# Patient Record
Sex: Male | Born: 1940 | Race: White | Hispanic: No | Smoking: Former smoker
Health system: Southern US, Community
[De-identification: ages and names within clinical notes are randomized; demographics above are authoritative.]

## PROBLEM LIST (undated history)

## (undated) DIAGNOSIS — N486 Induration penis plastica: Secondary | ICD-10-CM

## (undated) DIAGNOSIS — R9431 Abnormal electrocardiogram [ECG] [EKG]: Secondary | ICD-10-CM

## (undated) DIAGNOSIS — R634 Abnormal weight loss: Secondary | ICD-10-CM

## (undated) DIAGNOSIS — Z923 Personal history of irradiation: Secondary | ICD-10-CM

## (undated) DIAGNOSIS — R350 Frequency of micturition: Secondary | ICD-10-CM

## (undated) DIAGNOSIS — C61 Malignant neoplasm of prostate: Secondary | ICD-10-CM

## (undated) DIAGNOSIS — R351 Nocturia: Secondary | ICD-10-CM

## (undated) DIAGNOSIS — G4730 Sleep apnea, unspecified: Secondary | ICD-10-CM

## (undated) DIAGNOSIS — H269 Unspecified cataract: Secondary | ICD-10-CM

## (undated) DIAGNOSIS — R3912 Poor urinary stream: Secondary | ICD-10-CM

## (undated) HISTORY — PX: OTHER SURGICAL HISTORY: SHX169

## (undated) HISTORY — PX: UVULOPALATOPHARYNGOPLASTY: SHX827

## (undated) HISTORY — DX: Personal history of irradiation: Z92.3

## (undated) HISTORY — DX: Sleep apnea, unspecified: G47.30

## (undated) HISTORY — DX: Frequency of micturition: R35.0

## (undated) HISTORY — DX: Induration penis plastica: N48.6

## (undated) MED FILL — Dexamethasone Sodium Phosphate Inj 100 MG/10ML: INTRAMUSCULAR | Qty: 1 | Status: AC

## (undated) MED FILL — Fosaprepitant Dimeglumine For IV Infusion 150 MG (Base Eq): INTRAVENOUS | Qty: 5 | Status: AC

---

## 1990-10-18 HISTORY — PX: CHOLECYSTECTOMY: SHX55

## 2002-11-26 ENCOUNTER — Encounter: Payer: Self-pay | Admitting: Family Medicine

## 2002-11-26 ENCOUNTER — Ambulatory Visit (HOSPITAL_COMMUNITY): Admission: RE | Admit: 2002-11-26 | Discharge: 2002-11-26 | Payer: Self-pay | Admitting: Family Medicine

## 2010-12-09 ENCOUNTER — Inpatient Hospital Stay: Payer: Self-pay | Admitting: Internal Medicine

## 2011-11-03 ENCOUNTER — Encounter: Payer: Self-pay | Admitting: *Deleted

## 2011-11-03 NOTE — Progress Notes (Signed)
Jared Mullen is interested in Brachytherapy.  Married  Retired  [employer]

## 2011-11-04 ENCOUNTER — Encounter: Payer: Self-pay | Admitting: Radiation Oncology

## 2011-11-04 ENCOUNTER — Ambulatory Visit
Admission: RE | Admit: 2011-11-04 | Discharge: 2011-11-04 | Disposition: A | Discharge status: Home / Self Care | Payer: Medicare Other | Source: Ambulatory Visit | Attending: Radiation Oncology | Admitting: Radiation Oncology

## 2011-11-04 VITALS — BP 144/88 | HR 81 | Temp 96.8°F | Resp 18 | Ht 70.0 in | Wt 202.0 lb

## 2011-11-04 DIAGNOSIS — Z801 Family history of malignant neoplasm of trachea, bronchus and lung: Secondary | ICD-10-CM | POA: Insufficient documentation

## 2011-11-04 DIAGNOSIS — C61 Malignant neoplasm of prostate: Secondary | ICD-10-CM

## 2011-11-04 DIAGNOSIS — Z510 Encounter for antineoplastic radiation therapy: Secondary | ICD-10-CM | POA: Insufficient documentation

## 2011-11-04 DIAGNOSIS — Z8546 Personal history of malignant neoplasm of prostate: Secondary | ICD-10-CM

## 2011-11-04 DIAGNOSIS — Z803 Family history of malignant neoplasm of breast: Secondary | ICD-10-CM | POA: Insufficient documentation

## 2011-11-04 DIAGNOSIS — G4730 Sleep apnea, unspecified: Secondary | ICD-10-CM | POA: Insufficient documentation

## 2011-11-04 DIAGNOSIS — Z79899 Other long term (current) drug therapy: Secondary | ICD-10-CM | POA: Insufficient documentation

## 2011-11-04 NOTE — Progress Notes (Signed)
Please see the Nurse Progress Note in the MD Initial Consult Encounter for this patient. 

## 2011-11-04 NOTE — Progress Notes (Signed)
Patient presents to the clinic today accompanied by his wife for an initial consultation with Dr. Dayton Scrape reference prostate cancer. Patient is alert and oriented to person, place, and time. No distress noted. Steady gait noted. Pleasant affect noted. Patient reports intermittent sharp left lower abdominal pain 1 on a scale of 0-10 that radiated to his back on a daily basis. Patient has no other complaints. Patient denies nausea, vomiting, diarrhea, headache or double vision. Patient denies significant weight loss. Reported all findings to Dr. Dayton Scrape.    NKDA No pacemaker No radiation therapy in the past

## 2011-11-04 NOTE — Progress Notes (Signed)
Encounter addended by: Maryln Gottron, MD on: 11/04/2011 12:29 PM<BR>     Documentation filed: Normajean Glasgow VN

## 2011-11-04 NOTE — Progress Notes (Signed)
IPSS score of 6 noted.

## 2011-11-04 NOTE — Progress Notes (Signed)
[facility] Radiation Oncology NEW PATIENT EVALUATION  Name: Jared Mullen MRN: [id number]  Date: 11/04/2011  DOB: 10-19-1940  Status: outpatient   CC:   Jared Matar, MD  Dr. 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 male who is seen today for the courtesy of Dr. 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 His primary care physician, Dr. Sherrie Mullen, referred to Dr. Retta Mullen for further evaluation. Dr. Retta Mullen noted prominence of his right lobe. On 09/30/2011 he underwent ultrasound-guided biopsies. He was found to have adenocarcinoma with a Gleason score of 7 (3+4) involving 50% of one core from the right lateral base, 30% of one core from the right lateral mid gland and 5% of one core from the right mid gland. He also had Gleason 6 (3+3) involving 5% of one core from the right base. His gland volume was 32.5 cc and prosthetic length 4.13 cm. He is doing well from a GU and GI standpoint although he does report vague left abdominal discomfort on occasion. His I PSS score 6. He does report a weak urinary stream. He was given Jalyn samples the through Dr. Retta Mullen. He does have erectile dysfunction and a history of what sounds like Peyronie's disease.    PREVIOUS RADIATION THERAPY: No   PAST MEDICAL HISTORY:  has a past medical history of Sleep apnea; Libido, decreased; Urinary frequency; Urinary stream slowing; Urinary urgency; S/P biopsy (09/30/11 ); and Peyronie's disease.     PAST SURGICAL HISTORY:  Past Surgical History  Procedure Date  . Cholecystectomy   . Biospy 09/30/2011    TRUS/Bx Prostate - 5/12/biopsies positive for cnacer,  glandular size 32.5 cc     FAMILY HISTORY: family history includes Breast cancer in his sister; Cancer in his brother and sisters; Heart disease in his father; Kidney disease in his brother; and Lung cancer in his brother and sister. his father.a heart attack at age 48, in his mother died from "old age" at age 12.   SOCIAL HISTORY:  reports that he quit smoking about 31 years ago. His smoking use included Cigarettes. He has a 56 pack-year smoking history. He has never used smokeless tobacco. He reports that he does not drink alcohol or use illicit drugs. Married, 3 children. Retired Psychologist, occupational.   ALLERGIES: Review of patient's allergies indicates no known allergies.   MEDICATIONS:  Current Outpatient Prescriptions  Medication Sig Dispense Refill  . Dutasteride-Tamsulosin HCl (JALYN) 0.5-0.4 MG CAPS Take by mouth.          REVIEW OF SYSTEMS:  Pertinent items are noted in HPI.    PHYSICAL EXAM:  height is 5\' 10"  (1.778 m) and weight is 202 lb (91.627 kg). His oral temperature is 96.8 F (36 C). His blood pressure is 144/88 and his pulse is 81. His respiration is 18.   Head and neck examination: Grossly unremarkable. Nodes: Without palpable cervical or supraclavicular lymphadenopathy. Chest: Lungs clear. Back: Without spinal or CVA tenderness. Heart: Regular in rhythm. Abdomen: Without masses organomegaly. Genitalia: Grossly unremarkable. Rectal: The prostate gland is normal in size and there is prominence of his right lobe towards the base. There is no discrete induration or nodularity. Extremities:  Without edema. Neurologic examination: Grossly nonfocal.   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 I explained to the patient and his wife  that his prognosis is related to his stage, PSA level, and Gleason score. His stage and PSA level are favorable while his Gleason score of 7 is of intermediate favorability. We discussed surgery versus observation versus radiation therapy. His radiation therapy options include 5 weeks of external beam followed by seed implantation or 8 weeks of external beam/IMRT. He is not a good candidate for seed implantation alone in view of his multicentric Gleason 7 disease with heavy involvement of his right base. After a lengthy discussion he is most interested in 5 weeks of external beam radiation therapy followed by seed implantation. I think this would be a good choice for him. We discussed the potential acute and late toxicities of radiation therapy and he wishes to proceed as outlined. I'll need to have Dr. Retta Mullen placed 3 gold markers for prostate localization during his external beam. We will then get him set up for simulation/treatment planning. Lastly, he has not had colonoscopy, and I told him that he can have this performed before starting his radiation therapy. He will contact Dr. Sherrie Mullen, his primary care physician for scheduling of his colonoscopy.  PLAN: As above. Consent is signed today. He will return for simulation/treatment planning after placement of 3 gold seed markers by Dr. Retta Mullen. He can have colonoscopy a prior to initiation of radiation therapy. A CT arch study will be performed at the time of his initial simulation.   I spent 60 minutes minutes face to face with the patient and more than 50% of that time was spent in counseling and/or coordination of care.

## 2011-11-04 NOTE — Progress Notes (Signed)
Complete NUTRITION RISK SCREEN worksheet without concerns submitted to Zenovia Jarred, RD. Also, complete PATIENT MEASURE OF DISTRESS worksheet with a score of 0 turned into social work.

## 2011-11-05 ENCOUNTER — Telehealth: Payer: Self-pay | Admitting: *Deleted

## 2011-11-05 NOTE — Progress Notes (Signed)
Encounter addended by: Ardell Isaacs, RN on: 11/05/2011 11:42 AM<BR>     Documentation filed: Charges VN, Inpatient Patient Education

## 2011-11-11 LAB — HM COLONOSCOPY

## 2011-11-18 DIAGNOSIS — Z86010 Personal history of colonic polyps: Secondary | ICD-10-CM | POA: Insufficient documentation

## 2011-12-10 ENCOUNTER — Telehealth: Payer: Self-pay | Admitting: *Deleted

## 2011-12-10 NOTE — Telephone Encounter (Signed)
xxx

## 2011-12-13 ENCOUNTER — Ambulatory Visit
Admission: RE | Admit: 2011-12-13 | Discharge: 2011-12-13 | Disposition: A | Discharge status: Home / Self Care | Payer: Medicare Other | Source: Ambulatory Visit | Attending: Radiation Oncology | Admitting: Radiation Oncology

## 2011-12-13 DIAGNOSIS — C61 Malignant neoplasm of prostate: Secondary | ICD-10-CM

## 2011-12-13 NOTE — Telephone Encounter (Signed)
xxx

## 2011-12-13 NOTE — Progress Notes (Signed)
Met with patient to discuss RO billing.  Rad Tx: [id number] Extrl Beam  Attending Rad: Dr. Dayton Scrape   Dx: 185 malignant neoplasm of prostate

## 2011-12-13 NOTE — Progress Notes (Signed)
CT simulation/treatment planning note:  The patient underwent CT simulation/treatment planning today in the management of his course of the prostate. An alpha cradle was constructed for immobilization. A red rubber catheter was placed within the rectal vault. He was then catheterized and contrast instilled of the bladder/urethra. He was then scanned. I chose and isocenter within the center of the prostate. I performed a CT arch study is showing a prostate volume of 36.2 cc with a prosthetic length of 3.8 cm. His arch is open. This closely correlates with Dr. Lenoria Chime measurements. He is now ready for 3-D simulation. I prescribing 4,500 cGy in 25 sessions utilizing a 4 field technique, 15 MV photons. This was followed by a prostate seed implant to deliver a further 11,000 cGy with I-125 seeds. He'll undergo daily KV imaging, setting up to 3 gold seeds along with a weekly cone beam CT scan to assess his bladder filling.

## 2011-12-13 NOTE — Telephone Encounter (Signed)
xxxx 

## 2011-12-14 ENCOUNTER — Encounter: Payer: Self-pay | Admitting: Radiation Oncology

## 2011-12-14 NOTE — Progress Notes (Signed)
3-D simulation note:  The patient underwent 3-D simulation in the management of his carcinoma prostate. He was set up a 4 field technique. 4 separate multileaf collimators were designed to shield normal surrounding structures. Dose volume histograms were obtained for the prostate, seminal vesicles in addition to avoidance structures including the rectum, bladder and femoral heads. I'm prescribing 4500 cGy in 25 sessions utilizing 15 MV photons. He'll undergo daily image guidance setting up to 3 gold seeds and also a weekly cone beam CT scan to assess his bladder filling. He is to be treated with a comfortably full bladder.

## 2011-12-16 ENCOUNTER — Encounter: Payer: Self-pay | Admitting: *Deleted

## 2011-12-17 ENCOUNTER — Other Ambulatory Visit: Payer: Self-pay | Admitting: Urology

## 2011-12-20 ENCOUNTER — Ambulatory Visit
Admission: RE | Admit: 2011-12-20 | Discharge: 2011-12-20 | Disposition: A | Discharge status: Home / Self Care | Payer: Medicare Other | Source: Ambulatory Visit | Attending: Radiation Oncology | Admitting: Radiation Oncology

## 2011-12-20 ENCOUNTER — Encounter: Payer: Self-pay | Admitting: Radiation Oncology

## 2011-12-20 NOTE — Progress Notes (Signed)
Simulation verification note: The patient underwent simulation verification for treatment to his prostate.  His isocenter is in good position and the multileaf collimators contoured the treatment volume appropriately. 

## 2011-12-21 ENCOUNTER — Ambulatory Visit
Admission: RE | Admit: 2011-12-21 | Discharge: 2011-12-21 | Disposition: A | Discharge status: Home / Self Care | Payer: Medicare Other | Source: Ambulatory Visit | Attending: Radiation Oncology | Admitting: Radiation Oncology

## 2011-12-21 ENCOUNTER — Ambulatory Visit: Payer: Medicare Other

## 2011-12-22 ENCOUNTER — Ambulatory Visit
Admission: RE | Admit: 2011-12-22 | Discharge: 2011-12-22 | Disposition: A | Discharge status: Home / Self Care | Payer: Medicare Other | Source: Ambulatory Visit | Attending: Radiation Oncology | Admitting: Radiation Oncology

## 2011-12-22 DIAGNOSIS — C61 Malignant neoplasm of prostate: Secondary | ICD-10-CM

## 2011-12-22 NOTE — Progress Notes (Signed)
Received patient and his wife in the clinic today for post sim education with Sam, Charity fundraiser. Patient is alert and oriented to person, place, and time. No distress noted. Steady gait noted. Pleasant affect noted. Patient denies pain. Oriented patient to staff and routine of the clinic. Provided patient with RADIATION THERAPY AND YOU handbook then, reviewed pertinent information. Oriented patient to potential side effects and management. Provided patient with this writers business card and encouraged to call with needs. All questions answered. Patient verbalized understanding of all things reviewed.

## 2011-12-23 ENCOUNTER — Ambulatory Visit
Admission: RE | Admit: 2011-12-23 | Discharge: 2011-12-23 | Disposition: A | Discharge status: Home / Self Care | Payer: Medicare Other | Source: Ambulatory Visit | Attending: Radiation Oncology | Admitting: Radiation Oncology

## 2011-12-24 ENCOUNTER — Ambulatory Visit
Admission: RE | Admit: 2011-12-24 | Discharge: 2011-12-24 | Disposition: A | Discharge status: Home / Self Care | Payer: Medicare Other | Source: Ambulatory Visit | Attending: Radiation Oncology | Admitting: Radiation Oncology

## 2011-12-27 ENCOUNTER — Ambulatory Visit
Admission: RE | Admit: 2011-12-27 | Discharge: 2011-12-27 | Disposition: A | Discharge status: Home / Self Care | Payer: Medicare Other | Source: Ambulatory Visit | Attending: Radiation Oncology | Admitting: Radiation Oncology

## 2011-12-27 ENCOUNTER — Encounter: Payer: Self-pay | Admitting: Radiation Oncology

## 2011-12-27 VITALS — BP 154/93 | HR 68 | Resp 18 | Wt 197.3 lb

## 2011-12-27 DIAGNOSIS — C61 Malignant neoplasm of prostate: Secondary | ICD-10-CM

## 2011-12-27 NOTE — Progress Notes (Signed)
Patient presents to the clinic today for under treat visit with Dr. Dayton Scrape. Patient is alert and oriented to person, place, and time. No distress noted. Steady gait noted. Pleasant affect noted. Patient denies pain at this time. Patient denies hematuria. Patient denies nocturia. Patient denies burning with urination. Patient has no complaints at this time. Reported all findings to Dr. Dayton Scrape.

## 2011-12-27 NOTE — Progress Notes (Signed)
Weekly Management Note:  Site:Prostate Current Dose:  900  cGy Projected Dose: 4500  cGy  Narrative: The patient is seen today for routine under treatment assessment. CBCT/MVCT images/port films were reviewed. The chart was reviewed.   Bladder filling is excellent. No GU or GI difficulties.  Physical Examination:  Filed Vitals:   12/27/11 1721  BP: 154/93  Pulse: 68  Resp: 18  .  Weight: 197 lb 4.8 oz (89.495 kg). No change.  Impression: Tolerating radiation therapy well.  Plan: Continue radiation therapy as planned.

## 2011-12-28 ENCOUNTER — Ambulatory Visit
Admission: RE | Admit: 2011-12-28 | Discharge: 2011-12-28 | Disposition: A | Discharge status: Home / Self Care | Payer: Medicare Other | Source: Ambulatory Visit | Attending: Radiation Oncology | Admitting: Radiation Oncology

## 2011-12-29 ENCOUNTER — Ambulatory Visit
Admission: RE | Admit: 2011-12-29 | Discharge: 2011-12-29 | Disposition: A | Discharge status: Home / Self Care | Payer: Medicare Other | Source: Ambulatory Visit | Attending: Radiation Oncology | Admitting: Radiation Oncology

## 2011-12-29 LAB — HM COLONOSCOPY

## 2011-12-30 ENCOUNTER — Ambulatory Visit
Admission: RE | Admit: 2011-12-30 | Discharge: 2011-12-30 | Disposition: A | Discharge status: Home / Self Care | Payer: Medicare Other | Source: Ambulatory Visit | Attending: Radiation Oncology | Admitting: Radiation Oncology

## 2011-12-31 ENCOUNTER — Ambulatory Visit
Admission: RE | Admit: 2011-12-31 | Discharge: 2011-12-31 | Disposition: A | Discharge status: Home / Self Care | Payer: Medicare Other | Source: Ambulatory Visit | Attending: Radiation Oncology | Admitting: Radiation Oncology

## 2012-01-03 ENCOUNTER — Ambulatory Visit
Admission: RE | Admit: 2012-01-03 | Discharge: 2012-01-03 | Disposition: A | Discharge status: Home / Self Care | Payer: Medicare Other | Source: Ambulatory Visit | Attending: Radiation Oncology | Admitting: Radiation Oncology

## 2012-01-03 ENCOUNTER — Encounter: Payer: Self-pay | Admitting: Radiation Oncology

## 2012-01-03 VITALS — BP 144/92 | HR 70 | Resp 18 | Wt 197.7 lb

## 2012-01-03 DIAGNOSIS — C61 Malignant neoplasm of prostate: Secondary | ICD-10-CM

## 2012-01-03 NOTE — Progress Notes (Signed)
Patient presents to the clinic today for under treat visit with Dr. Dayton Scrape. Patient is alert and oriented to person, place, and time. No distress noted. Steady gait noted. Pleasant affect noted. Patient denies pain at this time. Patient reports that last Thursday and Friday night "were rough." patient reports he had the urge to void and could not. Also, patient reports that began burning with urination. Patient reports he is voiding without difficulty now but the burning has yet to resolve. Patient denies nausea, vomiting, headache, dizziness or diarrhea. Reported all findings to Dr. Dayton Scrape.

## 2012-01-03 NOTE — Progress Notes (Signed)
Weekly Management Note:  Site:Prostate Current Dose:  1800  cGy Projected Dose: 4500  cGy  Narrative: The patient is seen today for routine under treatment assessment. CBCT/MVCT images/port films were reviewed. The chart was reviewed.   Bladder filling is excellent. He had dysuria this past Thursday night with some slowing of the stream, but he is now back to his baseline. No GI problems. His implant is scheduled for April 24.  Physical Examination:  Filed Vitals:   01/03/12 1712  BP: 144/92  Pulse: 70  Resp: 18  .  Weight: 197 lb 11.2 oz (89.676 kg). No change.  Impression: Tolerating radiation therapy well.  Plan: Continue radiation therapy as planned.

## 2012-01-04 ENCOUNTER — Ambulatory Visit
Admission: RE | Admit: 2012-01-04 | Discharge: 2012-01-04 | Disposition: A | Discharge status: Home / Self Care | Payer: Medicare Other | Source: Ambulatory Visit | Attending: Radiation Oncology | Admitting: Radiation Oncology

## 2012-01-05 ENCOUNTER — Ambulatory Visit
Admission: RE | Admit: 2012-01-05 | Discharge: 2012-01-05 | Disposition: A | Discharge status: Home / Self Care | Payer: Medicare Other | Source: Ambulatory Visit | Attending: Radiation Oncology | Admitting: Radiation Oncology

## 2012-01-06 ENCOUNTER — Ambulatory Visit
Admission: RE | Admit: 2012-01-06 | Discharge: 2012-01-06 | Disposition: A | Discharge status: Home / Self Care | Payer: Medicare Other | Source: Ambulatory Visit | Attending: Radiation Oncology | Admitting: Radiation Oncology

## 2012-01-07 ENCOUNTER — Other Ambulatory Visit: Payer: Self-pay

## 2012-01-07 ENCOUNTER — Other Ambulatory Visit: Payer: Self-pay | Admitting: Urology

## 2012-01-07 ENCOUNTER — Ambulatory Visit (HOSPITAL_BASED_OUTPATIENT_CLINIC_OR_DEPARTMENT_OTHER)
Admission: RE | Admit: 2012-01-07 | Discharge: 2012-01-07 | Disposition: A | Discharge status: Home / Self Care | Payer: Medicare Other | Source: Ambulatory Visit | Attending: Urology | Admitting: Urology

## 2012-01-07 ENCOUNTER — Ambulatory Visit
Admission: RE | Admit: 2012-01-07 | Discharge: 2012-01-07 | Disposition: A | Discharge status: Home / Self Care | Payer: Medicare Other | Source: Ambulatory Visit | Attending: Radiation Oncology | Admitting: Radiation Oncology

## 2012-01-07 ENCOUNTER — Ambulatory Visit
Admission: RE | Admit: 2012-01-07 | Discharge: 2012-01-07 | Disposition: A | Discharge status: Home / Self Care | Payer: Medicare Other | Source: Ambulatory Visit | Attending: Urology | Admitting: Urology

## 2012-01-07 DIAGNOSIS — C61 Malignant neoplasm of prostate: Secondary | ICD-10-CM | POA: Insufficient documentation

## 2012-01-07 DIAGNOSIS — Z01818 Encounter for other preprocedural examination: Secondary | ICD-10-CM

## 2012-01-10 ENCOUNTER — Ambulatory Visit
Admission: RE | Admit: 2012-01-10 | Discharge: 2012-01-10 | Disposition: A | Discharge status: Home / Self Care | Payer: Medicare Other | Source: Ambulatory Visit | Attending: Radiation Oncology | Admitting: Radiation Oncology

## 2012-01-10 ENCOUNTER — Encounter: Payer: Self-pay | Admitting: Radiation Oncology

## 2012-01-10 VITALS — BP 150/89 | HR 64 | Resp 18 | Wt 198.2 lb

## 2012-01-10 DIAGNOSIS — C61 Malignant neoplasm of prostate: Secondary | ICD-10-CM

## 2012-01-10 NOTE — Progress Notes (Signed)
Patient presents to the clinic today unaccompanied for under treat visit with Dr. Dayton Scrape. Patient is alert and oriented to person, place, and time. No distress noted. Steady gait noted. Pleasant affect noted. Patient denies pain at this time. Patient denies burning with urination or hematuria. Patient reports he gets up on average no more than once to void. Patient questions if he empties his bladder completely. Patient denies nausea, vomiting, or headache. Patient reports loose bowel movement about twice per day. Reported all findings to Dr. Dayton Scrape.

## 2012-01-10 NOTE — Progress Notes (Signed)
Weekly Management Note:  Site:Prostate Current Dose:  2700  cGy Projected Dose: 4500  cGy  Narrative: The patient is seen today for routine under treatment assessment. CBCT/MVCT images/port films were reviewed. The chart was reviewed. Good  bladder filling today. No GU or GI difficulties although he wonders if he is completely emptying his bladder. His pretreatment I PSS score was 6. He only has nocturia x1.  Physical Examination:  Filed Vitals:   01/10/12 1650  BP: 150/89  Pulse: 64  Resp: 18  .  Weight: 198 lb 3.2 oz (89.903 kg). No change.  Impression: Tolerating radiation therapy well.  Plan: Continue radiation therapy as planned. Seed implant boost on April 24.

## 2012-01-11 ENCOUNTER — Ambulatory Visit
Admission: RE | Admit: 2012-01-11 | Discharge: 2012-01-11 | Disposition: A | Discharge status: Home / Self Care | Payer: Medicare Other | Source: Ambulatory Visit | Attending: Radiation Oncology | Admitting: Radiation Oncology

## 2012-01-12 ENCOUNTER — Ambulatory Visit
Admission: RE | Admit: 2012-01-12 | Discharge: 2012-01-12 | Disposition: A | Discharge status: Home / Self Care | Payer: Medicare Other | Source: Ambulatory Visit | Attending: Radiation Oncology | Admitting: Radiation Oncology

## 2012-01-13 ENCOUNTER — Ambulatory Visit
Admission: RE | Admit: 2012-01-13 | Discharge: 2012-01-13 | Disposition: A | Discharge status: Home / Self Care | Payer: Medicare Other | Source: Ambulatory Visit | Attending: Radiation Oncology | Admitting: Radiation Oncology

## 2012-01-14 ENCOUNTER — Ambulatory Visit
Admission: RE | Admit: 2012-01-14 | Discharge: 2012-01-14 | Disposition: A | Discharge status: Home / Self Care | Payer: Medicare Other | Source: Ambulatory Visit | Attending: Radiation Oncology | Admitting: Radiation Oncology

## 2012-01-17 ENCOUNTER — Ambulatory Visit
Admission: RE | Admit: 2012-01-17 | Discharge: 2012-01-17 | Disposition: A | Discharge status: Home / Self Care | Payer: Medicare Other | Source: Ambulatory Visit | Attending: Radiation Oncology | Admitting: Radiation Oncology

## 2012-01-17 ENCOUNTER — Encounter: Payer: Self-pay | Admitting: Radiation Oncology

## 2012-01-17 DIAGNOSIS — C61 Malignant neoplasm of prostate: Secondary | ICD-10-CM

## 2012-01-17 NOTE — Progress Notes (Signed)
HERE TODAY FOR PUT.  HAS SOME BURNING WITH URINATION BUT NOT ENOUGH TO NEED MEDICATION.

## 2012-01-17 NOTE — Progress Notes (Signed)
   Weekly Management Note, prostate cancer Current Dose:  3600 cGy  Projected Dose:  4500 cGy plus a seed boost  Narrative:  The patient presents for routine under treatment assessment.  CBCT/MVCT images/Port film x-rays were reviewed.  The chart was checked. He is doing well. He has a little bit of dysuria. He has nocturia once per night.  Physical Findings: Weight:  . In no acute distress, well-appearing  Impression:  The patient is tolerating radiotherapy.  Plan:  Continue radiotherapy as planned.

## 2012-01-18 ENCOUNTER — Ambulatory Visit
Admission: RE | Admit: 2012-01-18 | Discharge: 2012-01-18 | Disposition: A | Discharge status: Home / Self Care | Payer: Medicare Other | Source: Ambulatory Visit | Attending: Radiation Oncology | Admitting: Radiation Oncology

## 2012-01-19 ENCOUNTER — Ambulatory Visit
Admission: RE | Admit: 2012-01-19 | Discharge: 2012-01-19 | Disposition: A | Discharge status: Home / Self Care | Payer: Medicare Other | Source: Ambulatory Visit | Attending: Radiation Oncology | Admitting: Radiation Oncology

## 2012-01-20 ENCOUNTER — Ambulatory Visit
Admission: RE | Admit: 2012-01-20 | Discharge: 2012-01-20 | Disposition: A | Discharge status: Home / Self Care | Payer: Medicare Other | Source: Ambulatory Visit | Attending: Radiation Oncology | Admitting: Radiation Oncology

## 2012-01-21 ENCOUNTER — Ambulatory Visit
Admission: RE | Admit: 2012-01-21 | Discharge: 2012-01-21 | Disposition: A | Discharge status: Home / Self Care | Payer: Medicare Other | Source: Ambulatory Visit | Attending: Radiation Oncology | Admitting: Radiation Oncology

## 2012-01-24 ENCOUNTER — Encounter: Payer: Self-pay | Admitting: Radiation Oncology

## 2012-01-24 ENCOUNTER — Ambulatory Visit
Admission: RE | Admit: 2012-01-24 | Discharge: 2012-01-24 | Disposition: A | Discharge status: Home / Self Care | Payer: Medicare Other | Source: Ambulatory Visit | Attending: Radiation Oncology | Admitting: Radiation Oncology

## 2012-01-24 VITALS — BP 161/90 | HR 58 | Resp 18 | Wt 198.4 lb

## 2012-01-24 DIAGNOSIS — C61 Malignant neoplasm of prostate: Secondary | ICD-10-CM

## 2012-01-24 NOTE — Progress Notes (Signed)
Patient presents to the clinic today unaccompanied for under treat visit with Dr. Dayton Scrape. Patient is alert and oriented to person, place, and time. No distress noted. Steady gait noted. Pleasant affect noted. Patient denies pain at this time. Patient reports that he is bloated. Patient complains of gas. Patient reports he feels tired now after his daily walks. Patient reports that he feels like his hand are swollen. Patient reports that Friday night he got up five times to void but last night none at all. Patient denies seeing blood in his urine. Patient reports his stools are loose but, denies having diarrhea. Reported all findings to Dr. Dayton Scrape.

## 2012-01-24 NOTE — Progress Notes (Signed)
Weekly Management Note:  Site:Prostate Current Dose:  4500  cGy Projected Dose: 4500  cGy  Narrative: The patient is seen today for routine under treatment assessment. CBCT/MVCT images/port films were reviewed. The chart was reviewed.   Bladder filling satisfactory. Doing well from a GU and GI standpoint.  Physical Examination:  Filed Vitals:   01/24/12 1757  BP: 161/90  Pulse: 58  Resp: 18  .  Weight: 198 lb 6.4 oz (89.994 kg). No change.  Impression: Tolerated well.   Plan: Prostate seed implant boost on April 24  with Dr. Retta Diones.

## 2012-01-24 NOTE — Progress Notes (Signed)
[facility] Radiation Oncology End of Treatment Note  Name:Jared Mullen  Date: 01/24/2012 ZOX:[id number] DOB:[birth year]   Status:outpatient    CC: Chelsea Aus, MD, MD    REFERRING PHYSICIAN: Dr. 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                        PLAN: Prostate seed implant boost with Dr. Retta Diones on April 24.

## 2012-02-01 ENCOUNTER — Telehealth: Payer: Self-pay | Admitting: *Deleted

## 2012-02-01 NOTE — Telephone Encounter (Signed)
Called patient to remind of appt. For 02-02-12, lvm for a return call.

## 2012-02-02 LAB — COMPREHENSIVE METABOLIC PANEL
BUN: 13 mg/dL (ref 6–23)
CO2: 31 mEq/L (ref 19–32)
Chloride: 104 mEq/L (ref 96–112)
Creatinine, Ser: 1.02 mg/dL (ref 0.50–1.35)
GFR calc non Af Amer: 71 mL/min — ABNORMAL LOW (ref >90)
Glucose, Bld: 88 mg/dL (ref 70–99)
Total Bilirubin: 0.8 mg/dL (ref 0.3–1.2)

## 2012-02-02 LAB — CBC
HCT: 43.5 % (ref 39.0–52.0)
Hemoglobin: 14.7 g/dL (ref 13.0–17.0)
MCV: 90.2 fL (ref 78.0–100.0)
RBC: 4.82 MIL/uL (ref 4.22–5.81)
WBC: 4.7 10*3/uL (ref 4.0–10.5)

## 2012-02-02 LAB — PROTIME-INR
INR: 1.02 (ref 0.00–1.49)
Prothrombin Time: 13.6 seconds (ref 11.6–15.2)

## 2012-02-08 ENCOUNTER — Encounter (HOSPITAL_BASED_OUTPATIENT_CLINIC_OR_DEPARTMENT_OTHER): Payer: Self-pay | Admitting: *Deleted

## 2012-02-08 ENCOUNTER — Telehealth: Payer: Self-pay | Admitting: *Deleted

## 2012-02-08 NOTE — Progress Notes (Signed)
NPO AFTER MN. ARRIVES AT 0915. CURRENT LAB RESULTS IN EPIC. CURRENT CXR AND EKG W/ CHART AND IN EPIC. WILL DO ONE FLEET ENEMA AM OF SURG.

## 2012-02-08 NOTE — H&P (Signed)
Urology History and Physical Exam  ZO:[id number] cancer  HPI: 71 year old male who presents now for brachytherapy to complete combination radiotherapy. His history is outlined below:  He originally presented in November 2012, referred by Dr. Sherrie Mustache for elevation of his PSA, found on his insurance physical. He had slight assymmetry of his prostate on exam, with the right lobe being more prominent than the left. He underwent TRUS/Bx on 09/30/2011. Glandular size was 32.5 cc. 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 Chipper Herb. He completed 25 treatments of external beam radiotherapy on 01/24/2012. He received 4500 cGy.   PMH: Past Medical History  Diagnosis Date  . Urinary frequency   . Peyronie's disease   . Prostate cancer dx 12-  2012  . Nocturia   . Weak urinary stream   . Cataract immature right eye     PSH: Past Surgical History  Procedure Date  . Biospy 09/30/2011- DONE IN OFFICE    TRUS/Bx Prostate - 5/12/biopsies positive for cnacer, glandular size 32.5 cc  . Cholecystectomy 1992    Allergies: No Known Allergies  Medications: No prescriptions prior to admission     Social History: History   Social History  . Marital Status: Married    Spouse Name: N/A    Number of Children: N/A  . Years of Education: N/A   Occupational History  . Not on file.   Social History Main Topics  . Smoking status: Former Smoker -- 2.0 packs/day for 28 years    Types: Cigarettes    Quit date: 10/18/1980  . Smokeless tobacco: Never Used  . Alcohol Use: No  . Drug Use: No  . Sexually Active:      Low libido   Other Topics Concern  . Not  on file   Social History Narrative  . No narrative on file    Family History: Family History  Problem Relation Age of Onset  . Heart disease Father   . Breast cancer Sister   . Cancer Sister     lung  . Lung cancer Brother   . Cancer Brother     bladder  . Lung cancer Sister   . Cancer Sister     breast  . Kidney disease Brother     Review of Systems Genitourinary, constitutional, skin, eye, otolaryngeal, hematologic/lymphatic, cardiovascular, pulmonary, endocrine, musculoskeletal, gastrointestinal, neurological and psychiatric system(s) were reviewed and pertinent findings if present are noted.  Genitourinary: urinary frequency, feelings of urinary urgency, nocturia, decreased libido and penile curvature, but no erectile dysfunction.  Gastrointestinal: nausea and abdominal pain.  Constitutional: night sweats.  Integumentary: skin rash/lesion and pruritus.  Musculoskeletal: back pain.    Physical Exam: @VITALS2 @ Constitutional: Well nourished and well developed . No acute distress.  ENT:. The ears and nose are normal in appearance.  Neck: The appearance of the neck is normal and no neck mass is present.  Pulmonary: No respiratory distress and normal respiratory rhythm and effort.  Cardiovascular: Heart rate and rhythm are normal . No peripheral edema.  Abdomen: The abdomen is soft and nontender. No masses are palpated. No CVA tenderness. No hernias  are palpable. No hepatosplenomegaly noted.  Rectal: Rectal exam demonstrates normal sphincter tone, the anus is normal on inspection., no tenderness and no masses. Estimated prostate size is 2+. Normal rectal tone, no rectal masses, prostate is smooth, symmetric and non-tender.  There is slight asymmetry of the prostate, with the right lobe being slightly larger than the left. There is no discrete nodularity, however. The prostate has no nodularity and is not tender. The left seminal vesicle is nonpalpable. The right seminal vesicle  is nonpalpable. The perineum is normal on inspection.  Genitourinary: Examination of the penis demonstrates no discharge, no masses, no lesions and a normal meatus. The penis is uncircumcised. The scrotum is without lesions. The right epididymis is palpably normal and non-tender. The left epididymis is palpably normal and non-tender. The right testis is palpably normal, non-tender and without masses. The left testis is normal, non-tender and without masses.   Studies:  No results found for this basename: HGB:2,WBC:2,PLT:2 in the last 72 hours  No results found for this basename: NA:2,K:2,CL:2,CO2:2,BUN:2,CREATININE:2,CALCIUM:2,MAGNESIUM:2,GFRNONAA:2,GFRAA:2 in the last 72 hours   No results found for this basename: PT:2,INR:2,APTT:2 in the last 72 hours   No components found with this basename: ABG:2    Assessment: adenocarcinoma prostate, clinical stage T2a, Gleason 3+4. He presents for brachytherapy to complete combination radiotherapy.  Plan: I-125 brachytherapy

## 2012-02-08 NOTE — Telephone Encounter (Signed)
CALLED PATIENT TO REMIND OF PROCEDURE FOR 02-09-12, LVM FOR A RETURN CALL

## 2012-02-09 ENCOUNTER — Ambulatory Visit (HOSPITAL_COMMUNITY): Payer: Medicare Other

## 2012-02-09 ENCOUNTER — Encounter (HOSPITAL_BASED_OUTPATIENT_CLINIC_OR_DEPARTMENT_OTHER): Payer: Self-pay | Admitting: Anesthesiology

## 2012-02-09 ENCOUNTER — Encounter (HOSPITAL_BASED_OUTPATIENT_CLINIC_OR_DEPARTMENT_OTHER)
Admission: RE | Disposition: A | Discharge status: Home / Self Care | Payer: Self-pay | Source: Ambulatory Visit | Attending: Urology

## 2012-02-09 ENCOUNTER — Ambulatory Visit (HOSPITAL_BASED_OUTPATIENT_CLINIC_OR_DEPARTMENT_OTHER)
Admission: RE | Admit: 2012-02-09 | Discharge: 2012-02-09 | Disposition: A | Discharge status: Home / Self Care | Payer: Medicare Other | Source: Ambulatory Visit | Attending: Urology | Admitting: Urology

## 2012-02-09 ENCOUNTER — Encounter: Payer: Self-pay | Admitting: Radiation Oncology

## 2012-02-09 ENCOUNTER — Ambulatory Visit (HOSPITAL_BASED_OUTPATIENT_CLINIC_OR_DEPARTMENT_OTHER): Payer: Medicare Other | Admitting: Anesthesiology

## 2012-02-09 ENCOUNTER — Encounter (HOSPITAL_BASED_OUTPATIENT_CLINIC_OR_DEPARTMENT_OTHER): Payer: Self-pay | Admitting: *Deleted

## 2012-02-09 DIAGNOSIS — C61 Malignant neoplasm of prostate: Secondary | ICD-10-CM

## 2012-02-09 HISTORY — DX: Nocturia: R35.1

## 2012-02-09 HISTORY — DX: Unspecified cataract: H26.9

## 2012-02-09 HISTORY — DX: Malignant neoplasm of prostate: C61

## 2012-02-09 HISTORY — PX: RADIOACTIVE SEED IMPLANT: SHX5150

## 2012-02-09 HISTORY — DX: Poor urinary stream: R39.12

## 2012-02-09 SURGERY — INSERTION, RADIATION SOURCE, PROSTATE
Anesthesia: General | Site: Prostate | Wound class: Clean Contaminated

## 2012-02-09 MED ORDER — HYDROCODONE-ACETAMINOPHEN 5-500 MG PO CAPS: 1.0000 | ORAL_CAPSULE | ORAL | Status: AC | PRN

## 2012-02-09 MED ORDER — LACTATED RINGERS IV SOLN: INTRAVENOUS | Status: DC

## 2012-02-09 MED ORDER — PROPOFOL 10 MG/ML IV EMUL
INTRAVENOUS | Status: DC | PRN
  Administered 2012-02-09: 160 mg via INTRAVENOUS
  Administered 2012-02-09: 40 mg via INTRAVENOUS

## 2012-02-09 MED ORDER — CIPROFLOXACIN IN D5W 400 MG/200ML IV SOLN
400.0000 mg | INTRAVENOUS | Status: AC
  Administered 2012-02-09: 400 mg via INTRAVENOUS

## 2012-02-09 MED ORDER — LIDOCAINE HCL (CARDIAC) 20 MG/ML IV SOLN
INTRAVENOUS | Status: DC | PRN
  Administered 2012-02-09: 80 mg via INTRAVENOUS

## 2012-02-09 MED ORDER — CIPROFLOXACIN HCL 250 MG PO TABS: 250.0000 mg | ORAL_TABLET | Freq: Two times a day (BID) | ORAL | Status: AC

## 2012-02-09 MED ORDER — LACTATED RINGERS IV SOLN
INTRAVENOUS | Status: DC
  Administered 2012-02-09 (×2): via INTRAVENOUS

## 2012-02-09 MED ORDER — ONDANSETRON HCL 4 MG/2ML IJ SOLN
INTRAMUSCULAR | Status: DC | PRN
  Administered 2012-02-09: 4 mg via INTRAVENOUS

## 2012-02-09 MED ORDER — DEXAMETHASONE SODIUM PHOSPHATE 4 MG/ML IJ SOLN
INTRAMUSCULAR | Status: DC | PRN
  Administered 2012-02-09: 10 mg via INTRAVENOUS

## 2012-02-09 MED ORDER — FLEET ENEMA 7-19 GM/118ML RE ENEM: 1.0000 | ENEMA | Freq: Once | RECTAL | Status: DC

## 2012-02-09 MED ORDER — PROMETHAZINE HCL 25 MG/ML IJ SOLN: 6.2500 mg | INTRAMUSCULAR | Status: DC | PRN

## 2012-02-09 MED ORDER — MEPERIDINE HCL 25 MG/ML IJ SOLN: 6.2500 mg | INTRAMUSCULAR | Status: DC | PRN

## 2012-02-09 MED ORDER — STERILE WATER FOR IRRIGATION IR SOLN
Status: DC | PRN
  Administered 2012-02-09: 3000 mL

## 2012-02-09 MED ORDER — HYDROCODONE-ACETAMINOPHEN 5-325 MG PO TABS
1.0000 | ORAL_TABLET | ORAL | Status: DC | PRN
  Administered 2012-02-09: 1 via ORAL

## 2012-02-09 MED ORDER — CEFAZOLIN SODIUM 1-5 GM-% IV SOLN
INTRAVENOUS | Status: DC | PRN
  Administered 2012-02-09: 2 g via INTRAVENOUS

## 2012-02-09 MED ORDER — MIDAZOLAM HCL 5 MG/5ML IJ SOLN
INTRAMUSCULAR | Status: DC | PRN
  Administered 2012-02-09: 1 mg via INTRAVENOUS

## 2012-02-09 MED ORDER — IOHEXOL 350 MG/ML SOLN
INTRAVENOUS | Status: DC | PRN
  Administered 2012-02-09: 7 mL via INTRAVENOUS

## 2012-02-09 MED ORDER — FENTANYL CITRATE 0.05 MG/ML IJ SOLN: 25.0000 ug | INTRAMUSCULAR | Status: DC | PRN

## 2012-02-09 MED ORDER — FENTANYL CITRATE 0.05 MG/ML IJ SOLN
INTRAMUSCULAR | Status: DC | PRN
  Administered 2012-02-09: 100 ug via INTRAVENOUS

## 2012-02-09 SURGICAL SUPPLY — 23 items
BAG URINE DRAINAGE (UROLOGICAL SUPPLIES) ×2 IMPLANT
BLADE SURG ROTATE 9660 (MISCELLANEOUS) ×2 IMPLANT
CATH FOLEY 2WAY SLVR  5CC 16FR (CATHETERS) ×2
CATH FOLEY 2WAY SLVR 5CC 16FR (CATHETERS) ×2 IMPLANT
CATH ROBINSON RED A/P 20FR (CATHETERS) ×2 IMPLANT
CLOTH BEACON ORANGE TIMEOUT ST (SAFETY) ×2 IMPLANT
COVER MAYO STAND STRL (DRAPES) ×2 IMPLANT
COVER TABLE BACK 60X90 (DRAPES) ×2 IMPLANT
DRSG TEGADERM 4X4.75 (GAUZE/BANDAGES/DRESSINGS) ×2 IMPLANT
DRSG TEGADERM 8X12 (GAUZE/BANDAGES/DRESSINGS) ×2 IMPLANT
GLOVE BIO SURGEON STRL SZ7.5 (GLOVE) ×8 IMPLANT
GLOVE BIO SURGEON STRL SZ8 (GLOVE) ×4 IMPLANT
GLOVE ECLIPSE 8.0 STRL XLNG CF (GLOVE) IMPLANT
GOWN STRL REIN XL XLG (GOWN DISPOSABLE) ×2 IMPLANT
GOWN XL W/COTTON TOWEL STD (GOWNS) ×2 IMPLANT
HOLDER FOLEY CATH W/STRAP (MISCELLANEOUS) ×2 IMPLANT
PACK CYSTOSCOPY (CUSTOM PROCEDURE TRAY) ×2 IMPLANT
SPONGE GAUZE 4X4 12PLY (GAUZE/BANDAGES/DRESSINGS) ×2 IMPLANT
SYRINGE 10CC LL (SYRINGE) ×2 IMPLANT
UNDERPAD 30X30 INCONTINENT (UNDERPADS AND DIAPERS) ×4 IMPLANT
WATER STERILE IRR 3000ML UROMA (IV SOLUTION) ×2 IMPLANT
WATER STERILE IRR 500ML POUR (IV SOLUTION) ×2 IMPLANT
nucletron select seed I-125 ×2 IMPLANT

## 2012-02-09 NOTE — Addendum Note (Signed)
Addendum  created 02/09/12 1443 by Ceili Boshers T Agustus Mane, CRNA   Modules edited:Anesthesia Medication Administration    

## 2012-02-09 NOTE — Progress Notes (Signed)
[facility] Radiation Oncology End of Treatment Note  Name:Enos Beutler  Date: 02/09/2012 ZOX:[id number] DOB:July 22, 1941   Status:outpatient    CC: Chelsea Aus, MD, MD    REFERRING PHYSICIAN: Dr. 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 Seed implant 02/09/2012                          SITE/DOSE:  Prostate/seminal vesicles, 4500 cGy 25 sessions external beam with 15 MV photons 4 field technique. 11,000 cGy I-125 seed boost utilizing 54 seeds and 24 active needles. Individual seed activity  0.338 mCi per seed for a total implant activity of 18.25 mCi.                                      NARRATIVE: The patient appears to have undergone a successful Nucletron Selectron boost seed  implant with Dr. Retta Diones.                           PLAN: Routine followup in one month. Patient instructed to call if questions or worsening complaints in interim.

## 2012-02-09 NOTE — Transfer of Care (Signed)
Immediate Anesthesia Transfer of Care Note  Patient: Jared Mullen  Procedure(s) Performed: Procedure(s) (LRB): RADIOACTIVE SEED IMPLANT (N/A)  Patient Location: PACU  Anesthesia Type: General  Level of Consciousness: awake, alert  and oriented  Airway & Oxygen Therapy: Patient Spontanous Breathing and Patient connected to nasal cannula oxygen  Post-op Assessment: Report given to PACU RN  Post vital signs: Reviewed and stable  Complications: No apparent anesthesia complications

## 2012-02-09 NOTE — Op Note (Signed)
Preoperative diagnosis:  Postoperative diagnosis:  Procedure: I-125 brachytherapy, cystoscopy  Surgeon: Zen Cedillos  Radiation oncologist:  Anesthesia: Gen. with LMA  Complications: None   Indications:71 year old male who presents now for brachytherapy to complete combination radiotherapy. His history is outlined below:  He originally presented in November 2012, referred by Dr. Sherrie Mustache for elevation of his PSA, found on his insurance physical. He had slight assymmetry of his prostate on exam, with the right lobe being more prominent than the left. He underwent TRUS/Bx on 09/30/2011. Glandular size was 32.5 cc. 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 Chipper Herb. He completed 25 treatments of external beam radiotherapy on 01/24/2012. He received 4500 cGy.  He presents now for brachytherapy    Description of procedure: The patient was identified in the holding area, and received preoperative IV antibiotics. He was taken to the operating room where general anesthetic was administered using the LMA. He was placed in the dorsolithotomy position. Genitalia and perineum were prepped and draped. Foley catheter was placed within the bladder, with the balloon filled with contrast. Rectal tube was placed, and the transrectal ultrasound probe placed by radiation oncology. The brachytherapy template was placed as well.  The radiation oncologist performed after treatment planning, with dilatation of the urethra and the rectum. Following treatment planning, I was called to the operating room.  According to the treatment plan, I placed needles within the perineum, with  the Nucletron device delivering the seeds. A total of needles with Seeds were placed according to the treatment plan. Please see the sonographic planning for specifics.  Following placement of all seeds, the rectal tube, transrectal probe and Foley catheter were removed. Flexible cystoscopy revealed no evidence of injury to the bladder, urethra, or any iodine seeds present within the urinary tract.  The cystoscope was removed, and a 16 French Foley catheter was placed and hooked to dependent drainage.  The patient tolerated procedure well and was returned to the PACU in stable condition.

## 2012-02-09 NOTE — Addendum Note (Signed)
Addendum  created 02/09/12 1443 by Briant Sites, CRNA   Modules edited:Anesthesia Medication Administration

## 2012-02-09 NOTE — Anesthesia Postprocedure Evaluation (Signed)
  Anesthesia Post-op Note  Patient: Jared Mullen  Procedure(s) Performed: Procedure(s) (LRB): RADIOACTIVE SEED IMPLANT (N/A)  Patient Location: PACU  Anesthesia Type: General  Level of Consciousness: awake and alert   Airway and Oxygen Therapy: Patient Spontanous Breathing  Post-op Pain: mild  Post-op Assessment: Post-op Vital signs reviewed, Patient's Cardiovascular Status Stable, Respiratory Function Stable, Patent Airway and No signs of Nausea or vomiting  Post-op Vital Signs: stable  Complications: No apparent anesthesia complications

## 2012-02-09 NOTE — Progress Notes (Signed)
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  PROCEDURE: Insertion of radioactive I-125 seeds into the prostate gland.  RADIATION DOSE: 11,000 Gy,boost therapy.  TECHNIQUE: [name] was brought to the operating room with Dr. Retta Diones. He was placed in the dorsolithotomy position. He was catheterized and a rectal tube was inserted. The perineum was shaved, prepped and draped. The ultrasound probe was then introduced into the rectum to see the prostate gland.  TREATMENT DEVICE: A needle grid was attached to the ultrasound probe stand and anchor needles were placed.  COMPLEX ISODOSE CALCULATION: The prostate was imaged in 3D using a sagittal sweep of the prostate probe. These images were transferred to the planning computer. There, the prostate, urethra and rectum were defined on each axial reconstructed image. Then, the software created an optimized plan and a few seed positions were adjusted. Then the accepted plan was uploaded to the seed Selectron afterloading unit.  SPECIAL TREATMENT PROCEDURE/SUPERVISION AND HANDLING: The Nucletron FIRST system was used to place the needles under sagittal guidance. A total of 24 needles were used to deposit 54 seeds in the prostate gland. The individual seed activity was 0.34 mCi for a total implant activity of 18.3 mCi.  COMPLEX SIMULATION: At the end of the procedure, an anterior radiograph of the pelvis was obtained to document seed positioning and count. Cystoscopy was performed to check the urethra and bladder.  MICRODOSIMETRY: At the end of the procedure, the patient was emitting 0.3 mrem/hr at 1 meter. Accordingly, he was considered safe for hospital  discharge.  PLAN: The patient will return to the radiation oncology clinic for post implant CT dosimetry in three weeks.

## 2012-02-09 NOTE — Anesthesia Preprocedure Evaluation (Signed)
Anesthesia Evaluation  Patient identified by MRN, date of birth, ID band Patient awake    Reviewed: Allergy & Precautions, H&P , NPO status , Patient's Chart, lab work & pertinent test results  Airway Mallampati: II TM Distance: >3 FB Neck ROM: Full    Dental No notable dental hx.    Pulmonary neg pulmonary ROS, former smoker breath sounds clear to auscultation  Pulmonary exam normal       Cardiovascular negative cardio ROS  Rhythm:Regular Rate:Normal     Neuro/Psych negative neurological ROS  negative psych ROS   GI/Hepatic negative GI ROS, Neg liver ROS,   Endo/Other  negative endocrine ROS  Renal/GU negative Renal ROS  negative genitourinary   Musculoskeletal negative musculoskeletal ROS (+)   Abdominal   Peds negative pediatric ROS (+)  Hematology negative hematology ROS (+)   Anesthesia Other Findings   Reproductive/Obstetrics negative OB ROS                           Anesthesia Physical Anesthesia Plan  ASA: II  Anesthesia Plan: General   Post-op Pain Management:    Induction: Intravenous  Airway Management Planned: LMA  Additional Equipment:   Intra-op Plan:   Post-operative Plan: Extubation in OR  Informed Consent: I have reviewed the patients History and Physical, chart, labs and discussed the procedure including the risks, benefits and alternatives for the proposed anesthesia with the patient or authorized representative who has indicated his/her understanding and acceptance.   Dental advisory given  Plan Discussed with: CRNA  Anesthesia Plan Comments:         Anesthesia Quick Evaluation

## 2012-02-09 NOTE — Anesthesia Procedure Notes (Signed)
Procedure Name: LMA Insertion Date/Time: 02/09/2012 10:35 AM Performed by: Maris Berger T Pre-anesthesia Checklist: Patient identified, Emergency Drugs available, Suction available and Patient being monitored Patient Re-evaluated:Patient Re-evaluated prior to inductionOxygen Delivery Method: Circle System Utilized Preoxygenation: Pre-oxygenation with 100% oxygen Intubation Type: IV induction Ventilation: Mask ventilation without difficulty LMA: LMA inserted LMA Size: 5.0 Number of attempts: 1 Airway Equipment and Method: bite block Placement Confirmation: positive ETCO2 Dental Injury: Teeth and Oropharynx as per pre-operative assessment

## 2012-02-09 NOTE — Discharge Instructions (Addendum)
  Post Anesthesia Home Care Instructions  Activity: Get plenty of rest for the remainder of the day. A responsible adult should stay with you for 24 hours following the procedure.  For the next 24 hours, DO NOT: -Drive a car -Operate machinery -Drink alcoholic beverages -Take any medication unless instructed by your physician -Make any legal decisions or sign important papers.  Meals: Start with liquid foods such as gelatin or soup. Progress to regular foods as tolerated. Avoid greasy, spicy, heavy foods. If nausea and/or vomiting occur, drink only clear liquids until the nausea and/or vomiting subsides. Call your physician if vomiting continues.  Special Instructions/Symptoms: Your throat may feel dry or sore from the anesthesia or the breathing tube placed in your throat during surgery. If this causes discomfort, gargle with warm salt water. The discomfort should disappear within 24 hours.  Brachytherapy for Prostate Cancer Care After  Refer to this sheet in the next few weeks. These instructions provide you with information on caring for yourself after your procedure. Your caregiver may also give you more specific instructions. Your treatment has been planned according to current medical practices, but problems sometimes occur. Call your caregiver if you have any problems or questions after your procedure. HOME CARE INSTRUCTIONS   Only take over-the-counter or prescription medicines for pain, discomfort, or fever as directed by your caregiver.   Keep all follow-up appointments as directed by your caregiver.  SEEK MEDICAL CARE IF:   You have persistent or increasing nausea.   You have persistent blood in your stools.   You have abdominal swelling (distention) or tenderness.  SEEK IMMEDIATE MEDICAL CARE IF:   You have a fever or persistent symptoms for more than 72 hours.   You have a fever and your symptoms suddenly get worse.   You have trouble urinating or are unable to  urinate.   You have burning with urination or develop cloudy urine.   You develop any problems that seem to be getting worse.  MAKE SURE YOU:  Understand these instructions.   Will watch your condition.   Will get help right away if you are not doing well or get worse.  Document Released: 11/06/2010 Document Revised: 09/23/2011 Document Reviewed: 11/06/2010 [employer] Patient Information 2012 [employer]. 

## 2012-02-10 ENCOUNTER — Encounter (HOSPITAL_BASED_OUTPATIENT_CLINIC_OR_DEPARTMENT_OTHER): Payer: Self-pay | Admitting: Urology

## 2012-02-23 ENCOUNTER — Ambulatory Visit: Payer: Medicare Other | Admitting: Radiation Oncology

## 2012-02-25 ENCOUNTER — Encounter: Payer: Self-pay | Admitting: Radiation Oncology

## 2012-02-29 ENCOUNTER — Telehealth: Payer: Self-pay | Admitting: *Deleted

## 2012-02-29 NOTE — Telephone Encounter (Signed)
Called patient to remind of appts. For 03-01-12, lvm for a return call

## 2012-03-01 ENCOUNTER — Ambulatory Visit
Admission: RE | Admit: 2012-03-01 | Discharge: 2012-03-01 | Disposition: A | Discharge status: Home / Self Care | Payer: Medicare Other | Source: Ambulatory Visit | Attending: Radiation Oncology | Admitting: Radiation Oncology

## 2012-03-01 ENCOUNTER — Ambulatory Visit
Admit: 2012-03-01 | Discharge: 2012-03-01 | Disposition: A | Discharge status: Home / Self Care | Payer: Medicare Other | Attending: Radiation Oncology | Admitting: Radiation Oncology

## 2012-03-01 ENCOUNTER — Encounter: Payer: Self-pay | Admitting: Radiation Oncology

## 2012-03-01 VITALS — BP 145/87 | HR 66 | Temp 97.1°F | Resp 20 | Wt 198.7 lb

## 2012-03-01 DIAGNOSIS — C61 Malignant neoplasm of prostate: Secondary | ICD-10-CM | POA: Insufficient documentation

## 2012-03-01 DIAGNOSIS — Z79899 Other long term (current) drug therapy: Secondary | ICD-10-CM | POA: Insufficient documentation

## 2012-03-01 DIAGNOSIS — Z803 Family history of malignant neoplasm of breast: Secondary | ICD-10-CM | POA: Insufficient documentation

## 2012-03-01 DIAGNOSIS — G4730 Sleep apnea, unspecified: Secondary | ICD-10-CM | POA: Insufficient documentation

## 2012-03-01 DIAGNOSIS — Z510 Encounter for antineoplastic radiation therapy: Secondary | ICD-10-CM | POA: Insufficient documentation

## 2012-03-01 DIAGNOSIS — Z801 Family history of malignant neoplasm of trachea, bronchus and lung: Secondary | ICD-10-CM | POA: Insufficient documentation

## 2012-03-01 NOTE — Progress Notes (Signed)
Followup note:  Jared Mullen returns today approximately 3 weeks following his prostate seed implant boost with Dr. Retta Diones in the management of his Stage T2 A. intermediate risk adenocarcinoma prostate. He is generally doing well from a GU and GI standpoint although he does have some slowing of his urinary stream at night along with mild dysuria. No GI difficulties. He sees Dr. Retta Diones this afternoon.  His CT scan today shows what appears to be a favorable seed distribution.  Physical examination: Not performed today.  Impression: Satisfactory progress.  Plan: We'll move ahead with his post implant dosimetry and for the results of Dr. Retta Diones with the next few weeks. I have not scheduled the patient for a formal followup visit and I ask that Dr. Retta Diones keep me posted on his progress.

## 2012-03-01 NOTE — Progress Notes (Signed)
Nocturia x 1, daytime freq diminishing, slight dysuria, no urgency.  Soft bms x 2 /day. Appetite good, some afternoon fatigue.

## 2012-03-01 NOTE — Progress Notes (Signed)
Complex simulation note:  The patient was taken to the CT simulator. His pelvis was scanned. The CT data set was sent to the [facility] planning system for contouring of his prostate and rectum. We will now move ahead with his post implant dosimetry/3-D simulation.

## 2012-03-23 ENCOUNTER — Encounter: Payer: Self-pay | Admitting: Radiation Oncology

## 2012-03-23 NOTE — Progress Notes (Signed)
Post implant CT dosimetry/3-D simulation note:  Jared Mullen underwent post-implant CT dosimetry/3-D simulation on 03/22/2012. His intraoperative prostate volume by ultrasound was 27.7 cc while his post implant prostate via by CT was 25.1 cc, a very good correlation.. Dose volume histograms were obtained for the prostate and rectum. His prostate D 90 is 115.7% and V1 197%, both excellent.  0.16 cc of rectum received the prescribed boost dose of 11,000 cGy predicting a low risk for late rectal toxicity. In summary, the patient has excellent post implant dosimetry with a low-risk for late rectal toxicity.

## 2014-02-27 LAB — BASIC METABOLIC PANEL: GLUCOSE: 97 mg/dL

## 2014-02-27 LAB — PSA: PSA: 0.2

## 2014-02-27 LAB — LIPID PANEL
Cholesterol: 176 mg/dL (ref 0–200)
HDL: 54 mg/dL (ref 35–70)
LDL Cholesterol: 108 mg/dL
TRIGLYCERIDES: 68 mg/dL (ref 40–160)

## 2015-04-24 DIAGNOSIS — Z131 Encounter for screening for diabetes mellitus: Secondary | ICD-10-CM | POA: Insufficient documentation

## 2015-04-24 DIAGNOSIS — L210 Seborrhea capitis: Secondary | ICD-10-CM | POA: Insufficient documentation

## 2015-04-24 DIAGNOSIS — Z1211 Encounter for screening for malignant neoplasm of colon: Secondary | ICD-10-CM | POA: Insufficient documentation

## 2015-04-28 ENCOUNTER — Encounter: Payer: Self-pay | Admitting: Family Medicine

## 2015-04-28 ENCOUNTER — Ambulatory Visit (INDEPENDENT_AMBULATORY_CARE_PROVIDER_SITE_OTHER): Payer: Medicare Other | Admitting: Family Medicine

## 2015-04-28 VITALS — BP 124/72 | HR 67 | Temp 97.6°F | Resp 16 | Ht 70.0 in | Wt 204.0 lb

## 2015-04-28 DIAGNOSIS — Z86010 Personal history of colonic polyps: Secondary | ICD-10-CM

## 2015-04-28 DIAGNOSIS — Z0000 Encounter for general adult medical examination without abnormal findings: Secondary | ICD-10-CM

## 2015-04-28 DIAGNOSIS — L409 Psoriasis, unspecified: Secondary | ICD-10-CM

## 2015-04-28 DIAGNOSIS — Z860101 Personal history of adenomatous and serrated colon polyps: Secondary | ICD-10-CM

## 2015-04-28 DIAGNOSIS — C61 Malignant neoplasm of prostate: Secondary | ICD-10-CM

## 2015-04-28 NOTE — Progress Notes (Signed)
Patient: Jared Mullen, Male    DOB: 1941-09-19, 74 y.o.   MRN: [id number] Visit Date: 04/28/2015  Today's Provider: Lelon Huh, MD   Chief Complaint  Patient presents with  . Medicare Wellness  . Annual Exam    Physical    Subjective:    Annual wellness visit Jared Mullen is a 74 y.o. male who presents today for his Subsequent Annual Wellness Visit. He feels well. He reports exercising yes (goes to gym every day). He reports he is sleeping poorly.  -----------------------------------------------------------      Psoriasis He states that psoriasis continues to given him a lot of trouble. Has not seen Nehemiah Massed since December.                                            Review of Systems  Constitutional: Negative for fever, chills, appetite change and fatigue.  HENT: Negative for congestion, ear pain, hearing loss, nosebleeds and trouble swallowing.   Eyes: Negative for pain and visual disturbance.  Respiratory: Negative for cough, chest tightness and shortness of breath.   Cardiovascular: Negative for chest pain, palpitations and leg swelling.  Gastrointestinal: Negative for nausea, vomiting, abdominal pain, diarrhea, constipation and blood in stool.  Endocrine: Negative for polydipsia, polyphagia and polyuria.  Genitourinary: Negative.  Negative for dysuria and flank pain.  Musculoskeletal: Negative for myalgias, back pain, joint swelling, arthralgias and neck stiffness.  Neurological: Negative for dizziness, tremors, seizures, speech difficulty, weakness, light-headedness and headaches.  Psychiatric/Behavioral: Negative for behavioral problems, confusion, sleep disturbance, dysphoric mood and decreased concentration. The patient is not nervous/anxious.   All other systems reviewed and are negative.   History   Social History  . Marital Status: Married    Spouse Name: N/A  . Number of Children: N/A  . Years of Education: N/A   Occupational History  . Not on file.    Social History Main Topics  . Smoking status: Former Smoker -- 2.00 packs/day for 28 years    Types: Cigarettes    Quit date: 10/18/1980  . Smokeless tobacco: Never Used  . Alcohol Use: No  . Drug Use: No  . Sexual Activity: Not on file     Comment: Low libido   Other Topics Concern  . Not on file   Social History Narrative    Patient Active Problem List   Diagnosis Date Noted  . 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  . 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 H/O adenomatous polyp of colon 11/18/2011  . Prostate cancer 11/04/2011    Past Surgical History  Procedure Laterality Date  . Biospy  09/30/2011-    TRUS/Bx Prostate - 5/12/biopsies positive for cancer, glandular size 32.5 cc  . Cholecystectomy  1992  . Radioactive seed implant  02/09/2012    Procedure: RADIOACTIVE SEED IMPLANT;  Surgeon: Franchot Gallo, MD;  Location: [facility];  Service: Urology;  Laterality: N/A;  RAD TECH OK PER ANNE AT MAIN OR   . Uvulopalatopharyngoplasty      His family history includes Breast cancer in his sister; Cancer in his brother, sister, and sister; Heart disease in his father; Kidney disease in his brother; Lung cancer in his brother and sister.    Previous Medications   No medications on file    Patient Care Team: Birdie Sons, MD as PCP - General (Family Medicine) Franchot Gallo,  MD as Consulting Physician (Urology) Evaristo Bury, MD (Obstetrics and Gynecology) Ralene Bathe, MD as Consulting Physician (Dermatology)     Objective:   Vitals: BP 124/72 mmHg  Pulse 67  Temp(Src) 97.6 F (36.4 C) (Oral)  Resp 16  Ht '5\' 10"'$  (1.778 m)  Wt 204 lb (92.534 kg)  BMI 29.27 kg/m2  SpO2 95%  Physical Exam   General Appearance:    Alert, cooperative, no distress, appears stated age  Head:    Normocephalic, without obvious abnormality, atraumatic  Eyes:    PERRL, conjunctiva/corneas clear, EOM's intact, fundi    benign, both eyes       Ears:    Normal TM's and  external ear canals, both ears  Nose:   Nares normal, septum midline, mucosa normal, no drainage   or sinus tenderness  Throat:   Lips, mucosa, and tongue normal; teeth and gums normal  Neck:   Supple, symmetrical, trachea midline, no adenopathy;       thyroid:  No enlargement/tenderness/nodules; no carotid   bruit or JVD  Back:     Symmetric, no curvature, ROM normal, no CVA tenderness  Lungs:     Clear to auscultation bilaterally, respirations unlabored  Chest wall:    No tenderness or deformity  Heart:    Regular rate and rhythm, S1 and S2 normal, no murmur, rub   or gallop  Abdomen:     Soft, non-tender, bowel sounds active all four quadrants,    no masses, no organomegaly  Genitalia:    deferred  Rectal:    deferred  Extremities:   Extremities normal, atraumatic, no cyanosis or edema  Pulses:   2+ and symmetric all extremities  Skin:   Scattered SK on face and back. Psoriatic changes across scalp and extensor surfaces.   Lymph nodes:   Cervical, supraclavicular, and axillary nodes normal  Neurologic:   CNII-XII intact. Normal strength, sensation and reflexes      throughout    Activities of Daily Living In your present state of health, do you have any difficulty performing the following activities: 04/28/2015  Hearing? N  Vision? N  Difficulty concentrating or making decisions? N  Walking or climbing stairs? N  Dressing or bathing? N  Doing errands, shopping? N    Fall Risk Assessment Fall Risk  04/28/2015  Falls in the past year? No     Depression Screen PHQ 2/9 Scores 04/28/2015  PHQ - 2 Score 0    Cognitive Testing - 6-CIT  Correct? Score   What year is it? yes 0 0 or 4  What month is it? yes 0 0 or 3  Memorize:    Pia Mau,  40,  [place],      What time is it? (within 1 hour) yes 0 0 or 3  Count backwards from 20 yes 0 0, 2, or 4  Name the months of the year yes 0 0, 2, or 4  Repeat name & address above yes 3 0, 2, 4, 6, 8, or 10       TOTAL  SCORE  3/28   Interpretation:  normal  Normal (0-7) Abnormal (8-28)   Audit-C Alcohol Use Screening  Question Answer Points  How often do you have alcoholic drink? never 0  How many drinks do you typically consume in a day? never 0  How oftey will you drink 6 or more in a total? never 0  Total Score:  0   A  score of 3 or more in women, and 4 or more in men indicates increased risk for alcohol abuse, EXCEPT if all of the points are from question 1.      Assessment & Plan:     Annual Wellness Visit  Reviewed patient's Family Medical History Reviewed and updated list of patient's medical providers Assessment of cognitive impairment was done Assessed patient's functional ability Established a written schedule for health screening [place] Completed and Reviewed  Exercise Activities and Dietary recommendations Goals    None      Immunization History  Administered Date(s) Administered  . Pneumococcal Conjugate-13 02/27/2014  . Pneumococcal Polysaccharide-23 08/23/2011    Health Maintenance  Topic Date Due  . TETANUS/TDAP  Needs to check insurance  . ZOSTAVAX  Declined. Has had shingles  . INFLUENZA VACCINE  05/19/2015  . COLONOSCOPY  11/10/2016  . PNA vac Low Risk Adult  Completed      Discussed health benefits of physical activity, and encouraged him to engage in regular exercise appropriate for his age and condition.    ------------------------------------------------------------------------------------------------------------ 1. Annual physical exam Exam remarkable only for psoriatic changes as below.   2. H/O adenomatous polyp of colon Colonoscopy in 2018   3. Psoriasis He is to get back in with dermatology. He is considering seeing a different dermatologist. Considering Dr. Evorn Gong or Dr. Kellie Moor.   4. Medicare annual wellness visit, subsequent As above.   5. Prostate Cancer.  Continue routine follow up with urology.

## 2015-04-28 NOTE — Patient Instructions (Signed)
Please schedule a routine eye exam in the near future.

## 2015-05-30 ENCOUNTER — Encounter: Payer: Self-pay | Admitting: Family Medicine

## 2016-04-28 ENCOUNTER — Encounter: Payer: Self-pay | Admitting: Family Medicine

## 2016-04-28 ENCOUNTER — Ambulatory Visit (INDEPENDENT_AMBULATORY_CARE_PROVIDER_SITE_OTHER): Payer: Medicare Other | Admitting: Family Medicine

## 2016-04-28 VITALS — BP 124/80 | HR 60 | Temp 97.5°F | Resp 16 | Ht 70.0 in | Wt 196.0 lb

## 2016-04-28 DIAGNOSIS — L299 Pruritus, unspecified: Secondary | ICD-10-CM

## 2016-04-28 DIAGNOSIS — Z0000 Encounter for general adult medical examination without abnormal findings: Secondary | ICD-10-CM

## 2016-04-28 DIAGNOSIS — Z0001 Encounter for general adult medical examination with abnormal findings: Secondary | ICD-10-CM

## 2016-04-28 DIAGNOSIS — C61 Malignant neoplasm of prostate: Secondary | ICD-10-CM

## 2016-04-28 NOTE — Progress Notes (Signed)
Patient: Jared Mullen, Male    DOB: [birth year], 75 y.o.   MRN: [id number] Visit Date: 04/28/2016  Today's Provider: Lelon Huh, MD   Chief Complaint  Patient presents with  . Annual Exam   Subjective:    Annual physical  Jared FISSEL is a 75 y.o. male. He feels well. He reports exercising 3 days weeks. He reports he is sleeping poorly due to constant itching, but no rash. Was prescribed doxepin by his dermatologist which didn't help. Otherwise feels well.   -----------------------------------------------------------   Review of Systems  Constitutional: Negative.   HENT: Negative.   Eyes: Negative.   Respiratory: Negative.   Cardiovascular: Negative.   Gastrointestinal: Negative.   Endocrine: Negative.   Genitourinary: Negative.   Musculoskeletal: Negative.   Skin:       Itching   Allergic/Immunologic: Negative.   Neurological: Negative.   Hematological: Negative.   Psychiatric/Behavioral: Negative.     Social History   Social History  . Marital Status: Married    Spouse Name: N/A  . Number of Children: N/A  . Years of Education: N/A   Occupational History  . Not on file.   Social History Main Topics  . Smoking status: Former Smoker -- 2.00 packs/day for 28 years    Types: Cigarettes    Quit date: 10/18/1980  . Smokeless tobacco: Never Used  . Alcohol Use: No  . Drug Use: No  . Sexual Activity: Not on file     Comment: Low libido   Other Topics Concern  . Not on file   Social History Narrative    Past Medical History  Diagnosis Date  . Urinary frequency   . Peyronie's disease   . 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 Nocturia   . Weak urinary stream   . Cataract immature right eye   . History of radiation therapy 12/21/11 to 01/24/12    prostate  . Sleep apnea      Patient Active Problem List   Diagnosis Date Noted  . 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  . 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 H/O adenomatous polyp of colon 11/18/2011  .  Prostate cancer ([place]) 11/04/2011    Past Surgical History  Procedure Laterality Date  . Biospy  09/30/2011-    TRUS/Bx Prostate - 5/12/biopsies positive for cancer, glandular size 32.5 cc  . Cholecystectomy  1992  . Radioactive seed implant  02/09/2012    Procedure: RADIOACTIVE SEED IMPLANT;  Surgeon: Franchot Gallo, MD;  Location: [facility];  Service: Urology;  Laterality: N/A;  RAD TECH OK PER ANNE AT MAIN OR   . Uvulopalatopharyngoplasty      His family history includes Breast cancer in his sister; Cancer in his brother, sister, and sister; Heart disease in his father; Kidney disease in his brother; Lung cancer in his brother and sister.    No outpatient prescriptions have been marked as taking for the 04/28/16 encounter (Office Visit) with Birdie Sons, MD.    Patient Care Team: Birdie Sons, MD as PCP - General (Family Medicine) Franchot Gallo, MD as Consulting Physician (Urology) Ralene Bathe, MD as Consulting Physician (Dermatology)    Objective:   Vitals: BP 124/80 mmHg  Pulse 60  Temp(Src) 97.5 F (36.4 C) (Oral)  Resp 16  Ht '5\' 10"'$  (1.778 m)  Wt 196 lb (88.905 kg)  BMI 28.12 kg/m2  Physical Exam   General Appearance:    Alert,  cooperative, no distress, appears stated age  Head:    Normocephalic, without obvious abnormality, atraumatic  Eyes:    PERRL, conjunctiva/corneas clear, EOM's intact, fundi    benign, both eyes       Ears:    Normal TM's and external ear canals, both ears  Nose:   Nares normal, septum midline, mucosa normal, no drainage   or sinus tenderness  Throat:   Lips, mucosa, and tongue normal; teeth and gums normal  Neck:   Supple, symmetrical, trachea midline, no adenopathy;       thyroid:  No enlargement/tenderness/nodules; no carotid   bruit or JVD  Back:     Symmetric, no curvature, ROM normal, no CVA tenderness  Lungs:     Clear to auscultation bilaterally, respirations unlabored  Chest wall:    No  tenderness or deformity  Heart:    Regular rate and rhythm, S1 and S2 normal, no murmur, rub   or gallop  Abdomen:     Soft, non-tender, bowel sounds active all four quadrants,    no masses, no organomegaly  Genitalia:    deferred  Rectal:    deferred  Extremities:   Extremities normal, atraumatic, no cyanosis or edema  Pulses:   2+ and symmetric all extremities  Skin:   Skin color, texture, turgor normal, no rashes or lesions  Lymph nodes:   Cervical, supraclavicular, and axillary nodes normal  Neurologic:   CNII-XII intact. Normal strength, sensation and reflexes      throughout    Activities of Daily Living In your present state of health, do you have any difficulty performing the following activities: 04/28/2016  Hearing? N  Vision? N  Difficulty concentrating or making decisions? N  Walking or climbing stairs? N  Dressing or bathing? N  Doing errands, shopping? N    Fall Risk Assessment Fall Risk  04/28/2016 04/28/2015 04/28/2015  Falls in the past year? No No No     Depression Screen PHQ 2/9 Scores 04/28/2016 04/28/2015  PHQ - 2 Score 0 0    Cognitive Testing - 6-CIT  Correct? Score   What year is it? yes 0 0 or 4  What month is it? yes 0 0 or 3  Memorize:    Pia Mau,  40,  [address]  [place],      What time is it? (within 1 hour) yes 0 0 or 3  Count backwards from 20 yes 0 0, 2, or 4  Name the months of the year yes 0 0, 2, or 4  Repeat name & address above no 1 0, 2, 4, 6, 8, or 10       TOTAL SCORE  2/28   Interpretation:  Normal  Normal (0-7) Abnormal (8-28)       Assessment & Plan:     Annual physical  Reviewed patient's Family Medical History Reviewed and updated list of patient's medical providers Assessment of cognitive impairment was done Assessed patient's functional ability Established a written schedule for health screening [place] Completed and Reviewed  Exercise Activities and Dietary recommendations Goals     None      Immunization History  Administered Date(s) Administered  . Pneumococcal Conjugate-13 02/27/2014  . Pneumococcal Polysaccharide-23 08/23/2011    Health Maintenance  Topic Date Due  . TETANUS/TDAP  12/17/1957  . ZOSTAVAX  12/18/1998  . INFLUENZA VACCINE  05/18/2016  . COLONOSCOPY  11/10/2016  . PNA vac Low Risk Adult  Completed  Discussed health benefits of physical activity, and encouraged him to engage in regular exercise appropriate for his age and condition.    ------------------------------------------------------------------------------------------------------------ 1. Annual physical exam Generally doing well. Some stress and anxiety caring for his wife, may be contributing to pruritis below  2. Pruritic condition Patient Instructions  Try OTC Zyrtec (cetirizine) once a day for itching.    - CBC - Comprehensive metabolic panel  3. Prostate cancer [facility]) Continue annual follow up with urology.  - PSA    Lelon Huh, MD  [facility]

## 2016-04-28 NOTE — Patient Instructions (Signed)
Try OTC Zyrtec (cetirizine) once a day for itching.

## 2016-04-29 ENCOUNTER — Telehealth: Payer: Self-pay | Admitting: Emergency Medicine

## 2016-04-29 LAB — COMPREHENSIVE METABOLIC PANEL
A/G RATIO: 1.4 (ref 1.2–2.2)
ALBUMIN: 4.1 g/dL (ref 3.5–4.8)
ALT: 14 IU/L (ref 0–44)
AST: 16 IU/L (ref 0–40)
Alkaline Phosphatase: 73 IU/L (ref 39–117)
BUN / CREAT RATIO: 14 (ref 10–24)
BUN: 10 mg/dL (ref 8–27)
Bilirubin Total: 1.1 mg/dL (ref 0.0–1.2)
CALCIUM: 9.5 mg/dL (ref 8.6–10.2)
CO2: 26 mmol/L (ref 18–29)
CREATININE: 0.7 mg/dL — AB (ref 0.76–1.27)
Chloride: 101 mmol/L (ref 96–106)
GFR calc Af Amer: 105 mL/min/{1.73_m2} (ref >59)
GFR, EST NON AFRICAN AMERICAN: 91 mL/min/{1.73_m2} (ref >59)
GLOBULIN, TOTAL: 3 g/dL (ref 1.5–4.5)
Glucose: 96 mg/dL (ref 65–99)
POTASSIUM: 4.4 mmol/L (ref 3.5–5.2)
SODIUM: 142 mmol/L (ref 134–144)
Total Protein: 7.1 g/dL (ref 6.0–8.5)

## 2016-04-29 LAB — CBC
Hematocrit: 45.3 % (ref 37.5–51.0)
Hemoglobin: 14.7 g/dL (ref 12.6–17.7)
MCH: 29.9 pg (ref 26.6–33.0)
MCHC: 32.5 g/dL (ref 31.5–35.7)
MCV: 92 fL (ref 79–97)
Platelets: 221 10*3/uL (ref 150–379)
RBC: 4.92 x10E6/uL (ref 4.14–5.80)
RDW: 14.5 % (ref 12.3–15.4)
WBC: 6 10*3/uL (ref 3.4–10.8)

## 2016-04-29 LAB — PSA: Prostate Specific Ag, Serum: <0.1 ng/mL (ref 0.0–4.0)

## 2016-04-29 NOTE — Telephone Encounter (Signed)
LMTCB

## 2016-04-29 NOTE — Telephone Encounter (Signed)
-----   Message from Birdie Sons, MD sent at 04/29/2016  7:57 AM EDT ----- Labs are all normal. PSA is < 0.1

## 2016-04-30 NOTE — Telephone Encounter (Signed)
Patient advised.

## 2016-09-29 ENCOUNTER — Ambulatory Visit (INDEPENDENT_AMBULATORY_CARE_PROVIDER_SITE_OTHER): Payer: Medicare Other | Admitting: Physician Assistant

## 2016-09-29 DIAGNOSIS — Z23 Encounter for immunization: Secondary | ICD-10-CM

## 2016-11-16 ENCOUNTER — Encounter: Payer: Self-pay | Admitting: Emergency Medicine

## 2016-11-16 ENCOUNTER — Emergency Department: Payer: Medicare Other

## 2016-11-16 ENCOUNTER — Emergency Department
Admission: EM | Admit: 2016-11-16 | Discharge: 2016-11-16 | Disposition: A | Discharge status: Home / Self Care | Payer: Medicare Other | Attending: Emergency Medicine | Admitting: Emergency Medicine

## 2016-11-16 DIAGNOSIS — J029 Acute pharyngitis, unspecified: Secondary | ICD-10-CM | POA: Diagnosis present

## 2016-11-16 DIAGNOSIS — Z87891 Personal history of nicotine dependence: Secondary | ICD-10-CM | POA: Insufficient documentation

## 2016-11-16 DIAGNOSIS — Z8546 Personal history of malignant neoplasm of prostate: Secondary | ICD-10-CM | POA: Insufficient documentation

## 2016-11-16 DIAGNOSIS — J069 Acute upper respiratory infection, unspecified: Secondary | ICD-10-CM | POA: Diagnosis not present

## 2016-11-16 MED ORDER — FLUTICASONE PROPIONATE 50 MCG/ACT NA SUSP: 1.0000 | Freq: Two times a day (BID) | NASAL | 0 refills | Status: DC

## 2016-11-16 MED ORDER — PSEUDOEPH-BROMPHEN-DM 30-2-10 MG/5ML PO SYRP
10.0000 mL | ORAL_SOLUTION | Freq: Four times a day (QID) | ORAL | 0 refills | Status: DC | PRN
Start: 2016-11-16 — End: 2016-12-06

## 2016-11-16 MED ORDER — BENZONATATE 200 MG PO CAPS: 200.0000 mg | ORAL_CAPSULE | Freq: Three times a day (TID) | ORAL | 0 refills | Status: DC | PRN

## 2016-11-16 NOTE — ED Provider Notes (Signed)
[facility] Emergency Department Provider Note  ____________________________________________  Time seen: Approximately 9:20 PM  I have reviewed the triage vital signs and the nursing notes.   HISTORY  Chief Complaint Nasal Congestion; Generalized Body Aches; and Weakness    HPI Jared Mullen is a 76 y.o. male 5 day history of gradual onset of nasal congestion, sore throat, coughing, body aches, chills. Patient states that symptoms began with nasal congestion and slight sore throat. They have increased to include increasing sore throat, no cough, chills last night. Patient reports some generalized body aches but states that these are intermittent in nature. Patient did have a flu shot this year. He denies feeling hot or any fevers at home. Patient has not been taking any medication for this complaint prior to arrival.   Past Medical History:  Diagnosis Date  . Cataract immature right eye   . History of radiation therapy 12/21/11 to 01/24/12   prostate  . Nocturia   . Peyronie's disease   . 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 Sleep apnea   . Urinary frequency   . Weak urinary stream     Patient Active Problem List   Diagnosis Date Noted  . 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  . 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 H/O adenomatous polyp of colon 11/18/2011  . Prostate cancer ([place]) 11/04/2011    Past Surgical History:  Procedure Laterality Date  . biospy  09/30/2011-   TRUS/Bx Prostate - 5/12/biopsies positive for cancer, glandular size 32.5 cc  . CHOLECYSTECTOMY  1992  . RADIOACTIVE SEED IMPLANT  02/09/2012   Procedure: RADIOACTIVE SEED IMPLANT;  Surgeon: Franchot Gallo, MD;  Location: [facility];  Service: Urology;  Laterality: N/A;  RAD TECH OK PER ANNE AT MAIN OR   . UVULOPALATOPHARYNGOPLASTY      Prior to Admission medications   Medication Sig Start Date End Date Taking? Authorizing Provider  benzonatate (TESSALON) 200 MG capsule  Take 1 capsule (200 mg total) by mouth 3 (three) times daily as needed for cough. 11/16/16   Charline Bills Brandell Maready, PA-C  brompheniramine-pseudoephedrine-DM 30-2-10 MG/5ML syrup Take 10 mLs by mouth 4 (four) times daily as needed. 11/16/16   Charline Bills Precious Segall, PA-C  fluticasone (FLONASE) 50 MCG/ACT nasal spray Place 1 spray into both nostrils 2 (two) times daily. 11/16/16   Charline Bills Jazlynne Milliner, PA-C    Allergies Patient has no known allergies.  Family History  Problem Relation Age of Onset  . Heart disease Father   . Breast cancer Sister   . Cancer Sister     lung  . Lung cancer Brother   . Cancer Brother     bladder  . Lung cancer Sister   . Cancer Sister     breast  . Kidney disease Brother     Social History Social History  Substance Use Topics  . Smoking status: Former Smoker    Packs/day: 2.00    Years: 28.00    Types: Cigarettes    Quit date: 10/18/1980  . Smokeless tobacco: Never Used  . Alcohol use No     Review of Systems  Constitutional: No fever But positive for chills Eyes: No visual changes. No discharge ENT: Positive for nasal congestion. Positive for sore throat. Cardiovascular: no chest pain. Respiratory: Positive cough. No SOB. Gastrointestinal: No abdominal pain.  No nausea, no vomiting.  No diarrhea.  No constipation. Musculoskeletal: Negative for musculoskeletal pain. Skin: Negative for rash, abrasions, lacerations, ecchymosis. Neurological: Negative  for headaches, focal weakness or numbness. 10-point ROS otherwise negative.  ____________________________________________   PHYSICAL EXAM:  VITAL SIGNS: ED Triage Vitals  Enc Vitals Group     BP 11/16/16 1943 (!) 148/90     Pulse Rate 11/16/16 1943 61     Resp 11/16/16 1943 16     Temp 11/16/16 1943 98.2 F (36.8 C)     Temp Source 11/16/16 1943 Oral     SpO2 11/16/16 1943 99 %     Weight 11/16/16 1945 185 lb (83.9 kg)     Height 11/16/16 1945 '5\' 10"'$  (1.778 m)     Head Circumference --       Peak Flow --      Pain Score --      Pain Loc --      Pain Edu? --      Excl. in [place]? --      Constitutional: Alert and oriented. Well appearing and in no acute distress. Eyes: Conjunctivae are normal. PERRL. EOMI. Head: Atraumatic. ENT:      Ears: EACs and TMs unremarkable bilaterally.      Nose: Mild congestion/rhinnorhea.      Mouth/Throat: Mucous membranes are moist.  Neck: No stridor.   Hematological/Lymphatic/Immunilogical: No cervical lymphadenopathy. Cardiovascular: Normal rate, regular rhythm. Normal S1 and S2.  Good peripheral circulation. Respiratory: Normal respiratory effort without tachypnea or retractions. Lungs CTAB. Good air entry to the bases with no decreased or absent breath sounds. Musculoskeletal: Full range of motion to all extremities. No gross deformities appreciated. Neurologic:  Normal speech and language. No gross focal neurologic deficits are appreciated.  Skin:  Skin is warm, dry and intact. No rash noted. Psychiatric: Mood and affect are normal. Speech and behavior are normal. Patient exhibits appropriate insight and judgement.   ____________________________________________   LABS (all labs ordered are listed, but only abnormal results are displayed)  Labs Reviewed - No data to display ____________________________________________  EKG   ____________________________________________  RADIOLOGY Diamantina [facility] Hattie Aguinaldo, personally viewed and evaluated these images (plain radiographs) as part of my medical decision making, as well as reviewing the written report by the radiologist.  Dg Chest 2 View  Result Date: 11/16/2016 CLINICAL DATA:  Productive cough with nausea for 1 week, history of radiation therapy, prostate cancer with seed implantation, former smoker EXAM: CHEST  2 VIEW COMPARISON:  01/07/2012 FINDINGS: Normal heart size, mediastinal contours, and pulmonary vascularity. Calcified pleural plaque at the RIGHT diaphragm. Additional  calcifications in both lungs could represent granulomata or additional calcified plaques. Question history of asbestos exposure. Underlying emphysematous changes. No acute infiltrate, pleural effusion or pneumothorax. Bones unremarkable. IMPRESSION: Calcified pleural plaque disease RIGHT diaphragm and questionably at both lungs, does patient have a history of asbestos exposure? 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 Electronically Signed   By: Lavonia Dana M.D.   On: 11/16/2016 20:53    ____________________________________________    PROCEDURES  Procedure(s) performed:    Procedures    Medications - No data to display   ____________________________________________   INITIAL IMPRESSION / ASSESSMENT AND PLAN / ED COURSE  Pertinent labs & imaging results that were available during my care of the patient were reviewed by me and considered in my medical decision making (see chart for details).  Review of the [place] CSRS was performed in accordance of the [place] prior to dispensing any controlled drugs.     Patient's diagnosis is consistent with viral upper respiratory illness. Symptoms began insidiously been ongoing for 5 days. Symptoms  are not consistent with influenza. The patient did have considerable dry cough, imaging reveals no acute consolidation consistent with pneumonia. Incidental finding of plaques in lower lung fields were consistent with possible specimens exposure. Upon questioning, patient states that he did have significant asbestos exposure earlier in life.. Patient will be discharged home with prescriptions for symptom control medications. Patient is to follow up with primary care as needed or otherwise directed. Patient is given ED precautions to return to the ED for any worsening or new symptoms.     ____________________________________________  FINAL CLINICAL IMPRESSION(S) / ED DIAGNOSES  Final diagnoses:  Viral upper respiratory tract infection      NEW  MEDICATIONS STARTED DURING THIS VISIT:  Discharge Medication List as of 11/16/2016  9:23 PM    START taking these medications   Details  benzonatate (TESSALON) 200 MG capsule Take 1 capsule (200 mg total) by mouth 3 (three) times daily as needed for cough., Starting Tue 11/16/2016, Print    brompheniramine-pseudoephedrine-DM 30-2-10 MG/5ML syrup Take 10 mLs by mouth 4 (four) times daily as needed., Starting Tue 11/16/2016, Print    fluticasone (FLONASE) 50 MCG/ACT nasal spray Place 1 spray into both nostrils 2 (two) times daily., Starting Tue 11/16/2016, Print            This chart was dictated using voice recognition software/Dragon. Despite best efforts to proofread, errors can occur which can change the meaning. Any change was purely unintentional.    Darletta Moll, PA-C 11/16/16 2141    Rudene Re, MD 11/16/16 2251

## 2016-11-16 NOTE — ED Triage Notes (Addendum)
Pt presents to triage room ambulatory reports feeling weak, with nasal congestion, cough, and headache associated with sever coughing. None at present, generalized body aches denies any fever reports had flu shot about 6 weeks ago, reports feeling nausea denies any vomiting or diarrhea. Pt talks in complete sentences no distress noted.

## 2016-12-06 ENCOUNTER — Encounter: Payer: Self-pay | Admitting: Family Medicine

## 2016-12-06 ENCOUNTER — Ambulatory Visit (INDEPENDENT_AMBULATORY_CARE_PROVIDER_SITE_OTHER): Payer: Medicare Other | Admitting: Family Medicine

## 2016-12-06 VITALS — BP 140/90 | HR 64 | Temp 97.8°F | Resp 16 | Ht 70.0 in | Wt 187.0 lb

## 2016-12-06 DIAGNOSIS — R110 Nausea: Secondary | ICD-10-CM

## 2016-12-06 DIAGNOSIS — R1013 Epigastric pain: Secondary | ICD-10-CM

## 2016-12-06 NOTE — Progress Notes (Signed)
Patient: Jared Mullen Male    DOB: 03/20/1941   76 y.o.   MRN: [id number] Visit Date: 12/06/2016  Today's Provider: Lelon Huh, MD   Chief Complaint  Patient presents with  . Abdominal Pain   Subjective:    Patient states that for the last 2 weeks he has been having pain and nausea when he eats. Patient stated that the only time pain occurs is when he eats solid food. Patient states pain is in mid-abdominal region and radiates up to throat. Patient has lost weight from eating. Patient has tried [employer] with no relief.     Abdominal Pain  This is a new problem. The current episode started 1 to 4 weeks ago (2 weeks ago). The onset quality is gradual. The problem occurs constantly. The pain is located in the epigastric region and generalized abdominal region. The pain is at a severity of 8/10. The pain is severe. The quality of the pain is dull. The abdominal pain radiates to the epigastric region. Associated symptoms include anorexia, belching, headaches, nausea and weight loss. Pertinent negatives include no arthralgias, constipation, diarrhea, dysuria, fever, flatus, frequency, hematochezia, hematuria, melena, myalgias or vomiting. The pain is aggravated by eating. He has tried antacids for the symptoms. The treatment provided no relief.   No difficulty with BM, last was this morning.   Past Surgical History:  Procedure Laterality Date  . biospy  09/30/2011-   TRUS/Bx Prostate - 5/12/biopsies positive for cancer, glandular size 32.5 cc  . CHOLECYSTECTOMY  1992  . RADIOACTIVE SEED IMPLANT  02/09/2012   Procedure: RADIOACTIVE SEED IMPLANT;  Surgeon: Franchot Gallo, MD;  Location: [facility];  Service: Urology;  Laterality: N/A;  RAD TECH OK PER ANNE AT MAIN OR   . UVULOPALATOPHARYNGOPLASTY      No Known Allergies   Current Outpatient Prescriptions:  .  fluticasone (FLONASE) 50 MCG/ACT nasal spray, Place 1 spray into both nostrils 2 (two) times daily.,  Disp: 16 g, Rfl: 0  Review of Systems  Constitutional: Positive for weight loss. Negative for appetite change, chills and fever.  Respiratory: Negative for chest tightness, shortness of breath and wheezing.   Cardiovascular: Negative for chest pain and palpitations.  Gastrointestinal: Positive for abdominal pain, anorexia and nausea. Negative for constipation, diarrhea, flatus, hematochezia, melena and vomiting.  Genitourinary: Negative for dysuria, frequency and hematuria.  Musculoskeletal: Negative for arthralgias and myalgias.  Neurological: Positive for headaches.    Social History  Substance Use Topics  . Smoking status: Former Smoker    Packs/day: 2.00    Years: 28.00    Types: Cigarettes    Quit date: 10/18/1980  . Smokeless tobacco: Never Used  . Alcohol use No   Objective:   BP 140/90 (BP Location: Left Arm, Patient Position: Sitting, Cuff Size: Normal)   Pulse 64   Temp 97.8 F (36.6 C) (Oral)   Resp 16   Ht '5\' 10"'$  (1.778 m)   Wt 187 lb (84.8 kg)   BMI 26.83 kg/m   Physical Exam  General Appearance:    Alert, cooperative, no distress  Eyes:    PERRL, conjunctiva/corneas clear, EOM's intact       Lungs:     Clear to auscultation bilaterally, respirations unlabored  Heart:    Regular rate and rhythm  Abdomen:   bowel sounds present and normal in all 4 quadrants, soft, round, nontender or nondistended. No CVA tenderness  Assessment & Plan:     1. Epigastric pain Is s/p cholecystectomy - Comprehensive metabolic panel - CBC - H Pylori, IGM, IGG, IGA AB - Amylase  2. Nausea  - Comprehensive metabolic panel - CBC - H Pylori, IGM, IGG, IGA AB - Amylase       Lelon Huh, MD  [facility]

## 2016-12-09 ENCOUNTER — Telehealth: Payer: Self-pay

## 2016-12-09 DIAGNOSIS — R1084 Generalized abdominal pain: Secondary | ICD-10-CM

## 2016-12-09 LAB — COMPREHENSIVE METABOLIC PANEL
A/G RATIO: 1.5 (ref 1.2–2.2)
ALBUMIN: 4.2 g/dL (ref 3.5–4.8)
ALT: 15 IU/L (ref 0–44)
AST: 19 IU/L (ref 0–40)
Alkaline Phosphatase: 73 IU/L (ref 39–117)
BUN / CREAT RATIO: 13 (ref 10–24)
BUN: 12 mg/dL (ref 8–27)
Bilirubin Total: 1.5 mg/dL — ABNORMAL HIGH (ref 0.0–1.2)
CALCIUM: 9.8 mg/dL (ref 8.6–10.2)
CO2: 26 mmol/L (ref 18–29)
CREATININE: 0.93 mg/dL (ref 0.76–1.27)
Chloride: 100 mmol/L (ref 96–106)
GFR, EST AFRICAN AMERICAN: 91 (ref >59)
GFR, EST NON AFRICAN AMERICAN: 79 (ref >59)
GLOBULIN, TOTAL: 2.8 (ref 1.5–4.5)
Glucose: 97 mg/dL (ref 65–99)
POTASSIUM: 4.7 mmol/L (ref 3.5–5.2)
SODIUM: 142 mmol/L (ref 134–144)
Total Protein: 7 g/dL (ref 6.0–8.5)

## 2016-12-09 LAB — AMYLASE: Amylase: 45 U/L (ref 31–124)

## 2016-12-09 LAB — H PYLORI, IGM, IGG, IGA AB

## 2016-12-09 LAB — CBC
HEMATOCRIT: 45.8 % (ref 37.5–51.0)
HEMOGLOBIN: 15.2 g/dL (ref 13.0–17.7)
MCH: 30.2 pg (ref 26.6–33.0)
MCHC: 33.2 g/dL (ref 31.5–35.7)
MCV: 91 fL (ref 79–97)
Platelets: 236 10*3/uL (ref 150–379)
RBC: 5.04 x10E6/uL (ref 4.14–5.80)
RDW: 14.8 % (ref 12.3–15.4)
WBC: 6.1 10*3/uL (ref 3.4–10.8)

## 2016-12-09 NOTE — Telephone Encounter (Signed)
-----   Message from Birdie Sons, MD sent at 12/09/2016  7:52 AM EST ----- Labs are all normal. Need abdominal ultrasound ordered for abdominal pain and nausea.

## 2016-12-09 NOTE — Telephone Encounter (Signed)
Advised patient of results. Order has been placed for abdominal US.

## 2016-12-13 ENCOUNTER — Ambulatory Visit
Admission: RE | Admit: 2016-12-13 | Discharge: 2016-12-13 | Disposition: A | Discharge status: Home / Self Care | Payer: Medicare Other | Source: Ambulatory Visit | Attending: Family Medicine | Admitting: Family Medicine

## 2016-12-13 DIAGNOSIS — N281 Cyst of kidney, acquired: Secondary | ICD-10-CM | POA: Diagnosis not present

## 2016-12-13 DIAGNOSIS — Z9049 Acquired absence of other specified parts of digestive tract: Secondary | ICD-10-CM | POA: Insufficient documentation

## 2016-12-13 DIAGNOSIS — R1084 Generalized abdominal pain: Secondary | ICD-10-CM | POA: Insufficient documentation

## 2016-12-13 DIAGNOSIS — K7689 Other specified diseases of liver: Secondary | ICD-10-CM | POA: Insufficient documentation

## 2016-12-13 DIAGNOSIS — I700 Atherosclerosis of aorta: Secondary | ICD-10-CM | POA: Diagnosis not present

## 2016-12-14 ENCOUNTER — Other Ambulatory Visit: Payer: Self-pay | Admitting: Family Medicine

## 2016-12-14 ENCOUNTER — Telehealth: Payer: Self-pay

## 2016-12-14 DIAGNOSIS — K7689 Other specified diseases of liver: Secondary | ICD-10-CM

## 2016-12-14 DIAGNOSIS — R1084 Generalized abdominal pain: Secondary | ICD-10-CM

## 2016-12-14 NOTE — Telephone Encounter (Signed)
Left message to call back  

## 2016-12-14 NOTE — Telephone Encounter (Signed)
-----   Message from Birdie Sons, MD sent at 12/14/2016  7:48 AM EST ----- Ultrasound shows fatty liver and several cystic masses in liver which need further evaluation. Need referral to GI to for further evaluation.

## 2016-12-14 NOTE — Telephone Encounter (Signed)
Advised patient of results. Referral has been processed.

## 2016-12-28 LAB — HM COLONOSCOPY

## 2016-12-29 ENCOUNTER — Other Ambulatory Visit: Payer: Self-pay | Admitting: Gastroenterology

## 2016-12-29 DIAGNOSIS — R932 Abnormal findings on diagnostic imaging of liver and biliary tract: Secondary | ICD-10-CM

## 2017-01-03 ENCOUNTER — Ambulatory Visit
Admission: RE | Admit: 2017-01-03 | Discharge: 2017-01-03 | Disposition: A | Discharge status: Home / Self Care | Payer: Medicare Other | Source: Ambulatory Visit | Attending: Gastroenterology | Admitting: Gastroenterology

## 2017-01-03 DIAGNOSIS — R932 Abnormal findings on diagnostic imaging of liver and biliary tract: Secondary | ICD-10-CM

## 2017-01-03 MED ORDER — GADOXETATE DISODIUM 0.25 MMOL/ML IV SOLN
8.0000 mL | Freq: Once | INTRAVENOUS | Status: AC | PRN
  Administered 2017-01-03: 8 mL via INTRAVENOUS

## 2017-01-05 ENCOUNTER — Encounter: Payer: Self-pay | Admitting: Family Medicine

## 2017-01-07 ENCOUNTER — Encounter: Payer: Self-pay | Admitting: Family Medicine

## 2017-01-10 ENCOUNTER — Encounter: Payer: Self-pay | Admitting: Family Medicine

## 2017-03-07 ENCOUNTER — Ambulatory Visit
Admission: RE | Admit: 2017-03-07 | Discharge: 2017-03-07 | Disposition: A | Discharge status: Home / Self Care | Payer: Medicare Other | Source: Ambulatory Visit | Attending: Family Medicine | Admitting: Family Medicine

## 2017-03-07 ENCOUNTER — Ambulatory Visit (INDEPENDENT_AMBULATORY_CARE_PROVIDER_SITE_OTHER): Payer: Medicare Other | Admitting: Family Medicine

## 2017-03-07 ENCOUNTER — Telehealth: Payer: Self-pay

## 2017-03-07 ENCOUNTER — Encounter: Payer: Self-pay | Admitting: Family Medicine

## 2017-03-07 VITALS — BP 138/70 | HR 64 | Temp 97.9°F | Resp 16 | Wt 191.0 lb

## 2017-03-07 DIAGNOSIS — M79671 Pain in right foot: Secondary | ICD-10-CM

## 2017-03-07 DIAGNOSIS — M722 Plantar fascial fibromatosis: Secondary | ICD-10-CM

## 2017-03-07 DIAGNOSIS — M545 Low back pain: Secondary | ICD-10-CM | POA: Insufficient documentation

## 2017-03-07 DIAGNOSIS — G8929 Other chronic pain: Secondary | ICD-10-CM

## 2017-03-07 LAB — POCT URINALYSIS DIPSTICK
Bilirubin, UA: NEGATIVE
Blood, UA: NEGATIVE
GLUCOSE UA: NEGATIVE
Ketones, UA: NEGATIVE
Leukocytes, UA: NEGATIVE
NITRITE UA: NEGATIVE
PROTEIN UA: NEGATIVE
SPEC GRAV UA: 1.025 (ref 1.010–1.025)
UROBILINOGEN UA: 0.2 U/dL
pH, UA: 6 (ref 5.0–8.0)

## 2017-03-07 NOTE — Progress Notes (Signed)
       Patient: Jared Mullen Male    DOB: March 23, 1941   76 y.o.   MRN: [id number] Visit Date: 03/07/2017  Today's Provider: Lelon Huh, MD   Chief Complaint  Patient presents with  . Back Pain    Left sided started last night.   . Foot Pain   Subjective:    Back Pain  This is a new problem. The current episode started yesterday. The problem occurs constantly. The problem has been rapidly worsening since onset. The pain is present in the lumbar spine (Left sided). The pain does not radiate. The pain is at a severity of 10/10. The pain is severe. The pain is the same all the time. Pertinent negatives include no fever.  States pain started last night after going to bed. Also feels nauseated. Ate biscuits this morning, but had no affect on pain.     Pt also comes in today complaining of right heel pain for about a month.  He reports his heel hurts "all the time."  His only relief is when he goes to bed at night.  Taking no OTC medications.     No Known Allergies   Current Outpatient Prescriptions:  .  fluticasone (FLONASE) 50 MCG/ACT nasal spray, Place 1 spray into both nostrils 2 (two) times daily. (Patient not taking: Reported on 03/07/2017), Disp: 16 g, Rfl: 0  Review of Systems  Constitutional: Positive for fatigue. Negative for activity change, appetite change, chills, diaphoresis, fever and unexpected weight change.  Genitourinary: Negative.   Musculoskeletal: Positive for arthralgias, back pain and gait problem. Negative for joint swelling, myalgias, neck pain and neck stiffness.    Social History  Substance Use Topics  . Smoking status: Former Smoker    Packs/day: 2.00    Years: 28.00    Types: Cigarettes    Quit date: 10/18/1980  . Smokeless tobacco: Never Used  . Alcohol use No   Objective:   BP 138/70 (BP Location: Left Arm, Patient Position: Sitting, Cuff Size: Large)   Pulse 64   Temp 97.9 F (36.6 C) (Oral)   Resp 16   Wt 191 lb (86.6 kg)   BMI 27.41 kg/m       Physical Exam  Moderate tenderness over left posterior ribs. . No spinous tenderness. No gross deformities.  Slight tenderness plantar aspect of right heel. No gross deformities. FROM of foot.   Results for orders placed or performed in visit on 03/07/17  POCT urinalysis dipstick  Result Value Ref Range   Color, UA Yellow    Clarity, UA Clear    Glucose, UA Negative    Bilirubin, UA Negative    Ketones, UA Negative    Spec Grav, UA 1.025 1.010 - 1.025   Blood, UA Negative    pH, UA 6.0 5.0 - 8.0   Protein, UA Negative    Urobilinogen, UA 0.2 0.2 or 1.0 E.U./dL   Nitrite, UA Negative    Leukocytes, UA Negative Negative       Assessment & Plan:     1. Acute left-sided low back pain, with sciatica presence unspecified Rule out fracture.  - POCT urinalysis dipstick - DG Ribs Unilateral Left; Future  2. Heel pain, chronic, right  - DG Os [place] Right; Future       Lelon Huh, MD  [facility]

## 2017-03-07 NOTE — Telephone Encounter (Signed)
-----   Message from Birdie Sons, MD sent at 03/07/2017  4:46 PM EDT ----- Odette Horns of back of ribs is normal. He probable strained a muscle in back and needs to apply heat for about 8 minutes three times a day.  Xray shows signs of plantar fasciitis in heal. Needs referral to podiatry for treatment.

## 2017-03-07 NOTE — Patient Instructions (Signed)
Go to the [facility] on [address] for [employer]

## 2017-03-07 NOTE — Telephone Encounter (Signed)
Pt advised and referral ordered. Renaldo Fiddler, CMA

## 2017-03-08 ENCOUNTER — Other Ambulatory Visit: Payer: Self-pay

## 2017-03-08 ENCOUNTER — Ambulatory Visit
Admission: RE | Admit: 2017-03-08 | Discharge: 2017-03-08 | Disposition: A | Discharge status: Home / Self Care | Payer: Medicare Other | Source: Ambulatory Visit | Attending: Family Medicine | Admitting: Family Medicine

## 2017-03-08 ENCOUNTER — Emergency Department
Admission: EM | Admit: 2017-03-08 | Discharge: 2017-03-08 | Disposition: A | Discharge status: Home / Self Care | Payer: Medicare Other | Source: Home / Self Care

## 2017-03-08 DIAGNOSIS — R195 Other fecal abnormalities: Secondary | ICD-10-CM | POA: Diagnosis not present

## 2017-03-08 DIAGNOSIS — R52 Pain, unspecified: Secondary | ICD-10-CM

## 2017-03-08 DIAGNOSIS — R1084 Generalized abdominal pain: Secondary | ICD-10-CM | POA: Insufficient documentation

## 2017-03-08 DIAGNOSIS — Z87891 Personal history of nicotine dependence: Secondary | ICD-10-CM

## 2017-03-08 DIAGNOSIS — Z5321 Procedure and treatment not carried out due to patient leaving prior to being seen by health care provider: Secondary | ICD-10-CM | POA: Insufficient documentation

## 2017-03-09 ENCOUNTER — Telehealth: Payer: Self-pay

## 2017-03-09 NOTE — Telephone Encounter (Signed)
-----   Message from Birdie Sons, MD sent at 03/08/2017  5:13 PM EDT ----- Jared Mullen shows a lot of stool in abdomen, otherwise normal. Need to start OTC Miralax once a day.

## 2017-03-09 NOTE — Telephone Encounter (Signed)
Pt advised.   Thanks,   -Mercedies Ganesh  

## 2017-03-28 ENCOUNTER — Telehealth: Payer: Self-pay | Admitting: Family Medicine

## 2017-03-28 ENCOUNTER — Ambulatory Visit: Payer: Self-pay | Admitting: Podiatry

## 2017-04-22 ENCOUNTER — Ambulatory Visit (INDEPENDENT_AMBULATORY_CARE_PROVIDER_SITE_OTHER): Payer: Medicare Other

## 2017-04-22 VITALS — BP 132/86 | HR 60 | Temp 97.7°F | Ht 70.0 in | Wt 183.6 lb

## 2017-04-22 DIAGNOSIS — Z0000 Encounter for general adult medical examination without abnormal findings: Secondary | ICD-10-CM | POA: Diagnosis not present

## 2017-04-22 NOTE — Patient Instructions (Signed)
Jared Mullen , Thank you for taking time to come for your Medicare Wellness Visit. I appreciate your ongoing commitment to your health goals. Please review the following plan we discussed and let me know if I can assist you in the future.   Screening recommendations/referrals: Colonoscopy:completed 01/10/17 Recommended yearly ophthalmology/optometry visit for glaucoma screening and checkup Recommended yearly dental visit for hygiene and checkup  Vaccinations: Influenza vaccine: due 06/2017 Pneumococcal vaccine: completed series Tdap vaccine: declined Shingles vaccine: declined  Advanced directives: Advance directive discussed with you today. Even though you declined this today please call our office should you change your mind and we can give you the proper paperwork for you to fill out.  Conditions/risks identified: Recommend increasing water intake to 4-6 glasses a day.   Next appointment: 04/29/17 @ 10 AM  Preventive Care 65 Years and Older, Male Preventive care refers to lifestyle choices and visits with your health care provider that can promote health and wellness. What does preventive care include?  A yearly physical exam. This is also called an annual well check.  Dental exams once or twice a year.  Routine eye exams. Ask your health care provider how often you should have your eyes checked.  Personal lifestyle choices, including:  Daily care of your teeth and gums.  Regular physical activity.  Eating a healthy diet.  Avoiding tobacco and drug use.  Limiting alcohol use.  Practicing safe sex.  Taking low doses of aspirin every day.  Taking vitamin and mineral supplements as recommended by your health care provider. What happens during an annual well check? The services and screenings done by your health care provider during your annual well check will depend on your age, overall health, lifestyle risk factors, and family history of disease. Counseling  Your  health care provider may ask you questions about your:  Alcohol use.  Tobacco use.  Drug use.  Emotional well-being.  Home and relationship well-being.  Sexual activity.  Eating habits.  History of falls.  Memory and ability to understand (cognition).  Work and work Statistician. Screening  You may have the following tests or measurements:  Height, weight, and BMI.  Blood pressure.  Lipid and cholesterol levels. These may be checked every 5 years, or more frequently if you are over 36 years old.  Skin check.  Lung cancer screening. You may have this screening every year starting at age 58 if you have a 30-pack-year history of smoking and currently smoke or have quit within the past 15 years.  Fecal occult blood test (FOBT) of the stool. You may have this test every year starting at age 58.  Flexible sigmoidoscopy or colonoscopy. You may have a sigmoidoscopy every 5 years or a colonoscopy every 10 years starting at age 43.  Prostate cancer screening. Recommendations will vary depending on your family history and other risks.  Hepatitis C blood test.  Hepatitis B blood test.  Sexually transmitted disease (STD) testing.  Diabetes screening. This is done by checking your blood sugar (glucose) after you have not eaten for a while (fasting). You may have this done every 1-3 years.  Abdominal aortic aneurysm (AAA) screening. You may need this if you are a current or former smoker.  Osteoporosis. You may be screened starting at age 23 if you are at high risk. Talk with your health care provider about your test results, treatment options, and if necessary, the need for more tests. Vaccines  Your health care provider may recommend certain vaccines, such  as:  Influenza vaccine. This is recommended every year.  Tetanus, diphtheria, and acellular pertussis (Tdap, Td) vaccine. You may need a Td booster every 10 years.  Zoster vaccine. You may need this after age  22.  Pneumococcal 13-valent conjugate (PCV13) vaccine. One dose is recommended after age 10.  Pneumococcal polysaccharide (PPSV23) vaccine. One dose is recommended after age 84. Talk to your health care provider about which screenings and vaccines you need and how often you need them. This information is not intended to replace advice given to you by your health care provider. Make sure you discuss any questions you have with your health care provider. Document Released: 10/31/2015 Document Revised: 06/23/2016 Document Reviewed: 08/05/2015 Elsevier Interactive Patient Education  2017 [employer] Prevention in the Home Falls can cause injuries. They can happen to people of all ages. There are many things you can do to make your home safe and to help prevent falls. What can I do on the outside of my home?  Regularly fix the edges of walkways and driveways and fix any cracks.  Remove anything that might make you trip as you walk through a door, such as a raised step or threshold.  Trim any bushes or trees on the path to your home.  Use bright outdoor lighting.  Clear any walking paths of anything that might make someone trip, such as rocks or tools.  Regularly check to see if handrails are loose or broken. Make sure that both sides of any steps have handrails.  Any raised decks and porches should have guardrails on the edges.  Have any leaves, snow, or ice cleared regularly.  Use sand or salt on walking paths during winter.  Clean up any spills in your garage right away. This includes oil or grease spills. What can I do in the bathroom?  Use night lights.  Install grab bars by the toilet and in the tub and shower. Do not use towel bars as grab bars.  Use non-skid mats or decals in the tub or shower.  If you need to sit down in the shower, use a plastic, non-slip stool.  Keep the floor dry. Clean up any water that spills on the floor as soon as it happens.  Remove  soap buildup in the tub or shower regularly.  Attach bath mats securely with double-sided non-slip rug tape.  Do not have throw rugs and other things on the floor that can make you trip. What can I do in the bedroom?  Use night lights.  Make sure that you have a light by your bed that is easy to reach.  Do not use any sheets or blankets that are too big for your bed. They should not hang down onto the floor.  Have a firm chair that has side arms. You can use this for support while you get dressed.  Do not have throw rugs and other things on the floor that can make you trip. What can I do in the kitchen?  Clean up any spills right away.  Avoid walking on wet floors.  Keep items that you use a lot in easy-to-reach places.  If you need to reach something above you, use a strong step stool that has a grab bar.  Keep electrical cords out of the way.  Do not use floor polish or wax that makes floors slippery. If you must use wax, use non-skid floor wax.  Do not have throw rugs and other things on the  floor that can make you trip. What can I do with my stairs?  Do not leave any items on the stairs.  Make sure that there are handrails on both sides of the stairs and use them. Fix handrails that are broken or loose. Make sure that handrails are as long as the stairways.  Check any carpeting to make sure that it is firmly attached to the stairs. Fix any carpet that is loose or worn.  Avoid having throw rugs at the top or bottom of the stairs. If you do have throw rugs, attach them to the floor with carpet tape.  Make sure that you have a light switch at the top of the stairs and the bottom of the stairs. If you do not have them, ask someone to add them for you. What else can I do to help prevent falls?  Wear shoes that:  Do not have high heels.  Have rubber bottoms.  Are comfortable and fit you well.  Are closed at the toe. Do not wear sandals.  If you use a  stepladder:  Make sure that it is fully opened. Do not climb a closed stepladder.  Make sure that both sides of the stepladder are locked into place.  Ask someone to hold it for you, if possible.  Clearly mark and make sure that you can see:  Any grab bars or handrails.  First and last steps.  Where the edge of each step is.  Use tools that help you move around (mobility aids) if they are needed. These include:  Canes.  Walkers.  Scooters.  Crutches.  Turn on the lights when you go into a dark area. Replace any light bulbs as soon as they burn out.  Set up your furniture so you have a clear path. Avoid moving your furniture around.  If any of your floors are uneven, fix them.  If there are any pets around you, be aware of where they are.  Review your medicines with your doctor. Some medicines can make you feel dizzy. This can increase your chance of falling. Ask your doctor what other things that you can do to help prevent falls. This information is not intended to replace advice given to you by your health care provider. Make sure you discuss any questions you have with your health care provider. Document Released: 07/31/2009 Document Revised: 03/11/2016 Document Reviewed: 11/08/2014 Elsevier Interactive Patient Education  2017 [employer].

## 2017-04-22 NOTE — Progress Notes (Signed)
Subjective:   Jared Mullen is a 76 y.o. male who presents for Medicare Annual/Subsequent preventive examination.  Review of Systems:  N/A  Cardiac Risk Factors include: advanced age (>26men, >65 women);male gender;hypertension     Objective:    Vitals: BP 132/86 (BP Location: Left Arm)   Pulse 60   Temp 97.7 F (36.5 C) (Oral)   Ht 5\' 10"  (1.778 m)   Wt 183 lb 9.6 oz (83.3 kg)   BMI 26.34 kg/m   Body mass index is 26.34 kg/m.  Tobacco History  Smoking Status  . Former Smoker  . Packs/day: 2.00  . Years: 28.00  . Types: Cigarettes  . Quit date: 10/18/1980  Smokeless Tobacco  . Never Used     Counseling given: Not Answered   Past Medical History:  Diagnosis Date  . Cataract immature right eye   . History of radiation therapy 12/21/11 to 01/24/12   prostate  . Nocturia   . Peyronie's disease   . 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 Sleep apnea   . Urinary frequency   . Weak urinary stream    Past Surgical History:  Procedure Laterality Date  . biospy  09/30/2011-   TRUS/Bx Prostate - 5/12/biopsies positive for cancer, glandular size 32.5 cc  . CHOLECYSTECTOMY  1992  . RADIOACTIVE SEED IMPLANT  02/09/2012   Procedure: RADIOACTIVE SEED IMPLANT;  Surgeon: Franchot Gallo, MD;  [facility];  Service: Urology;  Laterality: N/A;  RAD TECH OK PER ANNE AT MAIN OR   . UVULOPALATOPHARYNGOPLASTY     Family History  Problem Relation Age of Onset  . Heart disease Father   . Breast cancer Sister   . Cancer Sister        lung  . Lung cancer Brother   . Cancer Brother        bladder  . Lung cancer Sister   . Cancer Sister        breast  . Kidney disease Brother    History  Sexual Activity  . Sexual activity: Not on file    Comment: Low libido    Outpatient Encounter Prescriptions as of 04/22/2017  Medication Sig  . [DISCONTINUED] fluticasone (FLONASE) 50 MCG/ACT nasal spray Place 1 spray into both nostrils 2 (two) times  daily. (Patient not taking: Reported on 03/07/2017)   No facility-administered encounter medications on file as of 04/22/2017.     Activities of Daily Living In your present state of health, do you have any difficulty performing the following activities: 04/22/2017 04/28/2016  Hearing? N N  Vision? N N  Difficulty concentrating or making decisions? N N  Walking or climbing stairs? N N  Dressing or bathing? N N  Doing errands, shopping? N N  Preparing Food and eating ? N -  Using the Toilet? N -  In the past six months, have you accidently leaked urine? N -  Do you have problems with loss of bowel control? N -  Managing your Medications? N -  Managing your Finances? N -  Housekeeping or managing your Housekeeping? N -  Some recent data might be hidden    Patient Care Team: Birdie Sons, MD as PCP - General (Family Medicine) Franchot Gallo, MD as Consulting Physician (Urology)   Assessment:     Exercise Activities and Dietary recommendations Current Exercise Habits: Structured exercise class, Type of exercise: strength training/weights;stretching;walking, Time (Minutes): 60, Frequency (Times/Week): 5, Weekly Exercise (Minutes/Week): 300,  Intensity: Moderate, Exercise limited by: None identified  Goals    . Increase water intake          Recommend increasing water intake to 4-6 glasses a day.       Fall Risk Fall Risk  04/22/2017 04/28/2016 04/28/2015 04/28/2015  Falls in the past year? No No No No   Depression Screen PHQ 2/9 Scores 04/22/2017 04/22/2017 04/28/2016 04/28/2015  PHQ - 2 Score 0 0 0 0  PHQ- 9 Score 0 - - -    Cognitive Function     6CIT Screen 04/22/2017  What Year? 0 points  What month? 0 points  What time? 0 points  Count back from 20 0 points  Months in reverse 0 points  Repeat phrase 0 points  Total Score 0    Immunization History  Administered Date(s) Administered  . Influenza, High Dose Seasonal PF 09/29/2016  . Pneumococcal Conjugate-13  02/27/2014  . Pneumococcal Polysaccharide-23 08/23/2011   Screening Tests Health Maintenance  Topic Date Due  . TETANUS/TDAP  10/18/2026 (Originally 12/17/1959)  . INFLUENZA VACCINE  05/18/2017  . COLONOSCOPY  01/10/2022  . PNA vac Low Risk Adult  Completed      Plan:  I have personally reviewed and addressed the Medicare Annual Wellness questionnaire and have noted the following in the patient's chart:  A. Medical and social history B. Use of alcohol, tobacco or illicit drugs  C. Current medications and supplements D. Functional ability and status E.  Nutritional status F.  Physical activity G. Advance directives H. List of other physicians I.  Hospitalizations, surgeries, and ER visits in previous 12 months J.  [place] such as hearing and vision if needed, cognitive and depression L. Referrals and appointments - none  In addition, I have reviewed and discussed with patient certain preventive protocols, quality metrics, and best practice recommendations. A written personalized care plan for preventive services as well as general preventive health recommendations were provided to patient.  See attached scanned questionnaire for additional information.   Signed,  Fabio Neighbors, LPN Nurse Health Advisor   MD Recommendations: None. Pt declined tetanus vaccine today.

## 2017-04-29 ENCOUNTER — Ambulatory Visit (INDEPENDENT_AMBULATORY_CARE_PROVIDER_SITE_OTHER): Payer: Medicare Other | Admitting: Family Medicine

## 2017-04-29 VITALS — BP 132/76 | HR 72 | Temp 97.7°F | Resp 12 | Wt 184.0 lb

## 2017-04-29 DIAGNOSIS — C61 Malignant neoplasm of prostate: Secondary | ICD-10-CM | POA: Diagnosis not present

## 2017-04-29 DIAGNOSIS — Z0000 Encounter for general adult medical examination without abnormal findings: Secondary | ICD-10-CM | POA: Diagnosis not present

## 2017-04-29 NOTE — Progress Notes (Signed)
Patient: Jared Mullen, Male    DOB: [birth year], 76 y.o.   MRN: [id number] Visit Date: 04/29/2017  Today's Provider: Lelon Huh, MD   Chief Complaint  Patient presents with  . Annual Exam   Subjective:    Annual physical exam Jared Mullen is a 76 y.o. male who presents today for health maintenance and complete physical. He feels well. He reports exercising goes to the gym for an hour 5 days a week. He reports he is sleeping fairly well.  Patient had a visit with [facility] for Wellness visit on 04/22/17. Immunization History  Administered Date(s) Administered  . Influenza, High Dose Seasonal PF 09/29/2016  . Pneumococcal Conjugate-13 02/27/2014  . Pneumococcal Polysaccharide-23 08/23/2011  Patient states he is not sure how long ago he had Tetanus immunization. Patient does not want to get Tdap due to the possible cost. Last colonoscopy 12/28/16-tubular adenoma  Review of Systems  Constitutional: Negative.   Respiratory: Negative.   Cardiovascular: Negative.   Gastrointestinal:       "nervous stomach"  Musculoskeletal: Positive for arthralgias (right heel pain).  Neurological: Negative.     Social History      He  reports that he quit smoking about 36 years ago. His smoking use included Cigarettes. He has a 56.00 pack-year smoking history. He has never used smokeless tobacco. He reports that he does not drink alcohol or use drugs.       Social History   Social History  . Marital status: Married    Spouse name: N/A  . Number of children: N/A  . Years of education: N/A   Social History Main Topics  . Smoking status: Former Smoker    Packs/day: 2.00    Years: 28.00    Types: Cigarettes    Quit date: 10/18/1980  . Smokeless tobacco: Never Used  . Alcohol use No  . Drug use: No  . Sexual activity: Not on file     Comment: Low libido   Other Topics Concern  . Not on file   Social History Narrative  . No narrative on file    Past Medical History:  Diagnosis  Date  . Cataract immature right eye   . History of radiation therapy 12/21/11 to 01/24/12   prostate  . Nocturia   . Peyronie's disease   . 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 Sleep apnea   . Urinary frequency   . Weak urinary stream      Patient Active Problem List   Diagnosis Date Noted  . 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  . 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 H/O adenomatous polyp of colon 11/18/2011  . Prostate cancer ([place]) 11/04/2011    Past Surgical History:  Procedure Laterality Date  . biospy  09/30/2011-   TRUS/Bx Prostate - 5/12/biopsies positive for cancer, glandular size 32.5 cc  . CHOLECYSTECTOMY  1992  . RADIOACTIVE SEED IMPLANT  02/09/2012   Procedure: RADIOACTIVE SEED IMPLANT;  Surgeon: Franchot Gallo, MD;  Location[facility];  Service: Urology;  Laterality: N/A;  RAD TECH OK PER ANNE AT MAIN OR   . UVULOPALATOPHARYNGOPLASTY      Family History        Family Status  Relation Status  . Father Deceased at age 25       MI  . Sister Alive  . Brother Alive  . Sister Alive  . Brother (Not Specified)  Renal Failure  . Mother Deceased at age 5       Unknown Causes        His family history includes Breast cancer in his sister; Cancer in his brother, sister, and sister; Heart disease in his father; Kidney disease in his brother; Lung cancer in his brother and sister.     No Known Allergies  No current outpatient prescriptions on file.   Patient Care Team: Birdie Sons, MD as PCP - General (Family Medicine) Franchot Gallo, MD as Consulting Physician (Urology)      Objective:   Vitals: BP 132/76   Pulse 72   Temp 97.7 F (36.5 C)   Resp 12   Wt 184 lb (83.5 kg)   BMI 26.40 kg/m    Vitals:   04/29/17 0953  BP: 132/76  Pulse: 72  Resp: 12  Temp: 97.7 F (36.5 C)  Weight: 184 lb (83.5 kg)     Physical Exam   General Appearance:    Alert, cooperative, no distress, appears stated  age  Head:    Normocephalic, without obvious abnormality, atraumatic  Eyes:    PERRL, conjunctiva/corneas clear, EOM's intact, fundi    benign, both eyes       Ears:    Normal TM's and external ear canals, both ears  Nose:   Nares normal, septum midline, mucosa normal, no drainage   or sinus tenderness  Throat:   Lips, mucosa, and tongue normal; teeth and gums normal  Neck:   Supple, symmetrical, trachea midline, no adenopathy;       thyroid:  No enlargement/tenderness/nodules; no carotid   bruit or JVD  Back:     Symmetric, no curvature, ROM normal, no CVA tenderness  Lungs:     Clear to auscultation bilaterally, respirations unlabored  Chest wall:    No tenderness or deformity  Heart:    Regular rate and rhythm, S1 and S2 normal, no murmur, rub   or gallop  Abdomen:     Soft, non-tender, bowel sounds active all four quadrants,    no masses, no organomegaly  Genitalia:    deferred  Rectal:    deferred  Extremities:   Extremities normal, atraumatic, no cyanosis or edema  Pulses:   2+ and symmetric all extremities  Skin:   Skin color, texture, turgor normal, no rashes or lesions  Lymph nodes:   Cervical, supraclavicular, and axillary nodes normal  Neurologic:   CNII-XII intact. Normal strength, sensation and reflexes      throughout     Depression Screen PHQ 2/9 Scores 04/22/2017 04/22/2017 04/28/2016 04/28/2015  PHQ - 2 Score 0 0 0 0  PHQ- 9 Score 0 - - -   Goals    . Increase water intake          Recommend increasing water intake to 4-6 glasses a day.       Discussed health benefits of physical activity, and encouraged him to engage in regular exercise appropriate for his age and condition.       Assessment & Plan:      1. Annual physical exam Doing well. Exercising frequently. Counseled regarding Tdap and Shingrix. He is going to check with pharmacist about cost of vaccines.   2. Prostate cancer [facility]) He anticipates follow up with Dr. Beatrix Fetters and declined PSA  today.      Lelon Huh, MD  [facility]

## 2017-04-29 NOTE — Patient Instructions (Signed)
   The CDC recommends two doses of Shingrix separated by 2 to 6 months for adults age 78 years and older. I recommend checking with your insurance plan regarding coverage for this vaccine.    You are also due for Tetanus vaccine (Tdap) vaccine.

## 2017-07-18 NOTE — Telephone Encounter (Signed)
AWV completed °

## 2017-08-06 IMAGING — US US ABDOMEN COMPLETE
1 series · 13 of 25 positions shown · non-contrast
Comparison: None.

CLINICAL DATA: Abdominal pain with nausea

EXAM:
ABDOMEN ULTRASOUND COMPLETE

[Series 1: us abdomen complete · 0.22mm/px · 13 of 96 slices shown]
[im 1/96]
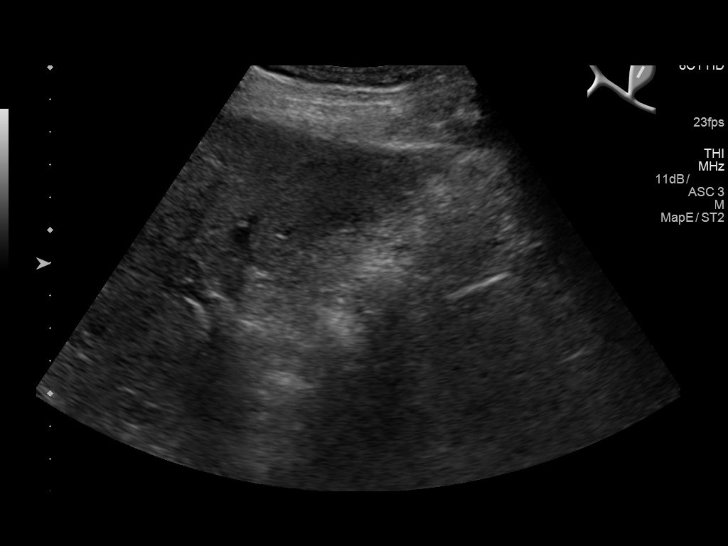
[im 8/96]
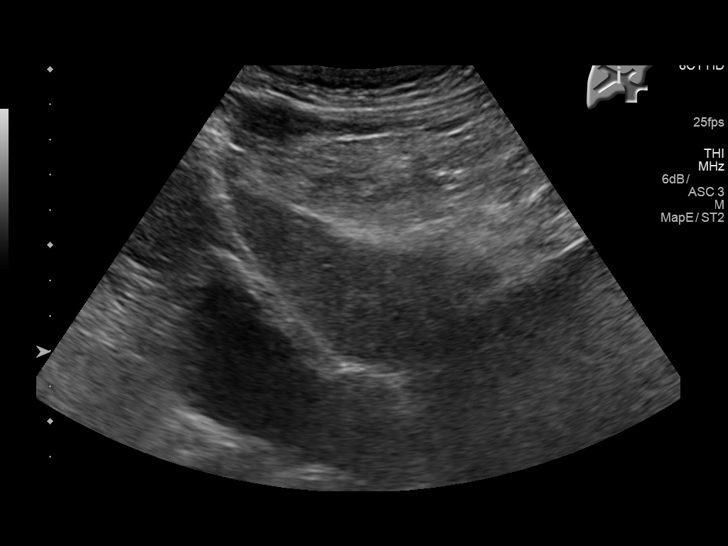
[im 16/96]
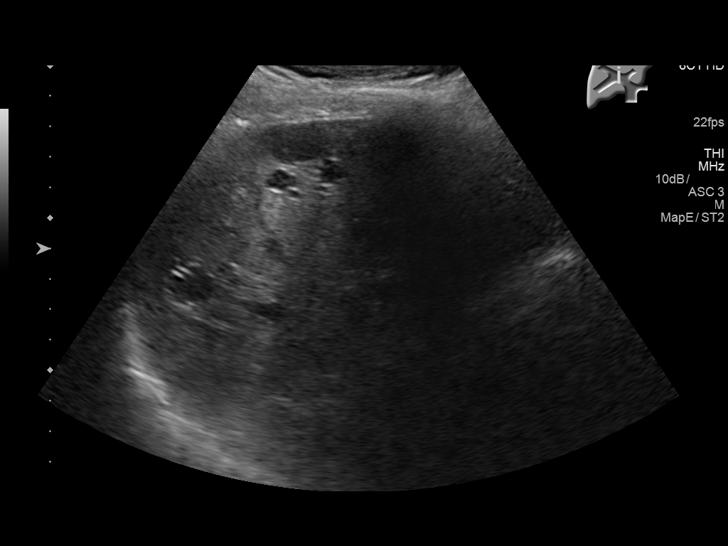
[im 24/96]
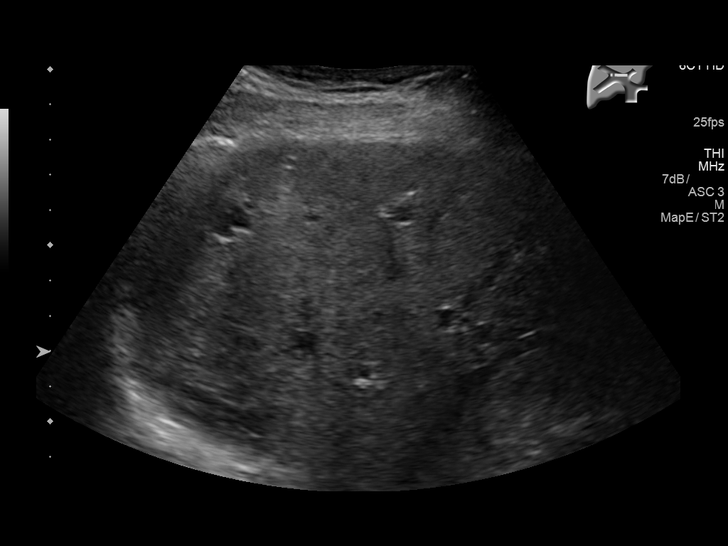
[im 32/96]
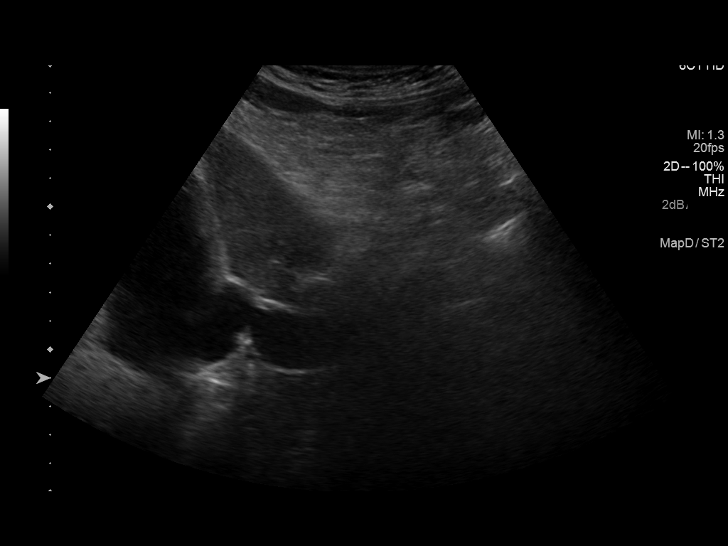
[im 40/96]
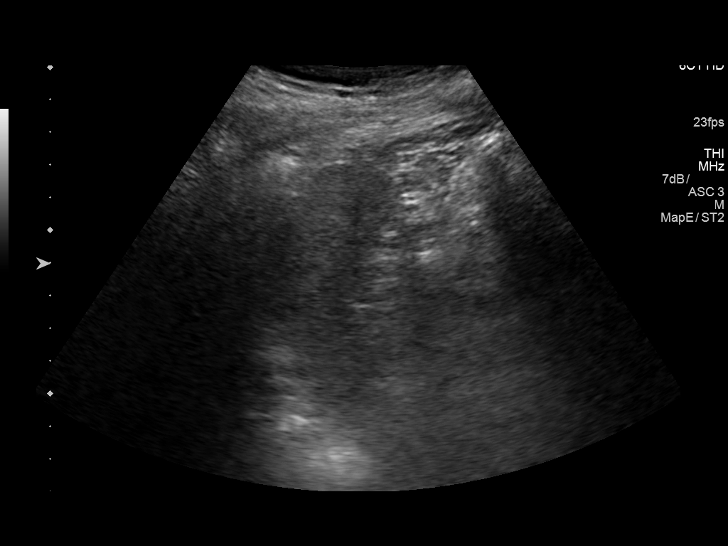
[im 48/96]
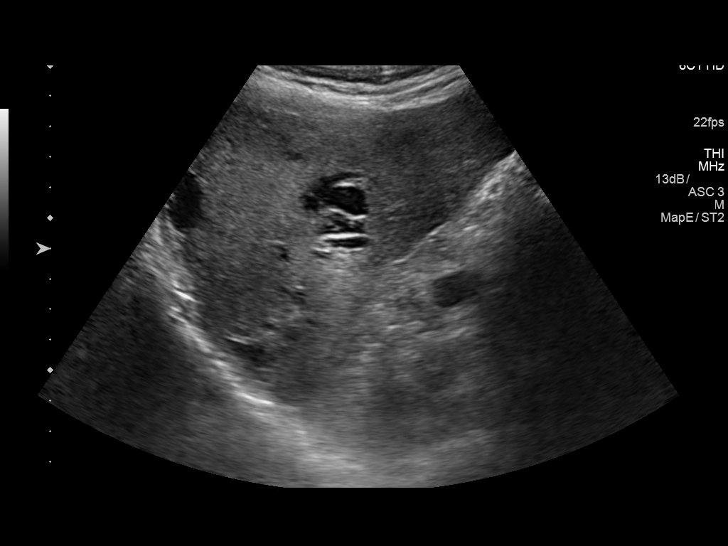
[im 56/96]
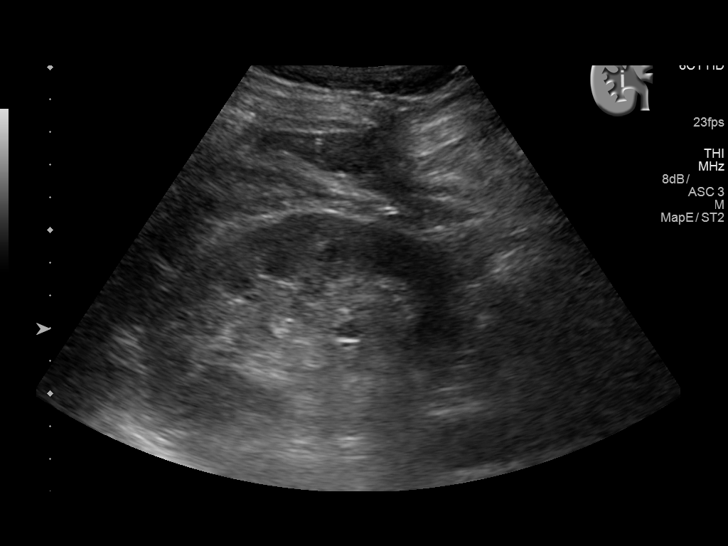
[im 64/96]
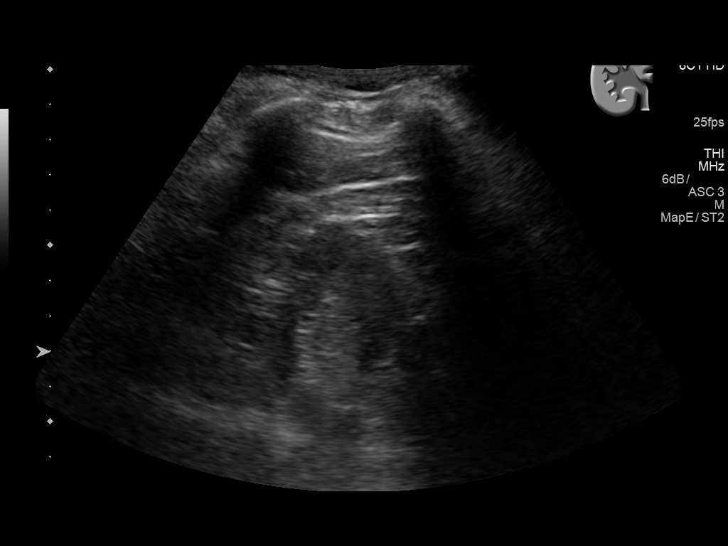
[im 72/96]
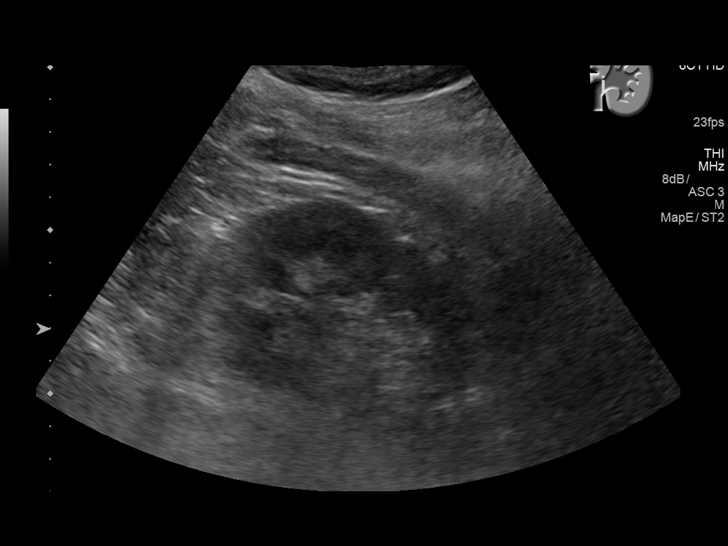
[im 80/96]
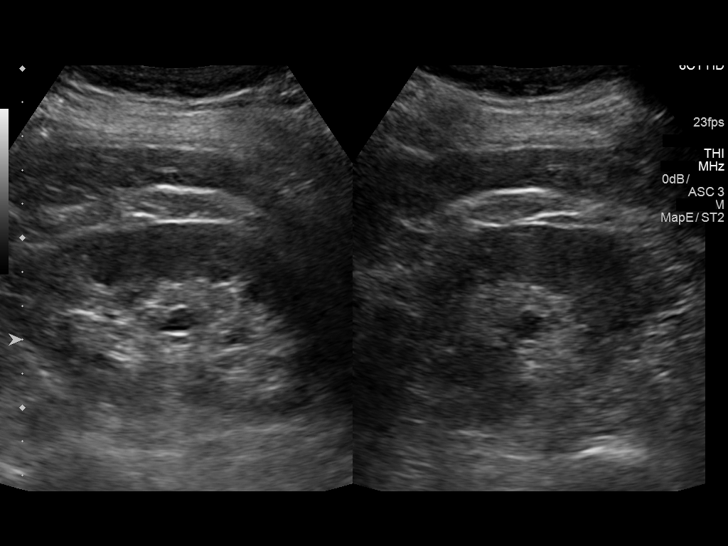
[im 88/96]
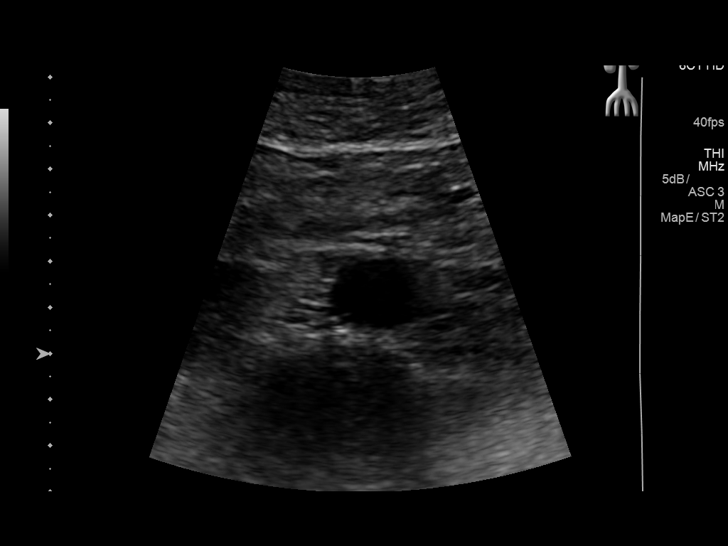
[im 96/96]
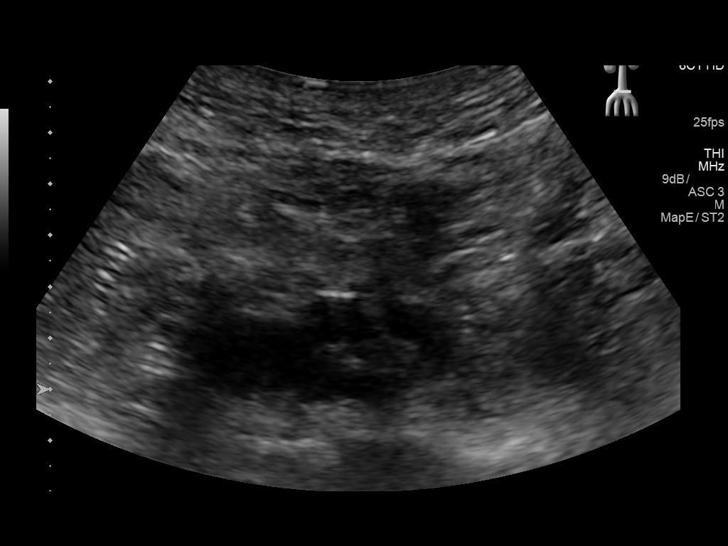

[13 of 25 positions shown; findings below may reference images not displayed]

FINDINGS: Gallbladder: Surgically absent.

Common bile duct: Diameter: 10 mm proximally with mild tapering
distally. No mass or calculus evident.

Liver: Scattered cystic areas are noted throughout the liver.
Largest cyst is located in the anterior segment right lobe measuring
2.2 x 1.7 x 2.0 cm. Most cysts are subcentimeter. No noncystic areas
are demonstrable in the liver. The liver has a subtly nodular
contour. The liver echogenicity is somewhat inhomogeneous.

IVC: No abnormality visualized.

Pancreas: Visualized portion unremarkable. Portions of pancreas
obscured by gas.

Spleen: Size and appearance within normal limits.

Right Kidney: Length: 11.1 cm. Echogenicity within normal limits. No
hydronephrosis visualized. There are small parapelvic cysts, largest
measuring 0.8 x 0.8 x 1.0 cm.

Left Kidney: Length: 10.9 cm. Echogenicity within normal limits. No
hydronephrosis visualized. There are several small parapelvic cysts,
largest measuring 0.8 x 0.8 x 1.0 cm.

Abdominal aorta: No aneurysm visualized. Atherosclerotic plaque is
noted in the aorta.

Other findings: No demonstrable ascites.
IMPRESSION: Gallbladder absent. Common bile duct upper limits of normal for post
cholecystectomy state without mass or calculus demonstrated in the
biliary ductal system.

Liver contour is concerning for underlying parenchymal liver disease
such as cirrhosis. Somewhat inhomogeneous echotexture of the liver
likely is due to hepatic steatosis, possibly superimposed on chronic
parenchymal liver disease. There are sick cysts scattered in the
liver, largest measuring approximately 2 cm in the anterior segment
right lobe region. No non- cystic liver lesions are appreciable. It
must be cautioned that the sensitivity of ultrasound for detection
of focal liver lesions is diminished in this circumstance.

Portions of pancreas obscured by gas. Visualized portions of
pancreas appear unremarkable.

Small parapelvic cysts in each kidney. No obstructing focus in each
kidney.

There is aortic atherosclerosis.

## 2017-10-24 ENCOUNTER — Ambulatory Visit (INDEPENDENT_AMBULATORY_CARE_PROVIDER_SITE_OTHER): Payer: Medicare Other | Admitting: Family Medicine

## 2017-10-24 ENCOUNTER — Encounter: Payer: Self-pay | Admitting: Family Medicine

## 2017-10-24 VITALS — BP 138/90 | HR 61 | Temp 97.5°F | Resp 16 | Wt 191.0 lb

## 2017-10-24 DIAGNOSIS — L723 Sebaceous cyst: Secondary | ICD-10-CM | POA: Diagnosis not present

## 2017-10-24 MED ORDER — NAPROXEN 500 MG PO TABS: 500.0000 mg | ORAL_TABLET | Freq: Two times a day (BID) | ORAL | 0 refills | Status: AC

## 2017-10-24 MED ORDER — CYCLOBENZAPRINE HCL 5 MG PO TABS: 5.0000 mg | ORAL_TABLET | Freq: Every day | ORAL | 0 refills | Status: AC

## 2017-10-24 NOTE — Progress Notes (Signed)
       Patient: Jared Mullen Male    DOB: 08-23-1941   76 y.o.   MRN: [id number] Visit Date: 10/24/2017  Today's Provider: Lelon Huh, MD   Chief Complaint  Patient presents with  . Neck Pain   Subjective:    Neck Pain   This is a new problem. Episode onset: 2 months ago; woke up one morning with neck pain. The problem occurs constantly (severity of neck pain varies). The problem has been unchanged. The pain is associated with nothing. The pain is present in the left side. The quality of the pain is described as aching. Exacerbated by: turning head to the left side. Pertinent negatives include no chest pain, fever, headaches, leg pain, numbness, pain with swallowing, paresis, photophobia, syncope, tingling, trouble swallowing, visual change, weakness or weight loss. He has tried acetaminophen, heat and ice for the symptoms. The treatment provided no relief.       No Known Allergies  No current outpatient medications on file.  Review of Systems  Constitutional: Negative for appetite change, chills, fever and weight loss.  HENT: Negative for trouble swallowing.   Eyes: Negative for photophobia.  Respiratory: Negative for chest tightness, shortness of breath and wheezing.   Cardiovascular: Negative for chest pain, palpitations and syncope.  Gastrointestinal: Negative for abdominal pain, nausea and vomiting.  Musculoskeletal: Positive for neck pain.  Neurological: Negative for tingling, weakness, numbness and headaches.    Social History   Tobacco Use  . Smoking status: Former Smoker    Packs/day: 2.00    Years: 28.00    Pack years: 56.00    Types: Cigarettes    Last attempt to quit: 10/18/1980    Years since quitting: 37.0  . Smokeless tobacco: Never Used  Substance Use Topics  . Alcohol use: No   Objective:   BP 138/90 (BP Location: Left Arm, Patient Position: Sitting, Cuff Size: Large)   Pulse 61   Temp (!) 97.5 F (36.4 C) (Oral)   Resp 16   Wt 191 lb (86.6 kg)    SpO2 99% Comment: room air  BMI 27.41 kg/m  Vitals:   10/24/17 0840  BP: 138/90  Pulse: 61  Resp: 16  Temp: (!) 97.5 F (36.4 C)  TempSrc: Oral  SpO2: 99%  Weight: 191 lb (86.6 kg)     Physical Exam  General appearance: alert, well developed, well nourished, cooperative and in no distress Head: Normocephalic, without obvious abnormality, atraumatic Respiratory: Respirations even and unlabored, normal respiratory rate Extremities: Tender fluctuate vague grape sized mass occiput left of midline. No rash or erythema.     Assessment & Plan:     1. Sebaceous cyst (suspected) Trial of naprosyn BID. May be a component of muscle spasm. Will also take cyclobenzaprine at bedtime.   He is to return if not completely resolved when finished with naprosyn and cyclobenzaprine.   Recommended flu vaccine which he refused.       Lelon Huh, MD  [facility]

## 2017-10-24 NOTE — Patient Instructions (Signed)
.   I recommend that you get a flu vaccine this year. Please call our office at [phone] at your earliest convenience to schedule a flu shot.    

## 2017-12-02 ENCOUNTER — Telehealth: Payer: Self-pay | Admitting: Family Medicine

## 2017-12-02 DIAGNOSIS — C61 Malignant neoplasm of prostate: Secondary | ICD-10-CM

## 2017-12-02 NOTE — Telephone Encounter (Signed)
Please advise 

## 2017-12-02 NOTE — Telephone Encounter (Signed)
Pt is requesting a lab slip to have his PSA level checked. Please advise. Thanks TNP

## 2017-12-06 NOTE — Telephone Encounter (Signed)
OK, please printed pended order and leave at lab for patient.

## 2017-12-06 NOTE — Telephone Encounter (Signed)
Lab slip printed and ready to pick-up. Patient was advised.

## 2017-12-07 LAB — PSA

## 2017-12-27 ENCOUNTER — Encounter: Payer: Self-pay | Admitting: *Deleted

## 2017-12-27 ENCOUNTER — Ambulatory Visit (INDEPENDENT_AMBULATORY_CARE_PROVIDER_SITE_OTHER): Payer: Medicare Other | Admitting: Family Medicine

## 2017-12-27 ENCOUNTER — Encounter: Payer: Self-pay | Admitting: Family Medicine

## 2017-12-27 VITALS — BP 128/84 | HR 68 | Temp 98.0°F | Resp 16 | Wt 181.6 lb

## 2017-12-27 DIAGNOSIS — R109 Unspecified abdominal pain: Secondary | ICD-10-CM | POA: Diagnosis not present

## 2017-12-27 LAB — POCT URINALYSIS DIPSTICK
Bilirubin, UA: NEGATIVE
GLUCOSE UA: NEGATIVE
Ketones, UA: NEGATIVE
LEUKOCYTES UA: NEGATIVE
NITRITE UA: NEGATIVE
PH UA: 6.5 (ref 5.0–8.0)
SPEC GRAV UA: 1.025 (ref 1.010–1.025)
UROBILINOGEN UA: 0.2 U/dL

## 2017-12-27 NOTE — Progress Notes (Signed)
Patient: Jared Mullen Male    DOB: 04-May-1941   77 y.o.   MRN: [id number] Visit Date: 12/27/2017  Today's Provider: Lelon Huh, MD   Chief Complaint  Patient presents with  . Nausea   Subjective:    Patient states he has been having nausea and abdominal pain after he eats. Patient also states he has been having dark stool during this period. Patient states after he eats his stomach will become bloated. Abdominal pain is in left lower quadrant. Patient also reports that he had a sudden weight loss over the last few weeks.    Abdominal Pain  This is a new problem. The current episode started more than 1 month ago. The onset quality is gradual. The problem occurs intermittently. The problem has been unchanged. The pain is located in the RLQ. The pain is at a severity of 5/10. The pain is moderate. The quality of the pain is sharp. The abdominal pain does not radiate. Associated symptoms include belching, flatus, nausea and weight loss. Pertinent negatives include no anorexia, arthralgias, constipation, diarrhea, dysuria, fever, frequency, headaches, hematochezia, hematuria, melena or vomiting. The pain is aggravated by eating. The pain is relieved by nothing. He has tried nothing for the symptoms.   He had upper abdominal pains last year and had MRI revealing fatty liver, but no other abnormalities. He subsequent had EGD and colonoscopy by Dr. Paulita Fujita. He does have a history of prostate cancer but PSA was recently undetectable on 12/06/2017  Wt Readings from Last 3 Encounters:  12/27/17 181 lb 9.6 oz (82.4 kg)  10/24/17 191 lb (86.6 kg)  04/29/17 184 lb (83.5 kg)         No Known Allergies  No current outpatient medications on file.  Review of Systems  Constitutional: Positive for unexpected weight change and weight loss. Negative for appetite change, chills and fever.  Respiratory: Negative for chest tightness, shortness of breath and wheezing.   Cardiovascular: Negative for  chest pain and palpitations.  Gastrointestinal: Positive for abdominal distention, flatus and nausea. Negative for abdominal pain, anorexia, constipation, diarrhea, hematochezia, melena and vomiting.  Genitourinary: Negative for dysuria, frequency and hematuria.  Musculoskeletal: Negative for arthralgias.  Neurological: Negative for headaches.    Social History   Tobacco Use  . Smoking status: Former Smoker    Packs/day: 2.00    Years: 28.00    Pack years: 56.00    Types: Cigarettes    Last attempt to quit: 10/18/1980    Years since quitting: 37.2  . Smokeless tobacco: Never Used  Substance Use Topics  . Alcohol use: No   Objective:   BP 128/84 (BP Location: Right Arm, Patient Position: Sitting, Cuff Size: Large)   Pulse 68   Temp 98 F (36.7 C) (Oral)   Resp 16   Wt 181 lb 9.6 oz (82.4 kg)   SpO2 96%   BMI 26.06 kg/m  Vitals:   12/27/17 1630  BP: 128/84  Pulse: 68  Resp: 16  Temp: 98 F (36.7 C)  TempSrc: Oral  SpO2: 96%  Weight: 181 lb 9.6 oz (82.4 kg)     Physical Exam  General Appearance:    Alert, cooperative, no distress  Eyes:    PERRL, conjunctiva/corneas clear, EOM's intact       Lungs:     Clear to auscultation bilaterally, respirations unlabored  Heart:    Regular rate and rhythm  Abdomen:   bowel sounds present and normal in all  4 quadrants, soft, round, nontender or nondistended. No CVA tenderness. No palpable hernias.    Results for orders placed or performed in visit on 12/27/17  POCT Urinalysis Dipstick  Result Value Ref Range   Color, UA yellow    Clarity, UA clear    Glucose, UA negative    Bilirubin, UA negative    Ketones, UA negative    Spec Grav, UA 1.025 1.010 - 1.025   Blood, UA Trace (Non-hemolyzed)    pH, UA 6.5 5.0 - 8.0   Protein, UA Trace    Urobilinogen, UA 0.2 0.2 or 1.0 E.U./dL   Nitrite, UA negative    Leukocytes, UA Negative Negative   Appearance     Odor          Assessment & Plan:     1. Abdominal pain,  unspecified abdominal location Has been under a year since last endoscopy and colonoscopy. Check labs and u/s   - CBC - Comprehensive metabolic panel - Amylase - US Abdomen Complete; Future - POCT Urinalysis Dipstick       Lelon Huh, MD  [facility]

## 2017-12-29 LAB — CBC
Hematocrit: 45.5 % (ref 37.5–51.0)
Hemoglobin: 15.1 g/dL (ref 13.0–17.7)
MCH: 30.6 pg (ref 26.6–33.0)
MCHC: 33.2 g/dL (ref 31.5–35.7)
MCV: 92 fL (ref 79–97)
PLATELETS: 273 10*3/uL (ref 150–379)
RBC: 4.93 x10E6/uL (ref 4.14–5.80)
RDW: 14.2 % (ref 12.3–15.4)
WBC: 5.2 10*3/uL (ref 3.4–10.8)

## 2017-12-29 LAB — COMPREHENSIVE METABOLIC PANEL
A/G RATIO: 1.4 (ref 1.2–2.2)
ALK PHOS: 66 IU/L (ref 39–117)
ALT: 11 IU/L (ref 0–44)
AST: 18 IU/L (ref 0–40)
Albumin: 4.2 g/dL (ref 3.5–4.8)
BILIRUBIN TOTAL: 1.1 mg/dL (ref 0.0–1.2)
BUN / CREAT RATIO: 16 (ref 10–24)
BUN: 14 mg/dL (ref 8–27)
CHLORIDE: 104 mmol/L (ref 96–106)
CO2: 25 mmol/L (ref 20–29)
Calcium: 9.8 mg/dL (ref 8.6–10.2)
Creatinine, Ser: 0.88 mg/dL (ref 0.76–1.27)
GFR calc Af Amer: 94 mL/min/{1.73_m2} (ref >59)
GFR calc non Af Amer: 82 mL/min/{1.73_m2} (ref >59)
GLUCOSE: 99 mg/dL (ref 65–99)
Globulin, Total: 2.9 g/dL (ref 1.5–4.5)
POTASSIUM: 4.5 mmol/L (ref 3.5–5.2)
Sodium: 145 mmol/L — ABNORMAL HIGH (ref 134–144)
Total Protein: 7.1 g/dL (ref 6.0–8.5)

## 2017-12-29 LAB — AMYLASE: AMYLASE: 43 U/L (ref 31–124)

## 2018-01-02 ENCOUNTER — Ambulatory Visit
Admission: RE | Admit: 2018-01-02 | Discharge: 2018-01-02 | Disposition: A | Discharge status: Home / Self Care | Payer: Medicare Other | Source: Ambulatory Visit | Attending: Family Medicine | Admitting: Family Medicine

## 2018-01-02 DIAGNOSIS — N281 Cyst of kidney, acquired: Secondary | ICD-10-CM | POA: Diagnosis not present

## 2018-01-02 DIAGNOSIS — I700 Atherosclerosis of aorta: Secondary | ICD-10-CM | POA: Insufficient documentation

## 2018-01-02 DIAGNOSIS — R109 Unspecified abdominal pain: Secondary | ICD-10-CM

## 2018-01-02 DIAGNOSIS — K7689 Other specified diseases of liver: Secondary | ICD-10-CM | POA: Diagnosis not present

## 2018-01-02 DIAGNOSIS — Z9049 Acquired absence of other specified parts of digestive tract: Secondary | ICD-10-CM | POA: Insufficient documentation

## 2018-01-06 ENCOUNTER — Telehealth: Payer: Self-pay

## 2018-01-06 DIAGNOSIS — R109 Unspecified abdominal pain: Secondary | ICD-10-CM

## 2018-01-06 NOTE — Telephone Encounter (Signed)
Patient has been advised.  Do we need to order the UGI? Thanks   ----- Message from Birdie Sons, MD sent at 01/05/2018  8:02 AM EDT ----- Ultrasound is normal. Need to proceed with upper GI with small bowel follow through to see if there are any ulcers or anything blocking his digestive tract.

## 2018-01-06 NOTE — Telephone Encounter (Signed)
-----   Message from Birdie Sons, MD sent at 01/05/2018  8:02 AM EDT ----- Ultrasound is normal. Need to proceed with upper GI with small bowel follow through to see if there are any ulcers or anything blocking his digestive tract.

## 2018-01-17 ENCOUNTER — Other Ambulatory Visit: Payer: Medicare Other

## 2018-01-23 ENCOUNTER — Ambulatory Visit: Payer: Medicare Other

## 2018-04-04 ENCOUNTER — Telehealth: Payer: Self-pay

## 2018-04-04 NOTE — Telephone Encounter (Signed)
LM for pt to CB and schedule AWV prior to CPE apt on 05/01/18. -MM

## 2018-04-15 IMAGING — CR DG RIBS 2V*L*
1 series · 4 of 4 positions shown · non-contrast
Comparison: Radiographs November 16, 2016.

CLINICAL DATA: Left rib pain without known injury.

EXAM:
LEFT RIBS - 2 VIEW

[Series 1: dg ribs unilateral left · 0.14mm/px · 4 of 4 slices shown]
[im 1/4]
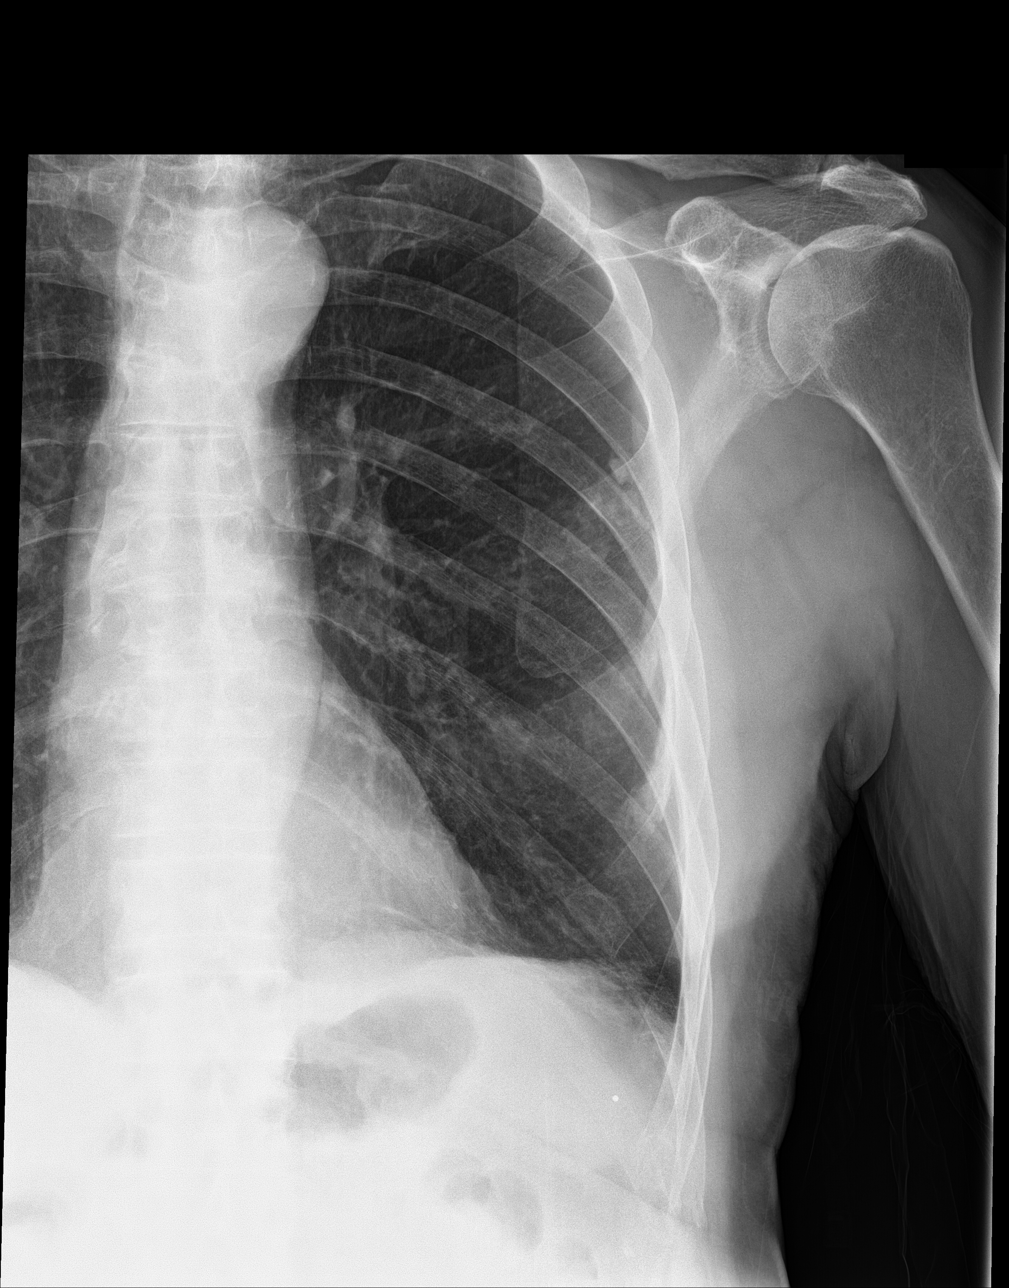
[im 2/4]
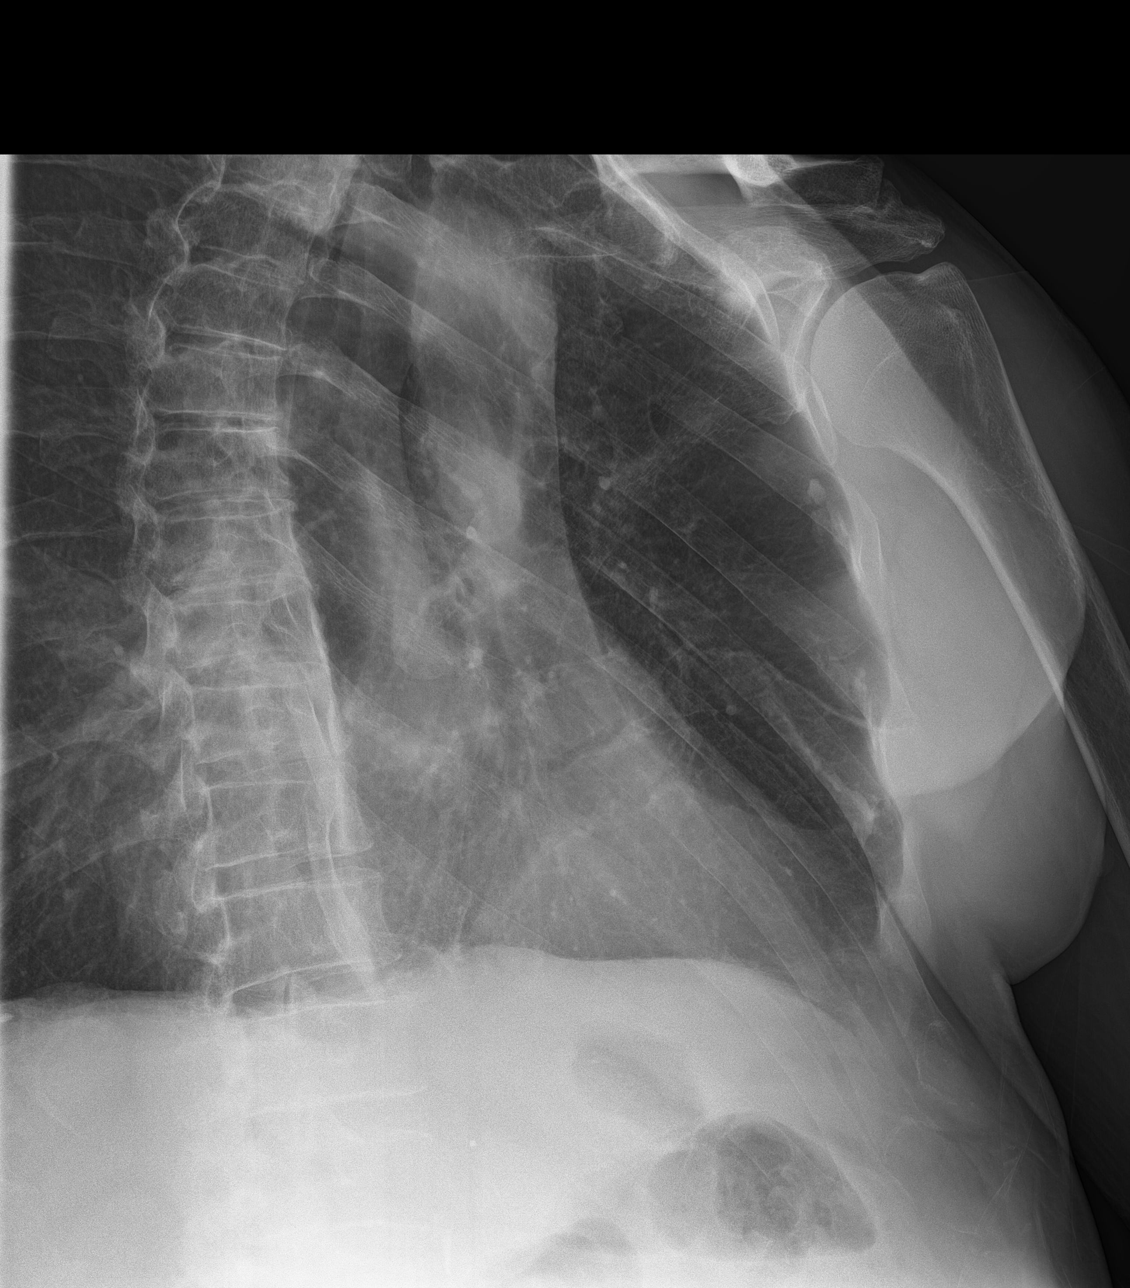
[im 3/4]
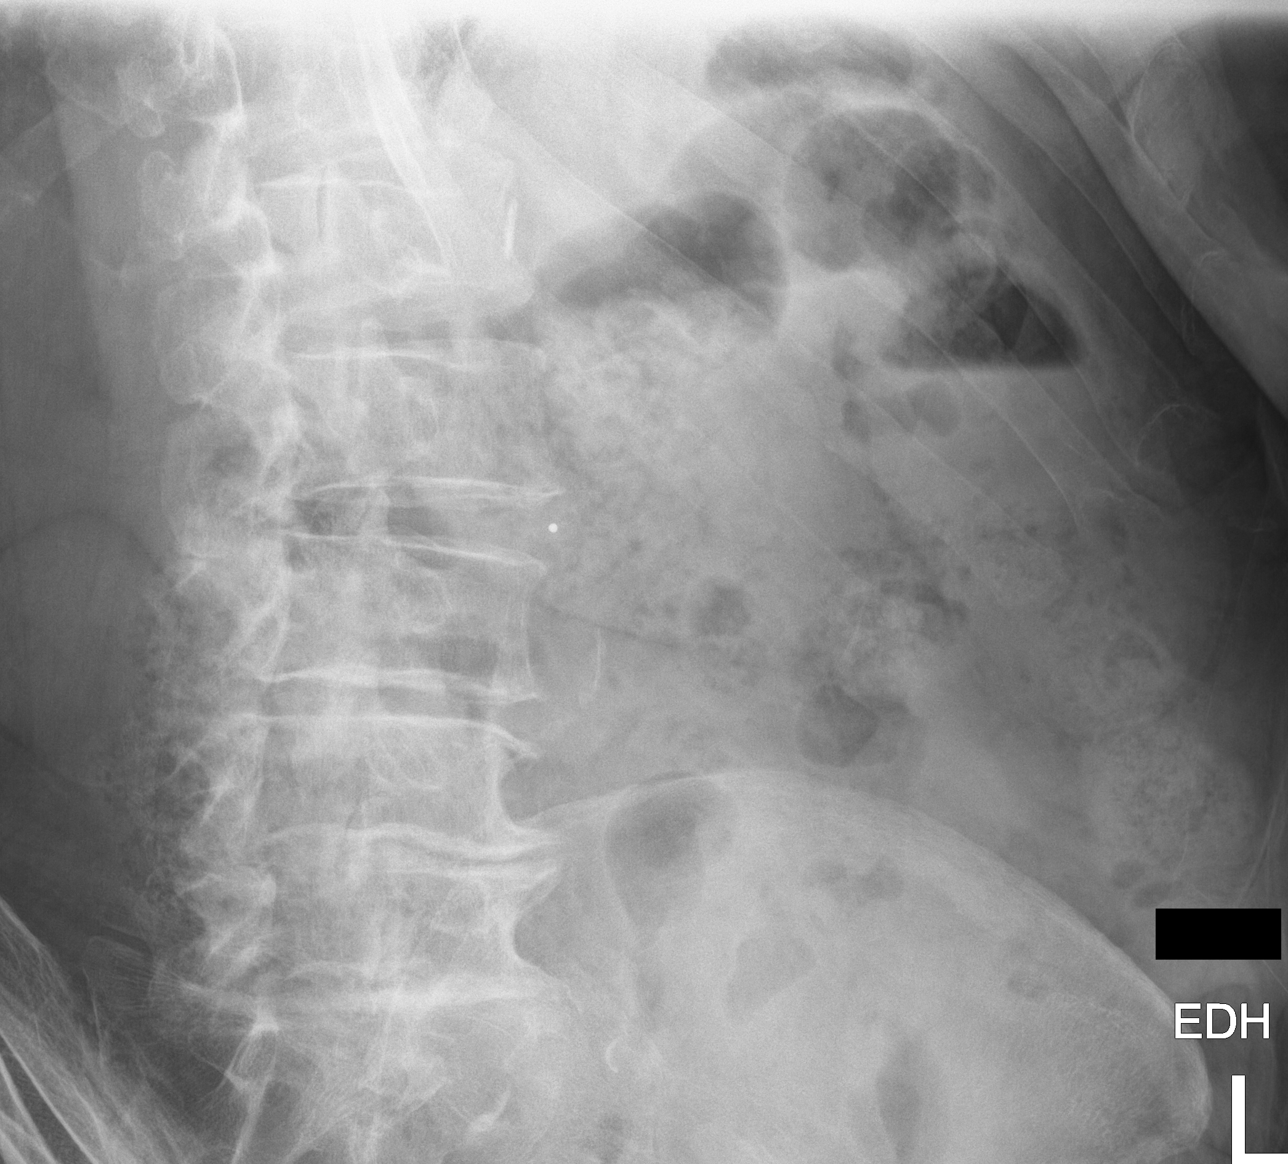
[im 4/4]
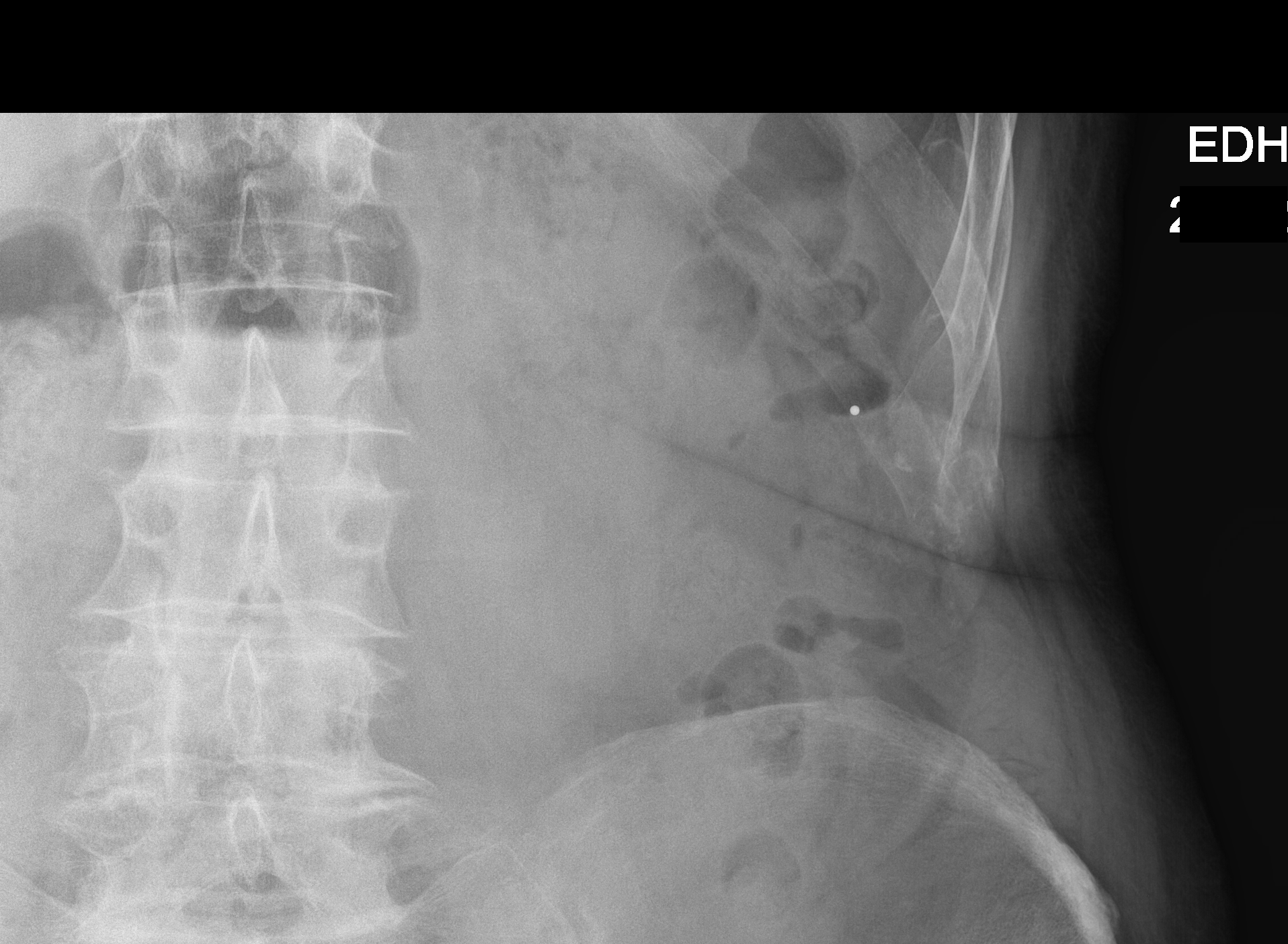

[4 of 4 positions shown; findings below may reference images not displayed]

FINDINGS: No fracture or other bone lesions are seen involving the ribs.
IMPRESSION: Normal left ribs.

## 2018-04-15 IMAGING — CR DG OS CALCIS 2+V*R*
1 series · 2 of 2 positions shown · non-contrast
Comparison: None.

CLINICAL DATA: Heel pain for 1 month.

EXAM:
RIGHT OS CALCIS - 2+ VIEW

[Series 1: dg os calcis right · 0.14mm/px · 2 of 2 slices shown]
[im 1/2]
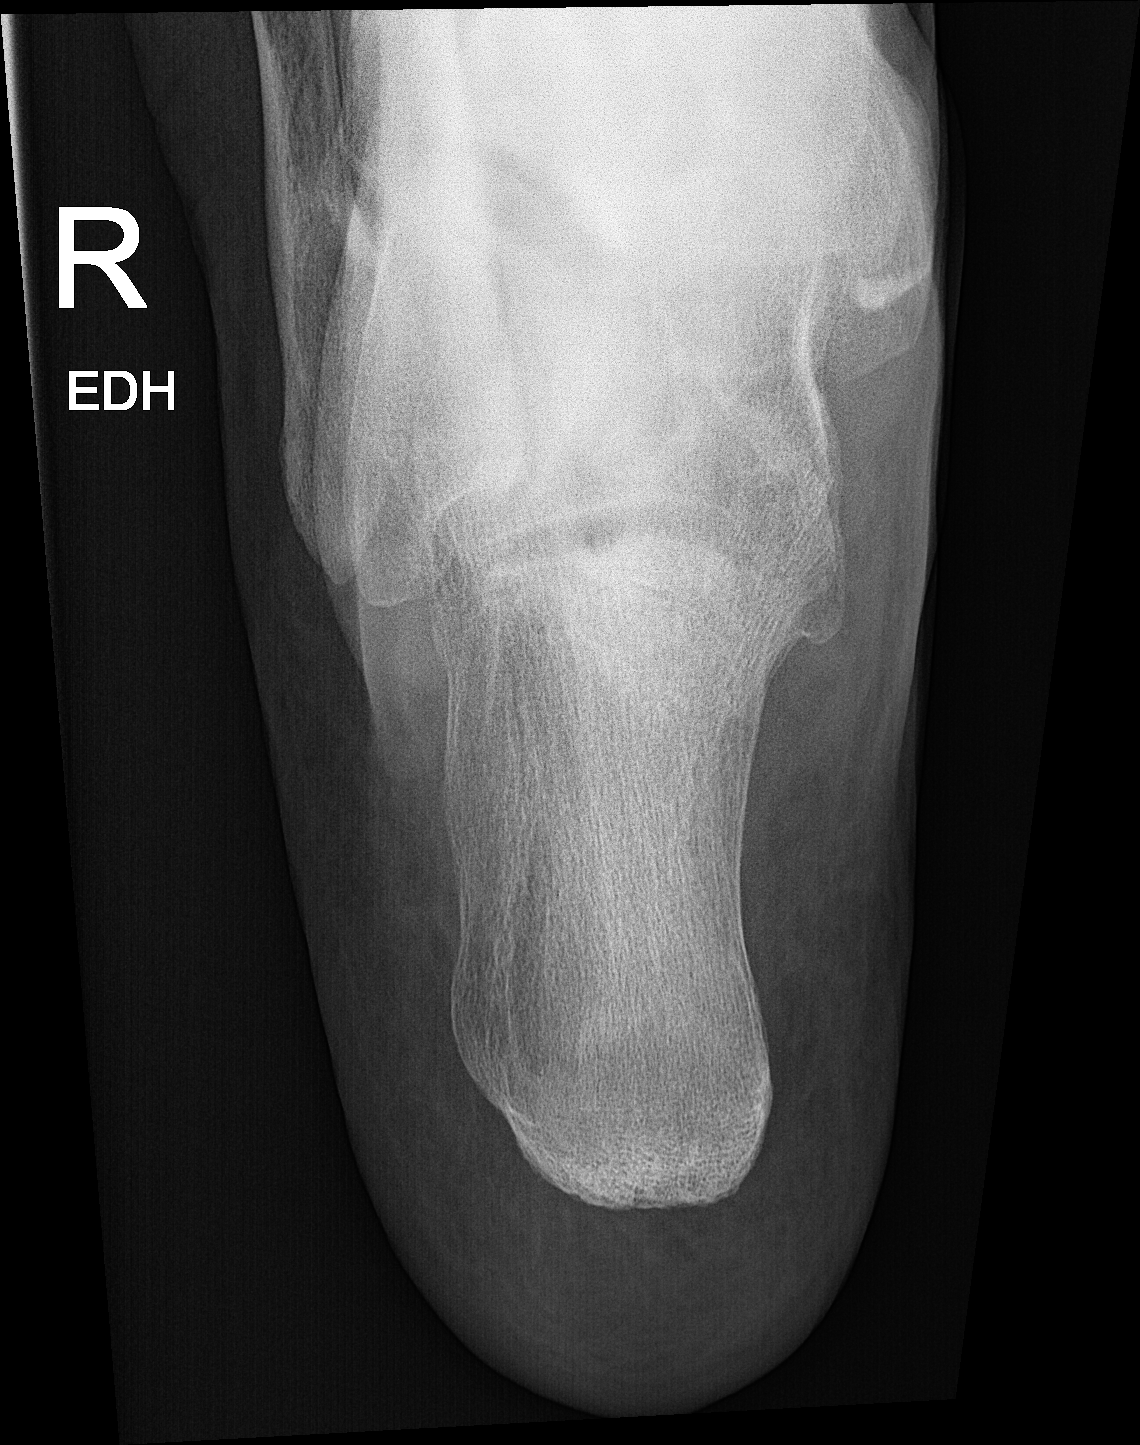
[im 2/2]
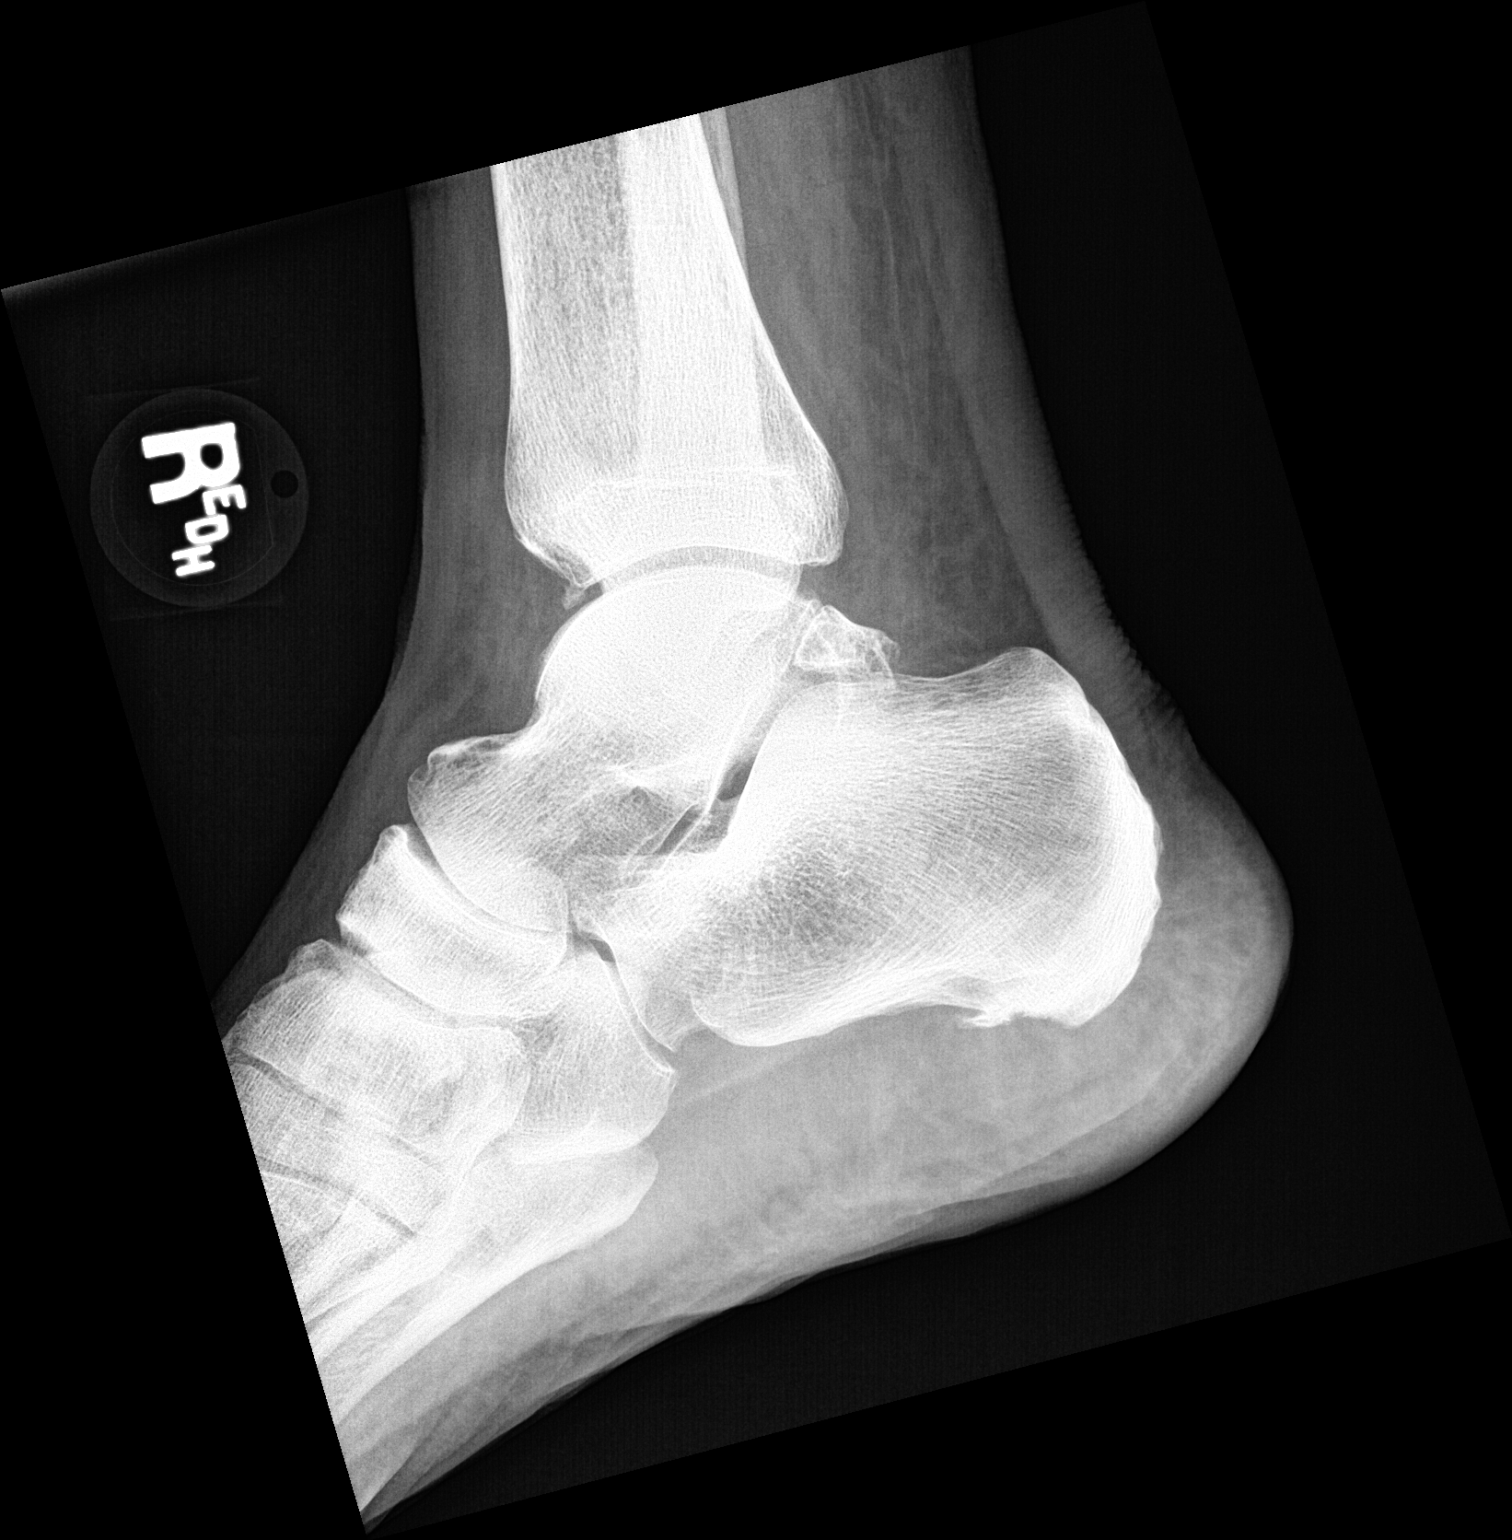

[2 of 2 positions shown; findings below may reference images not displayed]

FINDINGS: There is a small calcaneal enthesophyte the small erosion at the
plantar aspect of calcaneus. This can be seen with plantar
fasciitis. No other significant abnormality of the calcaneus. Slight
spurring at the ankle joint. Slight dorsal spurring at the
talonavicular joint.
IMPRESSION: Indirect sign suggestive of plantar fasciitis.a

## 2018-04-16 IMAGING — CR DG ABDOMEN 2V
1 series · 2 of 2 positions shown · non-contrast
Comparison: None.

CLINICAL DATA: Left side abdominal pain. No known injury. The
patient reports no bowel movement for 1 week. Nausea and dizziness.

EXAM:
ABDOMEN - 2 VIEW

[Series 1: dg abd 2 views · 0.14mm/px · 2 of 2 slices shown]
[im 1/2]
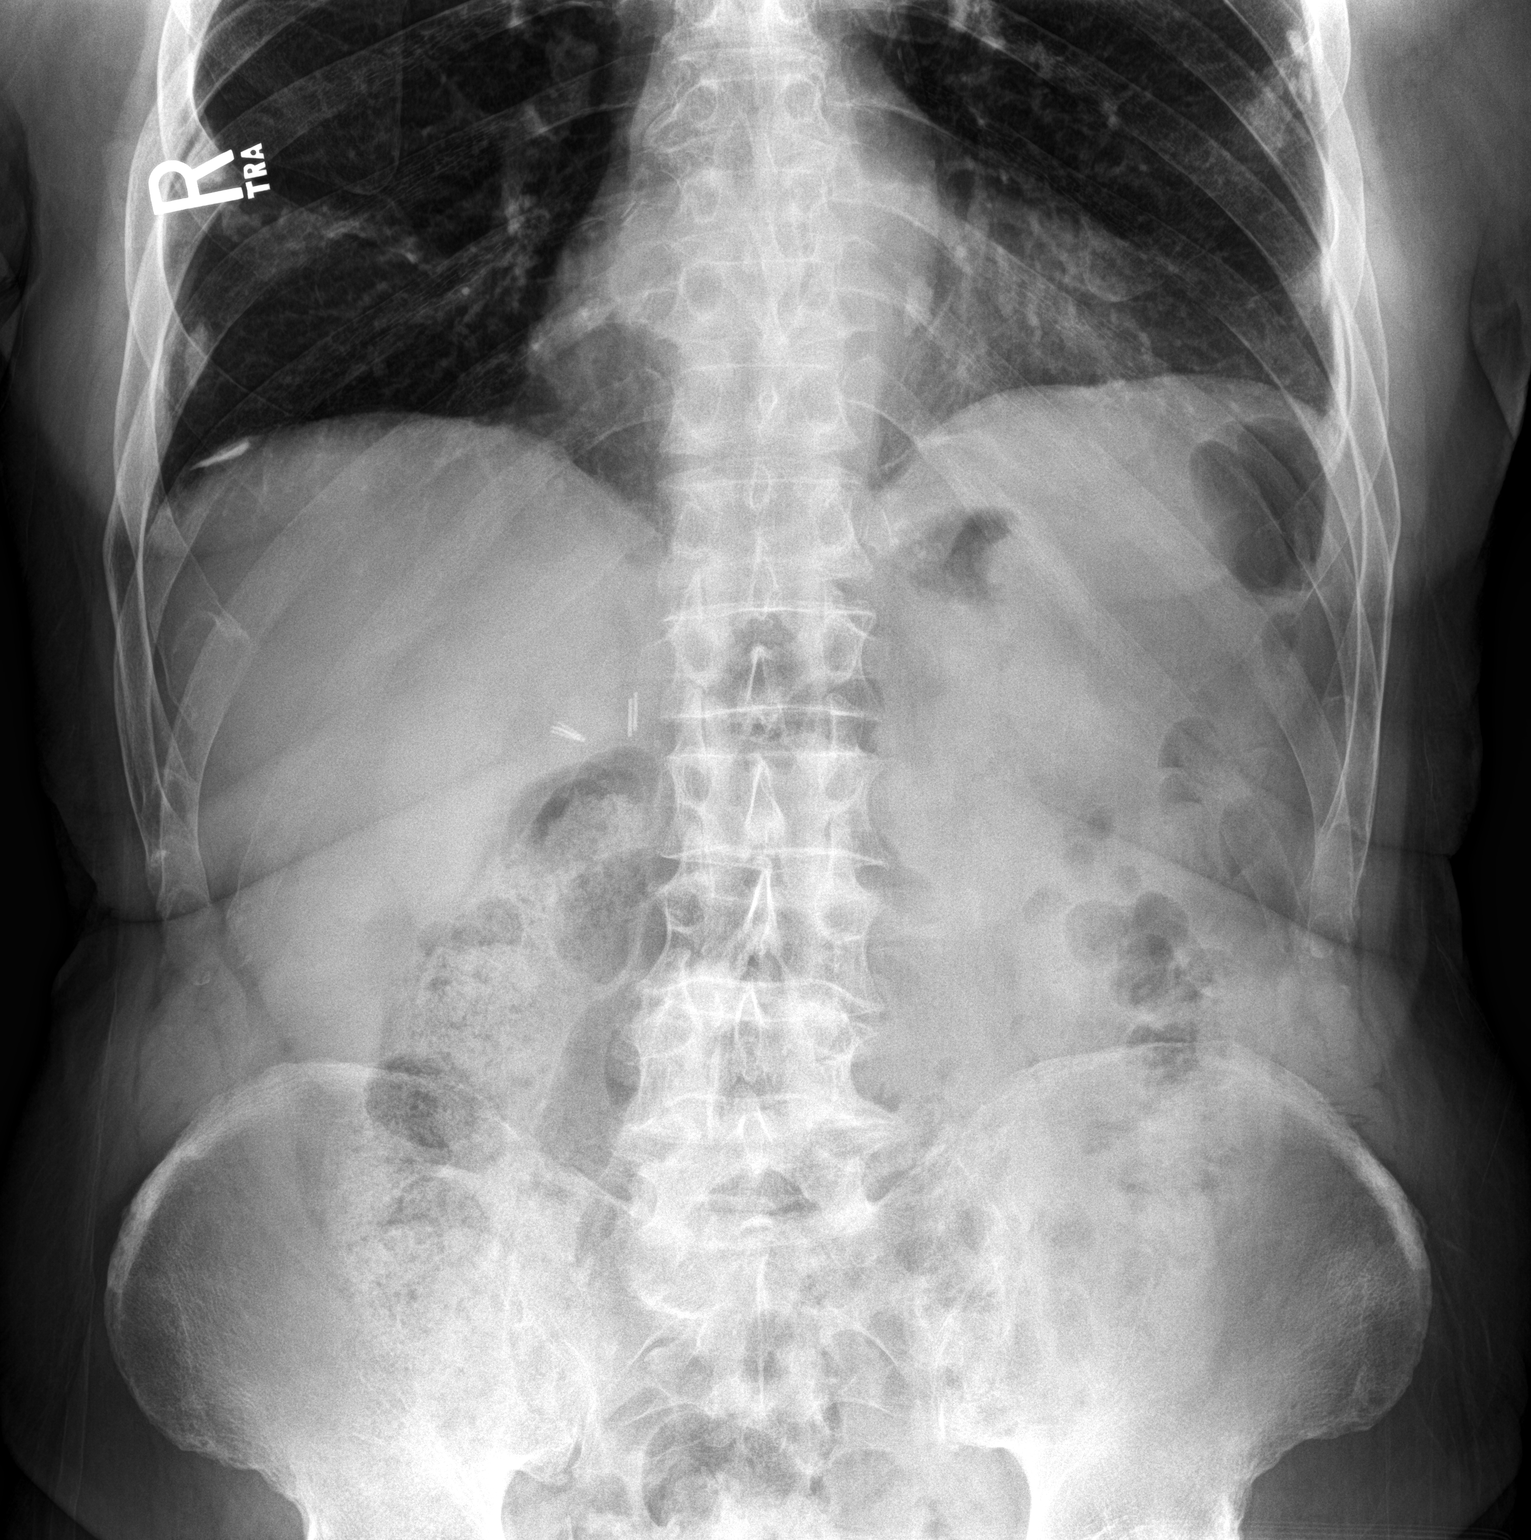
[im 2/2]
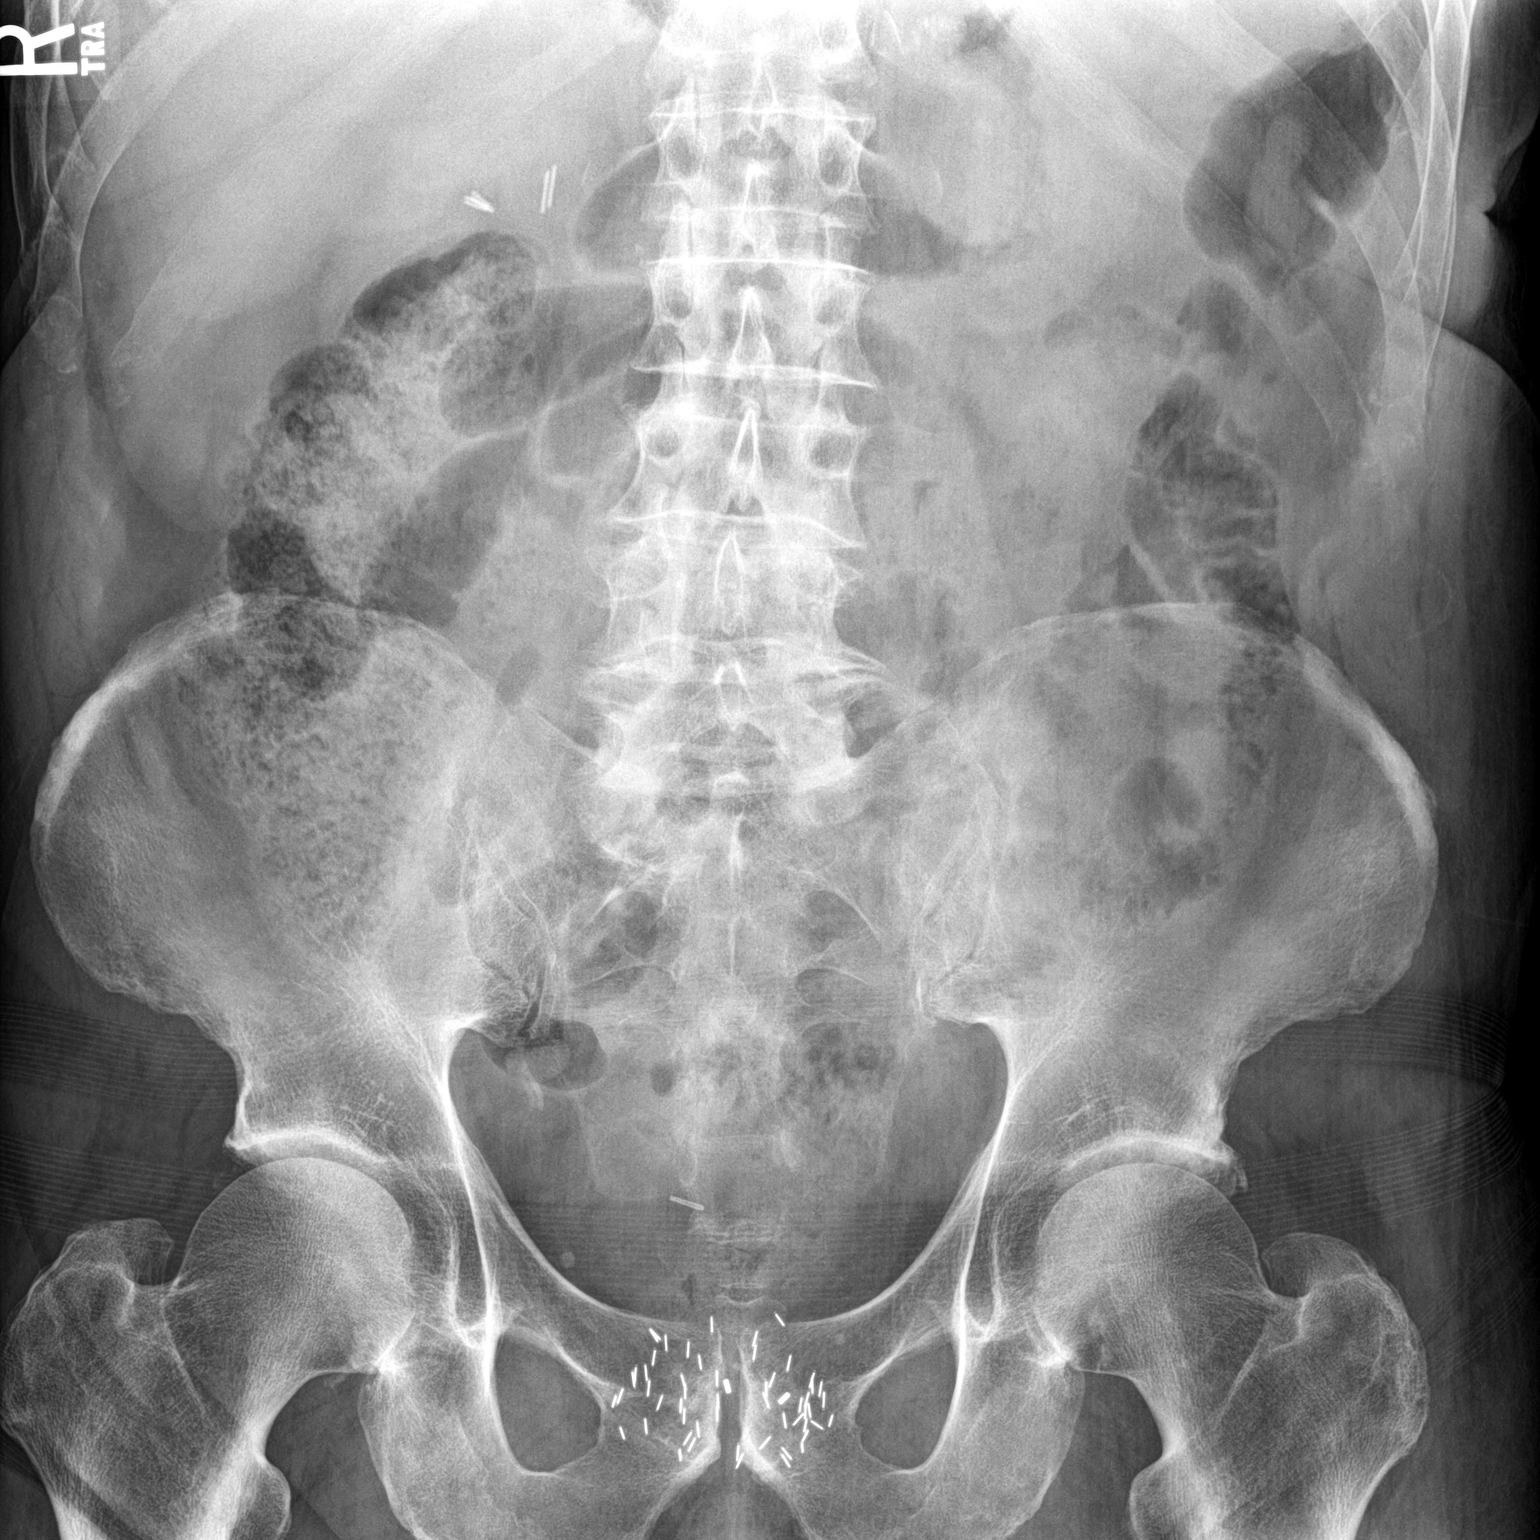

[2 of 2 positions shown; findings below may reference images not displayed]

FINDINGS: The bowel gas pattern is nonobstructive. No free intraperitoneal air
is identified. Moderate stool burden ascending colon is noted.
Cholecystectomy clips and brachytherapy seeds in the prostate gland
are identified. Calcified pleural plaque over the right
hemidiaphragm is seen. No focal bony abnormality.
IMPRESSION: No acute finding.

Moderate stool burden ascending colon.

## 2018-05-01 ENCOUNTER — Other Ambulatory Visit: Payer: Self-pay

## 2018-05-01 ENCOUNTER — Ambulatory Visit (INDEPENDENT_AMBULATORY_CARE_PROVIDER_SITE_OTHER): Payer: Medicare Other | Admitting: Family Medicine

## 2018-05-01 ENCOUNTER — Encounter: Payer: Self-pay | Admitting: Family Medicine

## 2018-05-01 VITALS — BP 130/70 | HR 54 | Temp 97.9°F | Resp 16 | Ht 70.0 in | Wt 179.0 lb

## 2018-05-01 DIAGNOSIS — Z0000 Encounter for general adult medical examination without abnormal findings: Secondary | ICD-10-CM

## 2018-05-01 DIAGNOSIS — Z136 Encounter for screening for cardiovascular disorders: Secondary | ICD-10-CM | POA: Diagnosis not present

## 2018-05-01 DIAGNOSIS — C61 Malignant neoplasm of prostate: Secondary | ICD-10-CM

## 2018-05-01 NOTE — Patient Instructions (Addendum)
   The CDC recommends two doses of Shingrix (the shingles vaccine) separated by 2 to 6 months for adults age 79 years and older. I recommend checking with your insurance plan regarding coverage for this vaccine.    You are due for a Tdap (tetanus-diptheria-pertussis vaccine) which protects you from tetanus and whooping cough. Please check with your insurance plan or pharmacy regarding coverage for this vaccine.    Please contact your eyecare professional to schedule a routine eye exam

## 2018-05-01 NOTE — Progress Notes (Signed)
Patient: Jared Mullen, Male    DOB: [birth year], 77 y.o.   MRN: [id number] Visit Date: 05/01/2018  Today's Provider: Lelon Huh, MD   Chief Complaint  Patient presents with  . Medicare Wellness  . Annual Exam   Subjective:    Annual wellness visit Jared Mullen is a 77 y.o. male. He feels well. He reports exercising yes/gym. He reports he is sleeping fairly well.  He has no complaints today. He walks five days a week with his wife and has no significant mobility problems.   -----------------------------------------------------------  Prostate cancer ([place]) From 04/29/2017-He anticipates follow up with Dr. Beatrix Fetters and declined PSA. Today he reports that he is no longer having routine follow ups with his urologist.   Review of Systems  Constitutional: Negative for chills, diaphoresis and fever.  HENT: Negative for congestion, ear discharge, ear pain, hearing loss, nosebleeds, sore throat and tinnitus.   Eyes: Negative for photophobia, pain, discharge and redness.  Respiratory: Negative for cough, shortness of breath, wheezing and stridor.   Cardiovascular: Negative for chest pain, palpitations and leg swelling.  Gastrointestinal: Negative for abdominal pain, blood in stool, constipation, diarrhea, nausea and vomiting.  Endocrine: Negative for polydipsia.  Genitourinary: Negative for dysuria, flank pain, frequency, hematuria and urgency.  Musculoskeletal: Negative for back pain, myalgias and neck pain.  Skin: Negative for rash.  Allergic/Immunologic: Negative for environmental allergies.  Neurological: Negative for dizziness, tremors, seizures, weakness and headaches.  Hematological: Does not bruise/bleed easily.  Psychiatric/Behavioral: Negative for hallucinations and suicidal ideas. The patient is not nervous/anxious.   All other systems reviewed and are negative.   Social History   Socioeconomic History  . Marital status: Married    Spouse name: Not on file  .  Number of children: Not on file  . Years of education: Not on file  . Highest education level: Not on file  Occupational History  . Not on file  Social Needs  . Financial resource strain: Not on file  . Food insecurity:    Worry: Not on file    Inability: Not on file  . Transportation needs:    Medical: Not on file    Non-medical: Not on file  Tobacco Use  . Smoking status: Former Smoker    Packs/day: 2.00    Years: 28.00    Pack years: 56.00    Types: Cigarettes    Last attempt to quit: 10/18/1980    Years since quitting: 37.5  . Smokeless tobacco: Never Used  Substance and Sexual Activity  . Alcohol use: No  . Drug use: No  . Sexual activity: Not on file    Comment: Low libido  Lifestyle  . Physical activity:    Days per week: Not on file    Minutes per session: Not on file  . Stress: Not on file  Relationships  . Social connections:    Talks on phone: Not on file    Gets together: Not on file    Attends religious service: Not on file    Active member of club or organization: Not on file    Attends meetings of clubs or organizations: Not on file    Relationship status: Not on file  . Intimate partner violence:    Fear of current or ex partner: Not on file    Emotionally abused: Not on file    Physically abused: Not on file    Forced sexual activity: Not on file  Other Topics  Concern  . Not on file  Social History Narrative  . Not on file    Past Medical History:  Diagnosis Date  . Cataract immature right eye   . History of radiation therapy 12/21/11 to 01/24/12   prostate  . Nocturia   . Peyronie's disease   . 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 Sleep apnea   . Urinary frequency   . Weak urinary stream      Patient Active Problem List   Diagnosis Date Noted  . 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  . 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 H/O adenomatous polyp of colon 11/18/2011  . Prostate cancer ([place]) 11/04/2011    Past  Surgical History:  Procedure Laterality Date  . biospy  09/30/2011-   TRUS/Bx Prostate - 5/12/biopsies positive for cancer, glandular size 32.5 cc  . CHOLECYSTECTOMY  1992  . RADIOACTIVE SEED IMPLANT  02/09/2012   Procedure: RADIOACTIVE SEED IMPLANT;  Surgeon: Franchot Gallo, MD;  Location[facility];  Service: Urology;  Laterality: N/A;  RAD TECH OK PER ANNE AT MAIN OR   . UVULOPALATOPHARYNGOPLASTY      His family history includes Breast cancer in his sister; Cancer in his brother, sister, and sister; Heart disease in his father; Kidney disease in his brother; Lung cancer in his brother and sister.     No current outpatient medications on file.  Patient Care Team: Birdie Sons, MD as PCP - General (Family Medicine) Franchot Gallo, MD as Consulting Physician (Urology)     Objective:   Vitals: BP 130/70 (BP Location: Right Arm, Patient Position: Sitting, Cuff Size: Large)   Pulse (!) 54   Temp 97.9 F (36.6 C) (Oral)   Resp 16   Ht 5\' 10"  (1.778 m)   Wt 179 lb (81.2 kg)   SpO2 99%   BMI 25.68 kg/m   Physical Exam  General Appearance:    Alert, cooperative, no distress, appears stated age  Head:    Normocephalic, without obvious abnormality, atraumatic  Eyes:    PERRL, conjunctiva/corneas clear, EOM's intact, fundi    benign, both eyes       Ears:    Normal TM's and external ear canals, both ears  Nose:   Nares normal, septum midline, mucosa normal, no drainage   or sinus tenderness  Throat:   Lips, mucosa, and tongue normal; teeth and gums normal  Neck:   Supple, symmetrical, trachea midline, no adenopathy;       thyroid:  No enlargement/tenderness/nodules; no carotid   bruit or JVD  Back:     Symmetric, no curvature, ROM normal, no CVA tenderness  Lungs:     Clear to auscultation bilaterally, respirations unlabored  Chest wall:    No tenderness or deformity  Heart:    Regular rate and rhythm, S1 and S2 normal, no murmur, rub   or gallop    Abdomen:     Soft, non-tender, bowel sounds active all four quadrants,    no masses, no organomegaly  Genitalia:    deferred  Rectal:    deferred  Extremities:   Extremities normal, atraumatic, no cyanosis or edema  Pulses:   2+ and symmetric all extremities  Skin:   Skin color, texture, turgor normal, no rashes or lesions  Lymph nodes:   Cervical, supraclavicular, and axillary nodes normal  Neurologic:   CNII-XII intact. Normal strength, sensation and reflexes      throughout  Activities of Daily Living In your present state of health, do you have any difficulty performing the following activities: 05/01/2018  Hearing? N  Vision? N  Difficulty concentrating or making decisions? N  Walking or climbing stairs? N  Dressing or bathing? N  Doing errands, shopping? N  Some recent data might be hidden    Fall Risk Assessment Fall Risk  05/01/2018 04/22/2017 04/28/2016 04/28/2015 04/28/2015  Falls in the past year? No No No No No     Depression Screen PHQ 2/9 Scores 05/01/2018 04/22/2017 04/22/2017 04/28/2016  PHQ - 2 Score 0 0 0 0  PHQ- 9 Score 0 0 - -    Cognitive Testing - 6-CIT  Correct? Score   What year is it? yes 0 0 or 4  What month is it? yes 0 0 or 3  Memorize:    Pia Mau,  40,  [address]  [place],      What time is it? (within 1 hour) yes 0 0 or 3  Count backwards from 20 yes 0 0, 2, or 4  Name the months of the year yes 0 0, 2, or 4  Repeat name & address above yes 0 0, 2, 4, 6, 8, or 10       TOTAL SCORE  0/28   Interpretation:  Normal  Normal (0-7) Abnormal (8-28)    Audit-C Alcohol Use Screening   Alcohol Use Disorder Test (AUDIT) 05/01/2018  1. How often do you have a drink containing alcohol? 0  2. How many drinks containing alcohol do you have on a typical day when you are drinking? 0  3. How often do you have six or more drinks on one occasion? 0  AUDIT-C Score 0    A score of 3 or more in women, and 4 or more in men indicates increased risk for  alcohol abuse, EXCEPT if all of the points are from question 1    Assessment & Plan:     Annual Wellness Visit  Reviewed patient's Family Medical History Reviewed and updated list of patient's medical providers Assessment of cognitive impairment was done Assessed patient's functional ability Established a written schedule for health screening [place] Completed and Reviewed  Exercise Activities and Dietary recommendations Goals    None      Immunization History  Administered Date(s) Administered  . Influenza, High Dose Seasonal PF 09/29/2016  . Pneumococcal Conjugate-13 02/27/2014  . Pneumococcal Polysaccharide-23 08/23/2011    Health Maintenance  Topic Date Due  . INFLUENZA VACCINE  06/26/2018 (Originally 05/18/2018)  . TETANUS/TDAP  10/18/2026 (Originally 12/17/1959)  . COLONOSCOPY  01/10/2022  . PNA vac Low Risk Adult  Completed     Discussed health benefits of physical activity, and encouraged him to engage in regular exercise appropriate for his age and condition.    --------------------------------------------------------------------------  1. Annual physical exam Normal exam. Recommended Tdap and Shingrix which he declined.   2. Medicare annual wellness visit, subsequent Completed.   3. Encounter for special screening examination for cardiovascular disorder  - Lipid panel  4. Prostate cancer ([place]) No longer being followed by Dr. Beatrix Fetters.  - Comprehensive metabolic panel - PSA - CBC   Lelon Huh, MD  [facility]

## 2018-05-02 LAB — COMPREHENSIVE METABOLIC PANEL
A/G RATIO: 1.8 (ref 1.2–2.2)
ALT: 13 IU/L (ref 0–44)
AST: 15 IU/L (ref 0–40)
Albumin: 4.2 g/dL (ref 3.5–4.8)
Alkaline Phosphatase: 57 IU/L (ref 39–117)
BUN/Creatinine Ratio: 14 (ref 10–24)
BUN: 11 mg/dL (ref 8–27)
Bilirubin Total: 1 mg/dL (ref 0.0–1.2)
CALCIUM: 9.5 mg/dL (ref 8.6–10.2)
CO2: 27 mmol/L (ref 20–29)
Chloride: 101 mmol/L (ref 96–106)
Creatinine, Ser: 0.76 mg/dL (ref 0.76–1.27)
GFR calc Af Amer: 100 mL/min/{1.73_m2} (ref >59)
GFR, EST NON AFRICAN AMERICAN: 87 mL/min/{1.73_m2} (ref >59)
GLUCOSE: 89 mg/dL (ref 65–99)
Globulin, Total: 2.4 g/dL (ref 1.5–4.5)
POTASSIUM: 4.5 mmol/L (ref 3.5–5.2)
Sodium: 142 mmol/L (ref 134–144)
TOTAL PROTEIN: 6.6 g/dL (ref 6.0–8.5)

## 2018-05-02 LAB — CBC
HEMOGLOBIN: 14.1 g/dL (ref 13.0–17.7)
Hematocrit: 42.9 % (ref 37.5–51.0)
MCH: 30.1 pg (ref 26.6–33.0)
MCHC: 32.9 g/dL (ref 31.5–35.7)
MCV: 92 fL (ref 79–97)
Platelets: 198 10*3/uL (ref 150–450)
RBC: 4.69 x10E6/uL (ref 4.14–5.80)
RDW: 15 % (ref 12.3–15.4)
WBC: 5.6 10*3/uL (ref 3.4–10.8)

## 2018-05-02 LAB — LIPID PANEL
CHOL/HDL RATIO: 3.1 ratio (ref 0.0–5.0)
Cholesterol, Total: 184 mg/dL (ref 100–199)
HDL: 59 mg/dL (ref >39)
LDL Calculated: 112 mg/dL — ABNORMAL HIGH (ref 0–99)
TRIGLYCERIDES: 63 mg/dL (ref 0–149)
VLDL Cholesterol Cal: 13 mg/dL (ref 5–40)

## 2018-05-02 LAB — PSA

## 2018-05-09 NOTE — Telephone Encounter (Signed)
[place] completed on 05/01/18 by PCP.

## 2019-05-03 ENCOUNTER — Ambulatory Visit: Payer: Medicare Other

## 2019-05-03 ENCOUNTER — Encounter: Payer: Medicare Other | Admitting: Family Medicine

## 2019-05-14 ENCOUNTER — Ambulatory Visit (INDEPENDENT_AMBULATORY_CARE_PROVIDER_SITE_OTHER): Payer: Medicare Other

## 2019-05-14 ENCOUNTER — Other Ambulatory Visit: Payer: Self-pay

## 2019-05-14 DIAGNOSIS — Z0000 Encounter for general adult medical examination without abnormal findings: Secondary | ICD-10-CM | POA: Diagnosis not present

## 2019-05-14 NOTE — Patient Instructions (Addendum)
Mr. Jared Mullen , Thank you for taking time to come for your Medicare Wellness Visit. I appreciate your ongoing commitment to your health goals. Please review the following plan we discussed and let me know if I can assist you in the future.   Screening recommendations/referrals: Colonoscopy: Up to date, due 12/2021 Recommended yearly ophthalmology/optometry visit for glaucoma screening and checkup Recommended yearly dental visit for hygiene and checkup  Vaccinations: Influenza vaccine: Due fall 2020 Pneumococcal vaccine: Completed series Tdap vaccine: Pt declines today.  Shingles vaccine: Pt declines today.     Advanced directives: Please bring a copy of your POA (Power of Attorney) and/or Living Will to your next appointment.   Conditions/risks identified: Continue to increase water intake to 6-8 8 oz glasses a day.  Next appointment: 06/12/19 @ 10:00 AM with Dr Caryn Section. Declined scheduling an AWV for 2021 at this time.   Preventive Care 37 Years and Older, Male Preventive care refers to lifestyle choices and visits with your health care provider that can promote health and wellness. What does preventive care include?  A yearly physical exam. This is also called an annual well check.  Dental exams once or twice a year.  Routine eye exams. Ask your health care provider how often you should have your eyes checked.  Personal lifestyle choices, including:  Daily care of your teeth and gums.  Regular physical activity.  Eating a healthy diet.  Avoiding tobacco and drug use.  Limiting alcohol use.  Practicing safe sex.  Taking low doses of aspirin every day.  Taking vitamin and mineral supplements as recommended by your health care provider. What happens during an annual well check? The services and screenings done by your health care provider during your annual well check will depend on your age, overall health, lifestyle risk factors, and family history of disease. Counseling   Your health care provider may ask you questions about your:  Alcohol use.  Tobacco use.  Drug use.  Emotional well-being.  Home and relationship well-being.  Sexual activity.  Eating habits.  History of falls.  Memory and ability to understand (cognition).  Work and work Statistician. Screening  You may have the following tests or measurements:  Height, weight, and BMI.  Blood pressure.  Lipid and cholesterol levels. These may be checked every 5 years, or more frequently if you are over 73 years old.  Skin check.  Lung cancer screening. You may have this screening every year starting at age 77 if you have a 30-pack-year history of smoking and currently smoke or have quit within the past 15 years.  Fecal occult blood test (FOBT) of the stool. You may have this test every year starting at age 42.  Flexible sigmoidoscopy or colonoscopy. You may have a sigmoidoscopy every 5 years or a colonoscopy every 10 years starting at age 24.  Prostate cancer screening. Recommendations will vary depending on your family history and other risks.  Hepatitis C blood test.  Hepatitis B blood test.  Sexually transmitted disease (STD) testing.  Diabetes screening. This is done by checking your blood sugar (glucose) after you have not eaten for a while (fasting). You may have this done every 1-3 years.  Abdominal aortic aneurysm (AAA) screening. You may need this if you are a current or former smoker.  Osteoporosis. You may be screened starting at age 110 if you are at high risk. Talk with your health care provider about your test results, treatment options, and if necessary, the need for more  tests. Vaccines  Your health care provider may recommend certain vaccines, such as:  Influenza vaccine. This is recommended every year.  Tetanus, diphtheria, and acellular pertussis (Tdap, Td) vaccine. You may need a Td booster every 10 years.  Zoster vaccine. You may need this after age 78.   Pneumococcal 13-valent conjugate (PCV13) vaccine. One dose is recommended after age 87.  Pneumococcal polysaccharide (PPSV23) vaccine. One dose is recommended after age 25. Talk to your health care provider about which screenings and vaccines you need and how often you need them. This information is not intended to replace advice given to you by your health care provider. Make sure you discuss any questions you have with your health care provider. Document Released: 10/31/2015 Document Revised: 06/23/2016 Document Reviewed: 08/05/2015 Elsevier Interactive Patient Education  2017 [place] Prevention in the Home Falls can cause injuries. They can happen to people of all ages. There are many things you can do to make your home safe and to help prevent falls. What can I do on the outside of my home?  Regularly fix the edges of walkways and driveways and fix any cracks.  Remove anything that might make you trip as you walk through a door, such as a raised step or threshold.  Trim any bushes or trees on the path to your home.  Use bright outdoor lighting.  Clear any walking paths of anything that might make someone trip, such as rocks or tools.  Regularly check to see if handrails are loose or broken. Make sure that both sides of any steps have handrails.  Any raised decks and porches should have guardrails on the edges.  Have any leaves, snow, or ice cleared regularly.  Use sand or salt on walking paths during winter.  Clean up any spills in your garage right away. This includes oil or grease spills. What can I do in the bathroom?  Use night lights.  Install grab bars by the toilet and in the tub and shower. Do not use towel bars as grab bars.  Use non-skid mats or decals in the tub or shower.  If you need to sit down in the shower, use a plastic, non-slip stool.  Keep the floor dry. Clean up any water that spills on the floor as soon as it happens.  Remove soap  buildup in the tub or shower regularly.  Attach bath mats securely with double-sided non-slip rug tape.  Do not have throw rugs and other things on the floor that can make you trip. What can I do in the bedroom?  Use night lights.  Make sure that you have a light by your bed that is easy to reach.  Do not use any sheets or blankets that are too big for your bed. They should not hang down onto the floor.  Have a firm chair that has side arms. You can use this for support while you get dressed.  Do not have throw rugs and other things on the floor that can make you trip. What can I do in the kitchen?  Clean up any spills right away.  Avoid walking on wet floors.  Keep items that you use a lot in easy-to-reach places.  If you need to reach something above you, use a strong step stool that has a grab bar.  Keep electrical cords out of the way.  Do not use floor polish or wax that makes floors slippery. If you must use wax, use non-skid floor  wax.  Do not have throw rugs and other things on the floor that can make you trip. What can I do with my stairs?  Do not leave any items on the stairs.  Make sure that there are handrails on both sides of the stairs and use them. Fix handrails that are broken or loose. Make sure that handrails are as long as the stairways.  Check any carpeting to make sure that it is firmly attached to the stairs. Fix any carpet that is loose or worn.  Avoid having throw rugs at the top or bottom of the stairs. If you do have throw rugs, attach them to the floor with carpet tape.  Make sure that you have a light switch at the top of the stairs and the bottom of the stairs. If you do not have them, ask someone to add them for you. What else can I do to help prevent falls?  Wear shoes that:  Do not have high heels.  Have rubber bottoms.  Are comfortable and fit you well.  Are closed at the toe. Do not wear sandals.  If you use a stepladder:  Make  sure that it is fully opened. Do not climb a closed stepladder.  Make sure that both sides of the stepladder are locked into place.  Ask someone to hold it for you, if possible.  Clearly mark and make sure that you can see:  Any grab bars or handrails.  First and last steps.  Where the edge of each step is.  Use tools that help you move around (mobility aids) if they are needed. These include:  Canes.  Walkers.  Scooters.  Crutches.  Turn on the lights when you go into a dark area. Replace any light bulbs as soon as they burn out.  Set up your furniture so you have a clear path. Avoid moving your furniture around.  If any of your floors are uneven, fix them.  If there are any pets around you, be aware of where they are.  Review your medicines with your doctor. Some medicines can make you feel dizzy. This can increase your chance of falling. Ask your doctor what other things that you can do to help prevent falls. This information is not intended to replace advice given to you by your health care provider. Make sure you discuss any questions you have with your health care provider. Document Released: 07/31/2009 Document Revised: 03/11/2016 Document Reviewed: 11/08/2014 Elsevier Interactive Patient Education  2017 [employer].

## 2019-05-14 NOTE — Progress Notes (Signed)
Subjective:   Jared Mullen is a 78 y.o. male who presents for Medicare Annual/Subsequent preventive examination.    This visit is being conducted through telemedicine due to the COVID-19 pandemic. This patient has given me verbal consent via doximity to conduct this visit, patient states they are participating from their home address. Some vital signs may be absent or patient reported.    Patient identification: identified by name, DOB, and current address  Review of Systems:  N/A  Cardiac Risk Factors include: advanced age (>44men, >28 women);male gender     Objective:    Vitals: There were no vitals taken for this visit.  There is no height or weight on file to calculate BMI. Unable to obtain vitals due to visit being conducted via telephonically.   Advanced Directives 05/14/2019 04/22/2017 11/16/2016 04/28/2016 02/09/2012 02/08/2012  Does Patient Have a Medical Advance Directive? Yes No No No Patient does not have advance directive;Patient has advance directive, copy in chart Patient would like information  Type of Advance Directive Healthcare Power of [place];Living will - - - - -  Copy of [place] in Chart? No - copy requested - - - - -  Would patient like information on creating a medical advance directive? - No - Patient declined - - - -  Pre-existing out of facility DNR order (yellow form or pink MOST form) - - - - No -    Tobacco Social History   Tobacco Use  Smoking Status Former Smoker  . Packs/day: 2.00  . Years: 28.00  . Pack years: 56.00  . Types: Cigarettes  . Quit date: 10/18/1980  . Years since quitting: 38.5  Smokeless Tobacco Never Used     Counseling given: Not Answered   Clinical Intake:  Pre-visit preparation completed: Yes  Pain : No/denies pain Pain Score: 0-No pain     Nutritional Risks: None Diabetes: No  How often do you need to have someone help you when you read instructions, pamphlets, or other written materials from  your doctor or pharmacy?: 1 - Never  Interpreter Needed?: No  Information entered by :: [facility], LPN  Past Medical History:  Diagnosis Date  . Cataract immature right eye   . History of radiation therapy 12/21/11 to 01/24/12   prostate  . Nocturia   . Peyronie's disease   . 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 Sleep apnea   . Urinary frequency   . Weak urinary stream    Past Surgical History:  Procedure Laterality Date  . biospy  09/30/2011-   TRUS/Bx Prostate - 5/12/biopsies positive for cancer, glandular size 32.5 cc  . CHOLECYSTECTOMY  1992  . RADIOACTIVE SEED IMPLANT  02/09/2012   Procedure: RADIOACTIVE SEED IMPLANT;  Surgeon: Franchot Gallo, MD;  [facility]: [facility];  Service: Urology;  Laterality: N/A;  RAD TECH OK PER ANNE AT MAIN OR   . UVULOPALATOPHARYNGOPLASTY     Family History  Problem Relation Age of Onset  . Heart disease Father   . Breast cancer Sister   . Cancer Sister        lung  . Lung cancer Brother   . Cancer Brother        bladder  . Lung cancer Sister   . Cancer Sister        breast  . Kidney disease Brother    Social History   Socioeconomic History  . Marital status: Married    Spouse name:  Not on file  . Number of children: 3  . Years of education: Not on file  . Highest education level: High school graduate  Occupational History  . Occupation: retired  Scientific laboratory technician  . Financial resource strain: Not hard at all  . Food insecurity    Worry: Never true    Inability: Never true  . Transportation needs    Medical: No    Non-medical: No  Tobacco Use  . Smoking status: Former Smoker    Packs/day: 2.00    Years: 28.00    Pack years: 56.00    Types: Cigarettes    Quit date: 10/18/1980    Years since quitting: 38.5  . Smokeless tobacco: Never Used  Substance and Sexual Activity  . Alcohol use: No  . Drug use: No  . Sexual activity: Not on file    Comment: Low libido  Lifestyle  . Physical activity     Days per week: 0 days    Minutes per session: 0 min  . Stress: Not at all  Relationships  . Social Herbalist on phone: Patient refused    Gets together: Patient refused    Attends religious service: Patient refused    Active member of club or organization: Patient refused    Attends meetings of clubs or organizations: Patient refused    Relationship status: Patient refused  Other Topics Concern  . Not on file  Social History Narrative  . Not on file    No outpatient encounter medications on file as of 05/14/2019.   No facility-administered encounter medications on file as of 05/14/2019.     Activities of Daily Living In your present state of health, do you have any difficulty performing the following activities: 05/14/2019  Hearing? N  Vision? Y  Comment Needs a new eye glass prescription.  Difficulty concentrating or making decisions? N  Walking or climbing stairs? N  Dressing or bathing? N  Doing errands, shopping? N  Preparing Food and eating ? N  Using the Toilet? N  In the past six months, have you accidently leaked urine? N  Do you have problems with loss of bowel control? N  Managing your Medications? N  Managing your Finances? N  Housekeeping or managing your Housekeeping? N  Some recent data might be hidden    Patient Care Team: Birdie Sons, MD as PCP - General (Family Medicine) Franchot Gallo, MD as Consulting Physician (Urology)   Assessment:   This is a routine wellness examination for Jared Mullen.  Exercise Activities and Dietary recommendations Current Exercise Habits: Home exercise routine, Type of exercise: stretching, Time (Minutes): 30, Frequency (Times/Week): 7, Weekly Exercise (Minutes/Week): 210, Intensity: Mild, Exercise limited by: None identified  Goals    . Increase water intake     Recommend increasing water intake to 4-6 glasses a day.        Fall Risk: Fall Risk  05/14/2019 05/01/2018 04/22/2017 04/28/2016 04/28/2015   Falls in the past year? 0 No No No No    FALL RISK PREVENTION PERTAINING TO THE HOME:  Any stairs in or around the home? No  If so, are there any without handrails? N/A  Home free of loose throw rugs in walkways, pet beds, electrical cords, etc? Yes  Adequate lighting in your home to reduce risk of falls? Yes   ASSISTIVE DEVICES UTILIZED TO PREVENT FALLS:  Life alert? No  Use of a cane, walker or w/c? No  Grab bars in the  bathroom? Yes  Shower chair or bench in shower? Yes  Elevated toilet seat or a handicapped toilet? No   TIMED UP AND GO:  Was the test performed? No .    Depression Screen PHQ 2/9 Scores 05/14/2019 05/01/2018 04/22/2017 04/22/2017  PHQ - 2 Score 0 0 0 0  PHQ- 9 Score - 0 0 -    Cognitive Function: Declined today.      6CIT Screen 04/22/2017  What Year? 0 points  What month? 0 points  What time? 0 points  Count back from 20 0 points  Months in reverse 0 points  Repeat phrase 0 points  Total Score 0    Immunization History  Administered Date(s) Administered  . Influenza, High Dose Seasonal PF 09/29/2016  . Pneumococcal Conjugate-13 02/27/2014  . Pneumococcal Polysaccharide-23 08/23/2011    Qualifies for Shingles Vaccine? Yes . Due for Shingrix. Education has been provided regarding the importance of this vaccine. Pt has been advised to call insurance company to determine out of pocket expense. Advised may also receive vaccine at local pharmacy or Health Dept. Verbalized acceptance and understanding.  Tdap: Although this vaccine is not a covered service during a Wellness Exam, does the patient still wish to receive this vaccine today?  No .   Flu Vaccine: Due fall 2020.   Pneumococcal Vaccine: Completed series  Screening Tests Health Maintenance  Topic Date Due  . TETANUS/TDAP  10/18/2026 (Originally 12/17/1959)  . INFLUENZA VACCINE  05/19/2019  . COLONOSCOPY  01/10/2022  . PNA vac Low Risk Adult  Completed   Cancer Screenings:  Colorectal  Screening: Completed 01/10/17. Repeat every 5 years.  Lung Cancer Screening: (Low Dose CT Chest recommended if Age 50-80 years, 30 pack-year currently smoking OR have quit w/in 15years.) does not qualify.   Additional Screening:  Vision Screening: Recommended annual ophthalmology exams for early detection of glaucoma and other disorders of the eye.  Dental Screening: Recommended annual dental exams for proper oral hygiene  Community Resource Referral:  CRR required this visit?  No        Plan:  I have personally reviewed and addressed the Medicare Annual Wellness questionnaire and have noted the following in the patient's chart:  A. Medical and social history B. Use of alcohol, tobacco or illicit drugs  C. Current medications and supplements D. Functional ability and status E.  Nutritional status F.  Physical activity G. Advance directives H. List of other physicians I.  Hospitalizations, surgeries, and ER visits in previous 12 months J.  [place] such as hearing and vision if needed, cognitive and depression L. Referrals and appointments   In addition, I have reviewed and discussed with patient certain preventive protocols, quality metrics, and best practice recommendations. A written personalized care plan for preventive services as well as general preventive health recommendations were provided to patient.   Glendora Score, [place]  3/82/5053 Nurse Health Advisor   Nurse Notes: None.

## 2019-06-12 ENCOUNTER — Encounter: Payer: Self-pay | Admitting: Family Medicine

## 2019-06-12 ENCOUNTER — Ambulatory Visit (INDEPENDENT_AMBULATORY_CARE_PROVIDER_SITE_OTHER): Payer: Medicare Other | Admitting: Family Medicine

## 2019-06-12 ENCOUNTER — Other Ambulatory Visit: Payer: Self-pay

## 2019-06-12 VITALS — BP 130/74 | HR 61 | Temp 96.9°F | Resp 16 | Ht 70.0 in | Wt 181.0 lb

## 2019-06-12 DIAGNOSIS — Z0000 Encounter for general adult medical examination without abnormal findings: Secondary | ICD-10-CM | POA: Diagnosis not present

## 2019-06-12 DIAGNOSIS — R1084 Generalized abdominal pain: Secondary | ICD-10-CM

## 2019-06-12 DIAGNOSIS — K7689 Other specified diseases of liver: Secondary | ICD-10-CM

## 2019-06-12 DIAGNOSIS — Z125 Encounter for screening for malignant neoplasm of prostate: Secondary | ICD-10-CM | POA: Diagnosis not present

## 2019-06-12 DIAGNOSIS — R1314 Dysphagia, pharyngoesophageal phase: Secondary | ICD-10-CM | POA: Diagnosis not present

## 2019-06-12 DIAGNOSIS — Z8546 Personal history of malignant neoplasm of prostate: Secondary | ICD-10-CM

## 2019-06-12 NOTE — Patient Instructions (Signed)
.   Please review the attached list of medications and notify my office if there are any errors.   . Please bring all of your medications to every appointment so we can make sure that our medication list is the same as yours.   . It is especially important to get the annual flu vaccine this year. If you haven't had it already, please go to your pharmacy or call the office as soon as possible to schedule you flu shot.  

## 2019-06-12 NOTE — Progress Notes (Addendum)
Patient: Jared Mullen, Male    DOB: 1941-08-29, 78 y.o.   MRN: [id number] Visit Date: 06/12/2019  Today's Provider: Lelon Huh, MD   Chief Complaint  Patient presents with  . Annual Exam   Subjective:     Complete Physical Jared Mullen is a 78 y.o. male. He feels fairly well. He reports exercising daily. He reports he is sleeping fairly well.  Complains of food getting stuck in lower throat several months slowly getting more frequent  Complains of occasional LLQ pain.  -----------------------------------------------------------  Follow up for Prostate Cancer:  The patient was last seen for this 1 years ago. Changes made at last visit include none. This problem is followed by Dr. Beatrix Fetters.  Was last seen in by Dr. Beatrix Fetters for surveillance in February.  He reports good compliance with treatment. He feels that condition is stable. He is not having side effects.   ------------------------------------------------------------------------------------  He does complain of persistent LLQ pain for several months. No relation to eating or BMs. Last colonoscopy in 2018 by Dr. Paulita Fujita with some polyps, but no diverticulosis mentioned.   He also states that food frequently gets stuck in esophagus, 3-4 inches below trachea. No nausea or vomiting. Denies heart burn.   Review of Systems  Constitutional: Negative for appetite change, chills, fatigue and fever.  HENT: Negative for congestion, ear pain, hearing loss, nosebleeds and trouble swallowing.   Eyes: Negative for pain and visual disturbance.  Respiratory: Negative for cough, chest tightness and shortness of breath.   Cardiovascular: Negative for chest pain, palpitations and leg swelling.  Gastrointestinal: Negative for abdominal pain, blood in stool, constipation, diarrhea, nausea and vomiting.  Endocrine: Negative for polydipsia, polyphagia and polyuria.  Genitourinary: Negative for dysuria and flank pain.   Musculoskeletal: Negative for arthralgias, back pain, joint swelling, myalgias and neck stiffness.  Skin: Negative for color change, rash and wound.  Neurological: Negative for dizziness, tremors, seizures, speech difficulty, weakness, light-headedness and headaches.  Psychiatric/Behavioral: Negative for behavioral problems, confusion, decreased concentration, dysphoric mood and sleep disturbance. The patient is not nervous/anxious.   All other systems reviewed and are negative.   Social History   Socioeconomic History  . Marital status: Married    Spouse name: Not on file  . Number of children: 3  . Years of education: Not on file  . Highest education level: High school graduate  Occupational History  . Occupation: retired  Scientific laboratory technician  . Financial resource strain: Not hard at all  . Food insecurity    Worry: Never true    Inability: Never true  . Transportation needs    Medical: No    Non-medical: No  Tobacco Use  . Smoking status: Former Smoker    Packs/day: 2.00    Years: 28.00    Pack years: 56.00    Types: Cigarettes    Quit date: 10/18/1980    Years since quitting: 38.6  . Smokeless tobacco: Never Used  Substance and Sexual Activity  . Alcohol use: No  . Drug use: No  . Sexual activity: Not on file    Comment: Low libido  Lifestyle  . Physical activity    Days per week: 0 days    Minutes per session: 0 min  . Stress: Not at all  Relationships  . Social connections    Talks on phone: Patient refused    Gets together: Patient refused    Attends religious service: Patient refused    Active member  of club or organization: Patient refused    Attends meetings of clubs or organizations: Patient refused    Relationship status: Patient refused  . Intimate partner violence    Fear of current or ex partner: Patient refused    Emotionally abused: Patient refused    Physically abused: Patient refused    Forced sexual activity: Patient refused  Other Topics Concern   . Not on file  Social History Narrative  . Not on file    Past Medical History:  Diagnosis Date  . Cataract immature right eye   . History of radiation therapy 12/21/11 to 01/24/12   prostate  . Nocturia   . Peyronie's disease   . 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 Sleep apnea   . Urinary frequency   . Weak urinary stream      Patient Active Problem List   Diagnosis Date Noted  . 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  . 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 H/O adenomatous polyp of colon 11/18/2011  . Prostate cancer ([place]) 11/04/2011    Past Surgical History:  Procedure Laterality Date  . biospy  09/30/2011-   TRUS/Bx Prostate - 5/12/biopsies positive for cancer, glandular size 32.5 cc  . CHOLECYSTECTOMY  1992  . RADIOACTIVE SEED IMPLANT  02/09/2012   Procedure: RADIOACTIVE SEED IMPLANT;  Surgeon: Franchot Gallo, MD;  Location: [facility];  Service: Urology;  Laterality: N/A;  RAD TECH OK PER ANNE AT MAIN OR   . UVULOPALATOPHARYNGOPLASTY      His family history includes Breast cancer in his sister; Cancer in his brother, sister, and sister; Heart disease in his father; Kidney disease in his brother; Lung cancer in his brother and sister.  No current outpatient medications on file.  Patient Care Team: Birdie Sons, MD as PCP - General (Family Medicine) Franchot Gallo, MD as Consulting Physician (Urology)     Objective:    Vitals: BP 130/74 (BP Location: Left Arm, Patient Position: Sitting, Cuff Size: Large)   Pulse 61   Temp (!) 96.9 F (36.1 C) (Oral)   Resp 16   Ht 5\' 10"  (1.778 m)   Wt 181 lb (82.1 kg)   SpO2 98% Comment: room air  BMI 25.97 kg/m   Physical Exam   General Appearance:    Alert, cooperative, no distress, appears stated age  Head:    Normocephalic, without obvious abnormality, atraumatic  Eyes:    PERRL, conjunctiva/corneas clear, EOM's intact, fundi    benign, both eyes       Ears:     Normal TM's and external ear canals, both ears  Nose:   Nares normal, septum midline, mucosa normal, no drainage   or sinus tenderness  Throat:   Lips, mucosa, and tongue normal; teeth and gums normal  Neck:   Supple, symmetrical, trachea midline, no adenopathy;       thyroid:  No enlargement/tenderness/nodules; no carotid   bruit or JVD  Back:     Symmetric, no curvature, ROM normal, no CVA tenderness  Lungs:     Clear to auscultation bilaterally, respirations unlabored  Chest wall:    No tenderness or deformity  Heart:    Normal heart rate. Normal rhythm. No murmurs, rubs, or gallops.  S1 and S2 normal  Abdomen:     Soft, non-tender, bowel sounds active all four quadrants,    no masses, no organomegaly  Genitalia:    deferred  Rectal:  deferred  Extremities:   All extremities are intact. No cyanosis or edema  Pulses:   2+ and symmetric all extremities  Skin:   Skin color, texture, turgor normal, no rashes or lesions  Lymph nodes:   Cervical, supraclavicular, and axillary nodes normal  Neurologic:   CNII-XII intact. Normal strength, sensation and reflexes      throughout   \ Activities of Daily Living In your present state of health, do you have any difficulty performing the following activities: 06/12/2019 05/14/2019  Hearing? N N  Vision? N Y  Comment - Needs a new eye glass prescription.  Difficulty concentrating or making decisions? N N  Walking or climbing stairs? N N  Dressing or bathing? N N  Doing errands, shopping? N N  Preparing Food and eating ? - N  Using the Toilet? - N  In the past six months, have you accidently leaked urine? - N  Do you have problems with loss of bowel control? - N  Managing your Medications? - N  Managing your Finances? - N  Housekeeping or managing your Housekeeping? - N  Some recent data might be hidden    Fall Risk Assessment Fall Risk  05/14/2019 05/01/2018 04/22/2017 04/28/2016 04/28/2015  Falls in the past year? 0 No No No No      Depression Screen PHQ 2/9 Scores 05/14/2019 05/01/2018 04/22/2017 04/22/2017  PHQ - 2 Score 0 0 0 0  PHQ- 9 Score - 0 0 -    6CIT Screen 04/22/2017  What Year? 0 points  What month? 0 points  What time? 0 points  Count back from 20 0 points  Months in reverse 0 points  Repeat phrase 0 points  Total Score 0       Assessment & Plan:    Annual Physical Reviewed patient's Family Medical History Reviewed and updated list of patient's medical providers Assessment of cognitive impairment was done Assessed patient's functional ability Established a written schedule for health screening [place] Completed and Reviewed  Exercise Activities and Dietary recommendations Goals    . Increase water intake     Recommend increasing water intake to 4-6 glasses a day.        Immunization History  Administered Date(s) Administered  . Influenza, High Dose Seasonal PF 09/29/2016  . Pneumococcal Conjugate-13 02/27/2014  . Pneumococcal Polysaccharide-23 08/23/2011    Health Maintenance  Topic Date Due  . INFLUENZA VACCINE  06/17/2020 (Originally 05/19/2019)  . TETANUS/TDAP  10/18/2026 (Originally 12/17/1957)  . COLONOSCOPY  01/10/2022  . PNA vac Low Risk Adult  Completed     Discussed health benefits of physical activity, and encouraged him to engage in regular exercise appropriate for his age and condition.    ------------------------------------------------------------------------------------------------------------  1. Annual physical exam Generally doing well. He refused flu vaccine today.   2. Prostate cancer screening  - PSA  3. Hepatic cyst  - MR Abdomen W Wo Contrast; Future  4. Pharyngoesophageal dysphagia  - SLP modified barium swallow; Future  5. Generalized abdominal pain  - MR Abdomen W Wo Contrast; Future - Comprehensive metabolic panel - CBC  6. History of prostate cancer Followed by Dr. Beatrix Fetters - PSA - Comprehensive metabolic panel  - CBC  The entirety of the information documented in the History of Present Illness, Review of Systems and Physical Exam were personally obtained by me. Portions of this information were initially documented by Meyer Cory, CMA and reviewed by me for thoroughness and accuracy.  Lelon Huh, MD  [facility]

## 2019-06-13 ENCOUNTER — Telehealth: Payer: Self-pay | Admitting: Family Medicine

## 2019-06-13 ENCOUNTER — Other Ambulatory Visit: Payer: Self-pay | Admitting: Internal Medicine

## 2019-06-13 ENCOUNTER — Telehealth: Payer: Self-pay

## 2019-06-13 DIAGNOSIS — R1314 Dysphagia, pharyngoesophageal phase: Secondary | ICD-10-CM

## 2019-06-13 LAB — CBC
Hematocrit: 42.3 % (ref 37.5–51.0)
Hemoglobin: 14.8 g/dL (ref 13.0–17.7)
MCH: 30.5 pg (ref 26.6–33.0)
MCHC: 35 g/dL (ref 31.5–35.7)
MCV: 87 fL (ref 79–97)
Platelets: 203 10*3/uL (ref 150–450)
RBC: 4.85 x10E6/uL (ref 4.14–5.80)
RDW: 12.2 % (ref 11.6–15.4)
WBC: 5 10*3/uL (ref 3.4–10.8)

## 2019-06-13 LAB — COMPREHENSIVE METABOLIC PANEL
ALT: 11 IU/L (ref 0–44)
AST: 17 IU/L (ref 0–40)
Albumin/Globulin Ratio: 1.6 (ref 1.2–2.2)
Albumin: 4.1 g/dL (ref 3.7–4.7)
Alkaline Phosphatase: 56 IU/L (ref 39–117)
BUN/Creatinine Ratio: 19 (ref 10–24)
BUN: 14 mg/dL (ref 8–27)
Bilirubin Total: 1.1 mg/dL (ref 0.0–1.2)
CO2: 27 mmol/L (ref 20–29)
Calcium: 9.5 mg/dL (ref 8.6–10.2)
Chloride: 104 mmol/L (ref 96–106)
Creatinine, Ser: 0.75 mg/dL — ABNORMAL LOW (ref 0.76–1.27)
GFR calc Af Amer: 100 mL/min/{1.73_m2} (ref >59)
GFR calc non Af Amer: 87 mL/min/{1.73_m2} (ref >59)
Globulin, Total: 2.5 g/dL (ref 1.5–4.5)
Glucose: 97 mg/dL (ref 65–99)
Potassium: 4.2 mmol/L (ref 3.5–5.2)
Sodium: 137 mmol/L (ref 134–144)
Total Protein: 6.6 g/dL (ref 6.0–8.5)

## 2019-06-13 LAB — PSA: Prostate Specific Ag, Serum: <0.1 ng/mL (ref 0.0–4.0)

## 2019-06-13 NOTE — Telephone Encounter (Signed)
Left message advising pt.  (Per DPR)  Thanks,   -Tomeko Scoville  

## 2019-06-13 NOTE — Telephone Encounter (Signed)
Per [facility] barium swallow should be changed to DG OP swallowing func-medicare speech/pathology [id number].,Thanks

## 2019-06-13 NOTE — Telephone Encounter (Signed)
-----   Message from Birdie Sons, MD sent at 06/13/2019  1:46 PM EDT ----- Labs all normal. Check yearly. Continue current medications.

## 2019-06-14 NOTE — Telephone Encounter (Signed)
OK, entered new order

## 2019-06-22 ENCOUNTER — Other Ambulatory Visit: Payer: Self-pay

## 2019-06-22 ENCOUNTER — Ambulatory Visit
Admission: RE | Admit: 2019-06-22 | Discharge: 2019-06-22 | Disposition: A | Discharge status: Home / Self Care | Payer: Medicare Other | Source: Ambulatory Visit | Attending: Family Medicine | Admitting: Family Medicine

## 2019-06-22 DIAGNOSIS — R1084 Generalized abdominal pain: Secondary | ICD-10-CM | POA: Insufficient documentation

## 2019-06-22 DIAGNOSIS — K7689 Other specified diseases of liver: Secondary | ICD-10-CM | POA: Diagnosis not present

## 2019-06-22 MED ORDER — GADOBUTROL 1 MMOL/ML IV SOLN
8.0000 mL | Freq: Once | INTRAVENOUS | Status: AC | PRN
  Administered 2019-06-22: 10:00:00 8 mL via INTRAVENOUS

## 2019-08-02 ENCOUNTER — Ambulatory Visit
Admission: RE | Admit: 2019-08-02 | Discharge: 2019-08-02 | Disposition: A | Discharge status: Home / Self Care | Payer: Medicare Other | Source: Ambulatory Visit | Attending: Family Medicine | Admitting: Family Medicine

## 2019-08-02 ENCOUNTER — Other Ambulatory Visit: Payer: Self-pay

## 2019-08-02 DIAGNOSIS — R1314 Dysphagia, pharyngoesophageal phase: Secondary | ICD-10-CM | POA: Diagnosis present

## 2019-08-02 NOTE — Therapy (Addendum)
[place], [place], [address] Phone: [phone]   Fax:     Modified Barium Swallow  Patient Details  Name: Jared Mullen MRN: [id number] Date of Birth: 02/09/1941 No data recorded  Encounter Date: 08/02/2019  End of Session - 08/02/19 1433    Visit Number  1    Number of Visits  1    Date for SLP Re-Evaluation  08/02/19    SLP Start Time  20    SLP Stop Time   1400    SLP Time Calculation (min)  60 min    Activity Tolerance  Patient tolerated treatment well       Past Medical History:  Diagnosis Date  . Cataract immature right eye   . History of radiation therapy 12/21/11 to 01/24/12   prostate  . Nocturia   . Peyronie's disease   . 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 Sleep apnea   . Urinary frequency   . Weak urinary stream     Past Surgical History:  Procedure Laterality Date  . biospy  09/30/2011-   TRUS/Bx Prostate - 5/12/biopsies positive for cancer, glandular size 32.5 cc  . CHOLECYSTECTOMY  1992  . RADIOACTIVE SEED IMPLANT  02/09/2012   Procedure: RADIOACTIVE SEED IMPLANT;  Surgeon: Franchot Gallo, MD;  Location: [facility];  Service: Urology;  Laterality: N/A;  RAD TECH OK PER ANNE AT MAIN OR   . UVULOPALATOPHARYNGOPLASTY      There were no vitals filed for this visit.       Subjective: Patient behavior: (alertness, ability to follow instructions, etc.): alert, verbally conversive/engaging. Gave history of complaint stating he felt "like the food stops here" pointing to his Mid-Esophagus. Pt endorsed Belching and immediate Full Feelings when eating meals. Pt is not on a PPI and is not followed by a GI.  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  Pt denied any Neurological history; OM exam: WFL, native dentition missing 1-2 molars.  Chief complaint: dysphagia   Objective:  Radiological Procedure: A  videoflouroscopic evaluation of oral-preparatory, reflex initiation, and pharyngeal phases of the swallow was performed; as well as a screening of the upper esophageal phase.  I. POSTURE: upright II. VIEW: lateral; A-P III. COMPENSATORY STRATEGIES: alternating food trials w/ liquid trials to aid Esophageal clearing IV. BOLUSES ADMINISTERED:  Thin Liquid: 6-7 trials  Nectar-thick Liquid: 2 trials  Honey-thick Liquid: NT   Puree: 3 trials  Mechanical Soft: 1 trial V. RESULTS OF EVALUATION: A. ORAL PREPARATORY PHASE: (The lips, tongue, and velum are observed for strength and coordination)       **Overall Severity Rating: WFL. Thorough and timely bolus management and A-P transfer noted during the oral phase; oral clearing achieved post trials.  B. SWALLOW INITIATION/REFLEX: (The reflex is normal if "triggered" by the time the bolus reached the base of the tongue)  **Overall Severity Rating: [facility]. Timing of the pharyngeal swallow initiation appeared [facility] for all trial consistencies at the level of BOT-Valleculae. Adequate airway closure attained during all swallows/consistencies. NO laryngeal penetration or aspiration noted during this study.   C. PHARYNGEAL PHASE: (Pharyngeal function is normal if the bolus shows rapid, smooth, and continuous transit through the pharynx and there is no pharyngeal residue after the swallow)  **Overall Severity Rating: [facility]. No significant pharyngeal phase residue was noted post swallow. Any slight amount cleared fully  w/ an independent, f/u swallow b/t trials being presented. (Pt stated he did not have anything to eat/drink so far today - suspect dry oropharyngeal cavity)  D. LARYNGEAL PENETRATION: (Material entering into the laryngeal inlet/vestibule but not aspirated): NONE E. ASPIRATION: NONE F. 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 Bolus material cleared when a food trial was followed by a liquid trial w/ consistencies/foods given at this evaluation(NO breads or meats given).    ASSESSMENT: Pt appears to present w/ No oropharyngeal phase dysphagia; adequate oropharyngeal phase swallow physiology during this assessment w/ trial consistencies given(no meats, breads). Pt's primary complaint is "solids" stating he feels  "like the food stops here" pointing to his Mid-Esophagus. Pt endorsed Belching and immediate Full Feelings when eating meals. Pt is Not on a PPI and is Not followed by a GI. 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 During the oral phase, thorough and timely bolus management and mastication noted; timely A-P transfer noted w/ full oral clearing achieved post trials. During the pharyngeal phase, timing of the pharyngeal swallow initiation appeared [facility] for All trial consistencies at the level of BOT-Valleculae. Adequate airway closure attained during all swallows/consistencies. NO laryngeal penetration or aspiration noted during this study. No significant pharyngeal phase residue was noted post swallow. Any slight amount cleared fully w/ an independent, f/u swallow b/t trials being presented. (Pt stated he did not have anything to eat/drink so far today - suspect dry oropharyngeal cavity).  Of note, during a brief A-P sweep of the Esophagus w/ just a few trials, Slower bolus Motility and Min bolus Stasis noted in the Mid-upper Esophagus -- suspect an issue such as Presbyesophagus could present in a similar manner. Bolus material cleared when a food trial was followed by a liquid trial during consistencies given at this evaluation(No breads or meats presented during the study which could be more problematic for pt).  ANY Esophageal dysmotility could impact both oral intake and Pulmonary status if retrograde activity occurs and  Reflux material is aspirated. Recommend pt f/u w/ GI for further formal assessment/education and management.      PLAN/RECOMMENDATIONS:  A. Diet: Regular - Mech soft (any meats or chunky foods should be CUT well, moistned); Thin liquids.  MUST MOISTEN FOODS WELL AND ALTERNATE FOODS W/ LIQUIDS DURING MEALS TO AID ESOPHAGEAL CLEARING  B. Swallowing Precautions: general aspiration precautions; REFLUX precautions  C. Recommended consultation to: GI f/u for further formal assessment/education and management.  D. Therapy recommendations: NONE  E. Results and recommendations were discussed w/ pt; video viewed and questions answered, education given, handouts.          Pharyngoesophageal dysphagia - Plan: DG SWALLOW STUDY OP MEDICARE SPEECH PATH, DG SWALLOW STUDY OP MEDICARE SPEECH PATH        Problem List Patient Active Problem List   Diagnosis Date Noted  . 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  . 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 H/O adenomatous polyp of colon 11/18/2011  . History of prostate cancer 11/04/2011        Orinda Kenner, [place], CCC-SLP , 08/02/2019, 2:34 PM  [facility] [place], [place], [address] Phone: [phone]   Fax:     Name: TURKI TAPANES MRN: [id number]  Date of Birth: 12-16-1940

## 2019-08-10 ENCOUNTER — Telehealth: Payer: Self-pay

## 2019-08-10 NOTE — Telephone Encounter (Signed)
-----   Message from Birdie Sons, MD sent at 08/08/2019  2:46 PM EDT ----- Please advise patient that per speech therapist there is no trouble swallowing, but solid food seems to get hung up in mid esophagus. He probably has acid reflux and should start omeprazole 20mg  once daily, #30, rf x 3 Also need referral to GI for further evaluation to make sure there is no scarring or masses causing food to get hung up. Please enter referral.

## 2019-08-10 NOTE — Telephone Encounter (Signed)
LMTCB

## 2019-08-13 NOTE — Telephone Encounter (Signed)
LMTCB

## 2019-08-15 NOTE — Telephone Encounter (Signed)
Patient advised as below. Patient declined GI referral and starting Omeprazole. He says he wants to hold off on both of these for now. Patient states he would call back if he changes his mind.

## 2020-05-08 ENCOUNTER — Telehealth: Payer: Self-pay | Admitting: Family Medicine

## 2020-05-08 NOTE — Telephone Encounter (Signed)
Copied from [place] [id number]. Topic: Medicare AWV >> May 08, 2020  2:33 PM Cher Nakai R wrote: Reason for CRM: Left message for patient to call back and schedule Medicare Annual Wellness Visit (AWV) either virtually or in office.  Last AWV 05/14/2019   Please schedule at anytime with [facility] Health Advisor.

## 2020-06-13 ENCOUNTER — Encounter: Payer: Self-pay | Admitting: Family Medicine

## 2020-08-19 ENCOUNTER — Encounter: Payer: Self-pay | Admitting: Family Medicine

## 2020-09-09 IMAGING — RF DG SWALLOWING FUNCTION
12 series · 13 of 24 positions shown · non-contrast
Comparison: No prior.

CLINICAL DATA: Dysphagia.

EXAM:
MODIFIED BARIUM SWALLOW
TECHNIQUE: Different consistencies of barium were administered orally to the
patient by the Speech Pathologist. Imaging of the pharynx was
performed in the lateral projection. The radiologist was present in
the fluoroscopy room for this study, providing personal supervision.
FLUOROSCOPY TIME:  Fluoroscopy Time:  2 minutes 6 seconds
Radiation Exposure Index (if provided by the fluoroscopic device):
16.8 mGy

[Series 1: run · 1 of 106 frames shown (1 of 12)]
[frame 16/106]
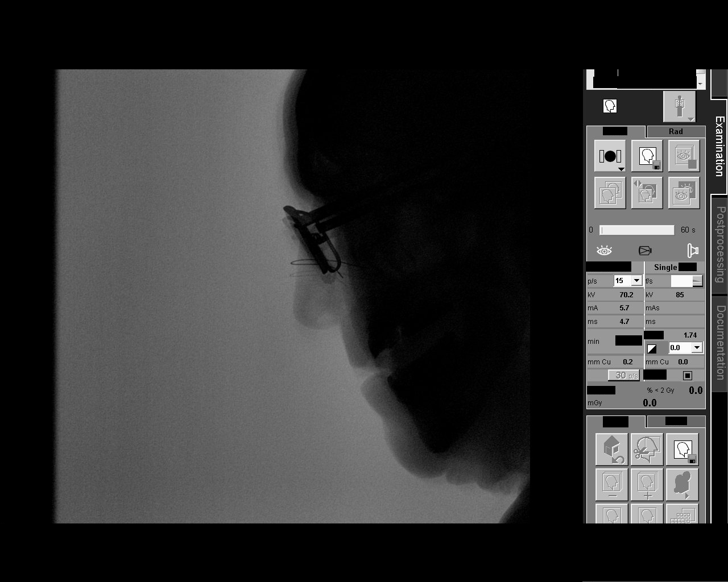

[Series 2: run · 1 of 217 frames shown (2 of 12)]
[frame 31/217]
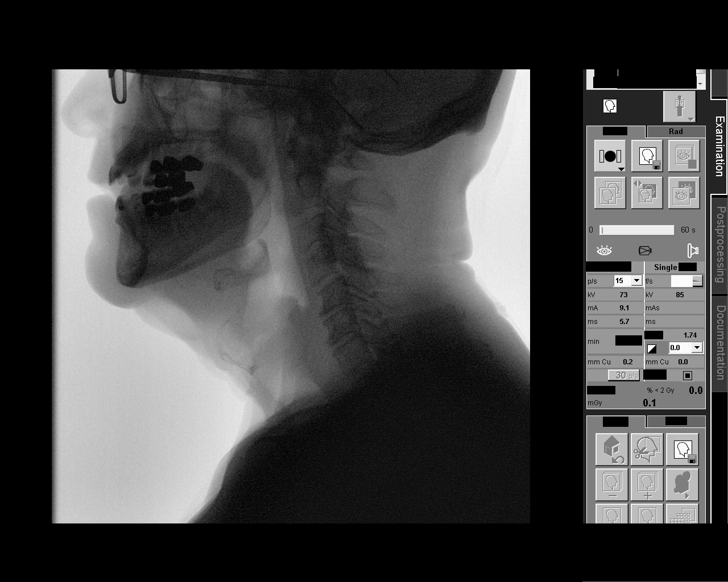

[Series 3: run · 1 of 570 frames shown (3 of 12)]
[frame 86/570]
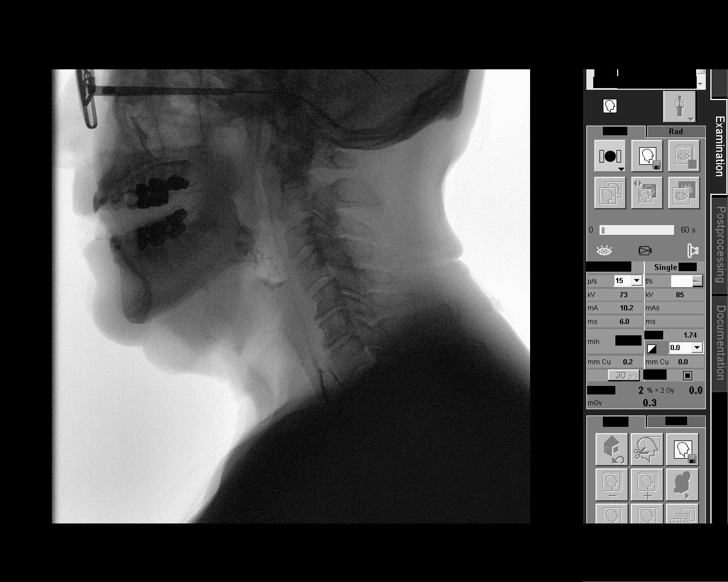

[Series 4: run · 1 of 388 frames shown (4 of 12)]
[frame 59/388]
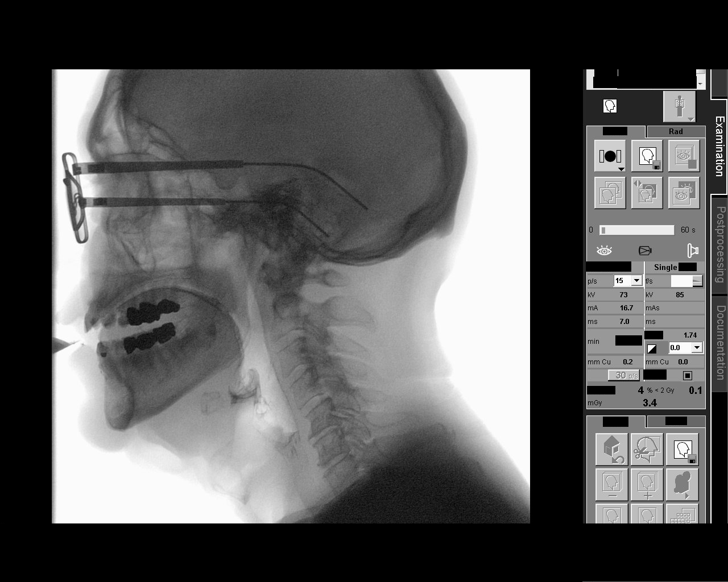

[Series 5: run · 1 of 19 frames shown (5 of 12)]
[frame 3/19]
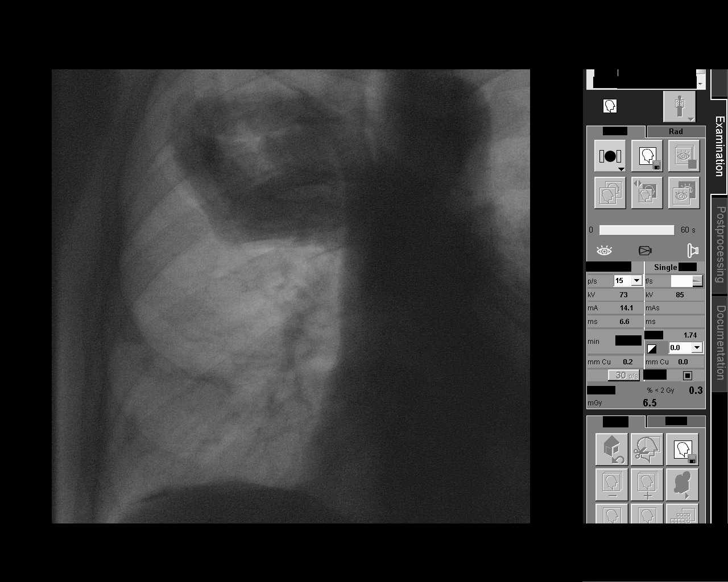

[Series 6: run · 1 of 215 frames shown (6 of 12)]
[frame 33/215]
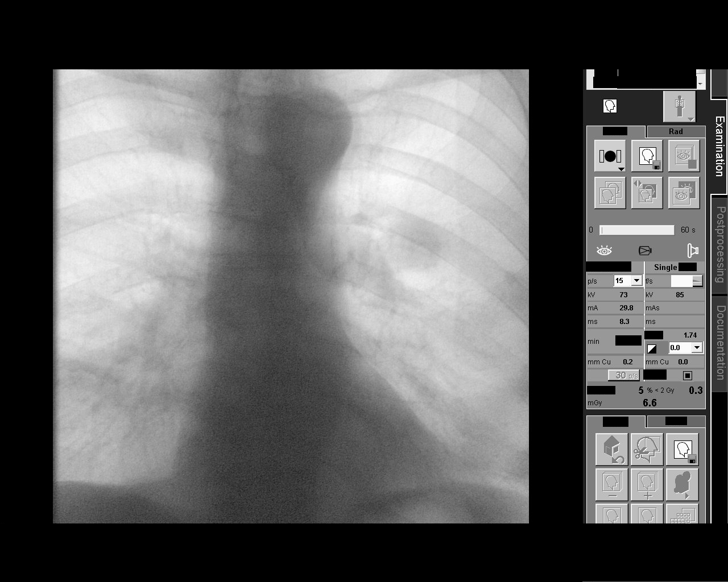

[Series 7: run · 2 of 731 frames shown (7 of 12)]
[frame 110/731]
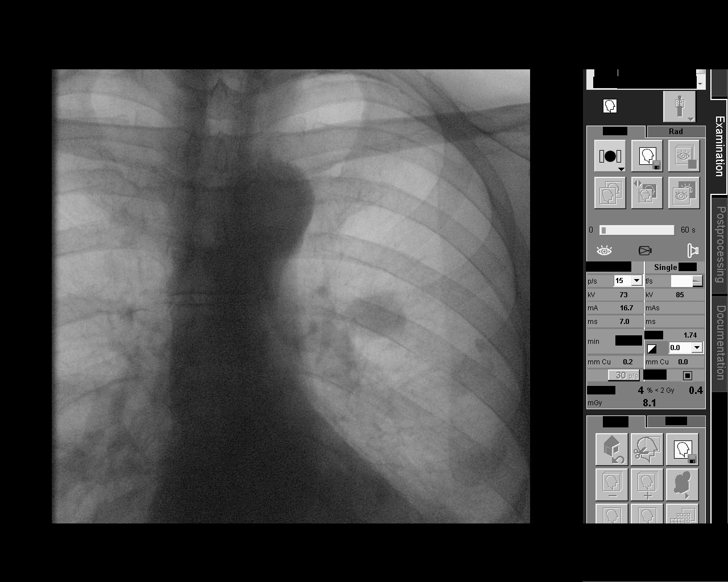
[frame 366/731]
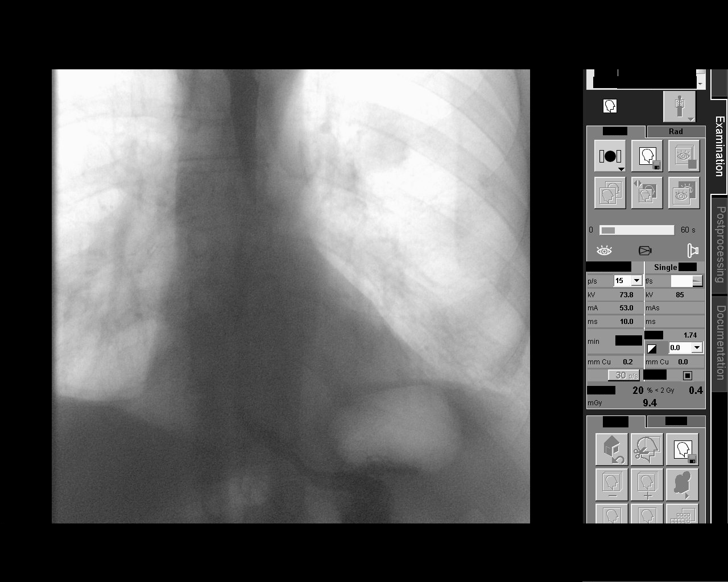

[Series 8: run · 1 of 878 frames shown (8 of 12)]
[frame 440/878]
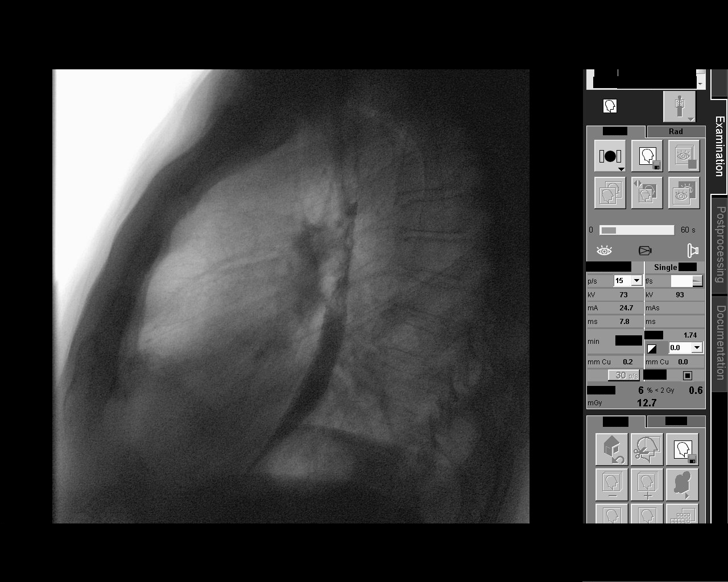

[Series 9: run · 1 of 20 frames shown (9 of 12)]
[frame 18/20]
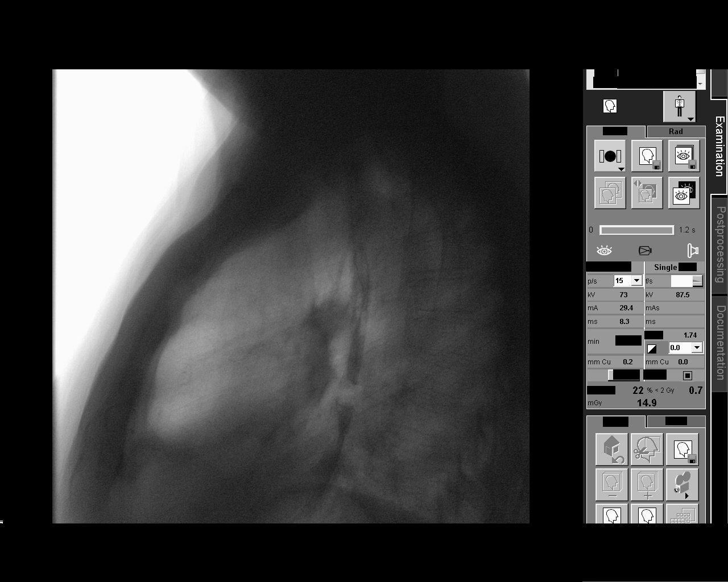

[Series 10: run · 1 of 236 frames shown (10 of 12)]
[frame 201/236]
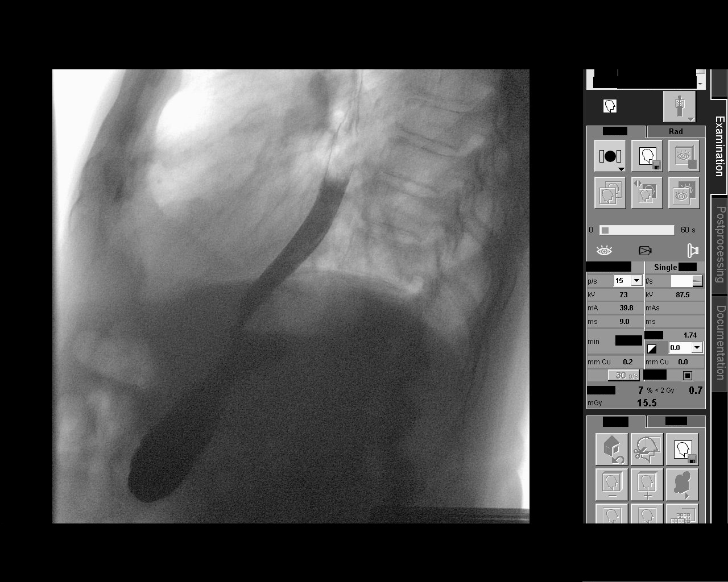

[Series 11: run · 1 of 119 frames shown (11 of 12)]
[frame 102/119]
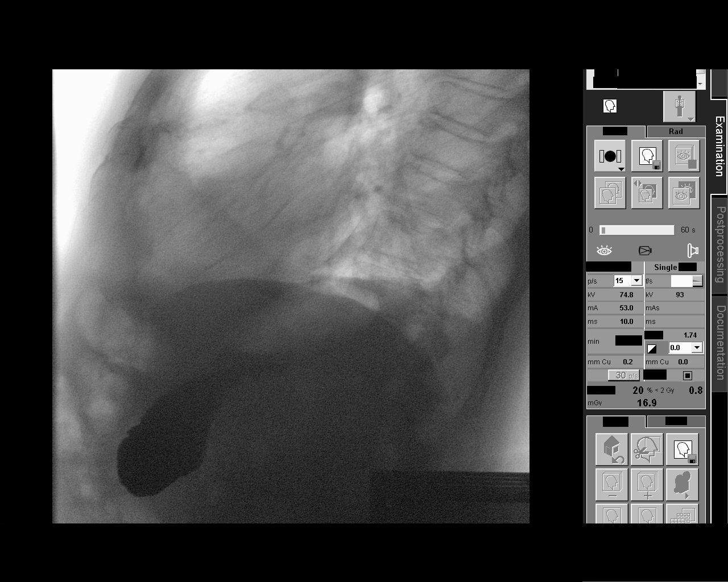

[Series 12: run · 1 of 243 frames shown (12 of 12)]
[frame 207/243]
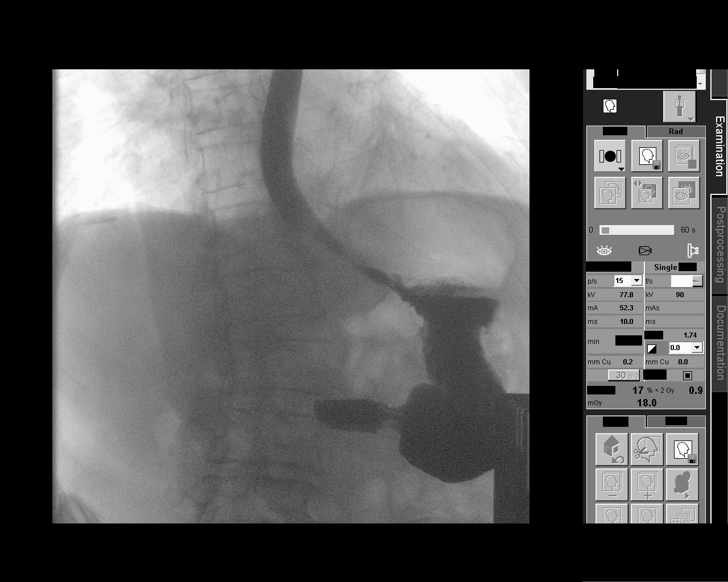

[13 of 24 positions shown; findings below may reference images not displayed]

FINDINGS: No aspiration identified. No obstructing abnormalities identified.
Please refer to speech pathologist report for complete details and
recommendations.
IMPRESSION: No aspiration identified.  No obstructing abnormalities identified.

Please refer to the Speech Pathologists report for complete details
and recommendations.

## 2020-10-31 ENCOUNTER — Other Ambulatory Visit: Payer: Self-pay

## 2020-10-31 ENCOUNTER — Encounter: Payer: Self-pay | Admitting: Family Medicine

## 2020-10-31 ENCOUNTER — Ambulatory Visit (INDEPENDENT_AMBULATORY_CARE_PROVIDER_SITE_OTHER): Payer: Medicare Other | Admitting: Family Medicine

## 2020-10-31 VITALS — BP 128/77 | HR 77 | Temp 98.2°F | Resp 16 | Ht 70.0 in | Wt 178.2 lb

## 2020-10-31 DIAGNOSIS — H00011 Hordeolum externum right upper eyelid: Secondary | ICD-10-CM | POA: Diagnosis not present

## 2020-10-31 DIAGNOSIS — Z0000 Encounter for general adult medical examination without abnormal findings: Secondary | ICD-10-CM | POA: Diagnosis not present

## 2020-10-31 DIAGNOSIS — Z23 Encounter for immunization: Secondary | ICD-10-CM

## 2020-10-31 DIAGNOSIS — R059 Cough, unspecified: Secondary | ICD-10-CM

## 2020-10-31 DIAGNOSIS — Z125 Encounter for screening for malignant neoplasm of prostate: Secondary | ICD-10-CM

## 2020-10-31 MED ORDER — TETANUS-DIPHTH-ACELL PERTUSSIS 5-2.5-18.5 LF-MCG/0.5 IM SUSY: 0.5000 mL | PREFILLED_SYRINGE | Freq: Once | INTRAMUSCULAR | 0 refills | Status: AC

## 2020-10-31 MED ORDER — DOXYCYCLINE HYCLATE 100 MG PO TABS: 100.0000 mg | ORAL_TABLET | Freq: Two times a day (BID) | ORAL | 0 refills | Status: AC

## 2020-10-31 MED ORDER — CIPROFLOXACIN HCL 0.3 % OP SOLN: 1.0000 [drp] | OPHTHALMIC | 0 refills | Status: AC

## 2020-10-31 NOTE — Progress Notes (Signed)
Complete Physical Exam     Patient: Jared Mullen, Male    DOB: Jul 26, 1941, 80 y.o.   MRN: [id number] Visit Date: 10/31/2020  Today's Provider: Lelon Huh, MD    Subjective    Jared Mullen is a 80 y.o. male who presents today for his yearly physical. He reports consuming a general diet. The patient does not participate in regular exercise at present. He generally feels fairly well. He reports sleeping fairly well. He does have additional problems to discuss today.   HPI Follow up for history of Prostate Cancer:  The patient was last seen for this 06/12/2019.   Changes made at last visit include none. This problem is followed by Dr. Beatrix Fetters.  He reports good compliance with treatment. He feels that condition is Unchanged. He is not having side effects.   ---------------------------------------------------------------------------------  Eye problem: Patient complains of a knot on his right upper eyelid. The knot appeared 3 weeks ago and is unchanged in size. Patient denies any pain.   Complains of nighttime cough for about 3 months. No night sweats. No dyspnea. Non productive. Occasionally coughs in the morning. No heartburn or other reflux symptoms. Does have some sinus congestion and drainage at night.        Medications: No outpatient medications prior to visit.   No facility-administered medications prior to visit.    No Known Allergies  Patient Care Team: Birdie Sons, MD as PCP - General (Family Medicine) Franchot Gallo, MD as Consulting Physician (Urology)  Review of Systems  Constitutional: Negative for appetite change, chills, fatigue and fever.  HENT: Negative for congestion, ear pain, hearing loss, nosebleeds and trouble swallowing.   Eyes: Negative for pain and visual disturbance.       Mass on upper right eyelid  Respiratory: Negative for cough, chest tightness and shortness of breath.   Cardiovascular: Negative for chest pain,  palpitations and leg swelling.  Gastrointestinal: Negative for abdominal pain, blood in stool, constipation, diarrhea, nausea and vomiting.  Endocrine: Negative for polydipsia, polyphagia and polyuria.  Genitourinary: Negative for dysuria and flank pain.  Musculoskeletal: Negative for arthralgias, back pain, joint swelling, myalgias and neck stiffness.  Skin: Negative for color change, rash and wound.  Neurological: Negative for dizziness, tremors, seizures, speech difficulty, weakness, light-headedness and headaches.  Psychiatric/Behavioral: Negative for behavioral problems, confusion, decreased concentration, dysphoric mood and sleep disturbance. The patient is not nervous/anxious.   All other systems reviewed and are negative.     Objective    Vitals: BP 128/77 (BP Location: Right Arm, Patient Position: Sitting, Cuff Size: Large)   Pulse 77   Temp 98.2 F (36.8 C) (Temporal)   Resp 16   Ht 5\' 10"  (1.778 m)   Wt 178 lb 3.2 oz (80.8 kg)   BMI 25.57 kg/m    Physical Exam  General Appearance:     Overweight male. Alert, cooperative, in no acute distress, appears stated age  Head:    Normocephalic, without obvious abnormality, atraumatic  Eyes:    PERRL, conjunctiva/corneas clear, EOM's intact, fundi    benign, both eyes. BB sized hordeolum right upper eyelid  Ears:    Normal TM's and external ear canals, both ears  Neck:   Supple, symmetrical, trachea midline, no adenopathy;       thyroid:  No enlargement/tenderness/nodules; no carotid   bruit or JVD  Back:     Symmetric, no curvature, ROM normal, no CVA tenderness  Lungs:     Clear  to auscultation bilaterally, respirations unlabored  Chest wall:    No tenderness or deformity  Heart:    Normal heart rate. Normal rhythm. No murmurs, rubs, or gallops.  S1 and S2 normal  Abdomen:     Soft, non-tender, bowel sounds active all four quadrants,    no masses, no organomegaly  Genitalia:    deferred  Rectal:    deferred   Extremities:   All extremities are intact. No cyanosis or edema  Pulses:   2+ and symmetric all extremities  Skin:   Skin color, texture, turgor normal, no rashes or lesions  Lymph nodes:   Cervical, supraclavicular, and axillary nodes normal  Neurologic:   CNII-XII intact. Normal strength, sensation and reflexes      throughout      Assessment & Plan     1. Annual physical exam Generally doing very well with normal exam aside from below.  - Comprehensive metabolic panel  He's not interested in shingles vaccine.   2. Hordeolum externum of right upper eyelid - ciprofloxacin (CILOXAN) 0.3 % ophthalmic solution; Place 1 drop into both eyes every 4 (four) hours while awake for 7 days.  Dispense: 5 mL; Refill: 0   Call for ophthalmology referral if not improving over the next week or two.   3. Prescription for Tdap. Vaccine not administered in office.   - Tdap (Coke) 5-2.5-18.5 LF-MCG/0.5 injection; Inject 0.5 mLs into the muscle once for 1 dose.  Dispense: 0.5 mL; Refill: 0  5. Need for influenza vaccination  - Flu Vaccine QUAD High Dose IM (Fluad)  6. Prostate cancer screening  - PSA  7. Cough Persistent nocturnal only cough for 3 months. No reflux symptoms. Advised to call if not improving after finishing - doxycycline (VIBRA-TABS) 100 MG tablet; Take 1 tablet (100 mg total) by mouth 2 (two) times daily for 10 days.  Dispense: 20 tablet; Refill: 0     The entirety of the information documented in the History of Present Illness, Review of Systems and Physical Exam were personally obtained by me. Portions of this information were initially documented by the CMA and reviewed by me for thoroughness and accuracy.      Lelon Huh, MD  [facility] [phone] (phone) [phone] (fax)  [place]

## 2020-10-31 NOTE — Patient Instructions (Addendum)
.   You are due for a Tdap (tetanus-diptheria-pertussis vaccine) which protects you from tetanus and whooping cough. Please check with your insurance plan or pharmacy regarding coverage for this vaccine.     The CDC recommends two doses of Shingrix (the shingles vaccine) separated by 2 to 6 months for adults age 82 years and older. I recommend checking with your insurance plan regarding coverage for this vaccine.    . Please contact your eyecare professional to schedule a routine eye exam. I recommend seeing Dr. Jomarie Longs at [phone] or the [facility] at [phone]

## 2020-10-31 NOTE — Progress Notes (Signed)
Annual Wellness Visit     Patient: Jared Mullen, Male    DOB: April 01, 1941, 80 y.o.   MRN: [id number] Visit Date: 10/31/2020  Today's Provider: Lelon Huh, MD   Chief Complaint  Patient presents with  . Medicare Wellness   Subjective    Jared Mullen is a 80 y.o. male who presents today for his Annual Wellness Visit.      Medications: No outpatient medications prior to visit.   No facility-administered medications prior to visit.    No Known Allergies  Patient Care Team: Birdie Sons, MD as PCP - General (Family Medicine) Franchot Gallo, MD as Consulting Physician (Urology)     Objective     Most recent functional status assessment: In your present state of health, do you have any difficulty performing the following activities: 10/31/2020  Hearing? N  Vision? N  Difficulty concentrating or making decisions? N  Walking or climbing stairs? N  Dressing or bathing? N  Doing errands, shopping? N  Some recent data might be hidden   Most recent fall risk assessment: Fall Risk  10/31/2020  Falls in the past year? 0  Number falls in past yr: 0  Injury with Fall? 0  Follow up Falls evaluation completed    Most recent depression screenings: PHQ 2/9 Scores 10/31/2020 05/14/2019  PHQ - 2 Score 0 0  PHQ- 9 Score 0 -   Most recent cognitive screening: 6CIT Screen 04/22/2017  What Year? 0 points  What month? 0 points  What time? 0 points  Count back from 20 0 points  Months in reverse 0 points  Repeat phrase 0 points  Total Score 0   Most recent Audit-C alcohol use screening Alcohol Use Disorder Test (AUDIT) 10/31/2020  1. How often do you have a drink containing alcohol? 0  2. How many drinks containing alcohol do you have on a typical day when you are drinking? 0  3. How often do you have six or more drinks on one occasion? 0  AUDIT-C Score 0  Alcohol Brief Interventions/Follow-up AUDIT Score <7 follow-up not indicated   A score of 3 or more in women,  and 4 or more in men indicates increased risk for alcohol abuse, EXCEPT if all of the points are from question 1   No results found for any visits on 10/31/20.  Assessment & Plan     Annual wellness visit done today including the all of the following: Reviewed patient's Family Medical History Reviewed and updated list of patient's medical providers Assessment of cognitive impairment was done Assessed patient's functional ability Established a written schedule for health screening [name] Completed and Reviewed  Exercise Activities and Dietary recommendations Goals    . Increase water intake     Recommend increasing water intake to 4-6 glasses a day.        Immunization History  Administered Date(s) Administered  . Influenza, High Dose Seasonal PF 09/29/2016  . Pneumococcal Conjugate-13 02/27/2014  . Pneumococcal Polysaccharide-23 08/23/2011    Health Maintenance  Topic Date Due  . COVID-19 Vaccine (1) Never done  . INFLUENZA VACCINE  05/18/2020  . TETANUS/TDAP  10/18/2026 (Originally 12/17/1959)  . COLONOSCOPY (Pts 45-50yrs Insurance coverage will need to be confirmed)  01/10/2022  . PNA vac Low Risk Adult  Completed     Discussed health benefits of physical activity, and encouraged him to engage in regular exercise appropriate for his age and condition.  No follow-ups on file.     The entirety of the information documented in the History of Present Illness, Review of Systems and Physical Exam were personally obtained by me. Portions of this information were initially documented by the CMA and reviewed by me for thoroughness and accuracy.      Lelon Huh, MD  [facility] [phone] (phone) [phone] (fax)  [place]

## 2020-11-01 LAB — COMPREHENSIVE METABOLIC PANEL
ALT: 10 IU/L (ref 0–44)
AST: 15 IU/L (ref 0–40)
Albumin/Globulin Ratio: 1.7 (ref 1.2–2.2)
Albumin: 4.3 g/dL (ref 3.6–4.6)
Alkaline Phosphatase: 73 IU/L (ref 44–121)
BUN/Creatinine Ratio: 15 (ref 10–24)
BUN: 13 mg/dL (ref 8–27)
Bilirubin Total: 1.1 mg/dL (ref 0.0–1.2)
CO2: 25 mmol/L (ref 20–29)
Calcium: 9.8 mg/dL (ref 8.6–10.2)
Chloride: 100 mmol/L (ref 96–106)
Creatinine, Ser: 0.85 mg/dL (ref 0.76–1.27)
GFR calc Af Amer: 94 mL/min/{1.73_m2} (ref >59)
GFR calc non Af Amer: 82 mL/min/{1.73_m2} (ref >59)
Globulin, Total: 2.6 g/dL (ref 1.5–4.5)
Glucose: 87 mg/dL (ref 65–99)
Potassium: 4.2 mmol/L (ref 3.5–5.2)
Sodium: 141 mmol/L (ref 134–144)
Total Protein: 6.9 g/dL (ref 6.0–8.5)

## 2020-11-01 LAB — PSA: Prostate Specific Ag, Serum: <0.1 ng/mL (ref 0.0–4.0)

## 2020-11-06 ENCOUNTER — Telehealth: Payer: Self-pay

## 2020-11-06 DIAGNOSIS — Z13820 Encounter for screening for osteoporosis: Secondary | ICD-10-CM

## 2020-11-06 NOTE — Telephone Encounter (Signed)
Please advise 

## 2020-11-06 NOTE — Telephone Encounter (Signed)
Pt requesting a call back / pleas advise

## 2020-11-06 NOTE — Telephone Encounter (Signed)
-----   Message from Anselm Lis, RN sent at 11/06/2020 10:37 AM EST -----  Patient returned call and was read lab note written by Dr Caryn Section 11/04/19. He verbalized understanding.  He has two questions.  1) his insurance wants him to have a bone density test Please advise patient.                    2) he has what looks like an RX for tetanus injection. Was he to have that done at lab at office or at         another facility. Please advise patient.

## 2020-11-06 NOTE — Telephone Encounter (Signed)
Bone density tests are not usually covered in men unless they are at high risk for osteoporosis, likely being on chronic steroids. We can order one, but should probably call his insurance company to make sure it is covered. The tetanus prescription is to take to the pharmacy. Insurance only covers the tetanus vaccine if it is given at a pharmacy, and he is due for one.

## 2020-11-06 NOTE — Telephone Encounter (Signed)
Patient advised and verbalized understanding 

## 2020-11-07 NOTE — Telephone Encounter (Signed)
I called and spoke with patient. He says he called his insurance [facility]) and they told him that his current plan doesn't specify gender for bone density test. Patient says that he was informed by his insurance that the bone density test is recommended for him. Patient would like to proceed with having this done.

## 2020-11-07 NOTE — Addendum Note (Signed)
Addended by: Birdie Sons on: 11/07/2020 04:20 PM   Modules accepted: Orders

## 2020-12-15 ENCOUNTER — Telehealth: Payer: Self-pay | Admitting: *Deleted

## 2020-12-15 ENCOUNTER — Other Ambulatory Visit: Payer: Self-pay

## 2020-12-15 ENCOUNTER — Telehealth: Payer: Self-pay

## 2020-12-15 ENCOUNTER — Ambulatory Visit
Admission: RE | Admit: 2020-12-15 | Discharge: 2020-12-15 | Disposition: A | Discharge status: Home / Self Care | Payer: Medicare Other | Source: Ambulatory Visit | Attending: Family Medicine | Admitting: Family Medicine

## 2020-12-15 ENCOUNTER — Encounter: Payer: Self-pay | Admitting: Family Medicine

## 2020-12-15 DIAGNOSIS — Z8546 Personal history of malignant neoplasm of prostate: Secondary | ICD-10-CM | POA: Insufficient documentation

## 2020-12-15 DIAGNOSIS — Z13820 Encounter for screening for osteoporosis: Secondary | ICD-10-CM | POA: Insufficient documentation

## 2020-12-15 DIAGNOSIS — Z923 Personal history of irradiation: Secondary | ICD-10-CM | POA: Insufficient documentation

## 2020-12-15 DIAGNOSIS — M8589 Other specified disorders of bone density and structure, multiple sites: Secondary | ICD-10-CM | POA: Diagnosis not present

## 2020-12-15 DIAGNOSIS — M8580 Other specified disorders of bone density and structure, unspecified site: Secondary | ICD-10-CM | POA: Insufficient documentation

## 2020-12-15 NOTE — Telephone Encounter (Signed)
Pt called but did not answer. VM left for pt to return call. [facility] may release results.

## 2020-12-15 NOTE — Telephone Encounter (Signed)
-----   Message from Birdie Sons, MD sent at 12/15/2020  1:03 PM EST ----- Bone density shows osteopenia, some thinning of the bones, but not severe enough to require medications. Needs to repeat in 4-5 years.

## 2020-12-15 NOTE — Telephone Encounter (Signed)
Reviewed physician's note on DEXA results with the patient. Answered all questions.

## 2021-10-16 ENCOUNTER — Ambulatory Visit
Admission: EM | Admit: 2021-10-16 | Discharge: 2021-10-16 | Disposition: A | Discharge status: Home / Self Care | Payer: Medicare Other | Attending: Emergency Medicine | Admitting: Emergency Medicine

## 2021-10-16 ENCOUNTER — Other Ambulatory Visit: Payer: Self-pay

## 2021-10-16 DIAGNOSIS — J069 Acute upper respiratory infection, unspecified: Secondary | ICD-10-CM

## 2021-10-16 MED ORDER — IPRATROPIUM BROMIDE 0.06 % NA SOLN: 2.0000 | Freq: Four times a day (QID) | NASAL | 12 refills | Status: DC

## 2021-10-16 MED ORDER — ONDANSETRON 8 MG PO TBDP: 8.0000 mg | ORAL_TABLET | Freq: Three times a day (TID) | ORAL | 0 refills | Status: DC | PRN

## 2021-10-16 MED ORDER — BENZONATATE 100 MG PO CAPS: 200.0000 mg | ORAL_CAPSULE | Freq: Three times a day (TID) | ORAL | 0 refills | Status: DC

## 2021-10-16 NOTE — ED Provider Notes (Signed)
[facility]    CSN: [id number] Arrival date & time: 10/16/21  1008      History   Chief Complaint Chief Complaint  Patient presents with   Cough    HPI ROLLAND STEINERT is a 80 y.o. male.   HPI  80 year old male here for evaluation of multiple complaints.  Patient reports that he has been experiencing a cough for the past month that is intermittently productive for a clear sputum.  This has been associated with postnasal drip, ear pain, and loose stools.  2 weeks ago he developed a decreased appetite and nausea.  The decreased appetite and nausea coincide with when his spouse died.  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  He denies fever, runny nose or nasal congestion, sore throat, shortness of breath or wheezing, or vomiting.  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   Seborrhea capitis 04/24/2015   H/O adenomatous polyp of colon 11/18/2011   History of prostate cancer 11/04/2011    Past Surgical History:  Procedure Laterality Date   biospy  09/30/2011-   TRUS/Bx Prostate - 5/12/biopsies positive for cancer, glandular size 32.5 cc   CHOLECYSTECTOMY  1992   RADIOACTIVE SEED IMPLANT  02/09/2012   Procedure: RADIOACTIVE SEED IMPLANT;  Surgeon: Franchot Gallo, MD;  [facility];  Service: Urology;  Laterality: N/A;  RAD TECH OK PER ANNE AT MAIN OR    UVULOPALATOPHARYNGOPLASTY         Home Medications    Prior to Admission medications   Medication Sig Start Date End Date Taking? Authorizing Provider  benzonatate (TESSALON) 100 MG capsule Take 2  capsules (200 mg total) by mouth every 8 (eight) hours. 10/16/21  Yes Margarette Canada, NP  ipratropium (ATROVENT) 0.06 % nasal spray Place 2 sprays into both nostrils 4 (four) times daily. 10/16/21  Yes Margarette Canada, NP  ondansetron (ZOFRAN-ODT) 8 MG disintegrating tablet Take 1 tablet (8 mg total) by mouth every 8 (eight) hours as needed for nausea or vomiting. 10/16/21  Yes Margarette Canada, NP    Family History Family History  Problem Relation Age of Onset   Heart disease Father    Breast cancer Sister    Cancer Sister        lung   Lung cancer Brother    Cancer Brother        bladder   Lung cancer Sister    Cancer Sister        breast   Kidney disease Brother     Social History Social History   Tobacco Use   Smoking status: Former    Packs/day: 2.00    Years: 28.00    Pack years: 56.00    Types: Cigarettes    Quit date: 10/18/1980    Years since quitting: 41.0   Smokeless tobacco: Never  Substance Use Topics   Alcohol use: No   Drug use: No     Allergies   Patient has no known allergies.  Review of Systems Review of Systems  Constitutional:  Positive for appetite change. Negative for activity change and fever.  HENT:  Positive for ear pain and postnasal drip. Negative for congestion, rhinorrhea and sore throat.   Respiratory:  Positive for cough. Negative for shortness of breath and wheezing.   Gastrointestinal:  Negative for diarrhea, nausea and vomiting.  Skin:  Negative for rash.  Hematological: Negative.   Psychiatric/Behavioral: Negative.      Physical Exam Triage Vital Signs ED Triage Vitals  Enc Vitals Group     BP 10/16/21 1039 (!) 134/95     Pulse Rate 10/16/21 1039 71     Resp 10/16/21 1039 18     Temp 10/16/21 1039 98.4 F (36.9 C)     Temp src --      SpO2 10/16/21 1039 100 %     Weight --      Height --      Head Circumference --      Peak Flow --      Pain Score 10/16/21 1038 0     Pain Loc --      Pain Edu? --      Excl. in [place]? --     No data found.  Updated Vital Signs BP (!) 134/95    Pulse 71    Temp 98.4 F (36.9 C)    Resp 18    SpO2 100%   Visual Acuity Right Eye Distance:   Left Eye Distance:   Bilateral Distance:    Right Eye Near:   Left Eye Near:    Bilateral Near:     Physical Exam Vitals and nursing note reviewed.  Constitutional:      General: He is not in acute distress.    Appearance: Normal appearance. He is normal weight. He is not ill-appearing.  HENT:     Head: Normocephalic and atraumatic.     Right Ear: Tympanic membrane, ear canal and external ear normal. There is no impacted cerumen.     Left Ear: Tympanic membrane, ear canal and external ear normal. There is no impacted cerumen.     Nose: Congestion and rhinorrhea present.     Mouth/Throat:     Mouth: Mucous membranes are moist.     Pharynx: Posterior oropharyngeal erythema present.  Cardiovascular:     Rate and Rhythm: Normal rate and regular rhythm.     Pulses: Normal pulses.     Heart sounds: Normal heart sounds. No murmur heard.   No gallop.  Pulmonary:     Effort: Pulmonary effort is normal.     Breath sounds: Normal breath sounds. No wheezing, rhonchi or rales.  Musculoskeletal:     Cervical back: Normal range of motion and neck supple.  Lymphadenopathy:     Cervical: No cervical adenopathy.  Skin:    General: Skin is warm.     Capillary Refill: Capillary refill takes less than 2 seconds.     Findings: No erythema or rash.  Neurological:     General: No focal deficit present.     Mental Status: He is alert and oriented to person, place, and time.  Psychiatric:        Mood and Affect: Mood normal.        Behavior: Behavior normal.        Thought Content: Thought content normal.        Judgment: Judgment normal.     UC Treatments / Results  Labs (all labs ordered are  listed, but only abnormal results are displayed) Labs Reviewed - No data to display  EKG   Radiology No results  found.  Procedures Procedures (including critical care time)  Medications Ordered in UC Medications - No data to display  Initial Impression / Assessment and Plan / UC Course  I have reviewed the triage vital signs and the nursing notes.  Pertinent labs & imaging results that were available during my care of the patient were reviewed by me and considered in my medical decision making (see chart for details).  80 year old male here for evaluation of cough x1 month and decreased appetite x2 weeks.  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  Patient does endorse postnasal drip, loose stools, and intermittently productive cough for clear sputum.  He also states that he is coughed so much that his ears hurt.  His physical exam reveals pearly gray tympanic membranes bilaterally with normal light reflex and clear external auditory canals.  Nasal mucosa is erythematous and edematous with clear discharge in both nares.  Oropharyngeal exam reveals posterior oropharyngeal erythema and injection with clear postnasal drip.  No cervical lymphadenopathy appreciated on exam.  Cardiopulmonary exam reveals clear lung sounds in all fields.  I do believe that the patient has an upper respiratory infection, most likely viral.  His protracted symptoms may very well be due to his increased stress levels being a 24/7 caregiver and the death of his spouse.  I will give him Atrovent nasal spray to help with the nasal congestion and postnasal drip and Tessalon Perles help him with his cough.  He can use Delsym, Zarbee's, or Robitussin over-the-counter as needed at nighttime for cough symptom relief.  I will also give Zofran to help with the nausea.  There is some concern in my mind that this might be developing failure to thrive following the death of a long-term spouse.  Patient reports that he has lost what he terms  a significant amount of weight since the nausea developed.  Patient does have a follow-up appointment with his PCP at the end of January and I have encouraged him to keep that appointment.  Of also encouraged him to return for any new or worsening symptoms.  I have also messaged the patient's PCP at [facility] family practice to advise him of my concerns.   Final Clinical Impressions(s) / UC Diagnoses   Final diagnoses:  Viral URI with cough     Discharge Instructions      Use the Atrovent nasal spray, 2 squirts in each nostril every 6 hours, as needed for runny nose and postnasal drip.  Use the Tessalon Perles every 8 hours during the day.  Take them with a small sip of water.  They may give you some numbness to the base of your tongue or a metallic taste in your mouth, this is normal.  Use the Robitussin DM cough syrup at bedtime for cough and congestion.    Use the Zofran every 8 hours as needed for nausea.  Return for reevaluation or see your primary care provider for any new or worsening symptoms.      ED Prescriptions     Medication Sig Dispense Auth. Provider   benzonatate (TESSALON) 100 MG capsule Take 2 capsules (200 mg total) by mouth every 8 (eight) hours. 21 capsule Margarette Canada, NP   ipratropium (ATROVENT) 0.06 %  nasal spray Place 2 sprays into both nostrils 4 (four) times daily. 15 mL Margarette Canada, NP   ondansetron (ZOFRAN-ODT) 8 MG disintegrating tablet Take 1 tablet (8 mg total) by mouth every 8 (eight) hours as needed for nausea or vomiting. 20 tablet Margarette Canada, NP      PDMP not reviewed this encounter.   Margarette Canada, NP 10/16/21 1258

## 2021-10-16 NOTE — ED Triage Notes (Signed)
Pt presents with complaints of cough x 1 month. Pt also reports decreased appetite x 2 weeks. Reports his wife recently passed away.

## 2021-10-16 NOTE — Discharge Instructions (Signed)
Use the Atrovent nasal spray, 2 squirts in each nostril every 6 hours, as needed for runny nose and postnasal drip.  Use the Tessalon Perles every 8 hours during the day.  Take them with a small sip of water.  They may give you some numbness to the base of your tongue or a metallic taste in your mouth, this is normal.  Use the Robitussin DM cough syrup at bedtime for cough and congestion.    Use the Zofran every 8 hours as needed for nausea.  Return for reevaluation or see your primary care provider for any new or worsening symptoms.

## 2021-10-30 NOTE — Progress Notes (Signed)
Complete Physical Exam      Patient: Jared Mullen, Male    DOB: [birth year], 81 y.o.   MRN: [id number] Visit Date: 11/02/2021  Today's Provider: Lelon Huh, MD   Chief Complaint  Patient presents with   Medicare Wellness   Subjective    HPI  Jared Mullen is a 81 y.o. male who presents today for his complete physical examination. He reports consuming a general diet. The patient does not participate in regular exercise at present. He generally feels fairly well. He reports sleeping well. He does have additional problems to discuss today, patient states that he will have to have dental workup in near future to have fix 6 of his teeth that he states have cavities, patient would like to make sure he is in good health before dental visit.   He is grieving since his wife passed away unexpectedly in October 04, 2023. 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    He also reports that he had persistent cough for the last several weeks which has had before during this time of year. He was prescribed tessalon and atrovent nasal spray which he states has helped somewhat.   Medications: Outpatient Medications Prior to Visit  Medication Sig   benzonatate (TESSALON) 100 MG capsule Take 2 capsules (200 mg total) by mouth every 8 (eight) hours.   ipratropium (ATROVENT) 0.06 % nasal spray Place 2 sprays into both nostrils 4 (four) times daily.   ondansetron (ZOFRAN-ODT) 8 MG disintegrating tablet Take 1 tablet (8 mg total) by mouth every 8 (eight) hours as needed for nausea or vomiting. (Patient not taking: Reported on 11/02/2021)   No facility-administered medications prior to visit.    No Known Allergies  Patient Care Team: Birdie Sons, MD as PCP - General (Family Medicine) Franchot Gallo, MD as Consulting Physician (Urology)  Review of Systems  Respiratory:  Positive for cough.   All other systems reviewed and are negative.      Objective    Vitals: BP 128/75    Pulse 66     Temp 98.4 F (36.9 C) (Oral)    Resp 15    Ht 5' 9.5" (1.765 m)    Wt 165 lb 8 oz (75.1 kg)    SpO2 100%    BMI 24.09 kg/m    Physical Exam  General Appearance:    Well developed, well nourished male. Alert, cooperative, in no acute distress, appears stated age  Head:    Normocephalic, without obvious abnormality, atraumatic  Eyes:    PERRL, conjunctiva/corneas clear, EOM's intact, fundi    benign, both eyes       Ears:    Normal TM's and external ear canals, both ears  Nose:   Nares normal, septum midline. Pale mucosa.   Throat:   Lips, mucosa, and tongue normal; Post nasal drainage noted. teeth and gums normal  Neck:   Supple, symmetrical, trachea midline, no adenopathy;       thyroid:  No enlargement/tenderness/nodules; no carotid   bruit or JVD  Back:     Symmetric, no curvature, ROM normal, no CVA tenderness  Lungs:     Clear to auscultation bilaterally, respirations unlabored  Chest wall:    No tenderness or deformity  Heart:    Normal heart rate. Normal rhythm. No murmurs, rubs, or gallops.  S1 and S2 normal  Abdomen:     Soft, non-tender, bowel sounds active all four quadrants,  no masses, no organomegaly  Genitalia:    deferred  Rectal:    deferred  Extremities:   All extremities are intact. No cyanosis or edema  Skin:   Skin color, texture, turgor normal, no rashes or lesions  Neurologic:   CNII-XII intact. Normal strength, sensation and reflexes      throughout       Assessment & Plan    1. Annual physical exam   2. Grief His wifes passing was unexpected, but he feels he will be better with time and not needing any other treatment or therapy at this time.   3. Prostate cancer screening  - PSA Total (Reflex To Free) ([facility] only)  4. Abnormal weight loss He attributes this to caring for his wife full time for years before her passing.  - TSH - CBC - Comprehensive Metabolic Panel (CMET)  5. H/O adenomatous polyp of colon Last colonoscopy by Dr. Alice Reichert  was 5 years ago and expect [facility] to contact him this year.   6. Post nasal drainage He feels Atrovent nasal spray is helping. He prefers not to add any medications at this time.      The entirety of the information documented in the History of Present Illness, Review of Systems and Physical Exam were personally obtained by me. Portions of this information were initially documented by the CMA and reviewed by me for thoroughness and accuracy.     Jared Huh, MD  [facility] [phone] (phone) [phone] (fax)  [place]

## 2021-11-02 ENCOUNTER — Encounter: Payer: Self-pay | Admitting: Family Medicine

## 2021-11-02 ENCOUNTER — Other Ambulatory Visit: Payer: Self-pay

## 2021-11-02 ENCOUNTER — Ambulatory Visit (INDEPENDENT_AMBULATORY_CARE_PROVIDER_SITE_OTHER): Payer: Medicare Other | Admitting: Family Medicine

## 2021-11-02 VITALS — BP 128/75 | HR 66 | Temp 98.4°F | Resp 15 | Ht 69.5 in | Wt 165.5 lb

## 2021-11-02 DIAGNOSIS — Z0000 Encounter for general adult medical examination without abnormal findings: Secondary | ICD-10-CM | POA: Diagnosis not present

## 2021-11-02 DIAGNOSIS — R634 Abnormal weight loss: Secondary | ICD-10-CM | POA: Diagnosis not present

## 2021-11-02 DIAGNOSIS — R0982 Postnasal drip: Secondary | ICD-10-CM | POA: Diagnosis not present

## 2021-11-02 DIAGNOSIS — Z125 Encounter for screening for malignant neoplasm of prostate: Secondary | ICD-10-CM

## 2021-11-02 DIAGNOSIS — Z86010 Personal history of colonic polyps: Secondary | ICD-10-CM

## 2021-11-02 DIAGNOSIS — F4321 Adjustment disorder with depressed mood: Secondary | ICD-10-CM

## 2021-11-02 NOTE — Patient Instructions (Addendum)
Please review the attached list of medications and notify my office if there are any errors.   The CDC recommends two doses of Shingrix (the shingles vaccine) separated by 2 to 6 months for adults age 83 years and older. I recommend checking with your insurance plan regarding coverage for this vaccine.    You are due for a Tdap (tetanus-diptheria-pertussis vaccine) which protects you from tetanus and whooping cough. Please check with your insurance plan or pharmacy regarding coverage for this vaccine.

## 2021-11-02 NOTE — Progress Notes (Signed)
Annual Wellness Visit     Patient: Jared Mullen, Male    DOB: 09-12-1941, 81 y.o.   MRN: [id number] Visit Date: 11/02/2021  Today's Provider: Lelon Huh, MD   Chief Complaint  Patient presents with   Medicare Wellness   Subjective    Jared Mullen is a 81 y.o. male who presents today for his Annual Wellness Visit.  Medications: Outpatient Medications Prior to Visit  Medication Sig   benzonatate (TESSALON) 100 MG capsule Take 2 capsules (200 mg total) by mouth every 8 (eight) hours.   ipratropium (ATROVENT) 0.06 % nasal spray Place 2 sprays into both nostrils 4 (four) times daily.   [DISCONTINUED] ondansetron (ZOFRAN-ODT) 8 MG disintegrating tablet Take 1 tablet (8 mg total) by mouth every 8 (eight) hours as needed for nausea or vomiting. (Patient not taking: Reported on 11/02/2021)   No facility-administered medications prior to visit.    No Known Allergies  Patient Care Team: Birdie Sons, MD as PCP - General (Family Medicine) Franchot Gallo, MD as Consulting Physician (Urology)        Objective     Most recent functional status assessment: In your present state of health, do you have any difficulty performing the following activities: 11/02/2021  Hearing? N  Vision? N  Difficulty concentrating or making decisions? N  Walking or climbing stairs? N  Dressing or bathing? N  Doing errands, shopping? N  Some recent data might be hidden   Most recent fall risk assessment: Fall Risk  11/02/2021  Falls in the past year? 0  Number falls in past yr: 0  Injury with Fall? 0  Follow up -    Most recent depression screenings: PHQ 2/9 Scores 11/02/2021 10/31/2020  PHQ - 2 Score 0 0  PHQ- 9 Score 0 0   Most recent cognitive screening: 6CIT Screen 04/22/2017  What Year? 0 points  What month? 0 points  What time? 0 points  Count back from 20 0 points  Months in reverse 0 points  Repeat phrase 0 points  Total Score 0   Most recent Audit-C alcohol use  screening Alcohol Use Disorder Test (AUDIT) 11/02/2021  1. How often do you have a drink containing alcohol? 0  2. How many drinks containing alcohol do you have on a typical day when you are drinking? 0  3. How often do you have six or more drinks on one occasion? 0  AUDIT-C Score 0  Alcohol Brief Interventions/Follow-up -   A score of 3 or more in women, and 4 or more in men indicates increased risk for alcohol abuse, EXCEPT if all of the points are from question 1   No results found for any visits on 11/02/21.  Assessment & Plan     Annual wellness visit done today including the all of the following: Reviewed patient's Family Medical History Reviewed and updated list of patient's medical providers Assessment of cognitive impairment was done Assessed patient's functional ability Established a written schedule for health screening [place] Completed and Reviewed  Exercise Activities and Dietary recommendations  Goals      Increase water intake     Recommend increasing water intake to 4-6 glasses a day.         Immunization History  Administered Date(s) Administered   Fluad Quad(high Dose 65+) 10/31/2020   Influenza, High Dose Seasonal PF 09/29/2016   Pneumococcal Conjugate-13 02/27/2014   Pneumococcal Polysaccharide-23 08/23/2011    Health Maintenance  Topic Date Due   COVID-19 Vaccine (1) Never done   Zoster Vaccines- Shingrix (1 of 2) Never done   INFLUENZA VACCINE  05/18/2021   TETANUS/TDAP  10/18/2026 (Originally 12/17/1959)   COLONOSCOPY (Pts 45-57yrs Insurance coverage will need to be confirmed)  01/10/2022   DEXA SCAN  12/15/2025   Pneumonia Vaccine 70+ Years old  Completed   HPV VACCINES  Aged Out     Discussed health benefits of physical activity, and encouraged him to engage in regular exercise appropriate for his age and condition.     He declined flu vaccine today .     The entirety of the information documented in the History  of Present Illness, Review of Systems and Physical Exam were personally obtained by me. Portions of this information were initially documented by the CMA and reviewed by me for thoroughness and accuracy.     Lelon Huh, MD  [facility] [phone] (phone) [phone] (fax)  [place]

## 2021-11-03 LAB — COMPREHENSIVE METABOLIC PANEL
ALT: 15 IU/L (ref 0–44)
AST: 19 IU/L (ref 0–40)
Albumin/Globulin Ratio: 1.2 (ref 1.2–2.2)
Albumin: 3.8 g/dL (ref 3.6–4.6)
Alkaline Phosphatase: 80 IU/L (ref 44–121)
BUN/Creatinine Ratio: 19 (ref 10–24)
BUN: 17 mg/dL (ref 8–27)
Bilirubin Total: 0.6 mg/dL (ref 0.0–1.2)
CO2: 28 mmol/L (ref 20–29)
Calcium: 10 mg/dL (ref 8.6–10.2)
Chloride: 102 mmol/L (ref 96–106)
Creatinine, Ser: 0.89 mg/dL (ref 0.76–1.27)
Globulin, Total: 3.2 g/dL (ref 1.5–4.5)
Glucose: 102 mg/dL — ABNORMAL HIGH (ref 70–99)
Potassium: 5.1 mmol/L (ref 3.5–5.2)
Sodium: 141 mmol/L (ref 134–144)
Total Protein: 7 g/dL (ref 6.0–8.5)
eGFR: 86 mL/min/{1.73_m2} (ref >59)

## 2021-11-03 LAB — CBC
Hematocrit: 44.7 % (ref 37.5–51.0)
Hemoglobin: 15.1 g/dL (ref 13.0–17.7)
MCH: 30.4 pg (ref 26.6–33.0)
MCHC: 33.8 g/dL (ref 31.5–35.7)
MCV: 90 fL (ref 79–97)
Platelets: 222 10*3/uL (ref 150–450)
RBC: 4.96 x10E6/uL (ref 4.14–5.80)
RDW: 12.9 % (ref 11.6–15.4)
WBC: 7.3 10*3/uL (ref 3.4–10.8)

## 2021-11-03 LAB — PSA TOTAL (REFLEX TO FREE): Prostate Specific Ag, Serum: <0.1 ng/mL (ref 0.0–4.0)

## 2021-11-03 LAB — TSH: TSH: 3.95 u[IU]/mL (ref 0.450–4.500)

## 2021-11-09 ENCOUNTER — Emergency Department: Payer: Medicare Other

## 2021-11-09 ENCOUNTER — Other Ambulatory Visit: Payer: Self-pay

## 2021-11-09 ENCOUNTER — Inpatient Hospital Stay
Admission: EM | Admit: 2021-11-09 | Discharge: 2021-11-17 | DRG: 180 | Disposition: A | Discharge status: Home Health | Payer: Medicare Other | Attending: Internal Medicine | Admitting: Internal Medicine

## 2021-11-09 DIAGNOSIS — Z8249 Family history of ischemic heart disease and other diseases of the circulatory system: Secondary | ICD-10-CM

## 2021-11-09 DIAGNOSIS — Z87891 Personal history of nicotine dependence: Secondary | ICD-10-CM

## 2021-11-09 DIAGNOSIS — E871 Hypo-osmolality and hyponatremia: Secondary | ICD-10-CM | POA: Diagnosis present

## 2021-11-09 DIAGNOSIS — Z681 Body mass index (BMI) 19 or less, adult: Secondary | ICD-10-CM

## 2021-11-09 DIAGNOSIS — K5900 Constipation, unspecified: Secondary | ICD-10-CM

## 2021-11-09 DIAGNOSIS — Z923 Personal history of irradiation: Secondary | ICD-10-CM

## 2021-11-09 DIAGNOSIS — R109 Unspecified abdominal pain: Secondary | ICD-10-CM | POA: Diagnosis present

## 2021-11-09 DIAGNOSIS — Z841 Family history of disorders of kidney and ureter: Secondary | ICD-10-CM

## 2021-11-09 DIAGNOSIS — Z20822 Contact with and (suspected) exposure to covid-19: Secondary | ICD-10-CM | POA: Diagnosis present

## 2021-11-09 DIAGNOSIS — C3412 Malignant neoplasm of upper lobe, left bronchus or lung: Secondary | ICD-10-CM | POA: Diagnosis not present

## 2021-11-09 DIAGNOSIS — R778 Other specified abnormalities of plasma proteins: Secondary | ICD-10-CM

## 2021-11-09 DIAGNOSIS — R1310 Dysphagia, unspecified: Secondary | ICD-10-CM | POA: Diagnosis present

## 2021-11-09 DIAGNOSIS — Z8546 Personal history of malignant neoplasm of prostate: Secondary | ICD-10-CM

## 2021-11-09 DIAGNOSIS — G4700 Insomnia, unspecified: Secondary | ICD-10-CM | POA: Diagnosis present

## 2021-11-09 DIAGNOSIS — Z79899 Other long term (current) drug therapy: Secondary | ICD-10-CM

## 2021-11-09 DIAGNOSIS — Z515 Encounter for palliative care: Secondary | ICD-10-CM

## 2021-11-09 DIAGNOSIS — R110 Nausea: Secondary | ICD-10-CM

## 2021-11-09 DIAGNOSIS — R0902 Hypoxemia: Secondary | ICD-10-CM | POA: Diagnosis present

## 2021-11-09 DIAGNOSIS — Z7189 Other specified counseling: Secondary | ICD-10-CM

## 2021-11-09 DIAGNOSIS — K56609 Unspecified intestinal obstruction, unspecified as to partial versus complete obstruction: Secondary | ICD-10-CM

## 2021-11-09 DIAGNOSIS — G4730 Sleep apnea, unspecified: Secondary | ICD-10-CM | POA: Diagnosis present

## 2021-11-09 DIAGNOSIS — C771 Secondary and unspecified malignant neoplasm of intrathoracic lymph nodes: Secondary | ICD-10-CM | POA: Diagnosis present

## 2021-11-09 DIAGNOSIS — Z9049 Acquired absence of other specified parts of digestive tract: Secondary | ICD-10-CM

## 2021-11-09 DIAGNOSIS — I248 Other forms of acute ischemic heart disease: Secondary | ICD-10-CM | POA: Diagnosis present

## 2021-11-09 DIAGNOSIS — I214 Non-ST elevation (NSTEMI) myocardial infarction: Secondary | ICD-10-CM

## 2021-11-09 DIAGNOSIS — Z7951 Long term (current) use of inhaled steroids: Secondary | ICD-10-CM

## 2021-11-09 DIAGNOSIS — Z801 Family history of malignant neoplasm of trachea, bronchus and lung: Secondary | ICD-10-CM

## 2021-11-09 DIAGNOSIS — Z803 Family history of malignant neoplasm of breast: Secondary | ICD-10-CM

## 2021-11-09 DIAGNOSIS — K219 Gastro-esophageal reflux disease without esophagitis: Secondary | ICD-10-CM | POA: Diagnosis present

## 2021-11-09 DIAGNOSIS — C61 Malignant neoplasm of prostate: Secondary | ICD-10-CM

## 2021-11-09 DIAGNOSIS — K7689 Other specified diseases of liver: Secondary | ICD-10-CM | POA: Diagnosis present

## 2021-11-09 DIAGNOSIS — F4320 Adjustment disorder, unspecified: Secondary | ICD-10-CM | POA: Diagnosis present

## 2021-11-09 DIAGNOSIS — R918 Other nonspecific abnormal finding of lung field: Secondary | ICD-10-CM | POA: Diagnosis present

## 2021-11-09 DIAGNOSIS — Z9889 Other specified postprocedural states: Secondary | ICD-10-CM

## 2021-11-09 DIAGNOSIS — Z8711 Personal history of peptic ulcer disease: Secondary | ICD-10-CM

## 2021-11-09 DIAGNOSIS — E43 Unspecified severe protein-calorie malnutrition: Secondary | ICD-10-CM

## 2021-11-09 DIAGNOSIS — R509 Fever, unspecified: Secondary | ICD-10-CM | POA: Diagnosis not present

## 2021-11-09 HISTORY — DX: Abnormal electrocardiogram (ECG) (EKG): R94.31

## 2021-11-09 HISTORY — DX: Abnormal weight loss: R63.4

## 2021-11-09 LAB — CBC WITH DIFFERENTIAL/PLATELET
Abs Immature Granulocytes: 0.02 10*3/uL (ref 0.00–0.07)
Basophils Absolute: 0 10*3/uL (ref 0.0–0.1)
Basophils Relative: 0 %
Eosinophils Absolute: 0.1 10*3/uL (ref 0.0–0.5)
Eosinophils Relative: 1 %
HCT: 42 % (ref 39.0–52.0)
Hemoglobin: 14.1 g/dL (ref 13.0–17.0)
Immature Granulocytes: 0 %
Lymphocytes Relative: 12 %
Lymphs Abs: 0.9 10*3/uL (ref 0.7–4.0)
MCH: 29.7 pg (ref 26.0–34.0)
MCHC: 33.6 g/dL (ref 30.0–36.0)
MCV: 88.6 fL (ref 80.0–100.0)
Monocytes Absolute: 1 10*3/uL (ref 0.1–1.0)
Monocytes Relative: 13 %
Neutro Abs: 5.6 10*3/uL (ref 1.7–7.7)
Neutrophils Relative %: 74 %
Platelets: 215 10*3/uL (ref 150–400)
RBC: 4.74 MIL/uL (ref 4.22–5.81)
RDW: 13.6 % (ref 11.5–15.5)
WBC: 7.7 10*3/uL (ref 4.0–10.5)
nRBC: 0 % (ref 0.0–0.2)

## 2021-11-09 LAB — RESP PANEL BY RT-PCR (FLU A&B, COVID) ARPGX2
Influenza A by PCR: NEGATIVE
Influenza B by PCR: NEGATIVE
SARS Coronavirus 2 by RT PCR: NEGATIVE

## 2021-11-09 LAB — COMPREHENSIVE METABOLIC PANEL
ALT: 13 U/L (ref 0–44)
AST: 17 U/L (ref 15–41)
Albumin: 3.5 g/dL (ref 3.5–5.0)
Alkaline Phosphatase: 71 U/L (ref 38–126)
Anion gap: 7 (ref 5–15)
BUN: 16 mg/dL (ref 8–23)
CO2: 29 mmol/L (ref 22–32)
Calcium: 9.4 mg/dL (ref 8.9–10.3)
Chloride: 96 mmol/L — ABNORMAL LOW (ref 98–111)
Creatinine, Ser: 0.72 mg/dL (ref 0.61–1.24)
GFR, Estimated: >60 mL/min (ref >60)
Glucose, Bld: 122 mg/dL — ABNORMAL HIGH (ref 70–99)
Potassium: 4.5 mmol/L (ref 3.5–5.1)
Sodium: 132 mmol/L — ABNORMAL LOW (ref 135–145)
Total Bilirubin: 1.2 mg/dL (ref 0.3–1.2)
Total Protein: 7.4 g/dL (ref 6.5–8.1)

## 2021-11-09 LAB — TROPONIN I (HIGH SENSITIVITY)
Troponin I (High Sensitivity): 410 ng/L (ref <18)
Troponin I (High Sensitivity): 491 ng/L (ref <18)

## 2021-11-09 LAB — LIPID PANEL
Cholesterol: 134 mg/dL (ref 0–200)
HDL: 41 mg/dL (ref >40)
LDL Cholesterol: 86 mg/dL (ref 0–99)
Total CHOL/HDL Ratio: 3.3 RATIO
Triglycerides: 34 mg/dL (ref <150)
VLDL: 7 mg/dL (ref 0–40)

## 2021-11-09 LAB — PROCALCITONIN: Procalcitonin: 0.1 ng/mL

## 2021-11-09 LAB — TSH: TSH: 1.767 u[IU]/mL (ref 0.350–4.500)

## 2021-11-09 LAB — LIPASE, BLOOD: Lipase: 26 U/L (ref 11–51)

## 2021-11-09 MED ORDER — ACETAMINOPHEN 650 MG RE SUPP: 650.0000 mg | Freq: Four times a day (QID) | RECTAL | Status: DC | PRN

## 2021-11-09 MED ORDER — ASPIRIN 81 MG PO CHEW
324.0000 mg | CHEWABLE_TABLET | Freq: Once | ORAL | Status: AC
  Administered 2021-11-09: 324 mg via ORAL
  Filled 2021-11-09: qty 4

## 2021-11-09 MED ORDER — HYDRALAZINE HCL 20 MG/ML IJ SOLN: 5.0000 mg | Freq: Four times a day (QID) | INTRAMUSCULAR | Status: DC | PRN

## 2021-11-09 MED ORDER — LIDOCAINE VISCOUS HCL 2 % MT SOLN
15.0000 mL | Freq: Once | OROMUCOSAL | Status: AC
Start: 2021-11-09 — End: 2021-11-09
  Administered 2021-11-09: 15 mL via ORAL
  Filled 2021-11-09: qty 15

## 2021-11-09 MED ORDER — ONDANSETRON HCL 4 MG/2ML IJ SOLN
4.0000 mg | Freq: Once | INTRAMUSCULAR | Status: AC
  Administered 2021-11-09: 4 mg via INTRAVENOUS
  Filled 2021-11-09: qty 2

## 2021-11-09 MED ORDER — ALUM & MAG HYDROXIDE-SIMETH 200-200-20 MG/5ML PO SUSP
15.0000 mL | Freq: Once | ORAL | Status: AC
Start: 2021-11-09 — End: 2021-11-09
  Administered 2021-11-09: 15 mL via ORAL
  Filled 2021-11-09: qty 30

## 2021-11-09 MED ORDER — LACTATED RINGERS IV BOLUS
1000.0000 mL | Freq: Once | INTRAVENOUS | Status: AC
Start: 2021-11-09 — End: 2021-11-09
  Administered 2021-11-09: 1000 mL via INTRAVENOUS

## 2021-11-09 MED ORDER — MORPHINE SULFATE (PF) 2 MG/ML IV SOLN
1.0000 mg | Freq: Four times a day (QID) | INTRAVENOUS | Status: DC | PRN
Start: 2021-11-09 — End: 2021-11-17
  Administered 2021-11-11 – 2021-11-14 (×4): 1 mg via INTRAVENOUS
  Filled 2021-11-09 (×4): qty 1

## 2021-11-09 MED ORDER — MELATONIN 5 MG PO TABS
5.0000 mg | ORAL_TABLET | Freq: Every day | ORAL | Status: DC
  Administered 2021-11-09 – 2021-11-16 (×8): 5 mg via ORAL
  Filled 2021-11-09 (×8): qty 1

## 2021-11-09 MED ORDER — IOHEXOL 350 MG/ML SOLN
75.0000 mL | Freq: Once | INTRAVENOUS | Status: AC | PRN
  Administered 2021-11-09: 75 mL via INTRAVENOUS

## 2021-11-09 MED ORDER — SODIUM CHLORIDE 0.9 % IV SOLN: INTRAVENOUS | Status: AC

## 2021-11-09 MED ORDER — ACETAMINOPHEN 325 MG PO TABS
650.0000 mg | ORAL_TABLET | Freq: Four times a day (QID) | ORAL | Status: DC | PRN
  Administered 2021-11-10: 16:00:00 650 mg via ORAL
  Filled 2021-11-09: qty 2

## 2021-11-09 MED ORDER — PANTOPRAZOLE SODIUM 40 MG IV SOLR
40.0000 mg | Freq: Two times a day (BID) | INTRAVENOUS | Status: DC
  Administered 2021-11-09 – 2021-11-13 (×7): 40 mg via INTRAVENOUS
  Filled 2021-11-09 (×7): qty 40

## 2021-11-09 MED ORDER — NITROGLYCERIN 0.4 MG SL SUBL: 0.4000 mg | SUBLINGUAL_TABLET | SUBLINGUAL | Status: DC | PRN

## 2021-11-09 MED ORDER — HEPARIN (PORCINE) 25000 UT/250ML-% IV SOLN
1250.0000 [IU]/h | INTRAVENOUS | Status: DC
  Administered 2021-11-09: 19:00:00 750 [IU]/h via INTRAVENOUS
  Administered 2021-11-10: 20:00:00 1100 [IU]/h via INTRAVENOUS
  Filled 2021-11-09 (×2): qty 250

## 2021-11-09 MED ORDER — HEPARIN BOLUS VIA INFUSION
3000.0000 [IU] | Freq: Once | INTRAVENOUS | Status: AC
  Administered 2021-11-09: 3000 [IU] via INTRAVENOUS
  Filled 2021-11-09: qty 3000

## 2021-11-09 NOTE — ED Notes (Signed)
Patient to CT scan at this time

## 2021-11-09 NOTE — ED Notes (Signed)
Patient refusing lab draw at this time, states he just had blood drawn at PCP last week. Wants to be evaluated by MD before allowing RN to draw any additional labs. Family member at bedside.

## 2021-11-09 NOTE — Consult Note (Signed)
ANTICOAGULATION CONSULT NOTE   Pharmacy Consult for heparin Indication: chest pain/ACS  No Known Allergies  Patient Measurements: Height: 5\' 9"  (175.3 cm) Weight: 61.2 kg (135 lb) IBW/kg (Calculated) : 70.7 Heparin Dosing Weight: 61.2 kg   Vital Signs: Temp: 99.5 F (37.5 C) (01/23 1138) BP: 98/56 (01/23 1630) Pulse Rate: 73 (01/23 1630)  Labs: Recent Labs    11/09/21 1536  HGB 14.1  HCT 42.0  PLT 215  CREATININE 0.72  TROPONINIHS 410*    Estimated Creatinine Clearance: 61.6 mL/min (by C-G formula based on SCr of 0.72 mg/dL).   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 Elevated trop. No DOAC PTA.   Goal of Therapy:  Heparin level 0.3-0.7 units/ml Monitor platelets by anticoagulation protocol: Yes   Plan:  Give 3000 units bolus x 1 Start heparin infusion at 750 units/hr Check anti-Xa level in 8 hours and daily while on heparin Continue to monitor H&H and platelets  Oswald Hillock, PharmD, BCPS 11/09/2021,5:27 PM

## 2021-11-09 NOTE — H&P (Signed)
History and Physical    Jared Mullen EZM:[id number] DOB: May 29, 1941 DOA: 11/09/2021  PCP: Birdie Sons, MD    Patient coming from:  Home    Chief Complaint:  Abdominal pain and nausea x 2 months   HPI:  Jared Mullen is a 81 y.o. male seen in ed with complaints of abdominal pain and nausea since 2 months.Coughing chronically.family at bedside.  Pt has had weight loss of about 20 lbs since 4 months. Loss of appetite and nausea.  Abdominal  pain: Generalized / 10/10 Few months. Dull pain/ NR Eating makes pain worse. No relieving factors.  BM - constipation.  No bleeding.  No vomitting.   Early satiety, nausea, Ros is o/w negative.   Pt has past medical history of ESRD.  ED Course:   Vitals:   11/09/21 1832 11/09/21 2019 11/09/21 2030 11/09/21 2110  BP: 124/76 118/71 117/74 136/87  Pulse: 83 79 79 77  Resp: 18 16 (!) 22 20  Temp:    98.4 F (36.9 C)  TempSrc:    Oral  SpO2: 98% 95% 96% 98%  Weight:      Height:      In ed pt is alert/ a/ oriented and afebrile. Oxygenating well since oxygen is started.  Labs shows mils hyponatremia and glucose of 122 and elevated troponin of  410 and 491. Resp panel negative for flu and covid.  CTA chest : 1. No pulmonary embolus. 2. Left perihilar upper lobe masslike opacity measuring 6.6 x 5.0 x 6.1 cm, contiguous with the hilum. Margins are irregular and spiculated with linear extension to the periphery of the left upper lobe. Findings are highly suspicious for primary bronchogenic malignancy. 3. Perihilar mass is contiguous with the hilum, however there appears to be a discrete left hilar adenopathy. Prominent left lower paratracheal nodes, and a nonspecific 9 mm right hilar node. 4. Separate 3 mm left upper lobe nodule. Irregular opacity at the left lung apex appears triangular measuring 11 mm, nonspecific but may be scarring. 5. Severe narrowing of the left upper lobe bronchus due to left perihilar mass. 6. Innumerable  low-density lesions within the liver, largest lesions appear to be simple fluid density. These were previously characterized as hepatic cysts on MRI, although not well assessed on the current exam due to phase of contrast. 7. 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 Aortic Atherosclerosis (ICD10-I70.0). In ed pt was given zofran, LR, heparin.    Review of Systems:  Review of Systems  Constitutional: Negative.   HENT: Negative.    Eyes: Negative.   Respiratory:  Positive for cough.   Cardiovascular: Negative.   Gastrointestinal:  Positive for abdominal pain, constipation and nausea. Negative for blood in stool, diarrhea, heartburn, melena and vomiting.  Genitourinary: Negative.   Musculoskeletal: Negative.   Skin: Negative.   Neurological: Negative.  Negative for dizziness, tingling, sensory change and headaches.  Endo/Heme/Allergies: Negative.   Psychiatric/Behavioral: Negative.     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 His smoking use included cigarettes. He has a 56.00 pack-year smoking history. He has never used smokeless tobacco. He reports that he does not drink alcohol and does not use drugs.  No Known Allergies  Family History   Problem Relation Age of Onset   Heart disease Father    Breast cancer Sister    Cancer Sister        lung   Lung cancer Brother    Cancer Brother        bladder   Lung cancer Sister    Cancer Sister        breast   Kidney disease Brother     Prior to Admission medications   Medication Sig Start Date End Date Taking? Authorizing Provider  benzonatate (TESSALON) 100 MG capsule Take 2 capsules (200 mg total) by mouth every 8 (eight) hours. 10/16/21   Margarette Canada, NP  ipratropium (ATROVENT) 0.06 % nasal spray Place 2 sprays into both nostrils 4 (four) times daily. 10/16/21   Margarette Canada, NP    Physical Exam: Vitals:   11/09/21 1832 11/09/21 2019 11/09/21 2030 11/09/21 2110  BP: 124/76 118/71 117/74 136/87  Pulse: 83 79 79 77  Resp: 18 16 (!) 22 20  Temp:    98.4 F (36.9 C)  TempSrc:    Oral  SpO2: 98% 95% 96% 98%  Weight:      Height:       Physical Exam Vitals reviewed.  Constitutional:      Appearance: Normal appearance. He is not ill-appearing.  HENT:     Head: Normocephalic and atraumatic.     Right Ear: External ear normal.     Left Ear: External ear normal.     Nose: Nose normal. No rhinorrhea.     Mouth/Throat:     Mouth: Mucous membranes are moist.  Eyes:     Extraocular Movements: Extraocular movements intact.     Pupils: Pupils are equal, round, and reactive to light.  Neck:     Vascular: No carotid bruit.  Cardiovascular:     Rate and Rhythm: Normal rate and regular rhythm.  Pulmonary:     Effort: Pulmonary effort is normal.     Breath sounds: Rhonchi present. No wheezing.  Abdominal:     General: Bowel sounds are normal.     Palpations: Abdomen is soft.  Musculoskeletal:        General: Normal range of motion.  Skin:    General: Skin is warm.  Neurological:     General: No focal deficit present.     Mental Status: He is alert.     Labs on Admission: I have personally reviewed following labs and imaging studies No results for input(s):  CKTOTAL, CKMB, TROPONINI in the last 72 hours. Lab Results  Component Value Date   WBC 7.7 11/09/2021   HGB 14.1 11/09/2021   HCT 42.0 11/09/2021   MCV 88.6 11/09/2021   PLT 215 11/09/2021    Recent Labs  Lab 11/09/21 1536  NA 132*  K 4.5  CL 96*  CO2 29  BUN 16  CREATININE 0.72  CALCIUM 9.4  PROT 7.4  BILITOT 1.2  ALKPHOS 71  ALT 13  AST 17  GLUCOSE 122*   Lab Results  Component Value Date   CHOL 134 11/09/2021   HDL 41 11/09/2021   LDLCALC 86 11/09/2021   TRIG 34 11/09/2021   No results found for: DDIMER Invalid input(s): POCBNP   COVID-19 Labs No results for input(s): DDIMER, FERRITIN, LDH, CRP in the last 72 hours. Lab Results  Component Value Date   [place] NEGATIVE 11/09/2021    Radiological Exams on Admission: DG Chest 2 View  Result Date: 11/09/2021 CLINICAL DATA:  Nausea, belching decreased appetite for 2 months, history prostate cancer EXAM: CHEST - 2 VIEW COMPARISON:  03/07/2017 rib radiographs, chest radiograph 11/16/2016 FINDINGS: Normal heart size, mediastinal contours, and pulmonary vascularity. Small calcifications in the chest bilaterally again identified likely calcified pleural plaques. New LEFT perihilar mass 6.6 x 6.4 cm highly suspicious for pulmonary neoplasm. No acute infiltrate, pleural effusion or pneumothorax. No definite acute bone lesions. IMPRESSION: New LEFT perihilar mass 6.6 x 6.4 cm highly suspicious for a pulmonary neoplasm; CT chest with contrast recommended for further evaluation. Calcifications in the chest bilaterally most consistent with calcified pleural plaques, unchanged, question prior asbestos exposure. Aortic Atherosclerosis (ICD10-I70.0). Electronically Signed   By: Lavonia Dana M.D.   On: 11/09/2021 17:09   CT Angio Chest PE W/Cm &/Or Wo Cm  Result Date: 11/09/2021 CLINICAL DATA:  Pulmonary embolism (PE) suspected, high prob Two month history of nausea and decreased appetite. Symptoms after patient's wife done 2  months ago. EXAM: CT ANGIOGRAPHY CHEST WITH CONTRAST TECHNIQUE: Multidetector CT imaging of the chest was performed using the standard protocol during bolus administration of intravenous contrast. Multiplanar CT image reconstructions and MIPs were obtained to evaluate the vascular anatomy. RADIATION DOSE REDUCTION: This exam was performed according to the departmental dose-optimization program which includes automated exposure control, adjustment of the mA and/or kV according to patient size and/or use of iterative reconstruction technique. CONTRAST:  62mL OMNIPAQUE IOHEXOL 350 MG/ML SOLN COMPARISON:  Chest radiograph earlier today. FINDINGS: Cardiovascular: There are no filling defects within the pulmonary arteries to suggest pulmonary embolus. The lingular pulmonary arteries are attenuated due to left perihilar mass. Atherosclerosis of the thoracic aorta. No aortic aneurysm or acute aortic findings. The heart is normal in size. There is no pericardial effusion. Mediastinum/Nodes: Left upper lobe perihilar mass is contiguous with the hilum. There is likely separate suprahilar adenopathy within 18 mm lymph node series 5, image 46. Likely infrahilar lymph node measuring 2.4 cm series 5, image 58. 9 mm right hilar node. Occasional prominent left lower paratracheal nodes, including a 10 mm node series 5, image 38. No definite supraclavicular adenopathy. No visualized thyroid nodule. Upper esophagus is patulous. No esophageal wall thickening. Lungs/Pleura: Left perihilar upper lobe masslike opacity. This measures 6.6 x 5.0 x 6.1 cm, measured on series 7, image 46 and series 8, image 55. Margins are irregular and spiculated with linear extension to the periphery of the pleural surface of the left upper lobe. There are small adjacent satellite nodules. Separate left upper lobe 3 mm nodule series 7, image 32. Irregular opacity at the left lung apex which appears triangular measuring 11 mm, series 7, image 21. Biapical  pleuroparenchymal scarring, some of which is calcified. Bilateral calcified pleural plaques. Multiple calcified granuloma in the right lower lobe. Severe narrowing of the left upper lobe bronchus due to in left perihilar mass. No pleural effusion. Upper Abdomen: Innumerable low-density lesions within the liver, largest lesions appear  to be simple fluid density. Previous abdominal MRI 06/22/2019 demonstrated multiple hepatic cysts. No discrete adrenal nodule. Musculoskeletal: Slight flattening of mid thoracic vertebra. No destructive lytic or focal blastic osseous lesion. Review of the MIP images confirms the above findings. IMPRESSION: 1. No pulmonary embolus. 2. Left perihilar upper lobe masslike opacity measuring 6.6 x 5.0 x 6.1 cm, contiguous with the hilum. Margins are irregular and spiculated with linear extension to the periphery of the left upper lobe. Findings are highly suspicious for primary bronchogenic malignancy. 3. Perihilar mass is contiguous with the hilum, however there appears to be a discrete left hilar adenopathy. Prominent left lower paratracheal nodes, and a nonspecific 9 mm right hilar node. 4. Separate 3 mm left upper lobe nodule. Irregular opacity at the left lung apex appears triangular measuring 11 mm, nonspecific but may be scarring. 5. Severe narrowing of the left upper lobe bronchus due to left perihilar mass. 6. Innumerable low-density lesions within the liver, largest lesions appear to be simple fluid density. These were previously characterized as hepatic cysts on MRI, although not well assessed on the current exam due to phase of contrast. 7. 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 Aortic Atherosclerosis (ICD10-I70.0). Electronically Signed   By: Keith Rake M.D.   On: 11/09/2021 18:02    EKG: Independently reviewed.  None    Assessment/Plan: Principal Problem:   NSTEMI (non-ST elevated myocardial infarction) [facility]) Active Problems:    Abdominal pain   Lung mass   NSTEMI: We will admit to telemetry. Cont asa/ heparin and statin Supplemental oxygen as needed.   Abdominal pain: Attribute to GERD/ PUD. Iv ppi therapy[y./  Lung Mass: Outpatient appt with pulmo and and oncology.    DVT prophylaxis:  SCD's   Code Status:  Full code     Family Communication:  Bye,Tracey (Daughter)  [phone] (Mobile)   Disposition Plan:  Home    Consults called:  None   Admission status: Inpatient.    Medical Decision Making   Coding    Para Skeans MD [facility]  6 PM- 2 AM. Please contact me via secure Chat 6 PM-2 AM. To contact the [facility] Attending or Consulting provider [place] or covering provider during after hours [place], for this patient.   Check the care team in [facility] and look for a) attending/consulting [facility] provider listed and b) the [facility] team listed Log into [email or web address] and use [place]'s universal password to access. If you do not have the password, please contact the hospital operator. Locate the [facility] provider you are looking for under [facility] and page to a number that you can be directly reached. If you still have difficulty reaching the provider, please page the [facility] (Director on Call) for the Hospitalists listed on amion for assistance. [email or web address] 11/09/2021, 9:17 PM

## 2021-11-09 NOTE — ED Triage Notes (Signed)
First Nurse Note:  Arrives with c/o 2 month history of nausea, decreased appetite.  Patient's daughter reports that symptoms started after patient's wife died 2 months ago.  Patient is AAOx3.  Skin warm and dry. NAD

## 2021-11-09 NOTE — ED Notes (Signed)
Message sent to pharmacy to send heparin gtt due to pixis being out of stock.

## 2021-11-09 NOTE — ED Provider Notes (Signed)
[facility] Provider Note    Event Date/Time   First MD Initiated Contact with Patient 11/09/21 1502     (approximate)   History   Chief Complaint Nausea   HPI  Jared Mullen is a 81 y.o. male with past medical history of prostate cancer and psoriasis who presents to the ED complaining of nausea.  Patient reports that his wife passed away about 2 months ago and ever since then he has been dealing with frequent nausea and very poor appetite.  He states that he will dry heave at times but has never vomited.  He feels very bloated and full whenever he attempts to eat anything, daughter states he will only be able to get through about half a sandwich before having to stop.  He states the pressure and discomfort while eating starts in his epigastrium and can move up into his chest.  He states that it sometimes feels like things get stuck in his chest when he tries to eat.  He is often woken from sleep with this discomfort and states he has been sleeping in a recliner with partial relief.  He denies any history of GERD and does not take any medications for gastritis or reflux currently.  He denies any fevers, cough, shortness of breath, pain or swelling in his legs.  He denies any dysuria, hematuria, or flank pain.     Physical Exam   Triage Vital Signs: ED Triage Vitals  Enc Vitals Group     BP 11/09/21 1138 (!) 110/98     Pulse Rate 11/09/21 1138 92     Resp 11/09/21 1138 18     Temp 11/09/21 1138 99.5 F (37.5 C)     Temp src --      SpO2 11/09/21 1138 100 %     Weight 11/09/21 1139 135 lb (61.2 kg)     Height 11/09/21 1139 5\' 9"  (1.753 m)     Head Circumference --      Peak Flow --      Pain Score 11/09/21 1138 0     Pain Loc --      Pain Edu? --      Excl. in [place]? --     Most recent vital signs: Vitals:   11/09/21 1630 11/09/21 1730  BP: (!) 98/56 135/76  Pulse: 73 82  Resp: 20 20  Temp:    SpO2: 99% 99%    Constitutional: Alert and  oriented. Eyes: Conjunctivae are normal. Head: Atraumatic. Nose: No congestion/rhinnorhea. Mouth/Throat: Mucous membranes are moist.  Cardiovascular: Normal rate, regular rhythm. Grossly normal heart sounds.  2+ radial pulses bilaterally. Respiratory: Normal respiratory effort.  No retractions. Lungs CTAB.  No chest wall tenderness to palpation. Gastrointestinal: Soft and nontender. No distention. Musculoskeletal: No lower extremity tenderness nor edema.  Neurologic:  Normal speech and language. No gross focal neurologic deficits are appreciated.    ED Results / Procedures / Treatments   Labs (all labs ordered are listed, but only abnormal results are displayed) Labs Reviewed  COMPREHENSIVE METABOLIC PANEL - Abnormal; Notable for the following components:      Result Value   Sodium 132 (*)    Chloride 96 (*)    Glucose, Bld 122 (*)    All other components within normal limits  TROPONIN I (HIGH SENSITIVITY) - Abnormal; Notable for the following components:   Troponin I (High Sensitivity) 410 (*)    All other components within normal limits  RESP PANEL  BY RT-PCR (FLU A&B, COVID) ARPGX2  CBC WITH DIFFERENTIAL/PLATELET  LIPASE, BLOOD  HEPARIN LEVEL (UNFRACTIONATED)  CBC  TROPONIN I (HIGH SENSITIVITY)     EKG  ED ECG REPORT I, Blake Divine, the attending physician, personally viewed and interpreted this ECG.   Date: 11/09/2021  EKG Time: 15:49  Rate: 76  Rhythm: normal sinus rhythm  Axis: Normal  Intervals:none  ST&T Change: Borderline ST elevation inferolaterally  ED ECG REPORT I, Blake Divine, the attending physician, personally viewed and interpreted this ECG.   Date: 11/09/2021  EKG Time: 16:57  Rate: 79  Rhythm: normal sinus rhythm  Axis: Normal  Intervals:none  ST&T Change: Borderline inferolateral ST elevation, similar to previous   RADIOLOGY Chest x-ray reviewed by me and is concerning for left perihilar mass, no infiltrate or edema  noted.  PROCEDURES:  Critical Care performed: Yes, see critical care procedure note(s)  .Critical Care Performed by: Blake Divine, MD Authorized by: Blake Divine, MD   Critical care provider statement:    Critical care time (minutes):  45   Critical care time was exclusive of:  Separately billable procedures and treating other patients and teaching time   Critical care was necessary to treat or prevent imminent or life-threatening deterioration of the following conditions:  Cardiac failure   Critical care was time spent personally by me on the following activities:  Development of treatment plan with patient or surrogate, discussions with consultants, evaluation of patient's response to treatment, examination of patient, ordering and review of laboratory studies, ordering and review of radiographic studies, ordering and performing treatments and interventions, pulse oximetry, re-evaluation of patient's condition and review of old charts   I assumed direction of critical care for this patient from another provider in my specialty: no     Care discussed with: admitting provider   .1-3 Lead EKG Interpretation Performed by: Blake Divine, MD Authorized by: Blake Divine, MD     Interpretation: normal     ECG rate:  70-90   ECG rate assessment: normal     Rhythm: sinus rhythm     Ectopy: none     Conduction: normal     MEDICATIONS ORDERED IN ED: Medications  heparin bolus via infusion 3,000 Units (has no administration in time range)  heparin ADULT infusion 100 units/mL (25000 units/275mL) (has no administration in time range)  ondansetron (ZOFRAN) injection 4 mg (4 mg Intravenous Given 11/09/21 1556)  lactated ringers bolus 1,000 mL (1,000 mLs Intravenous New Bag/Given 11/09/21 1545)  alum & mag hydroxide-simeth (MAALOX/MYLANTA) 200-200-20 MG/5ML suspension 15 mL (15 mLs Oral Given 11/09/21 1557)    And  lidocaine (XYLOCAINE) 2 % viscous mouth solution 15 mL (15 mLs Oral Given  11/09/21 1557)  aspirin chewable tablet 324 mg (324 mg Oral Given 11/09/21 1641)  iohexol (OMNIPAQUE) 350 MG/ML injection 75 mL (75 mLs Intravenous Contrast Given 11/09/21 1736)     IMPRESSION / MDM / ASSESSMENT AND PLAN / ED COURSE  I reviewed the triage vital signs and the nursing notes.                              81 y.o. male with past medical history of prostate cancer and psoriasis who presents to the ED complaining of 2 months of persistent nausea and early satiety with occasional discomfort in his epigastrium and chest feeling like things are getting stuck.  Differential diagnosis includes, but is not limited to, gastritis, GERD, ACS,  pancreatitis, hepatitis, peptic ulcer disease, biliary colic, cholecystitis.  Patient is nontoxic-appearing and in no acute distress, vital signs are reassuring.  He has minimal pain at this time and a benign abdominal exam.  Symptoms sound most consistent with gastritis and GERD, we will further assess with CBC, CMP, and lipase.  Also assess for cardiac etiology with EKG and troponin, although low suspicion for this given atypical symptoms.  We will treat symptomatically with IV Zofran, IV fluid bolus, and GI cocktail.  Given chronicity of symptoms, if labs are unremarkable we will hold off on imaging.  Labs are remarkable for elevated troponin of greater than 400, CBC and CMP are unremarkable with no anemia or electrolyte abnormality.  Given subtle ST elevation noted on EKG, case discussed with Dr. Fletcher Anon of cardiology, agrees EKG is not consistent with STEMI but recommends admission on heparin drip.  Patient also given loading dose of aspirin.  Lung mass noted on chest x-ray which was further assessed with CTA of his chest.  This is negative for PE but is highly concerning for lung malignancy.  Case discussed with hospitalist for admission.  The patient is on the cardiac monitor to evaluate for evidence of arrhythmia and/or significant heart rate  changes.       FINAL CLINICAL IMPRESSION(S) / ED DIAGNOSES   Final diagnoses:  Nausea  Lung mass  NSTEMI (non-ST elevated myocardial infarction) ([place])     Rx / DC Orders   ED Discharge Orders     None        Note:  This document was prepared using Dragon voice recognition software and may include unintentional dictation errors.   Blake Divine, MD 11/09/21 [phone]

## 2021-11-09 NOTE — ED Triage Notes (Signed)
Pt reports nausea for 2 months and decreased intake. Wife recently passed away. Recent lab work visible in chart. Wants to wait for additional labs until assessed by MD

## 2021-11-10 ENCOUNTER — Encounter: Payer: Self-pay | Admitting: Internal Medicine

## 2021-11-10 ENCOUNTER — Observation Stay (HOSPITAL_COMMUNITY)
Admit: 2021-11-10 | Discharge: 2021-11-10 | Disposition: A | Discharge status: Home / Self Care | Payer: Medicare Other | Attending: Cardiovascular Disease | Admitting: Cardiovascular Disease

## 2021-11-10 ENCOUNTER — Observation Stay: Payer: Medicare Other

## 2021-11-10 DIAGNOSIS — R110 Nausea: Secondary | ICD-10-CM

## 2021-11-10 DIAGNOSIS — Z87891 Personal history of nicotine dependence: Secondary | ICD-10-CM | POA: Diagnosis not present

## 2021-11-10 DIAGNOSIS — Z803 Family history of malignant neoplasm of breast: Secondary | ICD-10-CM | POA: Diagnosis not present

## 2021-11-10 DIAGNOSIS — R1012 Left upper quadrant pain: Secondary | ICD-10-CM | POA: Diagnosis not present

## 2021-11-10 DIAGNOSIS — I248 Other forms of acute ischemic heart disease: Secondary | ICD-10-CM

## 2021-11-10 DIAGNOSIS — R079 Chest pain, unspecified: Secondary | ICD-10-CM | POA: Diagnosis not present

## 2021-11-10 DIAGNOSIS — R1013 Epigastric pain: Secondary | ICD-10-CM | POA: Diagnosis not present

## 2021-11-10 DIAGNOSIS — F4320 Adjustment disorder, unspecified: Secondary | ICD-10-CM | POA: Diagnosis present

## 2021-11-10 DIAGNOSIS — R109 Unspecified abdominal pain: Secondary | ICD-10-CM

## 2021-11-10 DIAGNOSIS — C3490 Malignant neoplasm of unspecified part of unspecified bronchus or lung: Secondary | ICD-10-CM

## 2021-11-10 DIAGNOSIS — R778 Other specified abnormalities of plasma proteins: Secondary | ICD-10-CM

## 2021-11-10 DIAGNOSIS — Z841 Family history of disorders of kidney and ureter: Secondary | ICD-10-CM | POA: Diagnosis not present

## 2021-11-10 DIAGNOSIS — Z20822 Contact with and (suspected) exposure to covid-19: Secondary | ICD-10-CM | POA: Diagnosis present

## 2021-11-10 DIAGNOSIS — C771 Secondary and unspecified malignant neoplasm of intrathoracic lymph nodes: Secondary | ICD-10-CM | POA: Diagnosis present

## 2021-11-10 DIAGNOSIS — R531 Weakness: Secondary | ICD-10-CM | POA: Diagnosis not present

## 2021-11-10 DIAGNOSIS — Z7951 Long term (current) use of inhaled steroids: Secondary | ICD-10-CM | POA: Diagnosis not present

## 2021-11-10 DIAGNOSIS — K5900 Constipation, unspecified: Secondary | ICD-10-CM | POA: Diagnosis present

## 2021-11-10 DIAGNOSIS — R918 Other nonspecific abnormal finding of lung field: Secondary | ICD-10-CM

## 2021-11-10 DIAGNOSIS — Z8711 Personal history of peptic ulcer disease: Secondary | ICD-10-CM | POA: Diagnosis not present

## 2021-11-10 DIAGNOSIS — G4730 Sleep apnea, unspecified: Secondary | ICD-10-CM | POA: Diagnosis present

## 2021-11-10 DIAGNOSIS — R1310 Dysphagia, unspecified: Secondary | ICD-10-CM | POA: Diagnosis present

## 2021-11-10 DIAGNOSIS — K7689 Other specified diseases of liver: Secondary | ICD-10-CM | POA: Diagnosis present

## 2021-11-10 DIAGNOSIS — Z923 Personal history of irradiation: Secondary | ICD-10-CM | POA: Diagnosis not present

## 2021-11-10 DIAGNOSIS — C3412 Malignant neoplasm of upper lobe, left bronchus or lung: Secondary | ICD-10-CM | POA: Diagnosis present

## 2021-11-10 DIAGNOSIS — Z8546 Personal history of malignant neoplasm of prostate: Secondary | ICD-10-CM | POA: Diagnosis not present

## 2021-11-10 DIAGNOSIS — Z8249 Family history of ischemic heart disease and other diseases of the circulatory system: Secondary | ICD-10-CM | POA: Diagnosis not present

## 2021-11-10 DIAGNOSIS — K219 Gastro-esophageal reflux disease without esophagitis: Secondary | ICD-10-CM | POA: Diagnosis present

## 2021-11-10 DIAGNOSIS — E871 Hypo-osmolality and hyponatremia: Secondary | ICD-10-CM | POA: Diagnosis present

## 2021-11-10 DIAGNOSIS — Z681 Body mass index (BMI) 19 or less, adult: Secondary | ICD-10-CM | POA: Diagnosis not present

## 2021-11-10 DIAGNOSIS — Z79899 Other long term (current) drug therapy: Secondary | ICD-10-CM | POA: Diagnosis not present

## 2021-11-10 DIAGNOSIS — Z515 Encounter for palliative care: Secondary | ICD-10-CM | POA: Diagnosis not present

## 2021-11-10 DIAGNOSIS — Z7189 Other specified counseling: Secondary | ICD-10-CM | POA: Diagnosis not present

## 2021-11-10 DIAGNOSIS — C61 Malignant neoplasm of prostate: Secondary | ICD-10-CM

## 2021-11-10 DIAGNOSIS — Z9049 Acquired absence of other specified parts of digestive tract: Secondary | ICD-10-CM | POA: Diagnosis not present

## 2021-11-10 DIAGNOSIS — Z801 Family history of malignant neoplasm of trachea, bronchus and lung: Secondary | ICD-10-CM | POA: Diagnosis not present

## 2021-11-10 DIAGNOSIS — E43 Unspecified severe protein-calorie malnutrition: Secondary | ICD-10-CM | POA: Diagnosis present

## 2021-11-10 LAB — URINALYSIS, COMPLETE (UACMP) WITH MICROSCOPIC
Bacteria, UA: NONE SEEN
Bilirubin Urine: NEGATIVE
Glucose, UA: NEGATIVE mg/dL
Ketones, ur: 40 mg/dL — AB
Leukocytes,Ua: NEGATIVE
Nitrite: NEGATIVE
Protein, ur: NEGATIVE mg/dL
Specific Gravity, Urine: 1.015 (ref 1.005–1.030)
Squamous Epithelial / HPF: NONE SEEN (ref 0–5)
pH: 7.5 (ref 5.0–8.0)

## 2021-11-10 LAB — ECHOCARDIOGRAM COMPLETE
AR max vel: 2.77 cm2
AV Area VTI: 2.93 cm2
AV Area mean vel: 2.55 cm2
AV Mean grad: 3 mmHg
AV Peak grad: 5.4 mmHg
Ao pk vel: 1.16 m/s
Area-P 1/2: 3.97 cm2
Height: 69 in
MV VTI: 2.77 cm2
S' Lateral: 2.25 cm
Weight: 2160.01 oz

## 2021-11-10 LAB — CBC
HCT: 38.9 % — ABNORMAL LOW (ref 39.0–52.0)
Hemoglobin: 13.2 g/dL (ref 13.0–17.0)
MCH: 29.9 pg (ref 26.0–34.0)
MCHC: 33.9 g/dL (ref 30.0–36.0)
MCV: 88 fL (ref 80.0–100.0)
Platelets: 199 10*3/uL (ref 150–400)
RBC: 4.42 MIL/uL (ref 4.22–5.81)
RDW: 13.5 % (ref 11.5–15.5)
WBC: 6.9 10*3/uL (ref 4.0–10.5)
nRBC: 0 % (ref 0.0–0.2)

## 2021-11-10 LAB — PROCALCITONIN: Procalcitonin: <0.1 ng/mL

## 2021-11-10 LAB — HEPARIN LEVEL (UNFRACTIONATED)
Heparin Unfractionated: 0.12 IU/mL — ABNORMAL LOW (ref 0.30–0.70)
Heparin Unfractionated: 0.25 IU/mL — ABNORMAL LOW (ref 0.30–0.70)

## 2021-11-10 LAB — TROPONIN I (HIGH SENSITIVITY)
Troponin I (High Sensitivity): 382 ng/L (ref <18)
Troponin I (High Sensitivity): 407 ng/L (ref <18)

## 2021-11-10 MED ORDER — ADULT MULTIVITAMIN W/MINERALS CH
1.0000 | ORAL_TABLET | Freq: Every day | ORAL | Status: DC
  Administered 2021-11-11 – 2021-11-17 (×6): 1 via ORAL
  Filled 2021-11-10 (×6): qty 1

## 2021-11-10 MED ORDER — ENSURE ENLIVE PO LIQD
237.0000 mL | Freq: Three times a day (TID) | ORAL | Status: DC
  Administered 2021-11-10 – 2021-11-17 (×8): 237 mL via ORAL

## 2021-11-10 MED ORDER — SODIUM CHLORIDE 0.9 % IV SOLN
6.2500 mg | Freq: Once | INTRAVENOUS | Status: AC | PRN
  Administered 2021-11-11: 01:00:00 6.25 mg via INTRAVENOUS
  Filled 2021-11-10 (×3): qty 0.25

## 2021-11-10 MED ORDER — ATORVASTATIN CALCIUM 20 MG PO TABS
80.0000 mg | ORAL_TABLET | Freq: Every day | ORAL | Status: DC
  Administered 2021-11-10 – 2021-11-17 (×7): 80 mg via ORAL
  Filled 2021-11-10 (×7): qty 4

## 2021-11-10 MED ORDER — ONDANSETRON HCL 4 MG/2ML IJ SOLN
4.0000 mg | Freq: Four times a day (QID) | INTRAMUSCULAR | Status: DC | PRN
  Administered 2021-11-10 – 2021-11-16 (×4): 4 mg via INTRAVENOUS
  Filled 2021-11-10 (×6): qty 2

## 2021-11-10 MED ORDER — HEPARIN BOLUS VIA INFUSION
1000.0000 [IU] | Freq: Once | INTRAVENOUS | Status: AC
  Administered 2021-11-10: 15:00:00 1000 [IU] via INTRAVENOUS
  Filled 2021-11-10: qty 1000

## 2021-11-10 MED ORDER — HEPARIN BOLUS VIA INFUSION
1800.0000 [IU] | Freq: Once | INTRAVENOUS | Status: AC
  Administered 2021-11-10: 03:00:00 1800 [IU] via INTRAVENOUS
  Filled 2021-11-10: qty 1800

## 2021-11-10 MED ORDER — METOPROLOL TARTRATE 25 MG PO TABS
12.5000 mg | ORAL_TABLET | Freq: Two times a day (BID) | ORAL | Status: DC
  Administered 2021-11-10 – 2021-11-17 (×14): 12.5 mg via ORAL
  Filled 2021-11-10 (×14): qty 1

## 2021-11-10 MED ORDER — GADOBUTROL 1 MMOL/ML IV SOLN
7.0000 mL | Freq: Once | INTRAVENOUS | Status: AC | PRN
  Administered 2021-11-10: 14:00:00 7 mL via INTRAVENOUS

## 2021-11-10 MED ORDER — FAMOTIDINE 20 MG PO TABS: 20.0000 mg | ORAL_TABLET | Freq: Every day | ORAL | Status: DC

## 2021-11-10 MED ORDER — ASPIRIN EC 81 MG PO TBEC: 81.0000 mg | DELAYED_RELEASE_TABLET | Freq: Every day | ORAL | Status: DC

## 2021-11-10 NOTE — Consult Note (Signed)
ANTICOAGULATION CONSULT NOTE   Pharmacy Consult for heparin Indication: chest pain/ACS  No Known Allergies  Patient Measurements: Height: 5\' 9"  (175.3 cm) Weight: 61.2 kg (135 lb) IBW/kg (Calculated) : 70.7 Heparin Dosing Weight: 61.2 kg   Vital Signs: Temp: 97.8 F (36.6 C) (01/24 0610) Temp Source: Oral (01/24 0610) BP: 108/61 (01/24 0610) Pulse Rate: 70 (01/24 0610)  Labs: Recent Labs    11/09/21 1536 11/09/21 1734 11/09/21 2358 11/10/21 0143  HGB 14.1  --   --  13.2  HCT 42.0  --   --  38.9*  PLT 215  --   --  199  HEPARINUNFRC  --   --   --  0.12*  CREATININE 0.72  --   --   --   TROPONINIHS 410* 491* 407* 382*     Estimated Creatinine Clearance: 61.6 mL/min (by C-G formula based on SCr of 0.72 mg/dL).   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 21 capsule 0    ipratropium (ATROVENT) 0.06 % nasal spray Place 2 sprays into both nostrils 4 (four) times daily. 15 mL 12    Scheduled:  Infusions:  PRN:  Anti-infectives (From admission, onward)    None       Assessment: Pharmacy consulted to start heparin for ACS. Elevated trop. No DOAC PTA.   Goal of Therapy:  Heparin level 0.3-0.7 units/ml Monitor platelets by anticoagulation protocol: Yes    Plan:  Heparin level remains subtherapeutic despite recent rate increase: Bolus 1000 units x 1, then increase heparin rate to 1100 units/hr Recheck heparin level in 8 hrs after rate change CBC daily while on heparin  Vallery Sa, PharmD 11/10/2021 7:01 AM

## 2021-11-10 NOTE — Progress Notes (Signed)
*  PRELIMINARY RESULTS* Echocardiogram 2D Echocardiogram has been performed.  Jared Char Creedence Evian Salguero 11/10/2021, 11:56 AM

## 2021-11-10 NOTE — Consult Note (Signed)
ANTICOAGULATION CONSULT NOTE   Pharmacy Consult for heparin Indication: chest pain/ACS  No Known Allergies  Patient Measurements: Height: 5\' 9"  (175.3 cm) Weight: 61.2 kg (135 lb) IBW/kg (Calculated) : 70.7 Heparin Dosing Weight: 61.2 kg   Vital Signs: Temp: 98.4 F (36.9 C) (01/23 2110) Temp Source: Oral (01/23 2110) BP: 136/87 (01/23 2110) Pulse Rate: 77 (01/23 2110)  Labs: Recent Labs    11/09/21 1536 11/09/21 1734 11/09/21 2358 11/10/21 0143  HGB 14.1  --   --  13.2  HCT 42.0  --   --  38.9*  PLT 215  --   --  199  HEPARINUNFRC  --   --   --  0.12*  CREATININE 0.72  --   --   --   TROPONINIHS 410* 491* 407*  --      Estimated Creatinine Clearance: 61.6 mL/min (by C-G formula based on SCr of 0.72 mg/dL).   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 21 capsule 0    ipratropium (ATROVENT) 0.06 % nasal spray Place 2 sprays into both nostrils 4 (four) times daily. 15 mL 12    Scheduled:  Infusions:  PRN:  Anti-infectives (From admission, onward)    None       Assessment: Pharmacy consulted to start heparin for ACS. Elevated trop. No DOAC PTA.   Goal of Therapy:  Heparin level 0.3-0.7 units/ml Monitor platelets by anticoagulation protocol: Yes  1/24 0143 HL 0.12, subtherapeutic   Plan:  Bolus 1800 units x 1 Increase heparin rate to 950 units/hr Recheck HL in 8 hr after rate change CBC daily while on heparin  Renda Rolls, PharmD, [facility] 11/10/2021 2:15 AM

## 2021-11-10 NOTE — Consult Note (Addendum)
Hematology/Oncology Consult note [facility] [phone] [phone]  Patient Care Team: Birdie Sons, MD as PCP - General (Family Medicine) Rockey Situ Kathlene November, MD as PCP - Cardiology (Cardiology) Franchot Gallo, MD as Consulting Physician (Urology)   Name of the patient: Jared Mullen  [id number]  14-May-1941    Reason for consult: Lung mass   Requesting physician: Dr. Cordelia Poche  Date of visit: 11/10/2021    History of presenting illness- Patient is a 81 year old male who was admitted to the hospital on 11/09/2021 with symptoms of abdominal pain nausea.  He has had about 20 pound weight loss in the last 4 months as well as chronic cough.  He underwent CT angio chest which showed left perihilar upper lobe masslike opacity measuring 6.6 x 5 x 6.1 cm contiguous with the hilum.  Margins are irregular and spiculated with linear extension into the periphery of the left upper lobe.  Findings concerning for primary bronchogenic malignancy.  Perihilar mass contiguous with the hilum but there appears to be distinct left hilar adenopathy.  Separate 3 mm left upper lobe pulmonary nodule.  Severe narrowing of the left upper lobe bronchus due to left perihilar mass.  Low-density lesions noted in the liver with cysts simple fluid density which were previously characterized by hepatic cysts on MRI.  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  MRI brain with and without contrast did not show any evidence of metastatic disease.Oncology consulted for further managementPatient also noted to have elevated troponin and therefore cardiology was consulted.  He is presently on heparin drip but no further ischemic work-up deemed necessary by cardiology.  ECOG PS- 2  Pain scale- 0   Review of systems- Review of Systems  Constitutional:  Positive for malaise/fatigue and weight loss. Negative for chills and fever.  HENT:  Negative for  congestion, ear discharge and nosebleeds.   Eyes:  Negative for blurred vision.  Respiratory:  Positive for cough. Negative for hemoptysis, sputum production, shortness of breath and wheezing.   Cardiovascular:  Negative for chest pain, palpitations, orthopnea and claudication.  Gastrointestinal:  Negative for abdominal pain, blood in stool, constipation, diarrhea, heartburn, melena, nausea and vomiting.  Genitourinary:  Negative for dysuria, flank pain, frequency, hematuria and urgency.  Musculoskeletal:  Negative for back pain, joint pain and myalgias.  Skin:  Negative for rash.  Neurological:  Negative for dizziness, tingling, focal weakness, seizures, weakness and headaches.  Endo/Heme/Allergies:  Does not bruise/bleed easily.  Psychiatric/Behavioral:  Negative for depression and suicidal ideas. The patient does not have insomnia.    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 baseline inferolateral ST elevation dating back to at least 2012.   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   Sleep  apnea    Urinary frequency    Weak urinary stream    Weight loss      Past Surgical History:  Procedure Laterality Date   biospy  09/30/2011-   TRUS/Bx Prostate - 5/12/biopsies positive for cancer, glandular size 32.5 cc   CHOLECYSTECTOMY  1992   RADIOACTIVE SEED  IMPLANT  02/09/2012   Procedure: RADIOACTIVE SEED IMPLANT;  Surgeon: Franchot Gallo, MD;  Location: [facility];  Service: Urology;  Laterality: N/A;  RAD TECH OK PER ANNE AT MAIN OR    UVULOPALATOPHARYNGOPLASTY      Social History   Socioeconomic History   Marital status: Widowed    Spouse name: Not on file   Number of children: 3   Years of education: Not on file   Highest education level: High school graduate  Occupational History   Occupation: retired  Tobacco Use   Smoking status: Former    Packs/day: 2.00    Years: 28.00    Pack years: 56.00    Types: Cigarettes    Quit date: 10/18/1980    Years since quitting: 41.0   Smokeless tobacco: Never  Substance and Sexual Activity   Alcohol use: No   Drug use: No   Sexual activity: Not on file    Comment: Low libido  Other Topics Concern   Not on file  Social History Narrative   Lives locally.  Was caring for his wife until she passed in December.  Sedentary.   Social Determinants of Health   Financial Resource Strain: Not on file  Food Insecurity: Not on file  Transportation Needs: Not on file  Physical Activity: Not on file  Stress: Not on file  Social Connections: Not on file  Intimate Partner Violence: Not on file     Family History  Problem Relation Age of Onset   Heart disease Father    Breast cancer Sister    Cancer Sister        lung   Lung cancer Brother    Cancer Brother        bladder   Lung cancer Sister    Cancer Sister        breast   Kidney disease Brother      Current Facility-Administered Medications:    0.9 %  sodium chloride infusion, , Intravenous, Continuous, Florina Ou V, MD, Last Rate: 50 mL/hr at 11/10/21 0622, Infusion Verify at 11/10/21 4034   acetaminophen (TYLENOL) tablet 650 mg, 650 mg, Oral, Q6H PRN, 650 mg at 11/10/21 1533 **OR** acetaminophen (TYLENOL) suppository 650 mg, 650 mg, Rectal, Q6H PRN, Para Skeans, MD   atorvastatin (LIPITOR) tablet 80 mg, 80  mg, Oral, Daily, Theora Gianotti, NP, 80 mg at 11/10/21 1519   heparin ADULT infusion 100 units/mL (25000 units/268mL), 1,100 Units/hr, Intravenous, Continuous, Dallie Piles, RPH, Last Rate: 11 mL/hr at 11/10/21 1430, 1,100 Units/hr at 11/10/21 1430   hydrALAZINE (APRESOLINE) injection 5 mg, 5 mg, Intravenous, Q6H PRN, Para Skeans, MD   melatonin tablet 5 mg, 5 mg, Oral, QHS, Florina Ou V, MD, 5 mg at 11/09/21 2352   metoprolol tartrate (LOPRESSOR) tablet 12.5 mg, 12.5 mg, Oral, BID, Theora Gianotti, NP, 12.5 mg at 11/10/21 1520   morphine 2 MG/ML injection 1 mg, 1 mg, Intravenous, Q6H PRN, Para Skeans, MD   nitroGLYCERIN (NITROSTAT) SL tablet 0.4 mg, 0.4 mg, Sublingual, Q5 min PRN, Para Skeans, MD   pantoprazole (PROTONIX) injection 40 mg, 40 mg, Intravenous,  Q12H, Para Skeans, MD, 40 mg at 11/10/21 1032   Physical exam:  Vitals:   11/10/21 0610 11/10/21 0803 11/10/21 1520 11/10/21 1525  BP: 108/61 129/84 (!) 144/98 (!) 144/98  Pulse: 70 69 93 93  Resp: 18   16  Temp: 97.8 F (36.6 C) (!) 97.5 F (36.4 C)  (!) 100.5 F (38.1 C)  TempSrc: Oral Oral  Oral  SpO2: 98% 98%  94%  Weight:      Height:       Physical Exam Constitutional:      Comments: Appears fatigued  Cardiovascular:     Rate and Rhythm: Normal rate and regular rhythm.     Heart sounds: Normal heart sounds.  Pulmonary:     Effort: Pulmonary effort is normal.     Breath sounds: Normal breath sounds.  Abdominal:     General: Bowel sounds are normal. There is no distension.     Palpations: Abdomen is soft.     Tenderness: There is no abdominal tenderness.  Skin:    General: Skin is warm and dry.  Neurological:     Mental Status: He is alert and oriented to person, place, and time.       CMP Latest Ref Rng & Units 11/09/2021  Glucose 70 - 99 mg/dL 122(H)  BUN 8 - 23 mg/dL 16  Creatinine 0.61 - 1.24 mg/dL 0.72  Sodium 135 - 145 mmol/L 132(L)  Potassium 3.5 - 5.1 mmol/L 4.5   Chloride 98 - 111 mmol/L 96(L)  CO2 22 - 32 mmol/L 29  Calcium 8.9 - 10.3 mg/dL 9.4  Total Protein 6.5 - 8.1 g/dL 7.4  Total Bilirubin 0.3 - 1.2 mg/dL 1.2  Alkaline Phos 38 - 126 U/L 71  AST 15 - 41 U/L 17  ALT 0 - 44 U/L 13   CBC Latest Ref Rng & Units 11/10/2021  WBC 4.0 - 10.5 K/uL 6.9  Hemoglobin 13.0 - 17.0 g/dL 13.2  Hematocrit 39.0 - 52.0 % 38.9(L)  Platelets 150 - 400 K/uL 199    @IMAGES @  DG Chest 2 View  Result Date: 11/09/2021 CLINICAL DATA:  Nausea, belching decreased appetite for 2 months, history prostate cancer EXAM: CHEST - 2 VIEW COMPARISON:  03/07/2017 rib radiographs, chest radiograph 11/16/2016 FINDINGS: Normal heart size, mediastinal contours, and pulmonary vascularity. Small calcifications in the chest bilaterally again identified likely calcified pleural plaques. New LEFT perihilar mass 6.6 x 6.4 cm highly suspicious for pulmonary neoplasm. No acute infiltrate, pleural effusion or pneumothorax. No definite acute bone lesions. IMPRESSION: New LEFT perihilar mass 6.6 x 6.4 cm highly suspicious for a pulmonary neoplasm; CT chest with contrast recommended for further evaluation. Calcifications in the chest bilaterally most consistent with calcified pleural plaques, unchanged, question prior asbestos exposure. Aortic Atherosclerosis (ICD10-I70.0). Electronically Signed   By: Lavonia Dana M.D.   On: 11/09/2021 17:09   CT Angio Chest PE W/Cm &/Or Wo Cm  Result Date: 11/09/2021 CLINICAL DATA:  Pulmonary embolism (PE) suspected, high prob Two month history of nausea and decreased appetite. Symptoms after patient's wife done 2 months ago. EXAM: CT ANGIOGRAPHY CHEST WITH CONTRAST TECHNIQUE: Multidetector CT imaging of the chest was performed using the standard protocol during bolus administration of intravenous contrast. Multiplanar CT image reconstructions and MIPs were obtained to evaluate the vascular anatomy. RADIATION DOSE REDUCTION: This exam was performed according to the  departmental dose-optimization program which includes automated exposure control, adjustment of the mA and/or kV according to patient size and/or use of  iterative reconstruction technique. CONTRAST:  23mL OMNIPAQUE IOHEXOL 350 MG/ML SOLN COMPARISON:  Chest radiograph earlier today. FINDINGS: Cardiovascular: There are no filling defects within the pulmonary arteries to suggest pulmonary embolus. The lingular pulmonary arteries are attenuated due to left perihilar mass. Atherosclerosis of the thoracic aorta. No aortic aneurysm or acute aortic findings. The heart is normal in size. There is no pericardial effusion. Mediastinum/Nodes: Left upper lobe perihilar mass is contiguous with the hilum. There is likely separate suprahilar adenopathy within 18 mm lymph node series 5, image 46. Likely infrahilar lymph node measuring 2.4 cm series 5, image 58. 9 mm right hilar node. Occasional prominent left lower paratracheal nodes, including a 10 mm node series 5, image 38. No definite supraclavicular adenopathy. No visualized thyroid nodule. Upper esophagus is patulous. No esophageal wall thickening. Lungs/Pleura: Left perihilar upper lobe masslike opacity. This measures 6.6 x 5.0 x 6.1 cm, measured on series 7, image 46 and series 8, image 55. Margins are irregular and spiculated with linear extension to the periphery of the pleural surface of the left upper lobe. There are small adjacent satellite nodules. Separate left upper lobe 3 mm nodule series 7, image 32. Irregular opacity at the left lung apex which appears triangular measuring 11 mm, series 7, image 21. Biapical pleuroparenchymal scarring, some of which is calcified. Bilateral calcified pleural plaques. Multiple calcified granuloma in the right lower lobe. Severe narrowing of the left upper lobe bronchus due to in left perihilar mass. No pleural effusion. Upper Abdomen: Innumerable low-density lesions within the liver, largest lesions appear to be simple fluid  density. Previous abdominal MRI 06/22/2019 demonstrated multiple hepatic cysts. No discrete adrenal nodule. Musculoskeletal: Slight flattening of mid thoracic vertebra. No destructive lytic or focal blastic osseous lesion. Review of the MIP images confirms the above findings. IMPRESSION: 1. No pulmonary embolus. 2. Left perihilar upper lobe masslike opacity measuring 6.6 x 5.0 x 6.1 cm, contiguous with the hilum. Margins are irregular and spiculated with linear extension to the periphery of the left upper lobe. Findings are highly suspicious for primary bronchogenic malignancy. 3. Perihilar mass is contiguous with the hilum, however there appears to be a discrete left hilar adenopathy. Prominent left lower paratracheal nodes, and a nonspecific 9 mm right hilar node. 4. Separate 3 mm left upper lobe nodule. Irregular opacity at the left lung apex appears triangular measuring 11 mm, nonspecific but may be scarring. 5. Severe narrowing of the left upper lobe bronchus due to left perihilar mass. 6. Innumerable low-density lesions within the liver, largest lesions appear to be simple fluid density. These were previously characterized as hepatic cysts on MRI, although not well assessed on the current exam due to phase of contrast. 7. 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 Aortic Atherosclerosis (ICD10-I70.0). Electronically Signed   By: Keith Rake M.D.   On: 11/09/2021 18:02   MR BRAIN W WO CONTRAST  Result Date: 11/10/2021 CLINICAL DATA:  Metastatic disease evaluation, lung mass EXAM: MRI HEAD WITHOUT AND WITH CONTRAST TECHNIQUE: Multiplanar, multiecho pulse sequences of the brain and surrounding structures were obtained without and with intravenous contrast. CONTRAST:  31mL GADAVIST GADOBUTROL 1 MMOL/ML IV SOLN COMPARISON:  None. FINDINGS: Brain: There is no acute infarction or intracranial hemorrhage. There is no intracranial mass, mass effect, or edema. There is no hydrocephalus or  extra-axial fluid collection. Prominence of the ventricles and sulci reflects parenchymal volume loss. Patchy T2 hyperintensity in the supratentorial white matter is nonspecific but may reflect minor chronic microvascular ischemic  changes. No abnormal enhancement. Vascular: Major vessel flow voids at the skull base are preserved. Skull and upper cervical spine: Normal marrow signal is preserved. Sinuses/Orbits: Paranasal sinus mucosal thickening. Orbits are unremarkable. Other: Sella is unremarkable.  Mastoid air cells are clear. IMPRESSION: No evidence of intracranial metastatic disease. Electronically Signed   By: Macy Mis M.D.   On: 11/10/2021 14:37   ECHOCARDIOGRAM COMPLETE  Result Date: 11/10/2021    ECHOCARDIOGRAM REPORT   Patient Name:   YAZEN ROSKO Date of Exam: 11/10/2021 Medical Rec #:  [id number]   Height:       69.0 in Accession #:    [id number]  Weight:       135.0 lb Date of Birth:  03-11-1941    BSA:          1.748 m Patient Age:    49 years    BP:           129/84 mmHg Patient Gender: M           HR:           78 bpm. Exam Location:  [facility] Procedure: 2D Echo, Color Doppler, Cardiac Doppler and Strain Analysis Indications:     R07.9 Chest Pain  History:         Patient has no prior history of Echocardiogram examinations.                  Risk Factors:Sleep Apnea.  Sonographer:     Charmayne Sheer Referring Phys:  [place] Diagnosing Phys: Ida Rogue MD  Sonographer Comments: Suboptimal parasternal window. Global longitudinal strain was attempted. IMPRESSIONS  1. Left ventricular ejection fraction, by estimation, is 55 to 60%. The left ventricle has normal function. The left ventricle has no regional wall motion abnormalities. Left ventricular diastolic parameters are consistent with Grade I diastolic dysfunction (impaired relaxation). The average left ventricular global longitudinal strain is -18.0 %. The global longitudinal strain is normal.  2. Right ventricular systolic function  is normal. The right ventricular size is normal.  3. The mitral valve is normal in structure. No evidence of mitral valve regurgitation. No evidence of mitral stenosis.  4. The aortic valve was not well visualized. Aortic valve regurgitation is not visualized. No aortic stenosis is present.  5. The inferior vena cava is normal in size with greater than 50% respiratory variability, suggesting right atrial pressure of 3 mmHg. FINDINGS  Left Ventricle: Left ventricular ejection fraction, by estimation, is 55 to 60%. The left ventricle has normal function. The left ventricle has no regional wall motion abnormalities. The average left ventricular global longitudinal strain is -18.0 %. The global longitudinal strain is normal. The left ventricular internal cavity size was normal in size. There is no left ventricular hypertrophy. Left ventricular diastolic parameters are consistent with Grade I diastolic dysfunction (impaired relaxation). Right Ventricle: The right ventricular size is normal. No increase in right ventricular wall thickness. Right ventricular systolic function is normal. Left Atrium: Left atrial size was normal in size. Right Atrium: Right atrial size was normal in size. Pericardium: There is no evidence of pericardial effusion. Mitral Valve: The mitral valve is normal in structure. No evidence of mitral valve regurgitation. No evidence of mitral valve stenosis. MV peak gradient, 2.4 mmHg. The mean mitral valve gradient is 1.0 mmHg. Tricuspid Valve: The tricuspid valve is normal in structure. Tricuspid valve regurgitation is not demonstrated. No evidence of tricuspid stenosis. Aortic Valve: The aortic valve was not well  visualized. Aortic valve regurgitation is not visualized. No aortic stenosis is present. Aortic valve mean gradient measures 3.0 mmHg. Aortic valve peak gradient measures 5.4 mmHg. Aortic valve area, by VTI measures 2.93 cm. Pulmonic Valve: The pulmonic valve was normal in structure.  Pulmonic valve regurgitation is not visualized. No evidence of pulmonic stenosis. Aorta: The aortic root is normal in size and structure. Venous: The inferior vena cava is normal in size with greater than 50% respiratory variability, suggesting right atrial pressure of 3 mmHg. IAS/Shunts: No atrial level shunt detected by color flow Doppler.  LEFT VENTRICLE PLAX 2D LVIDd:         4.03 cm   Diastology LVIDs:         2.25 cm   LV e' medial:    6.09 cm/s LV PW:         0.96 cm   LV E/e' medial:  11.1 LV IVS:        0.70 cm   LV e' lateral:   11.00 cm/s LVOT diam:     2.10 cm   LV E/e' lateral: 6.2 LV SV:         56 LV SV Index:   32        2D Longitudinal Strain LVOT Area:     3.46 cm  2D Strain GLS Avg:     -18.0 %  RIGHT VENTRICLE RV Basal diam:  3.68 cm LEFT ATRIUM             Index        RIGHT ATRIUM           Index LA diam:        2.70 cm 1.54 cm/m   RA Area:     13.10 cm LA Vol (A2C):   37.0 ml 21.16 ml/m  RA Volume:   31.20 ml  17.85 ml/m LA Vol (A4C):   25.1 ml 14.36 ml/m LA Biplane Vol: 31.8 ml 18.19 ml/m  AORTIC VALVE                    PULMONIC VALVE AV Area (Vmax):    2.77 cm     PV Vmax:       0.61 m/s AV Area (Vmean):   2.55 cm     PV Vmean:      41.500 cm/s AV Area (VTI):     2.93 cm     PV VTI:        0.105 m AV Vmax:           116.00 cm/s  PV Peak grad:  1.5 mmHg AV Vmean:          81.000 cm/s  PV Mean grad:  1.0 mmHg AV VTI:            0.190 m AV Peak Grad:      5.4 mmHg AV Mean Grad:      3.0 mmHg LVOT Vmax:         92.70 cm/s LVOT Vmean:        59.600 cm/s LVOT VTI:          0.161 m LVOT/AV VTI ratio: 0.85  AORTA Ao Root diam: 3.60 cm MITRAL VALVE MV Area (PHT): 3.97 cm    SHUNTS MV Area VTI:   2.77 cm    Systemic VTI:  0.16 m MV Peak grad:  2.4 mmHg    Systemic Diam: 2.10 cm MV Mean grad:  1.0 mmHg MV Vmax:  0.78 m/s MV Vmean:      55.6 cm/s MV Decel Time: 191 msec MV E velocity: 67.70 cm/s MV A velocity: 81.80 cm/s MV E/A ratio:  0.83 Ida Rogue MD Electronically signed by  Ida Rogue MD Signature Date/Time: 11/10/2021/2:12:02 PM    Final     Assessment and plan- Patient is a 81 y.o. male admitted for nausea and weight loss found to have a large lung mass in the left upper lobe concerning for lung cancer  I have reviewed CT chest images independently.  Patient had an MRI abdomen back in 2020 which showed hepatic cysts.  Based on CT chest it is not clear if he has any other evidence of systemic disease.  I would therefore recommend getting a CT abdomen pelvis with contrast as well to complete his staging work-up.  MRI brain did not show any evidence of distant metastatic disease in the brain.  CT chest does show large left perihilar mass like opacity measuring 6.6 x 5 x 6.1 cm along with left hilar adenopathy and narrowing of the left upper bronchus concerning for bronchogenic carcinoma.  This would likely be best accessed via bronchoscopy and it would be reasonable to have pulmonary on board tomorrow.  I will also reach out to IR to see if CT-guided biopsy would be feasible.  If radiology or pulmonary desire PET CT scan prior to biopsy this will need to be done as an outpatient.  Discussed with patient and his daughter that if there is no evidence of distant metastatic disease and biopsy shows lung cancer this would be potentially stage III disease which is ideally treated with concurrent chemoradiation.  At baseline patient lives alone and was independent of his ADLs before he started feeling poorly over the last 2 months.  He will assess his performance status as an outpatient before deciding if patient would qualify for concurrent chemoradiation versus sequential treatment.  As of now patient is interested in pursuing a biopsy and considering treatment options.  I will also have palliative care see the patient to assist with symptom management and overall goals of care.  Goals of treatment whether it would be curative or palliative as well as prognosis will be based on  final staging work-up  >50 min spent in reviewing images, labs, face to face encounter with patient showing images to patient and daughter and discussing overall plan of care and counseling.    Visit Diagnosis 1. Nausea   2. Lung mass   3. NSTEMI (non-ST elevated myocardial infarction) ([place])     Dr. Randa Evens, MD, MPH [facility] [phone] 11/10/2021

## 2021-11-10 NOTE — Progress Notes (Addendum)
Patient ID: Jared Mullen, male   DOB: 27-Feb-1941, 81 y.o.   MRN: [id number] [facility] PROGRESS NOTE  KAYA KLAUSING JGG:[id number] DOB: Aug 28, 1941 DOA: 11/09/2021 PCP: Birdie Sons, MD  HPI/Subjective: Patient coming in with mostly nausea.  Has had some weight loss.  Lots of belching.  Objective: Vitals:   11/10/21 0610 11/10/21 0803  BP: 108/61 129/84  Pulse: 70 69  Resp: 18   Temp: 97.8 F (36.6 C) (!) 97.5 F (36.4 C)  SpO2: 98% 98%    Intake/Output Summary (Last 24 hours) at 11/10/2021 1508 Last data filed at 11/10/2021 0815 Gross per 24 hour  Intake 604.56 ml  Output 900 ml  Net -295.44 ml   Filed Weights   11/09/21 1139 11/09/21 2110  Weight: 61.2 kg 61.2 kg    ROS: Review of Systems  Respiratory:  Negative for shortness of breath.   Cardiovascular:  Negative for chest pain.  Gastrointestinal:  Positive for heartburn and nausea. Negative for abdominal pain and vomiting.  Exam: Physical Exam HENT:     Head: Normocephalic.     Mouth/Throat:     Pharynx: No oropharyngeal exudate.  Eyes:     General: Lids are normal.     Conjunctiva/sclera: Conjunctivae normal.  Cardiovascular:     Rate and Rhythm: Normal rate and regular rhythm.     Heart sounds: Normal heart sounds, S1 normal and S2 normal.  Pulmonary:     Breath sounds: No decreased breath sounds, wheezing, rhonchi or rales.  Abdominal:     Palpations: Abdomen is soft.     Tenderness: There is no abdominal tenderness.  Musculoskeletal:     Right lower leg: No swelling.     Left lower leg: No swelling.  Skin:    General: Skin is warm.     Findings: No rash.  Neurological:     Mental Status: He is alert and oriented to person, place, and time.     Comments: Able to straight leg raise bilaterally      Scheduled Meds:  aspirin EC  81 mg Oral Daily   atorvastatin  80 mg Oral Daily   melatonin  5 mg Oral QHS   metoprolol tartrate  12.5 mg Oral BID   pantoprazole (PROTONIX) IV  40 mg Intravenous  Q12H   Continuous Infusions:  sodium chloride 50 mL/hr at 11/10/21 0622   heparin 1,100 Units/hr (11/10/21 1430)   Brief history.  81 year old man with past medical history of prostate cancer status post radiation and seed implantation.  He presents with nausea and some weight loss and found to have an elevated troponin. Assessment/Plan:  Elevated troponin in the absence of chest pain and shortness of breath.  I suspect demand ischemia.  Echocardiogram showed normal EF.  Empirically on heparin drip.  Will hold aspirin for likely biopsy.  Continue metoprolol and Lipitor.  [place] cardiology consultation.  Troponin trended down to 382 Large lung mass with lymph nodes.  Case discussed with Dr. Janese Banks oncology to see.  I ordered an MRI of the brain with and without contrast which was negative for metastases.  I will order a CT scan of the abdomen pelvis for tomorrow to rule out metastatic disease and to see if there is someplace easier to obtain a biopsy. Nausea.  Patient empirically started on Protonix. Prior history of prostate cancer Cyst on liver on previous MRI Hyponatremia.  On gentle IV fluid hydration.  Recheck BMP tomorrow morning Just notified patient has a temperature of  100.5.  Will obtain blood cultures and urine analysis.     Code Status:     Code Status Orders  (From admission, onward)           Start     Ordered   11/09/21 1929  Full code  Continuous        11/09/21 1932           Code Status History     This patient has a current code status but no historical code status.      Family Communication: Daughter at bedside Disposition Plan: Status is: Inpatient  Patient with elevated troponin likely demand ischemia.  Empirically on heparin drip for 48 hours.  Patient also found to have a large lung mass.  We will get a CAT scan of the abdomen pelvis tomorrow morning to see if there is any other place to biopsy.  Consultants: Oncology Cardiology  [place]  [facility]

## 2021-11-10 NOTE — Consult Note (Signed)
Cardiology Consult    Patient ID: Jared Mullen MRN: [id number], DOB/AGE: 01-27-1941   Admit date: 11/09/2021 Date of Consult: 11/10/2021  Primary Physician: Birdie Sons, MD Primary Cardiologist: Ida Rogue, MD Requesting Provider: R. Wieting, MD  Patient Profile    Jared Mullen is a 81 y.o. male with a history of prostate cancer and sleep apnea who is being seen today for the evaluation of elevated HsTroponin in the setting of abdominal pain, nausea, and wt loss at the request of Jared Mullen.  Past Medical History   Past Medical History:  Diagnosis Date   Abnormal ECG    a. baseline inferolateral ST elevation dating back to at least 2012.   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 w/o a prior cardiac hx.  He has a h/o prostate cancer and sleep apnea.  Over the past 6-12 months, he has had a productive cough, which has been treated with tessalon and ipratropium.  Over the same period of time, he started noting peri-prandial abdominal discomfort and nausea.  In that setting he reduced his caloric intake, in order to prevent abd discomfort and nausea.  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  Based on his description, she had some degree of dementia and required "24 hr care."  In taking care of his wife, he did very little outside of the house and notes that his muscle strength slowly waned to the point that following her passing, when he tried to increase his activity, he noted that he was only able to shuffle short distances.  His wife passed in December, and he and his three dtrs have been grieving since.  He has lost about 30 lbs over the past few mos, which he attributes to not eating/abd discomfort/nausea.  He says that as he had put off caring for himself while caring for his wife, on 1/23, after talking w/ his dtr, he became scared, w/o any recent change in symptoms, and they agreed to have him come to the ED for eval.  Here, ECG showed Sinus Rhythm @ 76 w/ inferolateral ST elevation, septal infarct, and TWI in V1-2, similar to ECGs dating back > 10 yrs.  His HsTrop was elevated @ 410  491  407  382.  Na low @ 132.  Labs otw unremarkable.  CXR showed a new L perihilar mass measuring 6.6 x 6.4 cm.  This was followed by CT of the chest, which also showed a 6.6 x 5.0 x 6.1 cm L perihilar upper lob mass-like opacity w/  extension to the periphery of the LUL, highly suspicious for primary bronchogenic malignancy.  Prominent L hilar adenopaty, prominent left lower paratracheal, nonspecific 62mm R hilar, and separate 73mm LUL nodules were also noted.  There was severe narrowing of the LUL bronchus due to L perihilar mass.  Innumerable low-density lesions w/in the liver were noted.  Pt was admitted by the medicine service and we have been asked to evaluate.  He denies any prior history of chest pain or dyspnea.  Inpatient Medications     melatonin  5 mg Oral QHS   pantoprazole (PROTONIX) IV  40 mg Intravenous Q12H    Family History    Family History  Problem Relation Age of Onset   Heart disease Father    Breast cancer Sister    Cancer Sister        lung   Lung cancer Brother     Cancer Brother        bladder   Lung cancer Sister    Cancer Sister        breast   Kidney disease Brother    He indicated that his mother is deceased. He indicated that his father is deceased. He indicated that both of his sisters are alive. He indicated that only one of his two brothers is alive. He reported the following about his brother of unknown status: Renal Failure.   Social History    Social History   Socioeconomic History   Marital status: Widowed    Spouse name: Not on file   Number of children: 3   Years of education: Not on file   Highest education level: High school graduate  Occupational History   Occupation: retired  Tobacco Use   Smoking status: Former    Packs/day: 2.00    Years: 28.00    Pack years: 56.00    Types: Cigarettes    Quit date: 10/18/1980    Years since quitting: 41.0   Smokeless tobacco: Never  Substance and Sexual Activity   Alcohol use: No   Drug use: No   Sexual activity: Not on file    Comment: Low libido  Other Topics Concern   Not on file  Social History Narrative   Not on file   Social Determinants of Health   Financial Resource Strain: Not on file  Food Insecurity: Not on file  Transportation Needs: Not on file  Physical Activity: Not on file  Stress: Not on file  Social Connections: Not on file  Intimate Partner Violence: Not on file     Review of Systems    General:  +++ weakness, +++ unsteady/shuffling gait, no chills, fever, night sweats or weight changes.  Cardiovascular:  No chest pain, dyspnea on exertion, edema, orthopnea, palpitations, paroxysmal nocturnal dyspnea. Dermatological: No rash, lesions/masses Respiratory: +++ productive cough w/o hemoptysis x ~ 1 yr.  No dyspnea Urologic: No hematuria, dysuria Abdominal:   +++ nausea, +++ vomiting, +++ wt loss, no diarrhea, bright red blood per rectum, melena, or hematemesis Neurologic:  No visual changes, +++ generalized wkns, no changes in mental status. All  other systems reviewed and are otherwise negative except as noted above.  Physical Exam    Blood pressure 129/84, pulse 69, temperature (!) 97.5 F (36.4 C), temperature source Oral, resp. rate 18, height 5\' 9"  (1.753 m), weight 61.2 kg, SpO2 98 %.  General: Pleasant, NAD Psych: Normal affect. Neuro: Alert and oriented X 3. Moves all extremities spontaneously. HEENT: Normal  Neck: Supple without bruits  or JVD. Lungs:  Resp regular and unlabored, CTA. Heart: RRR no s3, s4, or murmurs. Abdomen: Soft, non-tender, non-distended, BS + x 4.  Extremities: No clubbing, cyanosis or edema. DP/PT2+, Radials 2+ and equal bilaterally.  Labs    Cardiac Enzymes Recent Labs  Lab 11/09/21 1536 11/09/21 1734 11/09/21 2358 11/10/21 0143  TROPONINIHS 410* 491* 407* 382*      Lab Results  Component Value Date   WBC 6.9 11/10/2021   HGB 13.2 11/10/2021   HCT 38.9 (L) 11/10/2021   MCV 88.0 11/10/2021   PLT 199 11/10/2021    Recent Labs  Lab 11/09/21 1536  NA 132*  K 4.5  CL 96*  CO2 29  BUN 16  CREATININE 0.72  CALCIUM 9.4  PROT 7.4  BILITOT 1.2  ALKPHOS 71  ALT 13  AST 17  GLUCOSE 122*   Lab Results  Component Value Date   CHOL 134 11/09/2021   HDL 41 11/09/2021   LDLCALC 86 11/09/2021   TRIG 34 11/09/2021     Radiology Studies    DG Chest 2 View  Result Date: 11/09/2021 CLINICAL DATA:  Nausea, belching decreased appetite for 2 months, history prostate cancer EXAM: CHEST - 2 VIEW COMPARISON:  03/07/2017 rib radiographs, chest radiograph 11/16/2016 FINDINGS: Normal heart size, mediastinal contours, and pulmonary vascularity. Small calcifications in the chest bilaterally again identified likely calcified pleural plaques. New LEFT perihilar mass 6.6 x 6.4 cm highly suspicious for pulmonary neoplasm. No acute infiltrate, pleural effusion or pneumothorax. No definite acute bone lesions. IMPRESSION: New LEFT perihilar mass 6.6 x 6.4 cm highly suspicious for a pulmonary neoplasm;  CT chest with contrast recommended for further evaluation. Calcifications in the chest bilaterally most consistent with calcified pleural plaques, unchanged, question prior asbestos exposure. Aortic Atherosclerosis (ICD10-I70.0). Electronically Signed   By: Lavonia Dana M.D.   On: 11/09/2021 17:09   CT Angio Chest PE W/Cm &/Or Wo Cm  Result Date: 11/09/2021 CLINICAL DATA:  Pulmonary embolism (PE) suspected, high prob Two month history of nausea and decreased appetite. Symptoms after patient's wife done 2 months ago. EXAM: CT ANGIOGRAPHY CHEST WITH CONTRAST TECHNIQUE: Multidetector CT imaging of the chest was performed using the standard protocol during bolus administration of intravenous contrast. Multiplanar CT image reconstructions and MIPs were obtained to evaluate the vascular anatomy. RADIATION DOSE REDUCTION: This exam was performed according to the departmental dose-optimization program which includes automated exposure control, adjustment of the mA and/or kV according to patient size and/or use of iterative reconstruction technique. CONTRAST:  21mL OMNIPAQUE IOHEXOL 350 MG/ML SOLN COMPARISON:  Chest radiograph earlier today. FINDINGS: Cardiovascular: There are no filling defects within the pulmonary arteries to suggest pulmonary embolus. The lingular pulmonary arteries are attenuated due to left perihilar mass. Atherosclerosis of the thoracic aorta. No aortic aneurysm or acute aortic findings. The heart is normal in size. There is no pericardial effusion. Mediastinum/Nodes: Left upper lobe perihilar mass is contiguous with the hilum. There is likely separate suprahilar adenopathy within 18 mm lymph node series 5, image 46. Likely infrahilar lymph node measuring 2.4 cm series 5, image 58. 9 mm right hilar node. Occasional prominent left lower paratracheal nodes, including a 10 mm node series 5, image 38. No definite supraclavicular adenopathy. No visualized thyroid nodule. Upper esophagus is patulous. No  esophageal wall thickening. Lungs/Pleura: Left perihilar upper lobe masslike opacity. This measures 6.6 x 5.0 x 6.1 cm, measured on series 7, image 46 and series 8, image 55. Margins are irregular and spiculated  with linear extension to the periphery of the pleural surface of the left upper lobe. There are small adjacent satellite nodules. Separate left upper lobe 3 mm nodule series 7, image 32. Irregular opacity at the left lung apex which appears triangular measuring 11 mm, series 7, image 21. Biapical pleuroparenchymal scarring, some of which is calcified. Bilateral calcified pleural plaques. Multiple calcified granuloma in the right lower lobe. Severe narrowing of the left upper lobe bronchus due to in left perihilar mass. No pleural effusion. Upper Abdomen: Innumerable low-density lesions within the liver, largest lesions appear to be simple fluid density. Previous abdominal MRI 06/22/2019 demonstrated multiple hepatic cysts. No discrete adrenal nodule. Musculoskeletal: Slight flattening of mid thoracic vertebra. No destructive lytic or focal blastic osseous lesion. Review of the MIP images confirms the above findings. IMPRESSION: 1. No pulmonary embolus. 2. Left perihilar upper lobe masslike opacity measuring 6.6 x 5.0 x 6.1 cm, contiguous with the hilum. Margins are irregular and spiculated with linear extension to the periphery of the left upper lobe. Findings are highly suspicious for primary bronchogenic malignancy. 3. Perihilar mass is contiguous with the hilum, however there appears to be a discrete left hilar adenopathy. Prominent left lower paratracheal nodes, and a nonspecific 9 mm right hilar node. 4. Separate 3 mm left upper lobe nodule. Irregular opacity at the left lung apex appears triangular measuring 11 mm, nonspecific but may be scarring. 5. Severe narrowing of the left upper lobe bronchus due to left perihilar mass. 6. Innumerable low-density lesions within the liver, largest lesions appear  to be simple fluid density. These were previously characterized as hepatic cysts on MRI, although not well assessed on the current exam due to phase of contrast. 7. 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 Aortic Atherosclerosis (ICD10-I70.0). Electronically Signed   By: Keith Rake M.D.   On: 11/09/2021 18:02    ECG & Cardiac Imaging    RSR, 76, <92mm inferolateral ST elevation w/ TWI in V1-2, prior septal infarct - similar to prior ECGs - personally reviewed.  Assessment & Plan    1.  Elevated HsTroponin/Demand ischemia: Pt w/ ~ 1 yr h/o productive cough and peri-prandial abd discomfort associated w/ nausea.  He has no h/o chest pain or dyspnea, but is very sedentary.  Recently lost his wife of 19 yrs in December and has been grieving.  In the setting of all of the above, he has not been eating and has lost about 30 lbs.  Due to ongoing symptoms of cough, abd discomfort, and nausea, he presented to the ED on 1/23, and was found to have an elevated hsTrop of 410  491  407  382.  He has not had c/p since presentation. ECG w/ mild inferolateral ST elevation and prior septal infarct w/ TWI in V1-V2 - overall similar to ECGs dating back 10 yrs.  W/u also notable for absence of PE on CTA but w/ large L lung mass and multiple nodules, pending w/u.  In the absence of symptoms and relatively flat HsTrop trend, this does not appear to represent ACS.  Cont heparin x 48 hrs.  F/u echo to eval EF/wall motion (? Takotsubo-like event).  Will add asa, statin, and low-dose ? blocker.  Further recs pending echo.  2.  Left Lung Mass/nodules:  further w/u per medicine team.  Signed, Murray Hodgkins, NP 11/10/2021, 8:45 AM  For questions or updates, please contact   Please consult [email or web address] for contact info under Cardiology/STEMI.

## 2021-11-10 NOTE — Progress Notes (Signed)
Initial Nutrition Assessment  DOCUMENTATION CODES:   Severe malnutrition in context of chronic illness  INTERVENTION:   Ensure Enlive po TID, each supplement provides 350 kcal and 20 grams of protein  Magic cup TID with meals, each supplement provides 290 kcal and 9 grams of protein  MVI po daily   Liberalize diet   Pt at high refeed risk; recommend monitor potassium, magnesium and phosphorus labs daily until stable  NUTRITION DIAGNOSIS:   Severe Malnutrition related to chronic illness (prostate cancer, new lung mass) as evidenced by moderate fat depletion, severe fat depletion, moderate muscle depletion, severe muscle depletion, 25 percent weight loss in 1 year.  GOAL:   Patient will meet greater than or equal to 90% of their needs  MONITOR:   PO intake, Supplement acceptance, Labs, Weight trends, Skin, I & O's  REASON FOR ASSESSMENT:   Malnutrition Screening Tool    ASSESSMENT:   81 y/o male with h/o prostate cancer s/p XRT who is admitted with nausea, poor oral intake and wt loss. Pt found to have large lung mass  Met with pt and pt's daughter in room today. Pt reports poor appetite and oral intake for several months pta. Pt has been having abdominal pain and nausea. Pt reports that he just does not have a desire to eat. Pt reports that he has Ensure at home but that he has not starting drinking it yet. Family does report that pt lost his wife several months ago. Pt reports that he ate some at breakfast this morning but did not eat his lunch as it was cold once he got back from MRI. Per chart, pt is down 43lbs(25%) over the past year; this is significant weight loss. Pt is aware that he has lost a significant amount of weight. RD dicussed with pt the importance of adequate nutrition needed to preserve lean muscle. Pt would like to have chocolate Ensure in hospital. RD will add supplements and MVI to help pt meet his estimated needs. RD will also liberalize pt's diet. Pt is  at high refeed risk. Plan is for CT abdomin tomorrow to r/o metastatic disease.   Medications reviewed and include: melatonin, protonix, heparin   Labs reviewed: Na 132(L)  NUTRITION - FOCUSED PHYSICAL EXAM:  Flowsheet Row Most Recent Value  Orbital Region Moderate depletion  Upper Arm Region Severe depletion  Thoracic and Lumbar Region Severe depletion  Buccal Region Moderate depletion  Temple Region Moderate depletion  Clavicle Bone Region Severe depletion  Clavicle and Acromion Bone Region Severe depletion  Scapular Bone Region Moderate depletion  Dorsal Hand Moderate depletion  Patellar Region Moderate depletion  Anterior Thigh Region Moderate depletion  Posterior Calf Region Moderate depletion  Edema (RD Assessment) None  Hair Reviewed  Eyes Reviewed  Mouth Reviewed  Skin Reviewed  Nails Reviewed   Diet Order:   Diet Order             Diet regular Room service appropriate? Yes; Fluid consistency: Thin  Diet effective now                  EDUCATION NEEDS:   Education needs have been addressed  Skin:  Skin Assessment: Reviewed RN Assessment  Last BM:  pta  Height:   Ht Readings from Last 1 Encounters:  11/09/21 5' 9"  (1.753 m)    Weight:   Wt Readings from Last 1 Encounters:  11/09/21 61.2 kg    Ideal Body Weight:  72.7 kg  BMI:  Body  mass index is 19.94 kg/m.  Estimated Nutritional Needs:   Kcal:  1700-2000kcal/day  Protein:  85-100g/day  Fluid:  1.6-1.8L/day  Koleen Distance MS, RD, LDN Please refer to [facility] for RD and/or RD on-call/weekend/after hours pager

## 2021-11-10 NOTE — TOC Initial Note (Signed)
Transition of Care [facility]) - Initial/Assessment Note    Patient Details  Name: Jared Mullen MRN: [id number] Date of Birth: August 22, 1941  Transition of Care [facility]) CM/SW Contact:    Beverly Sessions, RN Phone Number: 11/10/2021, 2:19 PM  Clinical Narrative:                  Transition of Care [facility]) Screening Note   Patient Details  Name: Jared Mullen Date of Birth: 22-Jul-1941   Transition of Care [facility]) CM/SW Contact:    Beverly Sessions, RN Phone Number: 11/10/2021, 2:20 PM    Transition of Care Department [facility]) has reviewed patient and no TOC needs have been identified at this time. We will continue to monitor patient advancement through interdisciplinary progression rounds. If new patient transition needs arise, please place a TOC consult.          Patient Goals and CMS Choice        Expected Discharge Plan and Services                                                Prior Living Arrangements/Services                       Activities of Daily Living Home Assistive Devices/Equipment: Eyeglasses ADL Screening (condition at time of admission) Patient's cognitive ability adequate to safely complete daily activities?: Yes Is the patient deaf or have difficulty hearing?: No Does the patient have difficulty seeing, even when wearing glasses/contacts?: No Does the patient have difficulty concentrating, remembering, or making decisions?: No Patient able to express need for assistance with ADLs?: Yes Does the patient have difficulty dressing or bathing?: No Independently performs ADLs?: Yes (appropriate for developmental age) Does the patient have difficulty walking or climbing stairs?: No Weakness of Legs: None Weakness of Arms/Hands: None  Permission Sought/Granted                  Emotional Assessment              Admission diagnosis:  Nausea [R11.0] Lung mass [R91.8] NSTEMI (non-ST elevated myocardial infarction) ([place])  [I21.4] Abdominal pain [R10.9] Patient Active Problem List   Diagnosis Date Noted   Abdominal pain 11/09/2021   Lung mass 11/09/2021   NSTEMI (non-ST elevated myocardial infarction) ([place]) 11/09/2021   Osteopenia 12/15/2020   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 [address] Phone: [phone] Fax: [phone]     Social Determinants of Health (SDOH) Interventions    Readmission Risk Interventions No flowsheet data found.

## 2021-11-11 ENCOUNTER — Inpatient Hospital Stay: Payer: Medicare Other

## 2021-11-11 DIAGNOSIS — Z515 Encounter for palliative care: Secondary | ICD-10-CM

## 2021-11-11 DIAGNOSIS — I248 Other forms of acute ischemic heart disease: Secondary | ICD-10-CM | POA: Diagnosis not present

## 2021-11-11 DIAGNOSIS — R1012 Left upper quadrant pain: Secondary | ICD-10-CM | POA: Diagnosis not present

## 2021-11-11 DIAGNOSIS — R918 Other nonspecific abnormal finding of lung field: Secondary | ICD-10-CM | POA: Diagnosis not present

## 2021-11-11 DIAGNOSIS — E43 Unspecified severe protein-calorie malnutrition: Secondary | ICD-10-CM | POA: Insufficient documentation

## 2021-11-11 LAB — BASIC METABOLIC PANEL
Anion gap: 8 (ref 5–15)
BUN: 16 mg/dL (ref 8–23)
CO2: 27 mmol/L (ref 22–32)
Calcium: 8.7 mg/dL — ABNORMAL LOW (ref 8.9–10.3)
Chloride: 100 mmol/L (ref 98–111)
Creatinine, Ser: 0.72 mg/dL (ref 0.61–1.24)
GFR, Estimated: >60 mL/min (ref >60)
Glucose, Bld: 112 mg/dL — ABNORMAL HIGH (ref 70–99)
Potassium: 3.9 mmol/L (ref 3.5–5.1)
Sodium: 135 mmol/L (ref 135–145)

## 2021-11-11 LAB — CBC
HCT: 37.5 % — ABNORMAL LOW (ref 39.0–52.0)
Hemoglobin: 12.5 g/dL — ABNORMAL LOW (ref 13.0–17.0)
MCH: 29.6 pg (ref 26.0–34.0)
MCHC: 33.3 g/dL (ref 30.0–36.0)
MCV: 88.7 fL (ref 80.0–100.0)
Platelets: 184 10*3/uL (ref 150–400)
RBC: 4.23 MIL/uL (ref 4.22–5.81)
RDW: 13.5 % (ref 11.5–15.5)
WBC: 8.1 10*3/uL (ref 4.0–10.5)
nRBC: 0 % (ref 0.0–0.2)

## 2021-11-11 LAB — PROCALCITONIN: Procalcitonin: 0.1 ng/mL

## 2021-11-11 LAB — HEPARIN LEVEL (UNFRACTIONATED): Heparin Unfractionated: 0.26 IU/mL — ABNORMAL LOW (ref 0.30–0.70)

## 2021-11-11 MED ORDER — IOHEXOL 300 MG/ML  SOLN
80.0000 mL | Freq: Once | INTRAMUSCULAR | Status: AC | PRN
  Administered 2021-11-11: 08:00:00 80 mL via INTRAVENOUS

## 2021-11-11 MED ORDER — HEPARIN BOLUS VIA INFUSION
1000.0000 [IU] | Freq: Once | INTRAVENOUS | Status: AC
  Administered 2021-11-11: 01:00:00 1000 [IU] via INTRAVENOUS
  Filled 2021-11-11: qty 1000

## 2021-11-11 MED ORDER — IOHEXOL 9 MG/ML PO SOLN
500.0000 mL | ORAL | Status: AC
  Administered 2021-11-11 (×2): 500 mL via ORAL

## 2021-11-11 MED ORDER — SENNOSIDES-DOCUSATE SODIUM 8.6-50 MG PO TABS
1.0000 | ORAL_TABLET | Freq: Every evening | ORAL | Status: DC | PRN
  Administered 2021-11-12: 1 via ORAL
  Filled 2021-11-11: qty 1

## 2021-11-11 MED ORDER — HYDROCODONE-ACETAMINOPHEN 5-325 MG PO TABS: 1.0000 | ORAL_TABLET | Freq: Four times a day (QID) | ORAL | Status: DC | PRN

## 2021-11-11 NOTE — Progress Notes (Addendum)
PROGRESS NOTE    Jared Mullen  IWL:[id number] DOB: [birth year] DOA: 11/09/2021 PCP: Birdie Sons, MD   Assessment & Plan:   Principal Problem:   Abdominal pain Active Problems:   Lung mass   Demand ischemia (HCC)   Elevated troponin   Nausea   Prostate cancer (HCC)   Protein-calorie malnutrition, severe  Fever: etiology unclear. Blood cxs are pending.   Elevated troponins: likely secondary to demand ischemia. No chest pain. Echo shows normal EF. Continue on metoprolol, statin  Large lung mass: w/ lymph nodes. MRI brain was negative for metastases. Will consult pulmon for lung mass biopsy. Onco following and recs   Hx of prostate cancer: management per onco   Cyst on liver: on previous MRI. Continue outpatient surveillance   Hyponatremia: resolved   Severe protein calorie malnutrition: continue w/ nutritional supplements   DVT prophylaxis: SCDs Code Status: full  Family Communication: called pt's daughter, Linus Orn, but no answer so I left a message  Disposition Plan: depends on PT/OT recs   Level of care: Med-Surg  Status is: Inpatient  Remains inpatient appropriate because: severity of illness    Consultants:  Onco Pulmon   Procedures:   Antimicrobials:  Subjective: Pt c/o fatigue   Objective: Vitals:   11/10/21 1835 11/10/21 2121 11/11/21 0611 11/11/21 0820  BP: 125/76 112/76 128/78 132/83  Pulse: 84 73 60 68  Resp: 20 18 19    Temp: 98.6 F (37 C) 97.7 F (36.5 C) 97.9 F (36.6 C) 97.7 F (36.5 C)  TempSrc: Oral Oral Oral   SpO2: 96% 95% 100% 98%  Weight:      Height:        Intake/Output Summary (Last 24 hours) at 11/11/2021 0842 Last data filed at 11/11/2021 0348 Gross per 24 hour  Intake 625.69 ml  Output 325 ml  Net 300.69 ml   Filed Weights   11/09/21 1139 11/09/21 2110  Weight: 61.2 kg 61.2 kg    Examination:  General exam: Appears calm and comfortable  Respiratory system: Clear to auscultation. Respiratory effort  normal. Cardiovascular system: S1 & S2 +. No rubs, gallops or clicks. Gastrointestinal system: Abdomen is nondistended, soft and nontender.  Normal bowel sounds heard. Central nervous system: Alert and oriented. Moves all extremities  Psychiatry: Judgement and insight appear normal. Flat mood and affect     Data Reviewed: I have personally reviewed following labs and imaging studies  CBC: Recent Labs  Lab 11/09/21 1536 11/10/21 0143 11/11/21 0505  WBC 7.7 6.9 8.1  NEUTROABS 5.6  --   --   HGB 14.1 13.2 12.5*  HCT 42.0 38.9* 37.5*  MCV 88.6 88.0 88.7  PLT 215 199 174   Basic Metabolic Panel: Recent Labs  Lab 11/09/21 1536 11/11/21 0505  NA 132* 135  K 4.5 3.9  CL 96* 100  CO2 29 27  GLUCOSE 122* 112*  BUN 16 16  CREATININE 0.72 0.72  CALCIUM 9.4 8.7*   GFR: Estimated Creatinine Clearance: 61.6 mL/min (by C-G formula based on SCr of 0.72 mg/dL). Liver Function Tests: Recent Labs  Lab 11/09/21 1536  AST 17  ALT 13  ALKPHOS 71  BILITOT 1.2  PROT 7.4  ALBUMIN 3.5   Recent Labs  Lab 11/09/21 1536  LIPASE 26   No results for input(s): AMMONIA in the last 168 hours. Coagulation Profile: No results for input(s): INR, PROTIME in the last 168 hours. Cardiac Enzymes: No results for input(s): CKTOTAL, CKMB, CKMBINDEX, TROPONINI in the last 168  hours. BNP (last 3 results) No results for input(s): PROBNP in the last 8760 hours. HbA1C: No results for input(s): HGBA1C in the last 72 hours. CBG: No results for input(s): GLUCAP in the last 168 hours. Lipid Profile: Recent Labs    11/09/21 1536  CHOL 134  HDL 41  LDLCALC 86  TRIG 34  CHOLHDL 3.3   Thyroid Function Tests: Recent Labs    11/09/21 1536  TSH 1.767   Anemia Panel: No results for input(s): VITAMINB12, FOLATE, FERRITIN, TIBC, IRON, RETICCTPCT in the last 72 hours. Sepsis Labs: Recent Labs  Lab 11/09/21 2028 11/10/21 1826 11/11/21 0505  PROCALCITON 0.10 <0.10 0.10    Recent Results  (from the past 240 hour(s))  Resp Panel by RT-PCR (Flu A&B, Covid) Nasopharyngeal Swab     Status: None   Collection Time: 11/09/21  5:34 PM   Specimen: Nasopharyngeal Swab; Nasopharyngeal(NP) swabs in vial transport medium  Result Value Ref Range Status   SARS Coronavirus 2 by RT PCR NEGATIVE NEGATIVE Final    Comment: (NOTE) SARS-CoV-2 target nucleic acids are NOT DETECTED.  The SARS-CoV-2 RNA is generally detectable in upper respiratory specimens during the acute phase of infection. The lowest concentration of SARS-CoV-2 viral copies this assay can detect is 138 copies/mL. A negative result does not preclude SARS-Cov-2 infection and should not be used as the sole basis for treatment or other patient management decisions. A negative result may occur with  improper specimen collection/handling, submission of specimen other than nasopharyngeal swab, presence of viral mutation(s) within the areas targeted by this assay, and inadequate number of viral copies(<138 copies/mL). A negative result must be combined with clinical observations, patient history, and epidemiological information. The expected result is Negative.  Fact Sheet for Patients:  EntrepreneurPulse.com.au  Fact Sheet for Healthcare Providers:  IncredibleEmployment.be  This test is no t yet approved or cleared by the [place] FDA and  has been authorized for detection and/or diagnosis of SARS-CoV-2 by FDA under an Emergency Use Authorization (EUA). This EUA will remain  in effect (meaning this test can be used) for the duration of the COVID-19 declaration under Section 564(b)(1) of the Act, 21 U.S.C.section 360bbb-3(b)(1), unless the authorization is terminated  or revoked sooner.       Influenza A by PCR NEGATIVE NEGATIVE Final   Influenza B by PCR NEGATIVE NEGATIVE Final    Comment: (NOTE) The Xpert Xpress SARS-CoV-2/FLU/RSV plus assay is intended as an aid in the  diagnosis of influenza from Nasopharyngeal swab specimens and should not be used as a sole basis for treatment. Nasal washings and aspirates are unacceptable for Xpert Xpress SARS-CoV-2/FLU/RSV testing.  Fact Sheet for Patients: EntrepreneurPulse.com.au  Fact Sheet for Healthcare Providers: IncredibleEmployment.be  This test is not yet approved or cleared by the [place] FDA and has been authorized for detection and/or diagnosis of SARS-CoV-2 by FDA under an Emergency Use Authorization (EUA). This EUA will remain in effect (meaning this test can be used) for the duration of the COVID-19 declaration under Section 564(b)(1) of the Act, 21 U.S.C. section 360bbb-3(b)(1), unless the authorization is terminated or revoked.  Performed at [facility], [address] [place], [place] [address]          Radiology Studies: DG Chest 2 View  Result Date: 11/09/2021 CLINICAL DATA:  Nausea, belching decreased appetite for 2 months, history prostate cancer EXAM: CHEST - 2 VIEW COMPARISON:  03/07/2017 rib radiographs, chest radiograph 11/16/2016 FINDINGS: Normal heart size, mediastinal contours, and pulmonary  vascularity. Small calcifications in the chest bilaterally again identified likely calcified pleural plaques. New LEFT perihilar mass 6.6 x 6.4 cm highly suspicious for pulmonary neoplasm. No acute infiltrate, pleural effusion or pneumothorax. No definite acute bone lesions. IMPRESSION: New LEFT perihilar mass 6.6 x 6.4 cm highly suspicious for a pulmonary neoplasm; CT chest with contrast recommended for further evaluation. Calcifications in the chest bilaterally most consistent with calcified pleural plaques, unchanged, question prior asbestos exposure. Aortic Atherosclerosis (ICD10-I70.0). Electronically Signed   By: Lavonia Dana M.D.   On: 11/09/2021 17:09   CT Angio Chest PE W/Cm &/Or Wo Cm  Result Date: 11/09/2021 CLINICAL DATA:   Pulmonary embolism (PE) suspected, high prob Two month history of nausea and decreased appetite. Symptoms after patient's wife done 2 months ago. EXAM: CT ANGIOGRAPHY CHEST WITH CONTRAST TECHNIQUE: Multidetector CT imaging of the chest was performed using the standard protocol during bolus administration of intravenous contrast. Multiplanar CT image reconstructions and MIPs were obtained to evaluate the vascular anatomy. RADIATION DOSE REDUCTION: This exam was performed according to the departmental dose-optimization program which includes automated exposure control, adjustment of the mA and/or kV according to patient size and/or use of iterative reconstruction technique. CONTRAST:  6mL OMNIPAQUE IOHEXOL 350 MG/ML SOLN COMPARISON:  Chest radiograph earlier today. FINDINGS: Cardiovascular: There are no filling defects within the pulmonary arteries to suggest pulmonary embolus. The lingular pulmonary arteries are attenuated due to left perihilar mass. Atherosclerosis of the thoracic aorta. No aortic aneurysm or acute aortic findings. The heart is normal in size. There is no pericardial effusion. Mediastinum/Nodes: Left upper lobe perihilar mass is contiguous with the hilum. There is likely separate suprahilar adenopathy within 18 mm lymph node series 5, image 46. Likely infrahilar lymph node measuring 2.4 cm series 5, image 58. 9 mm right hilar node. Occasional prominent left lower paratracheal nodes, including a 10 mm node series 5, image 38. No definite supraclavicular adenopathy. No visualized thyroid nodule. Upper esophagus is patulous. No esophageal wall thickening. Lungs/Pleura: Left perihilar upper lobe masslike opacity. This measures 6.6 x 5.0 x 6.1 cm, measured on series 7, image 46 and series 8, image 55. Margins are irregular and spiculated with linear extension to the periphery of the pleural surface of the left upper lobe. There are small adjacent satellite nodules. Separate left upper lobe 3 mm nodule  series 7, image 32. Irregular opacity at the left lung apex which appears triangular measuring 11 mm, series 7, image 21. Biapical pleuroparenchymal scarring, some of which is calcified. Bilateral calcified pleural plaques. Multiple calcified granuloma in the right lower lobe. Severe narrowing of the left upper lobe bronchus due to in left perihilar mass. No pleural effusion. Upper Abdomen: Innumerable low-density lesions within the liver, largest lesions appear to be simple fluid density. Previous abdominal MRI 06/22/2019 demonstrated multiple hepatic cysts. No discrete adrenal nodule. Musculoskeletal: Slight flattening of mid thoracic vertebra. No destructive lytic or focal blastic osseous lesion. Review of the MIP images confirms the above findings. IMPRESSION: 1. No pulmonary embolus. 2. Left perihilar upper lobe masslike opacity measuring 6.6 x 5.0 x 6.1 cm, contiguous with the hilum. Margins are irregular and spiculated with linear extension to the periphery of the left upper lobe. Findings are highly suspicious for primary bronchogenic malignancy. 3. Perihilar mass is contiguous with the hilum, however there appears to be a discrete left hilar adenopathy. Prominent left lower paratracheal nodes, and a nonspecific 9 mm right hilar node. 4. Separate 3 mm left upper lobe nodule. Irregular opacity  at the left lung apex appears triangular measuring 11 mm, nonspecific but may be scarring. 5. Severe narrowing of the left upper lobe bronchus due to left perihilar mass. 6. Innumerable low-density lesions within the liver, largest lesions appear to be simple fluid density. These were previously characterized as hepatic cysts on MRI, although not well assessed on the current exam due to phase of contrast. 7. 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 Aortic Atherosclerosis (ICD10-I70.0). Electronically Signed   By: Keith Rake M.D.   On: 11/09/2021 18:02   MR BRAIN W WO CONTRAST  Result  Date: 11/10/2021 CLINICAL DATA:  Metastatic disease evaluation, lung mass EXAM: MRI HEAD WITHOUT AND WITH CONTRAST TECHNIQUE: Multiplanar, multiecho pulse sequences of the brain and surrounding structures were obtained without and with intravenous contrast. CONTRAST:  65mL GADAVIST GADOBUTROL 1 MMOL/ML IV SOLN COMPARISON:  None. FINDINGS: Brain: There is no acute infarction or intracranial hemorrhage. There is no intracranial mass, mass effect, or edema. There is no hydrocephalus or extra-axial fluid collection. Prominence of the ventricles and sulci reflects parenchymal volume loss. Patchy T2 hyperintensity in the supratentorial white matter is nonspecific but may reflect minor chronic microvascular ischemic changes. No abnormal enhancement. Vascular: Major vessel flow voids at the skull base are preserved. Skull and upper cervical spine: Normal marrow signal is preserved. Sinuses/Orbits: Paranasal sinus mucosal thickening. Orbits are unremarkable. Other: Sella is unremarkable.  Mastoid air cells are clear. IMPRESSION: No evidence of intracranial metastatic disease. Electronically Signed   By: Macy Mis M.D.   On: 11/10/2021 14:37   ECHOCARDIOGRAM COMPLETE  Result Date: 11/10/2021    ECHOCARDIOGRAM REPORT   Patient Name:   Jared Mullen Date of Exam: 11/10/2021 Medical Rec #:  [id number]   Height:       69.0 in Accession #:    [id number]  Weight:       135.0 lb Date of Birth:  1941/04/23    BSA:          1.748 m Patient Age:    81 years    BP:           129/84 mmHg Patient Gender: M           HR:           78 bpm. Exam Location:  [facility] Procedure: 2D Echo, Color Doppler, Cardiac Doppler and Strain Analysis Indications:     R07.9 Chest Pain  History:         Patient has no prior history of Echocardiogram examinations.                  Risk Factors:Sleep Apnea.  Sonographer:     Charmayne Sheer Referring Phys:  [place] Diagnosing Phys: Ida Rogue MD  Sonographer Comments: Suboptimal parasternal window.  Global longitudinal strain was attempted. IMPRESSIONS  1. Left ventricular ejection fraction, by estimation, is 55 to 60%. The left ventricle has normal function. The left ventricle has no regional wall motion abnormalities. Left ventricular diastolic parameters are consistent with Grade I diastolic dysfunction (impaired relaxation). The average left ventricular global longitudinal strain is -18.0 %. The global longitudinal strain is normal.  2. Right ventricular systolic function is normal. The right ventricular size is normal.  3. The mitral valve is normal in structure. No evidence of mitral valve regurgitation. No evidence of mitral stenosis.  4. The aortic valve was not well visualized. Aortic valve regurgitation is not visualized. No aortic stenosis is present.  5. The  inferior vena cava is normal in size with greater than 50% respiratory variability, suggesting right atrial pressure of 3 mmHg. FINDINGS  Left Ventricle: Left ventricular ejection fraction, by estimation, is 55 to 60%. The left ventricle has normal function. The left ventricle has no regional wall motion abnormalities. The average left ventricular global longitudinal strain is -18.0 %. The global longitudinal strain is normal. The left ventricular internal cavity size was normal in size. There is no left ventricular hypertrophy. Left ventricular diastolic parameters are consistent with Grade I diastolic dysfunction (impaired relaxation). Right Ventricle: The right ventricular size is normal. No increase in right ventricular wall thickness. Right ventricular systolic function is normal. Left Atrium: Left atrial size was normal in size. Right Atrium: Right atrial size was normal in size. Pericardium: There is no evidence of pericardial effusion. Mitral Valve: The mitral valve is normal in structure. No evidence of mitral valve regurgitation. No evidence of mitral valve stenosis. MV peak gradient, 2.4 mmHg. The mean mitral valve gradient is 1.0  mmHg. Tricuspid Valve: The tricuspid valve is normal in structure. Tricuspid valve regurgitation is not demonstrated. No evidence of tricuspid stenosis. Aortic Valve: The aortic valve was not well visualized. Aortic valve regurgitation is not visualized. No aortic stenosis is present. Aortic valve mean gradient measures 3.0 mmHg. Aortic valve peak gradient measures 5.4 mmHg. Aortic valve area, by VTI measures 2.93 cm. Pulmonic Valve: The pulmonic valve was normal in structure. Pulmonic valve regurgitation is not visualized. No evidence of pulmonic stenosis. Aorta: The aortic root is normal in size and structure. Venous: The inferior vena cava is normal in size with greater than 50% respiratory variability, suggesting right atrial pressure of 3 mmHg. IAS/Shunts: No atrial level shunt detected by color flow Doppler.  LEFT VENTRICLE PLAX 2D LVIDd:         4.03 cm   Diastology LVIDs:         2.25 cm   LV e' medial:    6.09 cm/s LV PW:         0.96 cm   LV E/e' medial:  11.1 LV IVS:        0.70 cm   LV e' lateral:   11.00 cm/s LVOT diam:     2.10 cm   LV E/e' lateral: 6.2 LV SV:         56 LV SV Index:   32        2D Longitudinal Strain LVOT Area:     3.46 cm  2D Strain GLS Avg:     -18.0 %  RIGHT VENTRICLE RV Basal diam:  3.68 cm LEFT ATRIUM             Index        RIGHT ATRIUM           Index LA diam:        2.70 cm 1.54 cm/m   RA Area:     13.10 cm LA Vol (A2C):   37.0 ml 21.16 ml/m  RA Volume:   31.20 ml  17.85 ml/m LA Vol (A4C):   25.1 ml 14.36 ml/m LA Biplane Vol: 31.8 ml 18.19 ml/m  AORTIC VALVE                    PULMONIC VALVE AV Area (Vmax):    2.77 cm     PV Vmax:       0.61 m/s AV Area (Vmean):   2.55 cm     PV Vmean:  41.500 cm/s AV Area (VTI):     2.93 cm     PV VTI:        0.105 m AV Vmax:           116.00 cm/s  PV Peak grad:  1.5 mmHg AV Vmean:          81.000 cm/s  PV Mean grad:  1.0 mmHg AV VTI:            0.190 m AV Peak Grad:      5.4 mmHg AV Mean Grad:      3.0 mmHg LVOT Vmax:          92.70 cm/s LVOT Vmean:        59.600 cm/s LVOT VTI:          0.161 m LVOT/AV VTI ratio: 0.85  AORTA Ao Root diam: 3.60 cm MITRAL VALVE MV Area (PHT): 3.97 cm    SHUNTS MV Area VTI:   2.77 cm    Systemic VTI:  0.16 m MV Peak grad:  2.4 mmHg    Systemic Diam: 2.10 cm MV Mean grad:  1.0 mmHg MV Vmax:       0.78 m/s MV Vmean:      55.6 cm/s MV Decel Time: 191 msec MV E velocity: 67.70 cm/s MV A velocity: 81.80 cm/s MV E/A ratio:  0.83 Ida Rogue MD Electronically signed by Ida Rogue MD Signature Date/Time: 11/10/2021/2:12:02 PM    Final         Scheduled Meds:  atorvastatin  80 mg Oral Daily   feeding supplement  237 mL Oral TID BM   melatonin  5 mg Oral QHS   metoprolol tartrate  12.5 mg Oral BID   multivitamin with minerals  1 tablet Oral Daily   pantoprazole (PROTONIX) IV  40 mg Intravenous Q12H   Continuous Infusions:  heparin 1,250 Units/hr (11/11/21 0348)     LOS: 1 day    Time spent: 30 mins    Wyvonnia Dusky, MD [facility] Pager [phone]  If 7PM-7AM, please contact night-coverage 11/11/2021, 8:42 AM

## 2021-11-11 NOTE — Progress Notes (Signed)
Progress Note  Patient Name: Jared Mullen Date of Encounter: 11/11/2021  Primary Cardiologist: Ida Rogue, MD  Subjective   Says that he had a 'rough' night.  At one point, felt like he couldn't get a good breath.  Chest was tight.  He was given morphine and then promethazine with relief.  O2 sats/VS nl.  No recurrence.  Inpatient Medications    Scheduled Meds:  atorvastatin  80 mg Oral Daily   feeding supplement  237 mL Oral TID BM   melatonin  5 mg Oral QHS   metoprolol tartrate  12.5 mg Oral BID   multivitamin with minerals  1 tablet Oral Daily   pantoprazole (PROTONIX) IV  40 mg Intravenous Q12H   Continuous Infusions:  PRN Meds: acetaminophen **OR** acetaminophen, hydrALAZINE, morphine injection, nitroGLYCERIN, ondansetron (ZOFRAN) IV   Vital Signs    Vitals:   11/10/21 1835 11/10/21 2121 11/11/21 0611 11/11/21 0820  BP: 125/76 112/76 128/78 132/83  Pulse: 84 73 60 68  Resp: 20 18 19    Temp: 98.6 F (37 C) 97.7 F (36.5 C) 97.9 F (36.6 C) 97.7 F (36.5 C)  TempSrc: Oral Oral Oral   SpO2: 96% 95% 100% 98%  Weight:      Height:        Intake/Output Summary (Last 24 hours) at 11/11/2021 1004 Last data filed at 11/11/2021 0348 Gross per 24 hour  Intake 625.69 ml  Output 325 ml  Net 300.69 ml   Filed Weights   11/09/21 1139 11/09/21 2110  Weight: 61.2 kg 61.2 kg    Physical Exam   GEN: Well nourished, well developed, in no acute distress.  HEENT: Grossly normal.  Neck: Supple, no JVD, carotid bruits, or masses. Cardiac: RRR, no murmurs, rubs, or gallops. No clubbing, cyanosis, edema.  Radials 2+, DP/PT 1+ and equal bilaterally.  Respiratory:  Respirations regular and unlabored, clear to auscultation bilaterally. GI: Soft, nontender, nondistended, BS + x 4. MS: no deformity or atrophy. Skin: warm and dry, no rash. Neuro:  Strength and sensation are intact. Psych: AAOx3.  Normal affect.  Labs    Chemistry Recent Labs  Lab 11/09/21 1536  11/11/21 0505  NA 132* 135  K 4.5 3.9  CL 96* 100  CO2 29 27  GLUCOSE 122* 112*  BUN 16 16  CREATININE 0.72 0.72  CALCIUM 9.4 8.7*  PROT 7.4  --   ALBUMIN 3.5  --   AST 17  --   ALT 13  --   ALKPHOS 71  --   BILITOT 1.2  --   GFRNONAA >60 >60  ANIONGAP 7 8     Hematology Recent Labs  Lab 11/09/21 1536 11/10/21 0143 11/11/21 0505  WBC 7.7 6.9 8.1  RBC 4.74 4.42 4.23  HGB 14.1 13.2 12.5*  HCT 42.0 38.9* 37.5*  MCV 88.6 88.0 88.7  MCH 29.7 29.9 29.6  MCHC 33.6 33.9 33.3  RDW 13.6 13.5 13.5  PLT 215 199 184    Cardiac Enzymes  Recent Labs  Lab 11/09/21 1536 11/09/21 1734 11/09/21 2358 11/10/21 0143  TROPONINIHS 410* 491* 407* 382*     Lipids  Lab Results  Component Value Date   CHOL 134 11/09/2021   HDL 41 11/09/2021   LDLCALC 86 11/09/2021   TRIG 34 11/09/2021   CHOLHDL 3.3 11/09/2021    Radiology    DG Chest 2 View  Result Date: 11/09/2021 CLINICAL DATA:  Nausea, belching decreased appetite for 2 months, history prostate cancer EXAM:  CHEST - 2 VIEW COMPARISON:  03/07/2017 rib radiographs, chest radiograph 11/16/2016 FINDINGS: Normal heart size, mediastinal contours, and pulmonary vascularity. Small calcifications in the chest bilaterally again identified likely calcified pleural plaques. New LEFT perihilar mass 6.6 x 6.4 cm highly suspicious for pulmonary neoplasm. No acute infiltrate, pleural effusion or pneumothorax. No definite acute bone lesions. IMPRESSION: New LEFT perihilar mass 6.6 x 6.4 cm highly suspicious for a pulmonary neoplasm; CT chest with contrast recommended for further evaluation. Calcifications in the chest bilaterally most consistent with calcified pleural plaques, unchanged, question prior asbestos exposure. Aortic Atherosclerosis (ICD10-I70.0). Electronically Signed   By: Lavonia Dana M.D.   On: 11/09/2021 17:09   CT Angio Chest PE W/Cm &/Or Wo Cm  Result Date: 11/09/2021 CLINICAL DATA:  Pulmonary embolism (PE) suspected, high prob  Two month history of nausea and decreased appetite. Symptoms after patient's wife done 2 months ago. EXAM: CT ANGIOGRAPHY CHEST WITH CONTRAST TECHNIQUE: Multidetector CT imaging of the chest was performed using the standard protocol during bolus administration of intravenous contrast. Multiplanar CT image reconstructions and MIPs were obtained to evaluate the vascular anatomy. RADIATION DOSE REDUCTION: This exam was performed according to the departmental dose-optimization program which includes automated exposure control, adjustment of the mA and/or kV according to patient size and/or use of iterative reconstruction technique. CONTRAST:  73mL OMNIPAQUE IOHEXOL 350 MG/ML SOLN COMPARISON:  Chest radiograph earlier today. FINDINGS: Cardiovascular: There are no filling defects within the pulmonary arteries to suggest pulmonary embolus. The lingular pulmonary arteries are attenuated due to left perihilar mass. Atherosclerosis of the thoracic aorta. No aortic aneurysm or acute aortic findings. The heart is normal in size. There is no pericardial effusion. Mediastinum/Nodes: Left upper lobe perihilar mass is contiguous with the hilum. There is likely separate suprahilar adenopathy within 18 mm lymph node series 5, image 46. Likely infrahilar lymph node measuring 2.4 cm series 5, image 58. 9 mm right hilar node. Occasional prominent left lower paratracheal nodes, including a 10 mm node series 5, image 38. No definite supraclavicular adenopathy. No visualized thyroid nodule. Upper esophagus is patulous. No esophageal wall thickening. Lungs/Pleura: Left perihilar upper lobe masslike opacity. This measures 6.6 x 5.0 x 6.1 cm, measured on series 7, image 46 and series 8, image 55. Margins are irregular and spiculated with linear extension to the periphery of the pleural surface of the left upper lobe. There are small adjacent satellite nodules. Separate left upper lobe 3 mm nodule series 7, image 32. Irregular opacity at the  left lung apex which appears triangular measuring 11 mm, series 7, image 21. Biapical pleuroparenchymal scarring, some of which is calcified. Bilateral calcified pleural plaques. Multiple calcified granuloma in the right lower lobe. Severe narrowing of the left upper lobe bronchus due to in left perihilar mass. No pleural effusion. Upper Abdomen: Innumerable low-density lesions within the liver, largest lesions appear to be simple fluid density. Previous abdominal MRI 06/22/2019 demonstrated multiple hepatic cysts. No discrete adrenal nodule. Musculoskeletal: Slight flattening of mid thoracic vertebra. No destructive lytic or focal blastic osseous lesion. Review of the MIP images confirms the above findings. IMPRESSION: 1. No pulmonary embolus. 2. Left perihilar upper lobe masslike opacity measuring 6.6 x 5.0 x 6.1 cm, contiguous with the hilum. Margins are irregular and spiculated with linear extension to the periphery of the left upper lobe. Findings are highly suspicious for primary bronchogenic malignancy. 3. Perihilar mass is contiguous with the hilum, however there appears to be a discrete left hilar adenopathy. Prominent left lower  paratracheal nodes, and a nonspecific 9 mm right hilar node. 4. Separate 3 mm left upper lobe nodule. Irregular opacity at the left lung apex appears triangular measuring 11 mm, nonspecific but may be scarring. 5. Severe narrowing of the left upper lobe bronchus due to left perihilar mass. 6. Innumerable low-density lesions within the liver, largest lesions appear to be simple fluid density. These were previously characterized as hepatic cysts on MRI, although not well assessed on the current exam due to phase of contrast. 7. 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 Aortic Atherosclerosis (ICD10-I70.0). Electronically Signed   By: Keith Rake M.D.   On: 11/09/2021 18:02   MR BRAIN W WO CONTRAST  Result Date: 11/10/2021 CLINICAL DATA:  Metastatic  disease evaluation, lung mass EXAM: MRI HEAD WITHOUT AND WITH CONTRAST TECHNIQUE: Multiplanar, multiecho pulse sequences of the brain and surrounding structures were obtained without and with intravenous contrast. CONTRAST:  35mL GADAVIST GADOBUTROL 1 MMOL/ML IV SOLN COMPARISON:  None. FINDINGS: Brain: There is no acute infarction or intracranial hemorrhage. There is no intracranial mass, mass effect, or edema. There is no hydrocephalus or extra-axial fluid collection. Prominence of the ventricles and sulci reflects parenchymal volume loss. Patchy T2 hyperintensity in the supratentorial white matter is nonspecific but may reflect minor chronic microvascular ischemic changes. No abnormal enhancement. Vascular: Major vessel flow voids at the skull base are preserved. Skull and upper cervical spine: Normal marrow signal is preserved. Sinuses/Orbits: Paranasal sinus mucosal thickening. Orbits are unremarkable. Other: Sella is unremarkable.  Mastoid air cells are clear. IMPRESSION: No evidence of intracranial metastatic disease. Electronically Signed   By: Macy Mis M.D.   On: 11/10/2021 14:37   CT ABDOMEN PELVIS W CONTRAST  Result Date: 11/11/2021 CLINICAL DATA:  Non-small cell lung cancer staging. EXAM: CT ABDOMEN AND PELVIS WITH CONTRAST TECHNIQUE: Multidetector CT imaging of the abdomen and pelvis was performed using the standard protocol following bolus administration of intravenous contrast. RADIATION DOSE REDUCTION: This exam was performed according to the departmental dose-optimization program which includes automated exposure control, adjustment of the mA and/or kV according to patient size and/or use of iterative reconstruction technique. CONTRAST:  73mL OMNIPAQUE IOHEXOL 300 MG/ML  SOLN COMPARISON:  Chest CTA 11/09/2021, abdominal MRI 06/22/2019 and abdominal ultrasound 01/02/2018. FINDINGS: Lower chest: Calcified pleural plaque, pleural thickening and linear scarring at both lung bases. The known left  hilar mass is not imaged on this abdominal CT. No significant pleural or pericardial effusion. Aortic and coronary artery atherosclerosis are noted. Hepatobiliary: Numerous hepatic cysts are again noted, similar to previous MRI. No suspicious liver lesions are identified. There is no significant biliary dilatation status post cholecystectomy. Pancreas: Unremarkable. No pancreatic ductal dilatation or surrounding inflammatory changes. Spleen: Normal in size without focal abnormality. Adrenals/Urinary Tract: Both adrenal glands appear normal. Small bilateral renal cysts. No evidence of renal mass, urinary tract calculus or hydronephrosis. The bladder appears unremarkable. Stomach/Bowel: Enteric contrast was administered and has passed into the rectum. The stomach appears unremarkable for its degree of distension. No evidence of bowel wall thickening, distention or surrounding inflammatory change. The appendix appears normal. Moderate stool throughout the colon. Vascular/Lymphatic: There are no enlarged abdominal or pelvic lymph nodes. Diffuse aortic and branch vessel atherosclerosis. No evidence of aneurysm or large vessel occlusion. Reproductive: Multiple brachytherapy seeds are present within the prostate gland which otherwise appears unremarkable. Other: No ascites or peritoneal nodularity. Musculoskeletal: There is a lytic destructive mass within the right iliac bone with an associated soft tissue mass,  highly suspicious for metastatic disease. This measures 3.7 x 3.2 cm on image 59/2. No other suspicious osseous findings are seen. There are degenerative changes throughout the lumbar spine which contribute to mild spinal stenosis and foraminal narrowing at the lower 4 disc space levels. IMPRESSION: 1. 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 As this patient also has a remote history of prostate cancer, correlation with serum PSA levels  recommended, although there are no blastic lesions. 2. No other evidence of metastatic disease in the abdomen or pelvis. 3. Innumerable hepatic cysts, similar to previous MRI. Small renal cysts. 4. Multilevel lumbar spondylosis. 5. Aortic Atherosclerosis (ICD10-I70.0) and Emphysema (ICD10-J43.9). Electronically Signed   By: Richardean Sale M.D.   On: 11/11/2021 09:10   Telemetry    RSR - Personally Reviewed  Cardiac Studies   2D Echocardiogram 01.24.2023   1. Left ventricular ejection fraction, by estimation, is 55 to 60%. The  left ventricle has normal function. The left ventricle has no regional  wall motion abnormalities. Left ventricular diastolic parameters are  consistent with Grade I diastolic  dysfunction (impaired relaxation). The average left ventricular global  longitudinal strain is -18.0 %. The global longitudinal strain is normal.   2. Right ventricular systolic function is normal. The right ventricular  size is normal.   3. The mitral valve is normal in structure. No evidence of mitral valve  regurgitation. No evidence of mitral stenosis.   4. The aortic valve was not well visualized. Aortic valve regurgitation  is not visualized. No aortic stenosis is present.   5. The inferior vena cava is normal in size with greater than 50%  respiratory variability, suggesting right atrial pressure of 3 mmHg.  _____________   Patient Profile     81 y.o. male w/ a h/o prostate cancer and sleep apnea, who presented 1/23 w/ a 6-12 month h/o cough, peri-prandial abd pain/nausea, weakness, and findings of elevated HsTrop and L lung mass.  Assessment & Plan    1.  Elevated HsTroponin/Demand Ischemia:  ~ 1 yr h/o productive cough and peri-prandial abd discomfort associated w/ nausea.  He has no h/o chest pain or dyspnea, but is very sedentary.  Recently lost his wife of 69 yrs in December and has been grieving.  In the setting of all of the above, he has not been eating and has lost about  30 lbs.  Due to ongoing symptoms of cough, abd discomfort, and nausea, he presented to the ED on 1/23, and was found to have an elevated hsTrop of 410  491  407  382.  ECG w/ mild inferolateral ST elevation and prior septal infarct w/ TWI, similar to old ECGs.  Echo 1/24 w/ EF of 55-60% and w/o rwma or significant valvular dzs.  Pt reported difficulty breathing last night assoc w/ nausea and abd pain. VSS at the time and Ss resolved after receiving morphine and phenergan.  In light of reassuring findings on echo, HsTroponin elevations do not appear to represent ACS.  Pt w/ new diagnosis of lung mass pending biopsy.  As procedure is low-risk, and necessary for further diagnosis of lung mass, we do not feel that ischemic testing is warranted at this time, as it will not change plan to proceed with biopsy.  We will reconsider ischemic eval in the outpt setting if appropriate.  I will d/c heparin.  Cont asa (held for bx), statin, and low-dose ? blocker therapy.  2.  L Lung Mass/Nodules: w/ lytic lesion in R iliac bone.  Seen by oncology w/ plan for bx and eventual chemoradiation.  Signed, Murray Hodgkins, NP  11/11/2021, 10:04 AM    For questions or updates, please contact   Please consult [email or web address] for contact info under Cardiology/STEMI.

## 2021-11-11 NOTE — Consult Note (Signed)
ANTICOAGULATION CONSULT NOTE   Pharmacy Consult for heparin Indication: chest pain/ACS  No Known Allergies  Patient Measurements: Height: 5\' 9"  (175.3 cm) Weight: 61.2 kg (135 lb) IBW/kg (Calculated) : 70.7 Heparin Dosing Weight: 61.2 kg   Vital Signs: Temp: 97.7 F (36.5 C) (01/24 2121) Temp Source: Oral (01/24 2121) BP: 112/76 (01/24 2121) Pulse Rate: 73 (01/24 2121)  Labs: Recent Labs    11/09/21 1536 11/09/21 1734 11/09/21 2358 11/10/21 0143 11/10/21 1336 11/10/21 2351  HGB 14.1  --   --  13.2  --   --   HCT 42.0  --   --  38.9*  --   --   PLT 215  --   --  199  --   --   HEPARINUNFRC  --   --   --  0.12* 0.25* 0.26*  CREATININE 0.72  --   --   --   --   --   TROPONINIHS 410* 491* 407* 382*  --   --      Estimated Creatinine Clearance: 61.6 mL/min (by C-G formula based on SCr of 0.72 mg/dL).   Medical History: Past Medical History:  Diagnosis Date   Abnormal ECG    a. baseline inferolateral ST elevation dating back to at least 2012.   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 21 capsule 0    ipratropium (ATROVENT) 0.06 % nasal spray Place 2 sprays into both nostrils 4 (four) times daily. 15 mL 12    Scheduled:  Infusions:  PRN:  Anti-infectives (From admission, onward)    None       Assessment: Pharmacy consulted to start heparin for ACS. Elevated trop. No DOAC PTA.   Goal of Therapy:  Heparin level 0.3-0.7 units/ml Monitor platelets by anticoagulation protocol: Yes    Plan:  Heparin level remains subtherapeutic despite recent rate  increase: Bolus 1000 units x 1, then increase heparin rate to 1250 units/hr Recheck heparin level in 8 hrs after rate change CBC daily while on heparin  Renda Rolls, PharmD, [facility] 11/11/2021 1:08 AM

## 2021-11-11 NOTE — Progress Notes (Signed)
Chaplain Maggie made initial visit per spiritual consult for Advanced Directive education. Pt and family were present during the visit. Pt received AD paperwork and will contact nurse if/when he is ready to complete signatures. Contact on call chaplain to coordinate notary and witness signatures.

## 2021-11-11 NOTE — Consult Note (Signed)
[place] at [facility] [phone]) [phone] [phone]   Name: Jared Mullen Date: 11/11/2021 MRN: [id number]  DOB: [birth year]  Patient Care Team: Birdie Sons, MD as PCP - General (Family Medicine) Rockey Situ Kathlene November, MD as PCP - Cardiology (Cardiology) Franchot Gallo, MD as Consulting Physician (Urology)    REASON FOR CONSULTATION: Jared Mullen is a 81 y.o. male with multiple medical problems including history of prostate cancer, sleep apnea, admitted to the hospital 11/10/2021 for evaluation of progressive weakness and weight loss.  CTA of the chest revealed a left upper lobe lung mass.  CT of the abdomen and pelvis revealed a lytic lesion to the right iliac crest.  Palliative care was consulted to help address goals.  SOCIAL HISTORY:     reports that he quit smoking about 41 years ago. His smoking use included cigarettes. He has a 56.00 pack-year smoking history. He has never used smokeless tobacco. He reports that he does not drink alcohol and does not use drugs.  Patient is recently a widower after being married for 60 years.  Patient's wife died 10/07/21 at the [facility].  Patient has 3 daughters who are involved in his care.  Patient previously worked as a Banker for [employer].  ADVANCE DIRECTIVES:  Not on file  CODE STATUS: Full code  PAST MEDICAL HISTORY: Past Medical History:  Diagnosis Date   Abnormal ECG    a. baseline inferolateral ST elevation dating back to at least 2012.   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   Sleep apnea    Urinary frequency    Weak urinary stream    Weight loss     PAST SURGICAL HISTORY:  Past Surgical History:  Procedure Laterality Date   biospy  09/30/2011-   TRUS/Bx Prostate - 5/12/biopsies positive  for cancer, glandular size 32.5 cc   CHOLECYSTECTOMY  1992   RADIOACTIVE SEED IMPLANT  02/09/2012   Procedure: RADIOACTIVE SEED IMPLANT;  Surgeon: Franchot Gallo, MD;  Location: [facility];  Service: Urology;  Laterality: N/A;  RAD TECH OK PER ANNE AT MAIN OR    UVULOPALATOPHARYNGOPLASTY      HEMATOLOGY/ONCOLOGY HISTORY:  Oncology History   No history exists.    ALLERGIES:  has No Known Allergies.  MEDICATIONS:  Current Facility-Administered Medications  Medication Dose Route Frequency Provider Last Rate Last Admin   acetaminophen (TYLENOL) tablet 650 mg  650 mg Oral Q6H PRN Para Skeans, MD   650 mg at 11/10/21 1533   Or   acetaminophen (TYLENOL) suppository 650 mg  650 mg Rectal Q6H PRN Para Skeans, MD       atorvastatin (LIPITOR) tablet 80 mg  80 mg Oral Daily Theora Gianotti, NP   80 mg at 11/11/21 1032   feeding supplement (ENSURE ENLIVE / ENSURE PLUS) liquid 237 mL  237 mL Oral TID BM Loletha Grayer, MD   237 mL at 11/10/21 1904   hydrALAZINE (APRESOLINE) injection 5 mg  5 mg Intravenous Q6H PRN Para Skeans, MD       melatonin tablet 5 mg  5 mg Oral QHS Florina Ou V, MD   5 mg at 11/10/21 2200   metoprolol tartrate (LOPRESSOR) tablet 12.5 mg  12.5  mg Oral BID Theora Gianotti, NP   12.5 mg at 11/11/21 1040   morphine 2 MG/ML injection 1 mg  1 mg Intravenous Q6H PRN Para Skeans, MD   1 mg at 11/11/21 0034   multivitamin with minerals tablet 1 tablet  1 tablet Oral Daily Loletha Grayer, MD   1 tablet at 11/11/21 1032   nitroGLYCERIN (NITROSTAT) SL tablet 0.4 mg  0.4 mg Sublingual Q5 min PRN Para Skeans, MD       ondansetron [facility]) injection 4 mg  4 mg Intravenous Q6H PRN Loletha Grayer, MD   4 mg at 11/10/21 1904   pantoprazole (PROTONIX) injection 40 mg  40 mg Intravenous Q12H Florina Ou V, MD   40 mg at 11/11/21 1032    VITAL SIGNS: BP 127/73    Pulse 80    Temp 97.7 F (36.5 C)    Resp 19    Ht 5' 9"  (1.753 m)    Wt 135  lb (61.2 kg)    SpO2 98%    BMI 19.94 kg/m  Filed Weights   11/09/21 1139 11/09/21 2110  Weight: 135 lb (61.2 kg) 135 lb (61.2 kg)    Estimated body mass index is 19.94 kg/m as calculated from the following:   Height as of this encounter: 5' 9"  (1.753 m).   Weight as of this encounter: 135 lb (61.2 kg).  LABS: CBC:    Component Value Date/Time   WBC 8.1 11/11/2021 0505   HGB 12.5 (L) 11/11/2021 0505   HGB 15.1 11/02/2021 0954   HCT 37.5 (L) 11/11/2021 0505   HCT 44.7 11/02/2021 0954   PLT 184 11/11/2021 0505   PLT 222 11/02/2021 0954   MCV 88.7 11/11/2021 0505   MCV 90 11/02/2021 0954   NEUTROABS 5.6 11/09/2021 1536   LYMPHSABS 0.9 11/09/2021 1536   MONOABS 1.0 11/09/2021 1536   EOSABS 0.1 11/09/2021 1536   BASOSABS 0.0 11/09/2021 1536   Comprehensive Metabolic Panel:    Component Value Date/Time   NA 135 11/11/2021 0505   NA 141 11/02/2021 0954   K 3.9 11/11/2021 0505   CL 100 11/11/2021 0505   CO2 27 11/11/2021 0505   BUN 16 11/11/2021 0505   BUN 17 11/02/2021 0954   CREATININE 0.72 11/11/2021 0505   GLUCOSE 112 (H) 11/11/2021 0505   CALCIUM 8.7 (L) 11/11/2021 0505   AST 17 11/09/2021 1536   ALT 13 11/09/2021 1536   ALKPHOS 71 11/09/2021 1536   BILITOT 1.2 11/09/2021 1536   BILITOT 0.6 11/02/2021 0954   PROT 7.4 11/09/2021 1536   PROT 7.0 11/02/2021 0954   ALBUMIN 3.5 11/09/2021 1536   ALBUMIN 3.8 11/02/2021 0954    RADIOGRAPHIC STUDIES: DG Chest 2 View  Result Date: 11/09/2021 CLINICAL DATA:  Nausea, belching decreased appetite for 2 months, history prostate cancer EXAM: CHEST - 2 VIEW COMPARISON:  03/07/2017 rib radiographs, chest radiograph 11/16/2016 FINDINGS: Normal heart size, mediastinal contours, and pulmonary vascularity. Small calcifications in the chest bilaterally again identified likely calcified pleural plaques. New LEFT perihilar mass 6.6 x 6.4 cm highly suspicious for pulmonary neoplasm. No acute infiltrate, pleural effusion or pneumothorax.  No definite acute bone lesions. IMPRESSION: New LEFT perihilar mass 6.6 x 6.4 cm highly suspicious for a pulmonary neoplasm; CT chest with contrast recommended for further evaluation. Calcifications in the chest bilaterally most consistent with calcified pleural plaques, unchanged, question prior asbestos exposure. Aortic Atherosclerosis (ICD10-I70.0). Electronically Signed   By: Crist Infante.D.  On: 11/09/2021 17:09   CT Angio Chest PE W/Cm &/Or Wo Cm  Result Date: 11/09/2021 CLINICAL DATA:  Pulmonary embolism (PE) suspected, high prob Two month history of nausea and decreased appetite. Symptoms after patient's wife done 2 months ago. EXAM: CT ANGIOGRAPHY CHEST WITH CONTRAST TECHNIQUE: Multidetector CT imaging of the chest was performed using the standard protocol during bolus administration of intravenous contrast. Multiplanar CT image reconstructions and MIPs were obtained to evaluate the vascular anatomy. RADIATION DOSE REDUCTION: This exam was performed according to the departmental dose-optimization program which includes automated exposure control, adjustment of the mA and/or kV according to patient size and/or use of iterative reconstruction technique. CONTRAST:  38m OMNIPAQUE IOHEXOL 350 MG/ML SOLN COMPARISON:  Chest radiograph earlier today. FINDINGS: Cardiovascular: There are no filling defects within the pulmonary arteries to suggest pulmonary embolus. The lingular pulmonary arteries are attenuated due to left perihilar mass. Atherosclerosis of the thoracic aorta. No aortic aneurysm or acute aortic findings. The heart is normal in size. There is no pericardial effusion. Mediastinum/Nodes: Left upper lobe perihilar mass is contiguous with the hilum. There is likely separate suprahilar adenopathy within 18 mm lymph node series 5, image 46. Likely infrahilar lymph node measuring 2.4 cm series 5, image 58. 9 mm right hilar node. Occasional prominent left lower paratracheal nodes, including a 10 mm  node series 5, image 38. No definite supraclavicular adenopathy. No visualized thyroid nodule. Upper esophagus is patulous. No esophageal wall thickening. Lungs/Pleura: Left perihilar upper lobe masslike opacity. This measures 6.6 x 5.0 x 6.1 cm, measured on series 7, image 46 and series 8, image 55. Margins are irregular and spiculated with linear extension to the periphery of the pleural surface of the left upper lobe. There are small adjacent satellite nodules. Separate left upper lobe 3 mm nodule series 7, image 32. Irregular opacity at the left lung apex which appears triangular measuring 11 mm, series 7, image 21. Biapical pleuroparenchymal scarring, some of which is calcified. Bilateral calcified pleural plaques. Multiple calcified granuloma in the right lower lobe. Severe narrowing of the left upper lobe bronchus due to in left perihilar mass. No pleural effusion. Upper Abdomen: Innumerable low-density lesions within the liver, largest lesions appear to be simple fluid density. Previous abdominal MRI 06/22/2019 demonstrated multiple hepatic cysts. No discrete adrenal nodule. Musculoskeletal: Slight flattening of mid thoracic vertebra. No destructive lytic or focal blastic osseous lesion. Review of the MIP images confirms the above findings. IMPRESSION: 1. No pulmonary embolus. 2. Left perihilar upper lobe masslike opacity measuring 6.6 x 5.0 x 6.1 cm, contiguous with the hilum. Margins are irregular and spiculated with linear extension to the periphery of the left upper lobe. Findings are highly suspicious for primary bronchogenic malignancy. 3. Perihilar mass is contiguous with the hilum, however there appears to be a discrete left hilar adenopathy. Prominent left lower paratracheal nodes, and a nonspecific 9 mm right hilar node. 4. Separate 3 mm left upper lobe nodule. Irregular opacity at the left lung apex appears triangular measuring 11 mm, nonspecific but may be scarring. 5. Severe narrowing of the  left upper lobe bronchus due to left perihilar mass. 6. Innumerable low-density lesions within the liver, largest lesions appear to be simple fluid density. These were previously characterized as hepatic cysts on MRI, although not well assessed on the current exam due to phase of contrast. 7. 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 Aortic Atherosclerosis (ICD10-I70.0). Electronically Signed   By: MKeith RakeM.D.   On: 11/09/2021  18:02   MR BRAIN W WO CONTRAST  Result Date: 11/10/2021 CLINICAL DATA:  Metastatic disease evaluation, lung mass EXAM: MRI HEAD WITHOUT AND WITH CONTRAST TECHNIQUE: Multiplanar, multiecho pulse sequences of the brain and surrounding structures were obtained without and with intravenous contrast. CONTRAST:  52m GADAVIST GADOBUTROL 1 MMOL/ML IV SOLN COMPARISON:  None. FINDINGS: Brain: There is no acute infarction or intracranial hemorrhage. There is no intracranial mass, mass effect, or edema. There is no hydrocephalus or extra-axial fluid collection. Prominence of the ventricles and sulci reflects parenchymal volume loss. Patchy T2 hyperintensity in the supratentorial white matter is nonspecific but may reflect minor chronic microvascular ischemic changes. No abnormal enhancement. Vascular: Major vessel flow voids at the skull base are preserved. Skull and upper cervical spine: Normal marrow signal is preserved. Sinuses/Orbits: Paranasal sinus mucosal thickening. Orbits are unremarkable. Other: Sella is unremarkable.  Mastoid air cells are clear. IMPRESSION: No evidence of intracranial metastatic disease. Electronically Signed   By: PMacy MisM.D.   On: 11/10/2021 14:37   CT ABDOMEN PELVIS W CONTRAST  Result Date: 11/11/2021 CLINICAL DATA:  Non-small cell lung cancer staging. EXAM: CT ABDOMEN AND PELVIS WITH CONTRAST TECHNIQUE: Multidetector CT imaging of the abdomen and pelvis was performed using the standard protocol following bolus  administration of intravenous contrast. RADIATION DOSE REDUCTION: This exam was performed according to the departmental dose-optimization program which includes automated exposure control, adjustment of the mA and/or kV according to patient size and/or use of iterative reconstruction technique. CONTRAST:  842mOMNIPAQUE IOHEXOL 300 MG/ML  SOLN COMPARISON:  Chest CTA 11/09/2021, abdominal MRI 06/22/2019 and abdominal ultrasound 01/02/2018. FINDINGS: Lower chest: Calcified pleural plaque, pleural thickening and linear scarring at both lung bases. The known left hilar mass is not imaged on this abdominal CT. No significant pleural or pericardial effusion. Aortic and coronary artery atherosclerosis are noted. Hepatobiliary: Numerous hepatic cysts are again noted, similar to previous MRI. No suspicious liver lesions are identified. There is no significant biliary dilatation status post cholecystectomy. Pancreas: Unremarkable. No pancreatic ductal dilatation or surrounding inflammatory changes. Spleen: Normal in size without focal abnormality. Adrenals/Urinary Tract: Both adrenal glands appear normal. Small bilateral renal cysts. No evidence of renal mass, urinary tract calculus or hydronephrosis. The bladder appears unremarkable. Stomach/Bowel: Enteric contrast was administered and has passed into the rectum. The stomach appears unremarkable for its degree of distension. No evidence of bowel wall thickening, distention or surrounding inflammatory change. The appendix appears normal. Moderate stool throughout the colon. Vascular/Lymphatic: There are no enlarged abdominal or pelvic lymph nodes. Diffuse aortic and branch vessel atherosclerosis. No evidence of aneurysm or large vessel occlusion. Reproductive: Multiple brachytherapy seeds are present within the prostate gland which otherwise appears unremarkable. Other: No ascites or peritoneal nodularity. Musculoskeletal: There is a lytic destructive mass within the right  iliac bone with an associated soft tissue mass, highly suspicious for metastatic disease. This measures 3.7 x 3.2 cm on image 59/2. No other suspicious osseous findings are seen. There are degenerative changes throughout the lumbar spine which contribute to mild spinal stenosis and foraminal narrowing at the lower 4 disc space levels. IMPRESSION: 1. 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 As this patient also has a remote history of prostate cancer, correlation with serum PSA levels recommended, although there are no blastic lesions. 2. No other evidence of metastatic disease in the abdomen or pelvis. 3. Innumerable hepatic cysts, similar to previous MRI. Small renal cysts. 4. Multilevel  lumbar spondylosis. 5. Aortic Atherosclerosis (ICD10-I70.0) and Emphysema (ICD10-J43.9). Electronically Signed   By: Richardean Sale M.D.   On: 11/11/2021 09:10   ECHOCARDIOGRAM COMPLETE  Result Date: 11/10/2021    ECHOCARDIOGRAM REPORT   Patient Name:   Jared Mullen Date of Exam: 11/10/2021 Medical Rec #:  [id number]   Height:       69.0 in Accession #:    [id number]  Weight:       135.0 lb Date of Birth:  07/05/1941    BSA:          1.748 m Patient Age:    81 years    BP:           129/84 mmHg Patient Gender: M           HR:           78 bpm. Exam Location:  [facility] Procedure: 2D Echo, Color Doppler, Cardiac Doppler and Strain Analysis Indications:     R07.9 Chest Pain  History:         Patient has no prior history of Echocardiogram examinations.                  Risk Factors:Sleep Apnea.  Sonographer:     Charmayne Sheer Referring Phys:  [place] Diagnosing Phys: Ida Rogue MD  Sonographer Comments: Suboptimal parasternal window. Global longitudinal strain was attempted. IMPRESSIONS  1. Left ventricular ejection fraction, by estimation, is 55 to 60%. The left ventricle has normal function. The left ventricle has no regional wall motion abnormalities.  Left ventricular diastolic parameters are consistent with Grade I diastolic dysfunction (impaired relaxation). The average left ventricular global longitudinal strain is -18.0 %. The global longitudinal strain is normal.  2. Right ventricular systolic function is normal. The right ventricular size is normal.  3. The mitral valve is normal in structure. No evidence of mitral valve regurgitation. No evidence of mitral stenosis.  4. The aortic valve was not well visualized. Aortic valve regurgitation is not visualized. No aortic stenosis is present.  5. The inferior vena cava is normal in size with greater than 50% respiratory variability, suggesting right atrial pressure of 3 mmHg. FINDINGS  Left Ventricle: Left ventricular ejection fraction, by estimation, is 55 to 60%. The left ventricle has normal function. The left ventricle has no regional wall motion abnormalities. The average left ventricular global longitudinal strain is -18.0 %. The global longitudinal strain is normal. The left ventricular internal cavity size was normal in size. There is no left ventricular hypertrophy. Left ventricular diastolic parameters are consistent with Grade I diastolic dysfunction (impaired relaxation). Right Ventricle: The right ventricular size is normal. No increase in right ventricular wall thickness. Right ventricular systolic function is normal. Left Atrium: Left atrial size was normal in size. Right Atrium: Right atrial size was normal in size. Pericardium: There is no evidence of pericardial effusion. Mitral Valve: The mitral valve is normal in structure. No evidence of mitral valve regurgitation. No evidence of mitral valve stenosis. MV peak gradient, 2.4 mmHg. The mean mitral valve gradient is 1.0 mmHg. Tricuspid Valve: The tricuspid valve is normal in structure. Tricuspid valve regurgitation is not demonstrated. No evidence of tricuspid stenosis. Aortic Valve: The aortic valve was not well visualized. Aortic valve  regurgitation is not visualized. No aortic stenosis is present. Aortic valve mean gradient measures 3.0 mmHg. Aortic valve peak gradient measures 5.4 mmHg. Aortic valve area, by VTI measures 2.93 cm. Pulmonic Valve: The pulmonic valve was  normal in structure. Pulmonic valve regurgitation is not visualized. No evidence of pulmonic stenosis. Aorta: The aortic root is normal in size and structure. Venous: The inferior vena cava is normal in size with greater than 50% respiratory variability, suggesting right atrial pressure of 3 mmHg. IAS/Shunts: No atrial level shunt detected by color flow Doppler.  LEFT VENTRICLE PLAX 2D LVIDd:         4.03 cm   Diastology LVIDs:         2.25 cm   LV e' medial:    6.09 cm/s LV PW:         0.96 cm   LV E/e' medial:  11.1 LV IVS:        0.70 cm   LV e' lateral:   11.00 cm/s LVOT diam:     2.10 cm   LV E/e' lateral: 6.2 LV SV:         56 LV SV Index:   32        2D Longitudinal Strain LVOT Area:     3.46 cm  2D Strain GLS Avg:     -18.0 %  RIGHT VENTRICLE RV Basal diam:  3.68 cm LEFT ATRIUM             Index        RIGHT ATRIUM           Index LA diam:        2.70 cm 1.54 cm/m   RA Area:     13.10 cm LA Vol (A2C):   37.0 ml 21.16 ml/m  RA Volume:   31.20 ml  17.85 ml/m LA Vol (A4C):   25.1 ml 14.36 ml/m LA Biplane Vol: 31.8 ml 18.19 ml/m  AORTIC VALVE                    PULMONIC VALVE AV Area (Vmax):    2.77 cm     PV Vmax:       0.61 m/s AV Area (Vmean):   2.55 cm     PV Vmean:      41.500 cm/s AV Area (VTI):     2.93 cm     PV VTI:        0.105 m AV Vmax:           116.00 cm/s  PV Peak grad:  1.5 mmHg AV Vmean:          81.000 cm/s  PV Mean grad:  1.0 mmHg AV VTI:            0.190 m AV Peak Grad:      5.4 mmHg AV Mean Grad:      3.0 mmHg LVOT Vmax:         92.70 cm/s LVOT Vmean:        59.600 cm/s LVOT VTI:          0.161 m LVOT/AV VTI ratio: 0.85  AORTA Ao Root diam: 3.60 cm MITRAL VALVE MV Area (PHT): 3.97 cm    SHUNTS MV Area VTI:   2.77 cm    Systemic VTI:  0.16 m MV  Peak grad:  2.4 mmHg    Systemic Diam: 2.10 cm MV Mean grad:  1.0 mmHg MV Vmax:       0.78 m/s MV Vmean:      55.6 cm/s MV Decel Time: 191 msec MV E velocity: 67.70 cm/s MV A velocity: 81.80 cm/s MV E/A ratio:  0.83 Ida Rogue MD Electronically signed  by Ida Rogue MD Signature Date/Time: 11/10/2021/2:12:02 PM    Final     PERFORMANCE STATUS (ECOG) : 2 - Symptomatic, <50% confined to bed  Review of Systems Unless otherwise noted, a complete review of systems is negative.  Physical Exam General: NAD Pulmonary: Unlabored Extremities: no edema, no joint deformities Skin: no rashes Neurological: Weakness but otherwise nonfocal  IMPRESSION: I met with patient and his daughter, Jared Mullen.  Patient is aware that his work-up is suggestive of lung cancer.  He smoked for many years and says that the news is not entirely unexpected.  He is in agreement with pursuing work-up and would like to discuss treatment options.  Of note, patient's wife died last month.  She was transitioned to comfort care after being hospitalized with pancreatitis and ultimately died at the [place].  They have been married 48 years and patient was her primary caregiver due to a diagnosis of progressive supranuclear palsy.  Patient was tearful and is clearly and appropriately grieving the recent loss of his wife.  He has good social support from his three daughters, all of whom live nearby.  Symptomatically, he has some back pain.  CT was suggestive of lumbar spondylosis as well as patient was found to have a large lytic metastasis within the right iliac bone.  He has received a dose of IV morphine with good effect.  Will start patient on as needed Norco in anticipation of future discharge.  I discussed option of starting patient on an antidepressant such as mirtazapine given his self-report of depressive symptoms, insomnia, and poor appetite.  Patient wanted to think about this option.  I would also recommend that he  establish care with [facility], in the [place] for therapeutic support.  Patient has a living will, which he will bring to the clinic to be scanned into the chart.  He would want all of his daughters to be involved in decision-making if necessary.  Patient wants to remain a full code for now but is willing to have future conversations as his cancer work-up/treatment progresses.  He does state that he is "ready when the [facility] takes me."  PLAN: -Continue current scope of treatment -Start Norco 5-325 mg every 6 hours as needed for pain -Daily bowel regimen -Chaplain consult -Will plan to follow patient in the [place] after discharge from the hospital  Case and plan discussed with Dr. Janese Banks  Time Total: 60 minutes  Visit consisted of counseling and education dealing with the complex and emotionally intense issues of symptom management and palliative care in the setting of serious and potentially life-threatening illness.Greater than 50%  of this time was spent counseling and coordinating care related to the above assessment and plan.  Signed by: Altha Harm, PhD, NP-C

## 2021-11-11 NOTE — Consult Note (Signed)
PULMONOLOGY         Date: 11/11/2021,   MRN# [id number] Jared Mullen [birth year]     AdmissionWeight: 61.2 kg                 CurrentWeight: 61.2 kg   Referring physician: Dr Jimmye Norman   CHIEF COMPLAINT:   Left lung mass   HISTORY OF PRESENT ILLNESS   Patient is an 81 yo M with hx of smoking from age 29 to 31 and has not smoked since then.  He has had cough for past 1 yr. He does admit to worsening cough and fatigue. He has left lung mass and we reviewed CT chest together today.   He underwent CT angio chest which showed left perihilar upper lobe masslike opacity measuring 6.6 x 5 x 6.1 cm contiguous with the hilum.  Margins are irregular and spiculated with linear extension into the periphery of the left upper lobe.  Findings concerning for primary bronchogenic malignancy.    PAST MEDICAL HISTORY   Past Medical History:  Diagnosis Date   Abnormal ECG    a. baseline inferolateral ST elevation dating back to at least 2012.   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   Sleep apnea    Urinary frequency    Weak urinary stream    Weight loss      SURGICAL HISTORY   Past Surgical History:  Procedure Laterality Date   biospy  09/30/2011-   TRUS/Bx Prostate - 5/12/biopsies positive for cancer, glandular size 32.5 cc   CHOLECYSTECTOMY  1992   RADIOACTIVE SEED IMPLANT  02/09/2012   Procedure: RADIOACTIVE SEED IMPLANT;  Surgeon: Franchot Gallo, MD;  Location: [facility];  Service: Urology;  Laterality: N/A;  RAD TECH OK PER ANNE AT MAIN OR    UVULOPALATOPHARYNGOPLASTY       FAMILY HISTORY   Family History  Problem Relation Age of Onset   Heart disease Father    Breast cancer Sister    Cancer Sister        lung   Lung cancer Brother    Cancer Brother        bladder   Lung cancer Sister    Cancer Sister         breast   Kidney disease Brother      SOCIAL HISTORY   Social History   Tobacco Use   Smoking status: Former    Packs/day: 2.00    Years: 28.00    Pack years: 56.00    Types: Cigarettes    Quit date: 10/18/1980    Years since quitting: 41.0   Smokeless tobacco: Never  Substance Use Topics   Alcohol use: No   Drug use: No     MEDICATIONS    Home Medication:    Current Medication:  Current Facility-Administered Medications:    acetaminophen (TYLENOL) tablet 650 mg, 650 mg, Oral, Q6H PRN, 650 mg at 11/10/21 1533 **OR** acetaminophen (TYLENOL) suppository 650 mg, 650 mg, Rectal, Q6H PRN, Para Skeans, MD   atorvastatin (LIPITOR) tablet 80 mg, 80 mg, Oral, Daily, Theora Gianotti, NP, 80 mg at 11/11/21 1032   feeding supplement (ENSURE ENLIVE / ENSURE PLUS) liquid 237 mL, 237 mL, Oral, TID BM, Loletha Grayer, MD, 237 mL at 11/10/21 1904  hydrALAZINE (APRESOLINE) injection 5 mg, 5 mg, Intravenous, Q6H PRN, Para Skeans, MD   melatonin tablet 5 mg, 5 mg, Oral, QHS, Florina Ou V, MD, 5 mg at 11/10/21 2200   metoprolol tartrate (LOPRESSOR) tablet 12.5 mg, 12.5 mg, Oral, BID, Theora Gianotti, NP, 12.5 mg at 11/11/21 1040   morphine 2 MG/ML injection 1 mg, 1 mg, Intravenous, Q6H PRN, Para Skeans, MD, 1 mg at 11/11/21 0034   multivitamin with minerals tablet 1 tablet, 1 tablet, Oral, Daily, Wieting, Richard, MD, 1 tablet at 11/11/21 1032   nitroGLYCERIN (NITROSTAT) SL tablet 0.4 mg, 0.4 mg, Sublingual, Q5 min PRN, Para Skeans, MD   ondansetron [facility]) injection 4 mg, 4 mg, Intravenous, Q6H PRN, Loletha Grayer, MD, 4 mg at 11/10/21 1904   pantoprazole (PROTONIX) injection 40 mg, 40 mg, Intravenous, Q12H, Para Skeans, MD, 40 mg at 11/11/21 1032    ALLERGIES   Patient has no known allergies.     REVIEW OF SYSTEMS    Review of Systems:  Gen:  Denies  fever, sweats, chills weigh loss  HEENT: Denies blurred vision, double vision, ear pain,  eye pain, hearing loss, nose bleeds, sore throat Cardiac:  No dizziness, chest pain or heaviness, chest tightness,edema Resp:   Denies cough or sputum porduction, shortness of breath,wheezing, hemoptysis,  Gi: Denies swallowing difficulty, stomach pain, nausea or vomiting, diarrhea, constipation, bowel incontinence Gu:  Denies bladder incontinence, burning urine Ext:   Denies Joint pain, stiffness or swelling Skin: Denies  skin rash, easy bruising or bleeding or hives Endoc:  Denies polyuria, polydipsia , polyphagia or weight change Psych:   Denies depression, insomnia or hallucinations   Other:  All other systems negative   VS: BP 127/73    Pulse 80    Temp 97.7 F (36.5 C)    Resp 19    Ht 5\' 9"  (1.753 m)    Wt 61.2 kg    SpO2 98%    BMI 19.94 kg/m      PHYSICAL EXAM    GENERAL:NAD, no fevers, chills, no weakness no fatigue HEAD: Normocephalic, atraumatic.  EYES: Pupils equal, round, reactive to light. Extraocular muscles intact. No scleral icterus.  MOUTH: Moist mucosal membrane. Dentition intact. No abscess noted.  EAR, NOSE, THROAT: Clear without exudates. No external lesions.  NECK: Supple. No thyromegaly. No nodules. No JVD.  PULMONARY: Diffuse coarse rhonchi right sided +wheezes CARDIOVASCULAR: S1 and S2. Regular rate and rhythm. No murmurs, rubs, or gallops. No edema. Pedal pulses 2+ bilaterally.  GASTROINTESTINAL: Soft, nontender, nondistended. No masses. Positive bowel sounds. No hepatosplenomegaly.  MUSCULOSKELETAL: No swelling, clubbing, or edema. Range of motion full in all extremities.  NEUROLOGIC: Cranial nerves II through XII are intact. No gross focal neurological deficits. Sensation intact. Reflexes intact.  SKIN: No ulceration, lesions, rashes, or cyanosis. Skin warm and dry. Turgor intact.  PSYCHIATRIC: Mood, affect within normal limits. The patient is awake, alert and oriented x 3. Insight, judgment intact.       IMAGING    DG Chest 2 View  Result  Date: 11/09/2021 CLINICAL DATA:  Nausea, belching decreased appetite for 2 months, history prostate cancer EXAM: CHEST - 2 VIEW COMPARISON:  03/07/2017 rib radiographs, chest radiograph 11/16/2016 FINDINGS: Normal heart size, mediastinal contours, and pulmonary vascularity. Small calcifications in the chest bilaterally again identified likely calcified pleural plaques. New LEFT perihilar mass 6.6 x 6.4 cm highly suspicious for pulmonary neoplasm. No acute infiltrate, pleural effusion or pneumothorax. No definite acute  bone lesions. IMPRESSION: New LEFT perihilar mass 6.6 x 6.4 cm highly suspicious for a pulmonary neoplasm; CT chest with contrast recommended for further evaluation. Calcifications in the chest bilaterally most consistent with calcified pleural plaques, unchanged, question prior asbestos exposure. Aortic Atherosclerosis (ICD10-I70.0). Electronically Signed   By: Lavonia Dana M.D.   On: 11/09/2021 17:09   CT Angio Chest PE W/Cm &/Or Wo Cm  Result Date: 11/09/2021 CLINICAL DATA:  Pulmonary embolism (PE) suspected, high prob Two month history of nausea and decreased appetite. Symptoms after patient's wife done 2 months ago. EXAM: CT ANGIOGRAPHY CHEST WITH CONTRAST TECHNIQUE: Multidetector CT imaging of the chest was performed using the standard protocol during bolus administration of intravenous contrast. Multiplanar CT image reconstructions and MIPs were obtained to evaluate the vascular anatomy. RADIATION DOSE REDUCTION: This exam was performed according to the departmental dose-optimization program which includes automated exposure control, adjustment of the mA and/or kV according to patient size and/or use of iterative reconstruction technique. CONTRAST:  74mL OMNIPAQUE IOHEXOL 350 MG/ML SOLN COMPARISON:  Chest radiograph earlier today. FINDINGS: Cardiovascular: There are no filling defects within the pulmonary arteries to suggest pulmonary embolus. The lingular pulmonary arteries are attenuated  due to left perihilar mass. Atherosclerosis of the thoracic aorta. No aortic aneurysm or acute aortic findings. The heart is normal in size. There is no pericardial effusion. Mediastinum/Nodes: Left upper lobe perihilar mass is contiguous with the hilum. There is likely separate suprahilar adenopathy within 18 mm lymph node series 5, image 46. Likely infrahilar lymph node measuring 2.4 cm series 5, image 58. 9 mm right hilar node. Occasional prominent left lower paratracheal nodes, including a 10 mm node series 5, image 38. No definite supraclavicular adenopathy. No visualized thyroid nodule. Upper esophagus is patulous. No esophageal wall thickening. Lungs/Pleura: Left perihilar upper lobe masslike opacity. This measures 6.6 x 5.0 x 6.1 cm, measured on series 7, image 46 and series 8, image 55. Margins are irregular and spiculated with linear extension to the periphery of the pleural surface of the left upper lobe. There are small adjacent satellite nodules. Separate left upper lobe 3 mm nodule series 7, image 32. Irregular opacity at the left lung apex which appears triangular measuring 11 mm, series 7, image 21. Biapical pleuroparenchymal scarring, some of which is calcified. Bilateral calcified pleural plaques. Multiple calcified granuloma in the right lower lobe. Severe narrowing of the left upper lobe bronchus due to in left perihilar mass. No pleural effusion. Upper Abdomen: Innumerable low-density lesions within the liver, largest lesions appear to be simple fluid density. Previous abdominal MRI 06/22/2019 demonstrated multiple hepatic cysts. No discrete adrenal nodule. Musculoskeletal: Slight flattening of mid thoracic vertebra. No destructive lytic or focal blastic osseous lesion. Review of the MIP images confirms the above findings. IMPRESSION: 1. No pulmonary embolus. 2. Left perihilar upper lobe masslike opacity measuring 6.6 x 5.0 x 6.1 cm, contiguous with the hilum. Margins are irregular and  spiculated with linear extension to the periphery of the left upper lobe. Findings are highly suspicious for primary bronchogenic malignancy. 3. Perihilar mass is contiguous with the hilum, however there appears to be a discrete left hilar adenopathy. Prominent left lower paratracheal nodes, and a nonspecific 9 mm right hilar node. 4. Separate 3 mm left upper lobe nodule. Irregular opacity at the left lung apex appears triangular measuring 11 mm, nonspecific but may be scarring. 5. Severe narrowing of the left upper lobe bronchus due to left perihilar mass. 6. Innumerable low-density lesions within the liver,  largest lesions appear to be simple fluid density. These were previously characterized as hepatic cysts on MRI, although not well assessed on the current exam due to phase of contrast. 7. 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 Aortic Atherosclerosis (ICD10-I70.0). Electronically Signed   By: Keith Rake M.D.   On: 11/09/2021 18:02   MR BRAIN W WO CONTRAST  Result Date: 11/10/2021 CLINICAL DATA:  Metastatic disease evaluation, lung mass EXAM: MRI HEAD WITHOUT AND WITH CONTRAST TECHNIQUE: Multiplanar, multiecho pulse sequences of the brain and surrounding structures were obtained without and with intravenous contrast. CONTRAST:  57mL GADAVIST GADOBUTROL 1 MMOL/ML IV SOLN COMPARISON:  None. FINDINGS: Brain: There is no acute infarction or intracranial hemorrhage. There is no intracranial mass, mass effect, or edema. There is no hydrocephalus or extra-axial fluid collection. Prominence of the ventricles and sulci reflects parenchymal volume loss. Patchy T2 hyperintensity in the supratentorial white matter is nonspecific but may reflect minor chronic microvascular ischemic changes. No abnormal enhancement. Vascular: Major vessel flow voids at the skull base are preserved. Skull and upper cervical spine: Normal marrow signal is preserved. Sinuses/Orbits: Paranasal sinus mucosal  thickening. Orbits are unremarkable. Other: Sella is unremarkable.  Mastoid air cells are clear. IMPRESSION: No evidence of intracranial metastatic disease. Electronically Signed   By: Macy Mis M.D.   On: 11/10/2021 14:37   CT ABDOMEN PELVIS W CONTRAST  Result Date: 11/11/2021 CLINICAL DATA:  Non-small cell lung cancer staging. EXAM: CT ABDOMEN AND PELVIS WITH CONTRAST TECHNIQUE: Multidetector CT imaging of the abdomen and pelvis was performed using the standard protocol following bolus administration of intravenous contrast. RADIATION DOSE REDUCTION: This exam was performed according to the departmental dose-optimization program which includes automated exposure control, adjustment of the mA and/or kV according to patient size and/or use of iterative reconstruction technique. CONTRAST:  72mL OMNIPAQUE IOHEXOL 300 MG/ML  SOLN COMPARISON:  Chest CTA 11/09/2021, abdominal MRI 06/22/2019 and abdominal ultrasound 01/02/2018. FINDINGS: Lower chest: Calcified pleural plaque, pleural thickening and linear scarring at both lung bases. The known left hilar mass is not imaged on this abdominal CT. No significant pleural or pericardial effusion. Aortic and coronary artery atherosclerosis are noted. Hepatobiliary: Numerous hepatic cysts are again noted, similar to previous MRI. No suspicious liver lesions are identified. There is no significant biliary dilatation status post cholecystectomy. Pancreas: Unremarkable. No pancreatic ductal dilatation or surrounding inflammatory changes. Spleen: Normal in size without focal abnormality. Adrenals/Urinary Tract: Both adrenal glands appear normal. Small bilateral renal cysts. No evidence of renal mass, urinary tract calculus or hydronephrosis. The bladder appears unremarkable. Stomach/Bowel: Enteric contrast was administered and has passed into the rectum. The stomach appears unremarkable for its degree of distension. No evidence of bowel wall thickening, distention or  surrounding inflammatory change. The appendix appears normal. Moderate stool throughout the colon. Vascular/Lymphatic: There are no enlarged abdominal or pelvic lymph nodes. Diffuse aortic and branch vessel atherosclerosis. No evidence of aneurysm or large vessel occlusion. Reproductive: Multiple brachytherapy seeds are present within the prostate gland which otherwise appears unremarkable. Other: No ascites or peritoneal nodularity. Musculoskeletal: There is a lytic destructive mass within the right iliac bone with an associated soft tissue mass, highly suspicious for metastatic disease. This measures 3.7 x 3.2 cm on image 59/2. No other suspicious osseous findings are seen. There are degenerative changes throughout the lumbar spine which contribute to mild spinal stenosis and foraminal narrowing at the lower 4 disc space levels. IMPRESSION: 1. 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 As this patient also has a remote history of prostate cancer, correlation with serum PSA levels recommended, although there are no blastic lesions. 2. No other evidence of metastatic disease in the abdomen or pelvis. 3. Innumerable hepatic cysts, similar to previous MRI. Small renal cysts. 4. Multilevel lumbar spondylosis. 5. Aortic Atherosclerosis (ICD10-I70.0) and Emphysema (ICD10-J43.9). Electronically Signed   By: Richardean Sale M.D.   On: 11/11/2021 09:10   ECHOCARDIOGRAM COMPLETE  Result Date: 11/10/2021    ECHOCARDIOGRAM REPORT   Patient Name:   Jared Mullen Date of Exam: 11/10/2021 Medical Rec #:  [id number]   Height:       69.0 in Accession #:    [id number]  Weight:       135.0 lb Date of Birth:  03/08/1941    BSA:          1.748 m Patient Age:    68 years    BP:           129/84 mmHg Patient Gender: M           HR:           78 bpm. Exam Location:  [facility] Procedure: 2D Echo, Color Doppler, Cardiac Doppler and Strain Analysis Indications:     R07.9 Chest Pain   History:         Patient has no prior history of Echocardiogram examinations.                  Risk Factors:Sleep Apnea.  Sonographer:     Charmayne Sheer Referring Phys:  [place] Diagnosing Phys: Ida Rogue MD  Sonographer Comments: Suboptimal parasternal window. Global longitudinal strain was attempted. IMPRESSIONS  1. Left ventricular ejection fraction, by estimation, is 55 to 60%. The left ventricle has normal function. The left ventricle has no regional wall motion abnormalities. Left ventricular diastolic parameters are consistent with Grade I diastolic dysfunction (impaired relaxation). The average left ventricular global longitudinal strain is -18.0 %. The global longitudinal strain is normal.  2. Right ventricular systolic function is normal. The right ventricular size is normal.  3. The mitral valve is normal in structure. No evidence of mitral valve regurgitation. No evidence of mitral stenosis.  4. The aortic valve was not well visualized. Aortic valve regurgitation is not visualized. No aortic stenosis is present.  5. The inferior vena cava is normal in size with greater than 50% respiratory variability, suggesting right atrial pressure of 3 mmHg. FINDINGS  Left Ventricle: Left ventricular ejection fraction, by estimation, is 55 to 60%. The left ventricle has normal function. The left ventricle has no regional wall motion abnormalities. The average left ventricular global longitudinal strain is -18.0 %. The global longitudinal strain is normal. The left ventricular internal cavity size was normal in size. There is no left ventricular hypertrophy. Left ventricular diastolic parameters are consistent with Grade I diastolic dysfunction (impaired relaxation). Right Ventricle: The right ventricular size is normal. No increase in right ventricular wall thickness. Right ventricular systolic function is normal. Left Atrium: Left atrial size was normal in size. Right Atrium: Right atrial size was  normal in size. Pericardium: There is no evidence of pericardial effusion. Mitral Valve: The mitral valve is normal in structure. No evidence of mitral valve regurgitation. No evidence of mitral valve stenosis. MV peak gradient, 2.4 mmHg. The mean mitral valve gradient is 1.0 mmHg. Tricuspid Valve: The tricuspid valve is normal in structure. Tricuspid valve regurgitation is  not demonstrated. No evidence of tricuspid stenosis. Aortic Valve: The aortic valve was not well visualized. Aortic valve regurgitation is not visualized. No aortic stenosis is present. Aortic valve mean gradient measures 3.0 mmHg. Aortic valve peak gradient measures 5.4 mmHg. Aortic valve area, by VTI measures 2.93 cm. Pulmonic Valve: The pulmonic valve was normal in structure. Pulmonic valve regurgitation is not visualized. No evidence of pulmonic stenosis. Aorta: The aortic root is normal in size and structure. Venous: The inferior vena cava is normal in size with greater than 50% respiratory variability, suggesting right atrial pressure of 3 mmHg. IAS/Shunts: No atrial level shunt detected by color flow Doppler.  LEFT VENTRICLE PLAX 2D LVIDd:         4.03 cm   Diastology LVIDs:         2.25 cm   LV e' medial:    6.09 cm/s LV PW:         0.96 cm   LV E/e' medial:  11.1 LV IVS:        0.70 cm   LV e' lateral:   11.00 cm/s LVOT diam:     2.10 cm   LV E/e' lateral: 6.2 LV SV:         56 LV SV Index:   32        2D Longitudinal Strain LVOT Area:     3.46 cm  2D Strain GLS Avg:     -18.0 %  RIGHT VENTRICLE RV Basal diam:  3.68 cm LEFT ATRIUM             Index        RIGHT ATRIUM           Index LA diam:        2.70 cm 1.54 cm/m   RA Area:     13.10 cm LA Vol (A2C):   37.0 ml 21.16 ml/m  RA Volume:   31.20 ml  17.85 ml/m LA Vol (A4C):   25.1 ml 14.36 ml/m LA Biplane Vol: 31.8 ml 18.19 ml/m  AORTIC VALVE                    PULMONIC VALVE AV Area (Vmax):    2.77 cm     PV Vmax:       0.61 m/s AV Area (Vmean):   2.55 cm     PV Vmean:       41.500 cm/s AV Area (VTI):     2.93 cm     PV VTI:        0.105 m AV Vmax:           116.00 cm/s  PV Peak grad:  1.5 mmHg AV Vmean:          81.000 cm/s  PV Mean grad:  1.0 mmHg AV VTI:            0.190 m AV Peak Grad:      5.4 mmHg AV Mean Grad:      3.0 mmHg LVOT Vmax:         92.70 cm/s LVOT Vmean:        59.600 cm/s LVOT VTI:          0.161 m LVOT/AV VTI ratio: 0.85  AORTA Ao Root diam: 3.60 cm MITRAL VALVE MV Area (PHT): 3.97 cm    SHUNTS MV Area VTI:   2.77 cm    Systemic VTI:  0.16 m MV Peak grad:  2.4 mmHg  Systemic Diam: 2.10 cm MV Mean grad:  1.0 mmHg MV Vmax:       0.78 m/s MV Vmean:      55.6 cm/s MV Decel Time: 191 msec MV E velocity: 67.70 cm/s MV A velocity: 81.80 cm/s MV E/A ratio:  0.83 Ida Rogue MD Electronically signed by Ida Rogue MD Signature Date/Time: 11/10/2021/2:12:02 PM    Final       ASSESSMENT/PLAN   6cm left lung mass - patient and daughter Berenda Morale was present and we discussed everything together including imaging and biopsy plan.  - patient is agreeable to biopsy.    Thank you for allowing me to participate in the care of this patient.   Patient/Family are satisfied with care plan and all questions have been answered.  This document was prepared using Dragon voice recognition software and may include unintentional dictation errors.     Ottie Glazier, M.D.  Division of [place]

## 2021-11-12 ENCOUNTER — Inpatient Hospital Stay: Payer: Medicare Other | Admitting: Anesthesiology

## 2021-11-12 ENCOUNTER — Inpatient Hospital Stay: Payer: Medicare Other

## 2021-11-12 ENCOUNTER — Encounter
Admission: EM | Disposition: A | Discharge status: Home Health | Payer: Self-pay | Source: Home / Self Care | Attending: Internal Medicine

## 2021-11-12 DIAGNOSIS — R1012 Left upper quadrant pain: Secondary | ICD-10-CM | POA: Diagnosis not present

## 2021-11-12 DIAGNOSIS — R110 Nausea: Secondary | ICD-10-CM | POA: Diagnosis not present

## 2021-11-12 DIAGNOSIS — R778 Other specified abnormalities of plasma proteins: Secondary | ICD-10-CM | POA: Diagnosis not present

## 2021-11-12 DIAGNOSIS — I248 Other forms of acute ischemic heart disease: Secondary | ICD-10-CM | POA: Diagnosis not present

## 2021-11-12 DIAGNOSIS — Z9889 Other specified postprocedural states: Secondary | ICD-10-CM

## 2021-11-12 DIAGNOSIS — Z8546 Personal history of malignant neoplasm of prostate: Secondary | ICD-10-CM

## 2021-11-12 DIAGNOSIS — E43 Unspecified severe protein-calorie malnutrition: Secondary | ICD-10-CM

## 2021-11-12 DIAGNOSIS — C61 Malignant neoplasm of prostate: Secondary | ICD-10-CM

## 2021-11-12 HISTORY — PX: VIDEO BRONCHOSCOPY WITH ENDOBRONCHIAL ULTRASOUND: SHX6177

## 2021-11-12 LAB — BASIC METABOLIC PANEL
Anion gap: 7 (ref 5–15)
BUN: 14 mg/dL (ref 8–23)
CO2: 28 mmol/L (ref 22–32)
Calcium: 9 mg/dL (ref 8.9–10.3)
Chloride: 99 mmol/L (ref 98–111)
Creatinine, Ser: 0.75 mg/dL (ref 0.61–1.24)
GFR, Estimated: >60 mL/min (ref >60)
Glucose, Bld: 116 mg/dL — ABNORMAL HIGH (ref 70–99)
Potassium: 4.1 mmol/L (ref 3.5–5.1)
Sodium: 134 mmol/L — ABNORMAL LOW (ref 135–145)

## 2021-11-12 LAB — CBC
HCT: 38.5 % — ABNORMAL LOW (ref 39.0–52.0)
Hemoglobin: 12.8 g/dL — ABNORMAL LOW (ref 13.0–17.0)
MCH: 29.3 pg (ref 26.0–34.0)
MCHC: 33.2 g/dL (ref 30.0–36.0)
MCV: 88.1 fL (ref 80.0–100.0)
Platelets: 205 10*3/uL (ref 150–400)
RBC: 4.37 MIL/uL (ref 4.22–5.81)
RDW: 13.6 % (ref 11.5–15.5)
WBC: 8 10*3/uL (ref 4.0–10.5)
nRBC: 0 % (ref 0.0–0.2)

## 2021-11-12 LAB — PROCALCITONIN: Procalcitonin: <0.1 ng/mL

## 2021-11-12 LAB — PSA: Prostatic Specific Antigen: 0.01 ng/mL (ref 0.00–4.00)

## 2021-11-12 SURGERY — BRONCHOSCOPY, WITH EBUS
Anesthesia: General | Laterality: Bilateral

## 2021-11-12 SURGERY — BRONCHOSCOPY, WITH EBUS
Anesthesia: General | Laterality: Left

## 2021-11-12 MED ORDER — DEXAMETHASONE SODIUM PHOSPHATE 10 MG/ML IJ SOLN
INTRAMUSCULAR | Status: DC | PRN
  Administered 2021-11-12: 10 mg via INTRAVENOUS

## 2021-11-12 MED ORDER — PROPOFOL 500 MG/50ML IV EMUL
INTRAVENOUS | Status: DC | PRN
Start: 2021-11-12 — End: 2021-11-12
  Administered 2021-11-12: 100 ug/kg/min via INTRAVENOUS

## 2021-11-12 MED ORDER — FENTANYL CITRATE (PF) 100 MCG/2ML IJ SOLN: 25.0000 ug | INTRAMUSCULAR | Status: DC | PRN

## 2021-11-12 MED ORDER — ACETAMINOPHEN 10 MG/ML IV SOLN: 1000.0000 mg | Freq: Once | INTRAVENOUS | Status: DC | PRN

## 2021-11-12 MED ORDER — PROPOFOL 10 MG/ML IV BOLUS
INTRAVENOUS | Status: DC | PRN
Start: 2021-11-12 — End: 2021-11-12
  Administered 2021-11-12: 100 mg via INTRAVENOUS

## 2021-11-12 MED ORDER — LIDOCAINE HCL (PF) 2 % IJ SOLN
INTRAMUSCULAR | Status: AC
  Filled 2021-11-12: qty 5

## 2021-11-12 MED ORDER — GLYCOPYRROLATE 0.2 MG/ML IJ SOLN
INTRAMUSCULAR | Status: AC
  Filled 2021-11-12: qty 1

## 2021-11-12 MED ORDER — LACTATED RINGERS IV SOLN: INTRAVENOUS | Status: DC | PRN

## 2021-11-12 MED ORDER — SUGAMMADEX SODIUM 200 MG/2ML IV SOLN
INTRAVENOUS | Status: DC | PRN
  Administered 2021-11-12: 200 mg via INTRAVENOUS

## 2021-11-12 MED ORDER — FENTANYL CITRATE (PF) 100 MCG/2ML IJ SOLN
INTRAMUSCULAR | Status: DC | PRN
  Administered 2021-11-12 (×2): 50 ug via INTRAVENOUS

## 2021-11-12 MED ORDER — PHENYLEPHRINE HCL 0.25 % NA SOLN
1.0000 | Freq: Four times a day (QID) | NASAL | Status: DC | PRN
  Filled 2021-11-12: qty 15

## 2021-11-12 MED ORDER — PROPOFOL 500 MG/50ML IV EMUL
INTRAVENOUS | Status: AC
  Filled 2021-11-12: qty 50

## 2021-11-12 MED ORDER — LIDOCAINE HCL (PF) 1 % IJ SOLN
30.0000 mL | Freq: Once | INTRAMUSCULAR | Status: DC
  Filled 2021-11-12: qty 30

## 2021-11-12 MED ORDER — DEXAMETHASONE SODIUM PHOSPHATE 10 MG/ML IJ SOLN
INTRAMUSCULAR | Status: AC
  Filled 2021-11-12: qty 1

## 2021-11-12 MED ORDER — OXYCODONE HCL 5 MG PO TABS: 5.0000 mg | ORAL_TABLET | Freq: Once | ORAL | Status: DC | PRN

## 2021-11-12 MED ORDER — LIDOCAINE HCL URETHRAL/MUCOSAL 2 % EX GEL
1.0000 "application " | Freq: Once | CUTANEOUS | Status: DC
  Filled 2021-11-12: qty 5

## 2021-11-12 MED ORDER — GLYCOPYRROLATE 0.2 MG/ML IJ SOLN
INTRAMUSCULAR | Status: DC | PRN
Start: 2021-11-12 — End: 2021-11-12
  Administered 2021-11-12: .2 mg via INTRAVENOUS

## 2021-11-12 MED ORDER — ONDANSETRON HCL 4 MG/2ML IJ SOLN
INTRAMUSCULAR | Status: AC
  Filled 2021-11-12: qty 2

## 2021-11-12 MED ORDER — ONDANSETRON HCL 4 MG/2ML IJ SOLN: 4.0000 mg | Freq: Once | INTRAMUSCULAR | Status: DC | PRN

## 2021-11-12 MED ORDER — ONDANSETRON HCL 4 MG/2ML IJ SOLN
INTRAMUSCULAR | Status: DC | PRN
Start: 2021-11-12 — End: 2021-11-12
  Administered 2021-11-12: 4 mg via INTRAVENOUS

## 2021-11-12 MED ORDER — BUTAMBEN-TETRACAINE-BENZOCAINE 2-2-14 % EX AERO
1.0000 | INHALATION_SPRAY | Freq: Once | CUTANEOUS | Status: DC
  Filled 2021-11-12: qty 20

## 2021-11-12 MED ORDER — OXYCODONE HCL 5 MG/5ML PO SOLN: 5.0000 mg | Freq: Once | ORAL | Status: DC | PRN

## 2021-11-12 MED ORDER — ROCURONIUM BROMIDE 10 MG/ML (PF) SYRINGE
PREFILLED_SYRINGE | INTRAVENOUS | Status: AC
  Filled 2021-11-12: qty 10

## 2021-11-12 MED ORDER — FENTANYL CITRATE (PF) 100 MCG/2ML IJ SOLN
INTRAMUSCULAR | Status: AC
  Filled 2021-11-12: qty 2

## 2021-11-12 MED ORDER — ROCURONIUM BROMIDE 100 MG/10ML IV SOLN
INTRAVENOUS | Status: DC | PRN
Start: 2021-11-12 — End: 2021-11-12
  Administered 2021-11-12: 50 mg via INTRAVENOUS

## 2021-11-12 MED ORDER — PHENYLEPHRINE HCL (PRESSORS) 10 MG/ML IV SOLN
INTRAVENOUS | Status: DC | PRN
Start: 2021-11-12 — End: 2021-11-12
  Administered 2021-11-12 (×4): 80 ug via INTRAVENOUS

## 2021-11-12 MED ORDER — LACTATED RINGERS IV SOLN: INTRAVENOUS | Status: DC

## 2021-11-12 MED ORDER — LIDOCAINE HCL (CARDIAC) PF 100 MG/5ML IV SOSY
PREFILLED_SYRINGE | INTRAVENOUS | Status: DC | PRN
  Administered 2021-11-12: 50 mg via INTRAVENOUS

## 2021-11-12 NOTE — Progress Notes (Signed)
PT Cancellation Note  Patient Details Name: Jared Mullen MRN: [id number] DOB: [birth year]   Cancelled Treatment:    Reason Eval/Treat Not Completed: Patient at procedure or test/unavailable, PT to re-attempt as able.  Lieutenant Diego PT, DPT 2:21 PM,11/12/21

## 2021-11-12 NOTE — Progress Notes (Signed)
OT Cancellation Note  Patient Details Name: Jared Mullen MRN: [id number] DOB: 09/07/1941   Cancelled Treatment:    Reason Eval/Treat Not Completed: Patient at procedure or test/ unavailable. Order received, chart reviewed. Pt noted to be off the floor for liver biopsy. Will re-attempt next date as available.   Dessie Coma, M.S. OTR/L  11/12/21, 2:28 PM  ascom [phone]

## 2021-11-12 NOTE — Progress Notes (Signed)
Progress Note  Patient Name: Jared Mullen Date of Encounter: 11/12/2021  [facility] Cardiologist: Ida Rogue, MD   Subjective   Plan for biopsy today. No chest pain.   Inpatient Medications    Scheduled Meds:  atorvastatin  80 mg Oral Daily   feeding supplement  237 mL Oral TID BM   melatonin  5 mg Oral QHS   metoprolol tartrate  12.5 mg Oral BID   multivitamin with minerals  1 tablet Oral Daily   pantoprazole (PROTONIX) IV  40 mg Intravenous Q12H   Continuous Infusions:  PRN Meds: acetaminophen **OR** acetaminophen, hydrALAZINE, HYDROcodone-acetaminophen, morphine injection, nitroGLYCERIN, ondansetron (ZOFRAN) IV, senna-docusate   Vital Signs    Vitals:   11/11/21 1509 11/11/21 1922 11/12/21 0424 11/12/21 0738  BP: 118/82 132/63 110/69 114/69  Pulse: 73 81 74 73  Resp:  20 20 18   Temp: 98.8 F (37.1 C) 99.1 F (37.3 C) (!) 97.5 F (36.4 C) 98.5 F (36.9 C)  TempSrc: Oral Oral Oral Oral  SpO2: 97% 98% 97% 97%  Weight:      Height:        Intake/Output Summary (Last 24 hours) at 11/12/2021 0848 Last data filed at 11/12/2021 6073 Gross per 24 hour  Intake 300 ml  Output 1550 ml  Net -1250 ml   Last 3 Weights 11/09/2021 11/09/2021 11/02/2021  Weight (lbs) 135 lb 135 lb 165 lb 8 oz  Weight (kg) 61.236 kg 61.236 kg 75.07 kg      Telemetry    NSR HR 70 - Personally Reviewed  ECG    No new - Personally Reviewed  Physical Exam   GEN: No acute distress.   Neck: No JVD Cardiac: RRR, no murmurs, rubs, or gallops.  Respiratory: diffusely diminished bilaterally. GI: Soft, nontender, non-distended  MS: No edema; No deformity. Neuro:  Nonfocal  Psych: Normal affect   Labs    High Sensitivity Troponin:   Recent Labs  Lab 11/09/21 1536 11/09/21 1734 11/09/21 2358 11/10/21 0143  TROPONINIHS 410* 491* 407* 382*     Chemistry Recent Labs  Lab 11/09/21 1536 11/11/21 0505 11/12/21 0451  NA 132* 135 134*  K 4.5 3.9 4.1  CL 96* 100 99  CO2  29 27 28   GLUCOSE 122* 112* 116*  BUN 16 16 14   CREATININE 0.72 0.72 0.75  CALCIUM 9.4 8.7* 9.0  PROT 7.4  --   --   ALBUMIN 3.5  --   --   AST 17  --   --   ALT 13  --   --   ALKPHOS 71  --   --   BILITOT 1.2  --   --   GFRNONAA >60 >60 >60  ANIONGAP 7 8 7     Lipids  Recent Labs  Lab 11/09/21 1536  CHOL 134  TRIG 34  HDL 41  LDLCALC 86  CHOLHDL 3.3    Hematology Recent Labs  Lab 11/10/21 0143 11/11/21 0505 11/12/21 0451  WBC 6.9 8.1 8.0  RBC 4.42 4.23 4.37  HGB 13.2 12.5* 12.8*  HCT 38.9* 37.5* 38.5*  MCV 88.0 88.7 88.1  MCH 29.9 29.6 29.3  MCHC 33.9 33.3 33.2  RDW 13.5 13.5 13.6  PLT 199 184 205   Thyroid  Recent Labs  Lab 11/09/21 1536  TSH 1.767    BNPNo results for input(s): BNP, PROBNP in the last 168 hours.  DDimer No results for input(s): DDIMER in the last 168 hours.   Radiology  MR BRAIN W WO CONTRAST  Result Date: 11/10/2021 CLINICAL DATA:  Metastatic disease evaluation, lung mass EXAM: MRI HEAD WITHOUT AND WITH CONTRAST TECHNIQUE: Multiplanar, multiecho pulse sequences of the brain and surrounding structures were obtained without and with intravenous contrast. CONTRAST:  104mL GADAVIST GADOBUTROL 1 MMOL/ML IV SOLN COMPARISON:  None. FINDINGS: Brain: There is no acute infarction or intracranial hemorrhage. There is no intracranial mass, mass effect, or edema. There is no hydrocephalus or extra-axial fluid collection. Prominence of the ventricles and sulci reflects parenchymal volume loss. Patchy T2 hyperintensity in the supratentorial white matter is nonspecific but may reflect minor chronic microvascular ischemic changes. No abnormal enhancement. Vascular: Major vessel flow voids at the skull base are preserved. Skull and upper cervical spine: Normal marrow signal is preserved. Sinuses/Orbits: Paranasal sinus mucosal thickening. Orbits are unremarkable. Other: Sella is unremarkable.  Mastoid air cells are clear. IMPRESSION: No evidence of intracranial  metastatic disease. Electronically Signed   By: Macy Mis M.D.   On: 11/10/2021 14:37   CT ABDOMEN PELVIS W CONTRAST  Result Date: 11/11/2021 CLINICAL DATA:  Non-small cell lung cancer staging. EXAM: CT ABDOMEN AND PELVIS WITH CONTRAST TECHNIQUE: Multidetector CT imaging of the abdomen and pelvis was performed using the standard protocol following bolus administration of intravenous contrast. RADIATION DOSE REDUCTION: This exam was performed according to the departmental dose-optimization program which includes automated exposure control, adjustment of the mA and/or kV according to patient size and/or use of iterative reconstruction technique. CONTRAST:  52mL OMNIPAQUE IOHEXOL 300 MG/ML  SOLN COMPARISON:  Chest CTA 11/09/2021, abdominal MRI 06/22/2019 and abdominal ultrasound 01/02/2018. FINDINGS: Lower chest: Calcified pleural plaque, pleural thickening and linear scarring at both lung bases. The known left hilar mass is not imaged on this abdominal CT. No significant pleural or pericardial effusion. Aortic and coronary artery atherosclerosis are noted. Hepatobiliary: Numerous hepatic cysts are again noted, similar to previous MRI. No suspicious liver lesions are identified. There is no significant biliary dilatation status post cholecystectomy. Pancreas: Unremarkable. No pancreatic ductal dilatation or surrounding inflammatory changes. Spleen: Normal in size without focal abnormality. Adrenals/Urinary Tract: Both adrenal glands appear normal. Small bilateral renal cysts. No evidence of renal mass, urinary tract calculus or hydronephrosis. The bladder appears unremarkable. Stomach/Bowel: Enteric contrast was administered and has passed into the rectum. The stomach appears unremarkable for its degree of distension. No evidence of bowel wall thickening, distention or surrounding inflammatory change. The appendix appears normal. Moderate stool throughout the colon. Vascular/Lymphatic: There are no enlarged  abdominal or pelvic lymph nodes. Diffuse aortic and branch vessel atherosclerosis. No evidence of aneurysm or large vessel occlusion. Reproductive: Multiple brachytherapy seeds are present within the prostate gland which otherwise appears unremarkable. Other: No ascites or peritoneal nodularity. Musculoskeletal: There is a lytic destructive mass within the right iliac bone with an associated soft tissue mass, highly suspicious for metastatic disease. This measures 3.7 x 3.2 cm on image 59/2. No other suspicious osseous findings are seen. There are degenerative changes throughout the lumbar spine which contribute to mild spinal stenosis and foraminal narrowing at the lower 4 disc space levels. IMPRESSION: 1. 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 As this patient also has a remote history of prostate cancer, correlation with serum PSA levels recommended, although there are no blastic lesions. 2. No other evidence of metastatic disease in the abdomen or pelvis. 3. Innumerable hepatic cysts, similar to previous MRI. Small renal cysts. 4. Multilevel lumbar spondylosis. 5.  Aortic Atherosclerosis (ICD10-I70.0) and Emphysema (ICD10-J43.9). Electronically Signed   By: Richardean Sale M.D.   On: 11/11/2021 09:10   ECHOCARDIOGRAM COMPLETE  Result Date: 11/10/2021    ECHOCARDIOGRAM REPORT   Patient Name:   HARDING THOMURE Date of Exam: 11/10/2021 Medical Rec #:  [id number]   Height:       69.0 in Accession #:    [id number]  Weight:       135.0 lb Date of Birth:  1941/01/21    BSA:          1.748 m Patient Age:    78 years    BP:           129/84 mmHg Patient Gender: M           HR:           78 bpm. Exam Location:  [facility] Procedure: 2D Echo, Color Doppler, Cardiac Doppler and Strain Analysis Indications:     R07.9 Chest Pain  History:         Patient has no prior history of Echocardiogram examinations.                  Risk Factors:Sleep Apnea.  Sonographer:      Charmayne Sheer Referring Phys:  [place] Diagnosing Phys: Ida Rogue MD  Sonographer Comments: Suboptimal parasternal window. Global longitudinal strain was attempted. IMPRESSIONS  1. Left ventricular ejection fraction, by estimation, is 55 to 60%. The left ventricle has normal function. The left ventricle has no regional wall motion abnormalities. Left ventricular diastolic parameters are consistent with Grade I diastolic dysfunction (impaired relaxation). The average left ventricular global longitudinal strain is -18.0 %. The global longitudinal strain is normal.  2. Right ventricular systolic function is normal. The right ventricular size is normal.  3. The mitral valve is normal in structure. No evidence of mitral valve regurgitation. No evidence of mitral stenosis.  4. The aortic valve was not well visualized. Aortic valve regurgitation is not visualized. No aortic stenosis is present.  5. The inferior vena cava is normal in size with greater than 50% respiratory variability, suggesting right atrial pressure of 3 mmHg. FINDINGS  Left Ventricle: Left ventricular ejection fraction, by estimation, is 55 to 60%. The left ventricle has normal function. The left ventricle has no regional wall motion abnormalities. The average left ventricular global longitudinal strain is -18.0 %. The global longitudinal strain is normal. The left ventricular internal cavity size was normal in size. There is no left ventricular hypertrophy. Left ventricular diastolic parameters are consistent with Grade I diastolic dysfunction (impaired relaxation). Right Ventricle: The right ventricular size is normal. No increase in right ventricular wall thickness. Right ventricular systolic function is normal. Left Atrium: Left atrial size was normal in size. Right Atrium: Right atrial size was normal in size. Pericardium: There is no evidence of pericardial effusion. Mitral Valve: The mitral valve is normal in structure. No evidence  of mitral valve regurgitation. No evidence of mitral valve stenosis. MV peak gradient, 2.4 mmHg. The mean mitral valve gradient is 1.0 mmHg. Tricuspid Valve: The tricuspid valve is normal in structure. Tricuspid valve regurgitation is not demonstrated. No evidence of tricuspid stenosis. Aortic Valve: The aortic valve was not well visualized. Aortic valve regurgitation is not visualized. No aortic stenosis is present. Aortic valve mean gradient measures 3.0 mmHg. Aortic valve peak gradient measures 5.4 mmHg. Aortic valve area, by VTI measures 2.93 cm. Pulmonic Valve: The pulmonic valve was normal in structure.  Pulmonic valve regurgitation is not visualized. No evidence of pulmonic stenosis. Aorta: The aortic root is normal in size and structure. Venous: The inferior vena cava is normal in size with greater than 50% respiratory variability, suggesting right atrial pressure of 3 mmHg. IAS/Shunts: No atrial level shunt detected by color flow Doppler.  LEFT VENTRICLE PLAX 2D LVIDd:         4.03 cm   Diastology LVIDs:         2.25 cm   LV e' medial:    6.09 cm/s LV PW:         0.96 cm   LV E/e' medial:  11.1 LV IVS:        0.70 cm   LV e' lateral:   11.00 cm/s LVOT diam:     2.10 cm   LV E/e' lateral: 6.2 LV SV:         56 LV SV Index:   32        2D Longitudinal Strain LVOT Area:     3.46 cm  2D Strain GLS Avg:     -18.0 %  RIGHT VENTRICLE RV Basal diam:  3.68 cm LEFT ATRIUM             Index        RIGHT ATRIUM           Index LA diam:        2.70 cm 1.54 cm/m   RA Area:     13.10 cm LA Vol (A2C):   37.0 ml 21.16 ml/m  RA Volume:   31.20 ml  17.85 ml/m LA Vol (A4C):   25.1 ml 14.36 ml/m LA Biplane Vol: 31.8 ml 18.19 ml/m  AORTIC VALVE                    PULMONIC VALVE AV Area (Vmax):    2.77 cm     PV Vmax:       0.61 m/s AV Area (Vmean):   2.55 cm     PV Vmean:      41.500 cm/s AV Area (VTI):     2.93 cm     PV VTI:        0.105 m AV Vmax:           116.00 cm/s  PV Peak grad:  1.5 mmHg AV Vmean:           81.000 cm/s  PV Mean grad:  1.0 mmHg AV VTI:            0.190 m AV Peak Grad:      5.4 mmHg AV Mean Grad:      3.0 mmHg LVOT Vmax:         92.70 cm/s LVOT Vmean:        59.600 cm/s LVOT VTI:          0.161 m LVOT/AV VTI ratio: 0.85  AORTA Ao Root diam: 3.60 cm MITRAL VALVE MV Area (PHT): 3.97 cm    SHUNTS MV Area VTI:   2.77 cm    Systemic VTI:  0.16 m MV Peak grad:  2.4 mmHg    Systemic Diam: 2.10 cm MV Mean grad:  1.0 mmHg MV Vmax:       0.78 m/s MV Vmean:      55.6 cm/s MV Decel Time: 191 msec MV E velocity: 67.70 cm/s MV A velocity: 81.80 cm/s MV E/A ratio:  0.83 Ida Rogue MD Electronically signed by Ida Rogue  MD Signature Date/Time: 11/10/2021/2:12:02 PM    Final     Cardiac Studies   2D Echocardiogram 01.24.2023    1. Left ventricular ejection fraction, by estimation, is 55 to 60%. The  left ventricle has normal function. The left ventricle has no regional  wall motion abnormalities. Left ventricular diastolic parameters are  consistent with Grade I diastolic  dysfunction (impaired relaxation). The average left ventricular global  longitudinal strain is -18.0 %. The global longitudinal strain is normal.   2. Right ventricular systolic function is normal. The right ventricular  size is normal.   3. The mitral valve is normal in structure. No evidence of mitral valve  regurgitation. No evidence of mitral stenosis.   4. The aortic valve was not well visualized. Aortic valve regurgitation  is not visualized. No aortic stenosis is present.   5. The inferior vena cava is normal in size with greater than 50%  respiratory variability, suggesting right atrial pressure of 3 mmHg.   Patient Profile     81 y.o. male w/ a h/o prostate cancer and sleep apnea, who presented 1/23 w/ a 6-12 month h/o cough, peri-prandial abd pain/nausea, weakness, and findings of elevated HsTrop and L lung mass  Assessment & Plan    Elevated troponin/demand ischemia - no prior cardiac history - admitted  with cough, cold, nausea 1/23 also has lost 30lbs - HS trop noted to peak at 491 - EKG showed inferolateral ST elevation and prior septal infarct w/ TWI, similar to old EKGs - Echo showed LVEF 55069%, no WMA, no sig valvular dz - He has DOE associated with nasuea and abd pain. No chest pain reported.  - Given lack of anginal symptoms and HS troponin trend not consistent with ACS IV heparin stopped - No plan for ischemic work-up during hospitalization, can be as OP - continue ASA, statin and low dose BB  L lung mass with lytic lesion in R iliac bone - oncology following, suspect lung cancer - plan for bx and OP f/u  For questions or updates, please contact [facility] Please consult [email or web address] for contact info under        Signed, Elliot Simoneaux Ninfa Meeker, PA-C  11/12/2021, 8:48 AM

## 2021-11-12 NOTE — Procedures (Signed)
ELECTROMAGNETIC NAVIGATIONAL BRONCHOSCOPY PROCEDURE NOTE  FIBEROPTIC BRONCHOSCOPY WITH BRONCHOALVEOLAR LAVAGE PROCEDURE AND THERAPEUTIC ASPIRATION OF TRACHEOBRONCHIAL TREE PROCEDURE NOTE  ENDOBRONCHIAL ULTRASOUND PROCEDURE NOTE    Flexible bronchoscopy was performed  by : Lanney Gins MD  assistance by : 1)Repiratory therapist  and 2)[employer] staff and 3) Anesthesia team and 4) Flouroscopy team    Indication for the procedure was :  Pre-procedural H&P. The following assessment was performed on the day of the procedure prior to initiating sedation History:  Chest pain n Dyspnea y Hemoptysis n Cough y Fever n Other pertinent items n  Examination Vital signs -reviewed as per nursing documentation today Cardiac    Murmurs: n  Rubs : n  Gallop: n Lungs Wheezing: n Rales : n Rhonchi :y  Other pertinent findings: SOB/hypoxemia due to chronic lung disease   Pre-procedural assessment for Procedural Sedation included: Depth of sedation: As per anesthesia team  ASA Classification:  2 Mallampati airway assessment: 3    Medication list reviewed: y  The patients interval history was taken and revealed: no new complaints The pre- procedure physical examination revealed: No new findings Refer to prior clinic note for details.  Informed Consent: Informed consent was obtained from:  patient after explanation of procedure and risks, benefits, as well as alternative procedures available.  Explanation of level of sedation and possible transfusion was also provided.    Procedural Preparation: Time out was performed and patient was identified by name and birthdate and procedure to be performed and side for sampling, if any, was specified. Pt was intubated by anesthesia.  The patient was appropriately draped.   Fiberoptic bronchoscopy with airway inspection and BAL Procedure findings:  Bronchoscope was inserted via ETT  without difficulty.  Posterior oropharynx, epiglottis,  arytenoids, false cords and vocal cords were not visualized as these were bypassed by endotracheal tube. The distal trachea was normal in circumference and appearance without mucosal, cartilaginous or branching abnormalities.  The main carina was mildly splayed . All right and left lobar airways were visualized to the Subsegmental level.  Sub- sub segmental carinae were identified in all the distal airways.   Secretions were visible in the following airways and appeared to be clear.  The mucosa was : friable at RUL  Airways were notable for:        exophytic lesions :n       extrinsic compression in the following distributions: n.       Friable mucosa: y       Neurosurgeon /pigmentation: n   Mucus plugging worse at LUL was aspirated and evacuated from lung  Post procedure Diagnosis:   stenosis of left upper lobe segments     Electromagnetic Navigational Bronchoscopy Procedure Findings:  After appropriate CT-guided planning ENB scope was advanced via endotracheal tube and LG was advanced for registration.  Post appropriate planning and registration peripheral navigation was used to visualize target lesion.    Surgical biopsy x 5 at LUL Cytobrush x 2 at LUL  Post procedure diagnosis: lung cancer       Endobronchial ultrasound assisted hilar and mediastinal lymph node biopsies procedure findings: The fiberoptic bronchoscope was removed and the EBUS scope was introduced. Examination began to evaluate for pathologically enlarged lymph nodes starting on the right side progressing to the left side.  All lymph node biopsies performed with 21 needle. Lymph node biopsies were sent in cytolite for all stations. Staion 10R and 4 R non biopsied due to small size less then 66m Station  7 - 3 passes - normal Station 10 L 3 passes - normal  Staion 4L - 3 passes - lesional cancer cells  Post procedure diagnosis:  metastatic lung cancer   Specimens obtained included:                  Cytology brushes : LUL  Broncho-alveolar lavage site:LUL   sent for cytology                              54m volume infused 2-ml volume returned with cellular  appearance   Estimated Blood loss:excpected  5 cc.  Complications included:  None immediate  \  Disposition: Follow up with oncologist and myself in 7 days  Follow up with Dr. ALanney Ginsin 5 days for result discussion.     FOttie GlazierMD  KOrocovisDivision of Pulmonary & Critical Care Medicine

## 2021-11-12 NOTE — Progress Notes (Signed)
PULMONOLOGY         Date: 11/12/2021,   MRN# [id number] Jared Mullen 1941/05/22     AdmissionWeight: 61.2 kg                 CurrentWeight: 61.2 kg   Referring physician: Dr Jimmye Norman   CHIEF COMPLAINT:   Left lung mass   HISTORY OF PRESENT ILLNESS   Patient is an 81 yo M with hx of smoking from age 67 to 39 and has not smoked since then.  He has had cough for past 1 yr. He does admit to worsening cough and fatigue. He has left lung mass and we reviewed CT chest together today.   He underwent CT angio chest which showed left perihilar upper lobe masslike opacity measuring 6.6 x 5 x 6.1 cm contiguous with the hilum.  Margins are irregular and spiculated with linear extension into the periphery of the left upper lobe.  Findings concerning for primary bronchogenic malignancy.    11/12/21- Patient NPO scheduled for bronchoscopy noon today. No overnight events, good urine output , vitals stable on room air.    PAST MEDICAL HISTORY   Past Medical History:  Diagnosis Date   Abnormal ECG    a. baseline inferolateral ST elevation dating back to at least 2012.   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   Sleep apnea    Urinary frequency    Weak urinary stream    Weight loss      SURGICAL HISTORY   Past Surgical History:  Procedure Laterality Date   biospy  09/30/2011-   TRUS/Bx Prostate - 5/12/biopsies positive for cancer, glandular size 32.5 cc   CHOLECYSTECTOMY  1992   RADIOACTIVE SEED IMPLANT  02/09/2012   Procedure: RADIOACTIVE SEED IMPLANT;  Surgeon: Franchot Gallo, MD;  Location: [facility];  Service: Urology;  Laterality: N/A;  RAD TECH OK PER ANNE AT MAIN OR    UVULOPALATOPHARYNGOPLASTY       FAMILY HISTORY   Family History  Problem Relation Age of Onset   Heart disease Father    Breast cancer  Sister    Cancer Sister        lung   Lung cancer Brother    Cancer Brother        bladder   Lung cancer Sister    Cancer Sister        breast   Kidney disease Brother      SOCIAL HISTORY   Social History   Tobacco Use   Smoking status: Former    Packs/day: 2.00    Years: 28.00    Pack years: 56.00    Types: Cigarettes    Quit date: 10/18/1980    Years since quitting: 41.0   Smokeless tobacco: Never  Substance Use Topics   Alcohol use: No   Drug use: No     MEDICATIONS    Home Medication:    Current Medication:  Current Facility-Administered Medications:    acetaminophen (TYLENOL) tablet 650 mg, 650 mg, Oral, Q6H PRN, 650 mg at 11/10/21 1533 **OR** acetaminophen (TYLENOL) suppository 650 mg, 650 mg, Rectal, Q6H PRN, Para Skeans, MD   atorvastatin (LIPITOR) tablet 80 mg, 80 mg, Oral, Daily, Theora Gianotti, NP, 80 mg at 11/11/21 1032   feeding supplement (  ENSURE ENLIVE / ENSURE PLUS) liquid 237 mL, 237 mL, Oral, TID BM, Wieting, Richard, MD, 237 mL at 11/10/21 1904   hydrALAZINE (APRESOLINE) injection 5 mg, 5 mg, Intravenous, Q6H PRN, Para Skeans, MD   HYDROcodone-acetaminophen (NORCO/VICODIN) 5-325 MG per tablet 1 tablet, 1 tablet, Oral, Q6H PRN, Borders, Kirt Boys, NP   melatonin tablet 5 mg, 5 mg, Oral, QHS, Florina Ou V, MD, 5 mg at 11/11/21 2117   metoprolol tartrate (LOPRESSOR) tablet 12.5 mg, 12.5 mg, Oral, BID, Theora Gianotti, NP, 12.5 mg at 11/11/21 2116   morphine 2 MG/ML injection 1 mg, 1 mg, Intravenous, Q6H PRN, Para Skeans, MD, 1 mg at 11/11/21 2116   multivitamin with minerals tablet 1 tablet, 1 tablet, Oral, Daily, Wieting, Richard, MD, 1 tablet at 11/11/21 1032   nitroGLYCERIN (NITROSTAT) SL tablet 0.4 mg, 0.4 mg, Sublingual, Q5 min PRN, Para Skeans, MD   ondansetron [facility]) injection 4 mg, 4 mg, Intravenous, Q6H PRN, Loletha Grayer, MD, 4 mg at 11/11/21 1825   pantoprazole (PROTONIX) injection 40 mg, 40 mg, Intravenous,  Q12H, Florina Ou V, MD, 40 mg at 11/11/21 2115   senna-docusate (Senokot-S) tablet 1 tablet, 1 tablet, Oral, QHS PRN, Borders, Kirt Boys, NP    ALLERGIES   Patient has no known allergies.     REVIEW OF SYSTEMS    Review of Systems:  Gen:  Denies  fever, sweats, chills weigh loss  HEENT: Denies blurred vision, double vision, ear pain, eye pain, hearing loss, nose bleeds, sore throat Cardiac:  No dizziness, chest pain or heaviness, chest tightness,edema Resp:   Denies cough or sputum porduction, shortness of breath,wheezing, hemoptysis,  Gi: Denies swallowing difficulty, stomach pain, nausea or vomiting, diarrhea, constipation, bowel incontinence Gu:  Denies bladder incontinence, burning urine Ext:   Denies Joint pain, stiffness or swelling Skin: Denies  skin rash, easy bruising or bleeding or hives Endoc:  Denies polyuria, polydipsia , polyphagia or weight change Psych:   Denies depression, insomnia or hallucinations   Other:  All other systems negative   VS: BP 114/69 (BP Location: Left Arm)    Pulse 73    Temp 98.5 F (36.9 C) (Oral)    Resp 18    Ht 5\' 9"  (1.753 m)    Wt 61.2 kg    SpO2 97%    BMI 19.94 kg/m      PHYSICAL EXAM    GENERAL:NAD, no fevers, chills, no weakness no fatigue HEAD: Normocephalic, atraumatic.  EYES: Pupils equal, round, reactive to light. Extraocular muscles intact. No scleral icterus.  MOUTH: Moist mucosal membrane. Dentition intact. No abscess noted.  EAR, NOSE, THROAT: Clear without exudates. No external lesions.  NECK: Supple. No thyromegaly. No nodules. No JVD.  PULMONARY: Diffuse coarse rhonchi right sided +wheezes CARDIOVASCULAR: S1 and S2. Regular rate and rhythm. No murmurs, rubs, or gallops. No edema. Pedal pulses 2+ bilaterally.  GASTROINTESTINAL: Soft, nontender, nondistended. No masses. Positive bowel sounds. No hepatosplenomegaly.  MUSCULOSKELETAL: No swelling, clubbing, or edema. Range of motion full in all extremities.   NEUROLOGIC: Cranial nerves II through XII are intact. No gross focal neurological deficits. Sensation intact. Reflexes intact.  SKIN: No ulceration, lesions, rashes, or cyanosis. Skin warm and dry. Turgor intact.  PSYCHIATRIC: Mood, affect within normal limits. The patient is awake, alert and oriented x 3. Insight, judgment intact.       IMAGING    DG Chest 2 View  Result Date: 11/09/2021 CLINICAL DATA:  Nausea, belching decreased appetite  for 2 months, history prostate cancer EXAM: CHEST - 2 VIEW COMPARISON:  03/07/2017 rib radiographs, chest radiograph 11/16/2016 FINDINGS: Normal heart size, mediastinal contours, and pulmonary vascularity. Small calcifications in the chest bilaterally again identified likely calcified pleural plaques. New LEFT perihilar mass 6.6 x 6.4 cm highly suspicious for pulmonary neoplasm. No acute infiltrate, pleural effusion or pneumothorax. No definite acute bone lesions. IMPRESSION: New LEFT perihilar mass 6.6 x 6.4 cm highly suspicious for a pulmonary neoplasm; CT chest with contrast recommended for further evaluation. Calcifications in the chest bilaterally most consistent with calcified pleural plaques, unchanged, question prior asbestos exposure. Aortic Atherosclerosis (ICD10-I70.0). Electronically Signed   By: Lavonia Dana M.D.   On: 11/09/2021 17:09   CT Angio Chest PE W/Cm &/Or Wo Cm  Result Date: 11/09/2021 CLINICAL DATA:  Pulmonary embolism (PE) suspected, high prob Two month history of nausea and decreased appetite. Symptoms after patient's wife done 2 months ago. EXAM: CT ANGIOGRAPHY CHEST WITH CONTRAST TECHNIQUE: Multidetector CT imaging of the chest was performed using the standard protocol during bolus administration of intravenous contrast. Multiplanar CT image reconstructions and MIPs were obtained to evaluate the vascular anatomy. RADIATION DOSE REDUCTION: This exam was performed according to the departmental dose-optimization program which includes  automated exposure control, adjustment of the mA and/or kV according to patient size and/or use of iterative reconstruction technique. CONTRAST:  18mL OMNIPAQUE IOHEXOL 350 MG/ML SOLN COMPARISON:  Chest radiograph earlier today. FINDINGS: Cardiovascular: There are no filling defects within the pulmonary arteries to suggest pulmonary embolus. The lingular pulmonary arteries are attenuated due to left perihilar mass. Atherosclerosis of the thoracic aorta. No aortic aneurysm or acute aortic findings. The heart is normal in size. There is no pericardial effusion. Mediastinum/Nodes: Left upper lobe perihilar mass is contiguous with the hilum. There is likely separate suprahilar adenopathy within 18 mm lymph node series 5, image 46. Likely infrahilar lymph node measuring 2.4 cm series 5, image 58. 9 mm right hilar node. Occasional prominent left lower paratracheal nodes, including a 10 mm node series 5, image 38. No definite supraclavicular adenopathy. No visualized thyroid nodule. Upper esophagus is patulous. No esophageal wall thickening. Lungs/Pleura: Left perihilar upper lobe masslike opacity. This measures 6.6 x 5.0 x 6.1 cm, measured on series 7, image 46 and series 8, image 55. Margins are irregular and spiculated with linear extension to the periphery of the pleural surface of the left upper lobe. There are small adjacent satellite nodules. Separate left upper lobe 3 mm nodule series 7, image 32. Irregular opacity at the left lung apex which appears triangular measuring 11 mm, series 7, image 21. Biapical pleuroparenchymal scarring, some of which is calcified. Bilateral calcified pleural plaques. Multiple calcified granuloma in the right lower lobe. Severe narrowing of the left upper lobe bronchus due to in left perihilar mass. No pleural effusion. Upper Abdomen: Innumerable low-density lesions within the liver, largest lesions appear to be simple fluid density. Previous abdominal MRI 06/22/2019 demonstrated  multiple hepatic cysts. No discrete adrenal nodule. Musculoskeletal: Slight flattening of mid thoracic vertebra. No destructive lytic or focal blastic osseous lesion. Review of the MIP images confirms the above findings. IMPRESSION: 1. No pulmonary embolus. 2. Left perihilar upper lobe masslike opacity measuring 6.6 x 5.0 x 6.1 cm, contiguous with the hilum. Margins are irregular and spiculated with linear extension to the periphery of the left upper lobe. Findings are highly suspicious for primary bronchogenic malignancy. 3. Perihilar mass is contiguous with the hilum, however there appears to be a  discrete left hilar adenopathy. Prominent left lower paratracheal nodes, and a nonspecific 9 mm right hilar node. 4. Separate 3 mm left upper lobe nodule. Irregular opacity at the left lung apex appears triangular measuring 11 mm, nonspecific but may be scarring. 5. Severe narrowing of the left upper lobe bronchus due to left perihilar mass. 6. Innumerable low-density lesions within the liver, largest lesions appear to be simple fluid density. These were previously characterized as hepatic cysts on MRI, although not well assessed on the current exam due to phase of contrast. 7. 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 Aortic Atherosclerosis (ICD10-I70.0). Electronically Signed   By: Keith Rake M.D.   On: 11/09/2021 18:02   MR BRAIN W WO CONTRAST  Result Date: 11/10/2021 CLINICAL DATA:  Metastatic disease evaluation, lung mass EXAM: MRI HEAD WITHOUT AND WITH CONTRAST TECHNIQUE: Multiplanar, multiecho pulse sequences of the brain and surrounding structures were obtained without and with intravenous contrast. CONTRAST:  26mL GADAVIST GADOBUTROL 1 MMOL/ML IV SOLN COMPARISON:  None. FINDINGS: Brain: There is no acute infarction or intracranial hemorrhage. There is no intracranial mass, mass effect, or edema. There is no hydrocephalus or extra-axial fluid collection. Prominence of the  ventricles and sulci reflects parenchymal volume loss. Patchy T2 hyperintensity in the supratentorial white matter is nonspecific but may reflect minor chronic microvascular ischemic changes. No abnormal enhancement. Vascular: Major vessel flow voids at the skull base are preserved. Skull and upper cervical spine: Normal marrow signal is preserved. Sinuses/Orbits: Paranasal sinus mucosal thickening. Orbits are unremarkable. Other: Sella is unremarkable.  Mastoid air cells are clear. IMPRESSION: No evidence of intracranial metastatic disease. Electronically Signed   By: Macy Mis M.D.   On: 11/10/2021 14:37   CT ABDOMEN PELVIS W CONTRAST  Result Date: 11/11/2021 CLINICAL DATA:  Non-small cell lung cancer staging. EXAM: CT ABDOMEN AND PELVIS WITH CONTRAST TECHNIQUE: Multidetector CT imaging of the abdomen and pelvis was performed using the standard protocol following bolus administration of intravenous contrast. RADIATION DOSE REDUCTION: This exam was performed according to the departmental dose-optimization program which includes automated exposure control, adjustment of the mA and/or kV according to patient size and/or use of iterative reconstruction technique. CONTRAST:  68mL OMNIPAQUE IOHEXOL 300 MG/ML  SOLN COMPARISON:  Chest CTA 11/09/2021, abdominal MRI 06/22/2019 and abdominal ultrasound 01/02/2018. FINDINGS: Lower chest: Calcified pleural plaque, pleural thickening and linear scarring at both lung bases. The known left hilar mass is not imaged on this abdominal CT. No significant pleural or pericardial effusion. Aortic and coronary artery atherosclerosis are noted. Hepatobiliary: Numerous hepatic cysts are again noted, similar to previous MRI. No suspicious liver lesions are identified. There is no significant biliary dilatation status post cholecystectomy. Pancreas: Unremarkable. No pancreatic ductal dilatation or surrounding inflammatory changes. Spleen: Normal in size without focal abnormality.  Adrenals/Urinary Tract: Both adrenal glands appear normal. Small bilateral renal cysts. No evidence of renal mass, urinary tract calculus or hydronephrosis. The bladder appears unremarkable. Stomach/Bowel: Enteric contrast was administered and has passed into the rectum. The stomach appears unremarkable for its degree of distension. No evidence of bowel wall thickening, distention or surrounding inflammatory change. The appendix appears normal. Moderate stool throughout the colon. Vascular/Lymphatic: There are no enlarged abdominal or pelvic lymph nodes. Diffuse aortic and branch vessel atherosclerosis. No evidence of aneurysm or large vessel occlusion. Reproductive: Multiple brachytherapy seeds are present within the prostate gland which otherwise appears unremarkable. Other: No ascites or peritoneal nodularity. Musculoskeletal: There is a lytic destructive mass within the right iliac  bone with an associated soft tissue mass, highly suspicious for metastatic disease. This measures 3.7 x 3.2 cm on image 59/2. No other suspicious osseous findings are seen. There are degenerative changes throughout the lumbar spine which contribute to mild spinal stenosis and foraminal narrowing at the lower 4 disc space levels. IMPRESSION: 1. 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 As this patient also has a remote history of prostate cancer, correlation with serum PSA levels recommended, although there are no blastic lesions. 2. No other evidence of metastatic disease in the abdomen or pelvis. 3. Innumerable hepatic cysts, similar to previous MRI. Small renal cysts. 4. Multilevel lumbar spondylosis. 5. Aortic Atherosclerosis (ICD10-I70.0) and Emphysema (ICD10-J43.9). Electronically Signed   By: Richardean Sale M.D.   On: 11/11/2021 09:10   ECHOCARDIOGRAM COMPLETE  Result Date: 11/10/2021    ECHOCARDIOGRAM REPORT   Patient Name:   Jared Mullen Date of Exam:  11/10/2021 Medical Rec #:  [id number]   Height:       69.0 in Accession #:    [id number]  Weight:       135.0 lb Date of Birth:  Apr 18, 1941    BSA:          1.748 m Patient Age:    69 years    BP:           129/84 mmHg Patient Gender: M           HR:           78 bpm. Exam Location:  [facility] Procedure: 2D Echo, Color Doppler, Cardiac Doppler and Strain Analysis Indications:     R07.9 Chest Pain  History:         Patient has no prior history of Echocardiogram examinations.                  Risk Factors:Sleep Apnea.  Sonographer:     Charmayne Sheer Referring Phys:  [place] Diagnosing Phys: Ida Rogue MD  Sonographer Comments: Suboptimal parasternal window. Global longitudinal strain was attempted. IMPRESSIONS  1. Left ventricular ejection fraction, by estimation, is 55 to 60%. The left ventricle has normal function. The left ventricle has no regional wall motion abnormalities. Left ventricular diastolic parameters are consistent with Grade I diastolic dysfunction (impaired relaxation). The average left ventricular global longitudinal strain is -18.0 %. The global longitudinal strain is normal.  2. Right ventricular systolic function is normal. The right ventricular size is normal.  3. The mitral valve is normal in structure. No evidence of mitral valve regurgitation. No evidence of mitral stenosis.  4. The aortic valve was not well visualized. Aortic valve regurgitation is not visualized. No aortic stenosis is present.  5. The inferior vena cava is normal in size with greater than 50% respiratory variability, suggesting right atrial pressure of 3 mmHg. FINDINGS  Left Ventricle: Left ventricular ejection fraction, by estimation, is 55 to 60%. The left ventricle has normal function. The left ventricle has no regional wall motion abnormalities. The average left ventricular global longitudinal strain is -18.0 %. The global longitudinal strain is normal. The left ventricular internal cavity size was normal in size.  There is no left ventricular hypertrophy. Left ventricular diastolic parameters are consistent with Grade I diastolic dysfunction (impaired relaxation). Right Ventricle: The right ventricular size is normal. No increase in right ventricular wall thickness. Right ventricular systolic function is normal. Left Atrium: Left atrial size was normal in size. Right  Atrium: Right atrial size was normal in size. Pericardium: There is no evidence of pericardial effusion. Mitral Valve: The mitral valve is normal in structure. No evidence of mitral valve regurgitation. No evidence of mitral valve stenosis. MV peak gradient, 2.4 mmHg. The mean mitral valve gradient is 1.0 mmHg. Tricuspid Valve: The tricuspid valve is normal in structure. Tricuspid valve regurgitation is not demonstrated. No evidence of tricuspid stenosis. Aortic Valve: The aortic valve was not well visualized. Aortic valve regurgitation is not visualized. No aortic stenosis is present. Aortic valve mean gradient measures 3.0 mmHg. Aortic valve peak gradient measures 5.4 mmHg. Aortic valve area, by VTI measures 2.93 cm. Pulmonic Valve: The pulmonic valve was normal in structure. Pulmonic valve regurgitation is not visualized. No evidence of pulmonic stenosis. Aorta: The aortic root is normal in size and structure. Venous: The inferior vena cava is normal in size with greater than 50% respiratory variability, suggesting right atrial pressure of 3 mmHg. IAS/Shunts: No atrial level shunt detected by color flow Doppler.  LEFT VENTRICLE PLAX 2D LVIDd:         4.03 cm   Diastology LVIDs:         2.25 cm   LV e' medial:    6.09 cm/s LV PW:         0.96 cm   LV E/e' medial:  11.1 LV IVS:        0.70 cm   LV e' lateral:   11.00 cm/s LVOT diam:     2.10 cm   LV E/e' lateral: 6.2 LV SV:         56 LV SV Index:   32        2D Longitudinal Strain LVOT Area:     3.46 cm  2D Strain GLS Avg:     -18.0 %  RIGHT VENTRICLE RV Basal diam:  3.68 cm LEFT ATRIUM             Index         RIGHT ATRIUM           Index LA diam:        2.70 cm 1.54 cm/m   RA Area:     13.10 cm LA Vol (A2C):   37.0 ml 21.16 ml/m  RA Volume:   31.20 ml  17.85 ml/m LA Vol (A4C):   25.1 ml 14.36 ml/m LA Biplane Vol: 31.8 ml 18.19 ml/m  AORTIC VALVE                    PULMONIC VALVE AV Area (Vmax):    2.77 cm     PV Vmax:       0.61 m/s AV Area (Vmean):   2.55 cm     PV Vmean:      41.500 cm/s AV Area (VTI):     2.93 cm     PV VTI:        0.105 m AV Vmax:           116.00 cm/s  PV Peak grad:  1.5 mmHg AV Vmean:          81.000 cm/s  PV Mean grad:  1.0 mmHg AV VTI:            0.190 m AV Peak Grad:      5.4 mmHg AV Mean Grad:      3.0 mmHg LVOT Vmax:         92.70 cm/s LVOT Vmean:  59.600 cm/s LVOT VTI:          0.161 m LVOT/AV VTI ratio: 0.85  AORTA Ao Root diam: 3.60 cm MITRAL VALVE MV Area (PHT): 3.97 cm    SHUNTS MV Area VTI:   2.77 cm    Systemic VTI:  0.16 m MV Peak grad:  2.4 mmHg    Systemic Diam: 2.10 cm MV Mean grad:  1.0 mmHg MV Vmax:       0.78 m/s MV Vmean:      55.6 cm/s MV Decel Time: 191 msec MV E velocity: 67.70 cm/s MV A velocity: 81.80 cm/s MV E/A ratio:  0.83 Ida Rogue MD Electronically signed by Ida Rogue MD Signature Date/Time: 11/10/2021/2:12:02 PM    Final       ASSESSMENT/PLAN   6cm left lung mass - patient and daughter Jared Mullen was present and we discussed everything together including imaging and biopsy plan.  - patient is agreeable to biopsy.    Thank you for allowing me to participate in the care of this patient.   Patient/Family are satisfied with care plan and all questions have been answered.  This document was prepared using Dragon voice recognition software and may include unintentional dictation errors.     Ottie Glazier, M.D.  Division of [place]

## 2021-11-12 NOTE — Transfer of Care (Signed)
Immediate Anesthesia Transfer of Care Note  Patient: Jared Mullen  Procedure(s) Performed: VIDEO BRONCHOSCOPY WITH ENDOBRONCHIAL ULTRASOUND (Bilateral)  Patient Location: PACU  Anesthesia Type:General  Level of Consciousness: awake  Airway & Oxygen Therapy: Patient Spontanous Breathing and Patient connected to face mask oxygen  Post-op Assessment: Report given to RN and Post -op Vital signs reviewed and stable  Post vital signs: Reviewed  Last Vitals:  Vitals Value Taken Time  BP    Temp 36.9 C 11/12/21 1441  Pulse    Resp    SpO2      Last Pain:  Vitals:   11/12/21 1258  TempSrc: Oral  PainSc: 0-No pain      Patients Stated Pain Goal: 0 (58/85/02 7741)  Complications: No notable events documented.

## 2021-11-12 NOTE — Anesthesia Preprocedure Evaluation (Addendum)
Anesthesia Evaluation  Patient identified by MRN, date of birth, ID band Patient awake    Reviewed: Allergy & Precautions, H&P , NPO status , Patient's Chart, lab work & pertinent test results, reviewed documented beta blocker date and time   History of Anesthesia Complications Negative for: history of anesthetic complications  Airway Mallampati: I  TM Distance: >3 FB Neck ROM: full    Dental  (+) Dental Advidsory Given, Poor Dentition, Missing   Pulmonary neg shortness of breath, sleep apnea and Continuous Positive Airway Pressure Ventilation , neg COPD, neg recent URI, former smoker (quit 1982),  Lung mass   Pulmonary exam normal breath sounds clear to auscultation       Cardiovascular Exercise Tolerance: Good negative cardio ROS Normal cardiovascular exam Rhythm:regular Rate:Normal  ECG 11/09/21:  Sinus rhythm Paired ventricular premature complexes Aberrant conduction of SV complex(es) Anteroseptal infarct, age indeterminate ST elevation, consider inferior injury  Echo 11/10/21:  1. Left ventricular ejection fraction, by estimation, is 55 to 60%. The left ventricle has normal function. The left ventricle has no regional wall motion abnormalities. Left ventricular diastolic parameters are consistent with Grade I diastolic dysfunction (impaired relaxation). The average left ventricular global longitudinal strain is -18.0 %. The global longitudinal strain is normal.  2. Right ventricular systolic function is normal. The right ventricular size is normal.  3. The mitral valve is normal in structure. No evidence of mitral valve regurgitation. No evidence of mitral stenosis.  4. The aortic valve was not well visualized. Aortic valve regurgitation is not visualized. No aortic stenosis is present.  5. The inferior vena cava is normal in size with greater than 50% respiratory variability, suggesting right atrial pressure of 3 mmHg.     Neuro/Psych negative neurological ROS  negative psych ROS   GI/Hepatic negative GI ROS, Neg liver ROS,   Endo/Other  negative endocrine ROS  Renal/GU negative Renal ROS   Prostate CA negative genitourinary   Musculoskeletal   Abdominal   Peds  Hematology negative hematology ROS (+)   Anesthesia Other Findings Cardiology note 11/12/21:  81 y.o. male w/ a h/o prostate cancer and sleep apnea, who presented 1/23 w/ a 6-12 month h/o cough, peri-prandial abd pain/nausea, weakness, and findings of elevated HsTrop and L lung mass  Assessment & Plan   Elevated troponin/demand ischemia - no prior cardiac history - admitted with cough, cold, nausea 1/23 also has lost 30lbs - HS trop noted to peak at 491 - EKG showed inferolateral ST elevation and prior septal infarct w/ TWI, similar to old EKGs - Echo showed LVEF 55-60%, no WMA, no sig valvular dz - He has DOE associated with nasuea and abd pain. No chest pain reported.  - Given lack of anginal symptoms and HS troponin trend not consistent with ACS IV heparin stopped - No plan for ischemic work-up during hospitalization, can be as OP - continue ASA, statin and low dose BB  L lung mass with lytic lesion in R iliac bone - oncology following, suspect lung cancer - plan for bx and OP f/u   Reproductive/Obstetrics negative OB ROS                            Anesthesia Physical Anesthesia Plan  ASA: 3  Anesthesia Plan: General   Post-op Pain Management:    Induction: Intravenous  PONV Risk Score and Plan: 2 and Ondansetron, Dexamethasone and Treatment may vary due to age or medical condition  Airway Management Planned: Oral ETT  Additional Equipment:   Intra-op Plan:   Post-operative Plan: Extubation in OR  Informed Consent: I have reviewed the patients History and Physical, chart, labs and discussed the procedure including the risks, benefits and alternatives for the proposed anesthesia with  the patient or authorized representative who has indicated his/her understanding and acceptance.     Dental advisory given  Plan Discussed with: CRNA  Anesthesia Plan Comments: (Patient consented for risks of anesthesia including but not limited to:  - adverse reactions to medications - damage to eyes, teeth, lips or other oral mucosa - nerve damage due to positioning  - sore throat or hoarseness - damage to heart, brain, nerves, lungs, other parts of body or loss of life  Informed patient about role of CRNA in peri- and intra-operative care.  Patient voiced understanding.)        Anesthesia Quick Evaluation

## 2021-11-12 NOTE — Anesthesia Procedure Notes (Signed)
Procedure Name: Intubation Date/Time: 11/12/2021 1:43 PM Performed by: Rolla Plate, CRNA Pre-anesthesia Checklist: Patient identified, Patient being monitored, Timeout performed, Emergency Drugs available and Suction available Patient Re-evaluated:Patient Re-evaluated prior to induction Oxygen Delivery Method: Circle system utilized Preoxygenation: Pre-oxygenation with 100% oxygen Induction Type: IV induction Ventilation: Mask ventilation without difficulty Laryngoscope Size: McGraph and 4 Grade View: Grade I Tube type: Oral Tube size: 9.0 mm Number of attempts: 1 Airway Equipment and Method: Stylet and Video-laryngoscopy Placement Confirmation: ETT inserted through vocal cords under direct vision, positive ETCO2 and breath sounds checked- equal and bilateral Secured at: 24 cm Tube secured with: Tape Dental Injury: Teeth and Oropharynx as per pre-operative assessment

## 2021-11-12 NOTE — Progress Notes (Signed)
PROGRESS NOTE    Jared Mullen  BJY:[id number] DOB: 01/29/1941 DOA: 11/09/2021 PCP: Birdie Sons, MD   Assessment & Plan:   Principal Problem:   Abdominal pain Active Problems:   Lung mass   Demand ischemia (HCC)   Elevated troponin   Nausea   Prostate cancer (HCC)   Protein-calorie malnutrition, severe   Palliative care encounter  Fever: etiology unclear. Blood cxs NGTD   Elevated troponins: likely secondary to demand ischemia. No chest pain. Echo shows normal EF. Continue on metoprolol, statin   Large lung mass: w/ lymph nodes. MRI brain was negative for metastases. Will go for lung mass biopsy today as per pulmon   Hx of prostate cancer: management as per onco   Cyst on liver: on previous MRI. Continue outpatient surveillance   Hyponatremia: resolved   Severe protein calorie malnutrition: continue on nutritional supplements   DVT prophylaxis: SCDs Code Status: full  Family Communication: discussed pt's daughter, Linus Orn, at bedside and answered her questions Disposition Plan: depends on PT/OT recs   Level of care: Med-Surg  Status is: Inpatient  Remains inpatient appropriate because: severity of illness, will go for lung mass biopsy     Consultants:  Onco Pulmon   Procedures:   Antimicrobials:  Subjective: Pt c/o malaise   Objective: Vitals:   11/11/21 1509 11/11/21 1922 11/12/21 0424 11/12/21 0738  BP: 118/82 132/63 110/69 114/69  Pulse: 73 81 74 73  Resp:  20 20 18   Temp: 98.8 F (37.1 C) 99.1 F (37.3 C) (!) 97.5 F (36.4 C) 98.5 F (36.9 C)  TempSrc: Oral Oral Oral Oral  SpO2: 97% 98% 97% 97%  Weight:      Height:        Intake/Output Summary (Last 24 hours) at 11/12/2021 0800 Last data filed at 11/12/2021 0865 Gross per 24 hour  Intake 300 ml  Output 1550 ml  Net -1250 ml   Filed Weights   11/09/21 1139 11/09/21 2110  Weight: 61.2 kg 61.2 kg    Examination:  General exam: Appears comfortable  Respiratory system: clear  breath sounds b/l  Cardiovascular system: S1/S2+. No rubs or clicks  Gastrointestinal system: Abd is soft, NT, ND & hypoactive bowel sounds  Central nervous system: alert and oriented. Moves all extremities   Psychiatry: Judgement and insight appears normal. Flat mood and affect     Data Reviewed: I have personally reviewed following labs and imaging studies  CBC: Recent Labs  Lab 11/09/21 1536 11/10/21 0143 11/11/21 0505 11/12/21 0451  WBC 7.7 6.9 8.1 8.0  NEUTROABS 5.6  --   --   --   HGB 14.1 13.2 12.5* 12.8*  HCT 42.0 38.9* 37.5* 38.5*  MCV 88.6 88.0 88.7 88.1  PLT 215 199 184 784   Basic Metabolic Panel: Recent Labs  Lab 11/09/21 1536 11/11/21 0505 11/12/21 0451  NA 132* 135 134*  K 4.5 3.9 4.1  CL 96* 100 99  CO2 29 27 28   GLUCOSE 122* 112* 116*  BUN 16 16 14   CREATININE 0.72 0.72 0.75  CALCIUM 9.4 8.7* 9.0   GFR: Estimated Creatinine Clearance: 61.6 mL/min (by C-G formula based on SCr of 0.75 mg/dL). Liver Function Tests: Recent Labs  Lab 11/09/21 1536  AST 17  ALT 13  ALKPHOS 71  BILITOT 1.2  PROT 7.4  ALBUMIN 3.5   Recent Labs  Lab 11/09/21 1536  LIPASE 26   No results for input(s): AMMONIA in the last 168 hours. Coagulation Profile: No  results for input(s): INR, PROTIME in the last 168 hours. Cardiac Enzymes: No results for input(s): CKTOTAL, CKMB, CKMBINDEX, TROPONINI in the last 168 hours. BNP (last 3 results) No results for input(s): PROBNP in the last 8760 hours. HbA1C: No results for input(s): HGBA1C in the last 72 hours. CBG: No results for input(s): GLUCAP in the last 168 hours. Lipid Profile: Recent Labs    11/09/21 1536  CHOL 134  HDL 41  LDLCALC 86  TRIG 34  CHOLHDL 3.3   Thyroid Function Tests: Recent Labs    11/09/21 1536  TSH 1.767   Anemia Panel: No results for input(s): VITAMINB12, FOLATE, FERRITIN, TIBC, IRON, RETICCTPCT in the last 72 hours. Sepsis Labs: Recent Labs  Lab 11/09/21 2028 11/10/21 1826  11/11/21 0505 11/12/21 0451  PROCALCITON 0.10 <0.10 0.10 <0.10    Recent Results (from the past 240 hour(s))  Resp Panel by RT-PCR (Flu A&B, Covid) Nasopharyngeal Swab     Status: None   Collection Time: 11/09/21  5:34 PM   Specimen: Nasopharyngeal Swab; Nasopharyngeal(NP) swabs in vial transport medium  Result Value Ref Range Status   SARS Coronavirus 2 by RT PCR NEGATIVE NEGATIVE Final    Comment: (NOTE) SARS-CoV-2 target nucleic acids are NOT DETECTED.  The SARS-CoV-2 RNA is generally detectable in upper respiratory specimens during the acute phase of infection. The lowest concentration of SARS-CoV-2 viral copies this assay can detect is 138 copies/mL. A negative result does not preclude SARS-Cov-2 infection and should not be used as the sole basis for treatment or other patient management decisions. A negative result may occur with  improper specimen collection/handling, submission of specimen other than nasopharyngeal swab, presence of viral mutation(s) within the areas targeted by this assay, and inadequate number of viral copies(<138 copies/mL). A negative result must be combined with clinical observations, patient history, and epidemiological information. The expected result is Negative.  Fact Sheet for Patients:  EntrepreneurPulse.com.au  Fact Sheet for Healthcare Providers:  IncredibleEmployment.be  This test is no t yet approved or cleared by the [place] FDA and  has been authorized for detection and/or diagnosis of SARS-CoV-2 by FDA under an Emergency Use Authorization (EUA). This EUA will remain  in effect (meaning this test can be used) for the duration of the COVID-19 declaration under Section 564(b)(1) of the Act, 21 U.S.C.section 360bbb-3(b)(1), unless the authorization is terminated  or revoked sooner.       Influenza A by PCR NEGATIVE NEGATIVE Final   Influenza B by PCR NEGATIVE NEGATIVE Final    Comment:  (NOTE) The Xpert Xpress SARS-CoV-2/FLU/RSV plus assay is intended as an aid in the diagnosis of influenza from Nasopharyngeal swab specimens and should not be used as a sole basis for treatment. Nasal washings and aspirates are unacceptable for Xpert Xpress SARS-CoV-2/FLU/RSV testing.  Fact Sheet for Patients: EntrepreneurPulse.com.au  Fact Sheet for Healthcare Providers: IncredibleEmployment.be  This test is not yet approved or cleared by the [place] FDA and has been authorized for detection and/or diagnosis of SARS-CoV-2 by FDA under an Emergency Use Authorization (EUA). This EUA will remain in effect (meaning this test can be used) for the duration of the COVID-19 declaration under Section 564(b)(1) of the Act, 21 U.S.C. section 360bbb-3(b)(1), unless the authorization is terminated or revoked.  Performed at [facility], [address] [place], [place] [address]   CULTURE, BLOOD (ROUTINE X 2) w Reflex to ID Panel     Status: None (Preliminary result)   Collection Time: 11/10/21  6:26 PM   Specimen: BLOOD  Result Value Ref Range Status   Specimen Description BLOOD BLOOD RIGHT WRIST  Final   Special Requests BOTTLES DRAWN AEROBIC AND ANAEROBIC BCAV  Final   Culture   Final    NO GROWTH 2 DAYS Performed at [facility], [address] [place], [place] [address]    Report Status PENDING  Incomplete  CULTURE, BLOOD (ROUTINE X 2) w Reflex to ID Panel     Status: None (Preliminary result)   Collection Time: 11/10/21  7:19 PM   Specimen: BLOOD  Result Value Ref Range Status   Specimen Description BLOOD BLOOD RIGHT HAND  Final   Special Requests BOTTLES DRAWN AEROBIC AND ANAEROBIC BCAV  Final   Culture   Final    NO GROWTH 2 DAYS Performed at [facility], [address] [place], [place] [address]    Report Status PENDING  Incomplete         Radiology Studies: MR BRAIN W WO CONTRAST  Result  Date: 11/10/2021 CLINICAL DATA:  Metastatic disease evaluation, lung mass EXAM: MRI HEAD WITHOUT AND WITH CONTRAST TECHNIQUE: Multiplanar, multiecho pulse sequences of the brain and surrounding structures were obtained without and with intravenous contrast. CONTRAST:  81mL GADAVIST GADOBUTROL 1 MMOL/ML IV SOLN COMPARISON:  None. FINDINGS: Brain: There is no acute infarction or intracranial hemorrhage. There is no intracranial mass, mass effect, or edema. There is no hydrocephalus or extra-axial fluid collection. Prominence of the ventricles and sulci reflects parenchymal volume loss. Patchy T2 hyperintensity in the supratentorial white matter is nonspecific but may reflect minor chronic microvascular ischemic changes. No abnormal enhancement. Vascular: Major vessel flow voids at the skull base are preserved. Skull and upper cervical spine: Normal marrow signal is preserved. Sinuses/Orbits: Paranasal sinus mucosal thickening. Orbits are unremarkable. Other: Sella is unremarkable.  Mastoid air cells are clear. IMPRESSION: No evidence of intracranial metastatic disease. Electronically Signed   By: Macy Mis M.D.   On: 11/10/2021 14:37   CT ABDOMEN PELVIS W CONTRAST  Result Date: 11/11/2021 CLINICAL DATA:  Non-small cell lung cancer staging. EXAM: CT ABDOMEN AND PELVIS WITH CONTRAST TECHNIQUE: Multidetector CT imaging of the abdomen and pelvis was performed using the standard protocol following bolus administration of intravenous contrast. RADIATION DOSE REDUCTION: This exam was performed according to the departmental dose-optimization program which includes automated exposure control, adjustment of the mA and/or kV according to patient size and/or use of iterative reconstruction technique. CONTRAST:  21mL OMNIPAQUE IOHEXOL 300 MG/ML  SOLN COMPARISON:  Chest CTA 11/09/2021, abdominal MRI 06/22/2019 and abdominal ultrasound 01/02/2018. FINDINGS: Lower chest: Calcified pleural plaque, pleural thickening and linear  scarring at both lung bases. The known left hilar mass is not imaged on this abdominal CT. No significant pleural or pericardial effusion. Aortic and coronary artery atherosclerosis are noted. Hepatobiliary: Numerous hepatic cysts are again noted, similar to previous MRI. No suspicious liver lesions are identified. There is no significant biliary dilatation status post cholecystectomy. Pancreas: Unremarkable. No pancreatic ductal dilatation or surrounding inflammatory changes. Spleen: Normal in size without focal abnormality. Adrenals/Urinary Tract: Both adrenal glands appear normal. Small bilateral renal cysts. No evidence of renal mass, urinary tract calculus or hydronephrosis. The bladder appears unremarkable. Stomach/Bowel: Enteric contrast was administered and has passed into the rectum. The stomach appears unremarkable for its degree of distension. No evidence of bowel wall thickening, distention or surrounding inflammatory change. The appendix appears normal. Moderate stool throughout the colon. Vascular/Lymphatic: There are no enlarged abdominal or  pelvic lymph nodes. Diffuse aortic and branch vessel atherosclerosis. No evidence of aneurysm or large vessel occlusion. Reproductive: Multiple brachytherapy seeds are present within the prostate gland which otherwise appears unremarkable. Other: No ascites or peritoneal nodularity. Musculoskeletal: There is a lytic destructive mass within the right iliac bone with an associated soft tissue mass, highly suspicious for metastatic disease. This measures 3.7 x 3.2 cm on image 59/2. No other suspicious osseous findings are seen. There are degenerative changes throughout the lumbar spine which contribute to mild spinal stenosis and foraminal narrowing at the lower 4 disc space levels. IMPRESSION: 1. 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 As this patient also has a remote history of prostate cancer,  correlation with serum PSA levels recommended, although there are no blastic lesions. 2. No other evidence of metastatic disease in the abdomen or pelvis. 3. Innumerable hepatic cysts, similar to previous MRI. Small renal cysts. 4. Multilevel lumbar spondylosis. 5. Aortic Atherosclerosis (ICD10-I70.0) and Emphysema (ICD10-J43.9). Electronically Signed   By: Richardean Sale M.D.   On: 11/11/2021 09:10   ECHOCARDIOGRAM COMPLETE  Result Date: 11/10/2021    ECHOCARDIOGRAM REPORT   Patient Name:   MAJESTIC BRISTER Date of Exam: 11/10/2021 Medical Rec #:  [id number]   Height:       69.0 in Accession #:    [id number]  Weight:       135.0 lb Date of Birth:  May 12, 1941    BSA:          1.748 m Patient Age:    58 years    BP:           129/84 mmHg Patient Gender: M           HR:           78 bpm. Exam Location:  [facility] Procedure: 2D Echo, Color Doppler, Cardiac Doppler and Strain Analysis Indications:     R07.9 Chest Pain  History:         Patient has no prior history of Echocardiogram examinations.                  Risk Factors:Sleep Apnea.  Sonographer:     Charmayne Sheer Referring Phys:  [place] Diagnosing Phys: Ida Rogue MD  Sonographer Comments: Suboptimal parasternal window. Global longitudinal strain was attempted. IMPRESSIONS  1. Left ventricular ejection fraction, by estimation, is 55 to 60%. The left ventricle has normal function. The left ventricle has no regional wall motion abnormalities. Left ventricular diastolic parameters are consistent with Grade I diastolic dysfunction (impaired relaxation). The average left ventricular global longitudinal strain is -18.0 %. The global longitudinal strain is normal.  2. Right ventricular systolic function is normal. The right ventricular size is normal.  3. The mitral valve is normal in structure. No evidence of mitral valve regurgitation. No evidence of mitral stenosis.  4. The aortic valve was not well visualized. Aortic valve regurgitation is not visualized. No  aortic stenosis is present.  5. The inferior vena cava is normal in size with greater than 50% respiratory variability, suggesting right atrial pressure of 3 mmHg. FINDINGS  Left Ventricle: Left ventricular ejection fraction, by estimation, is 55 to 60%. The left ventricle has normal function. The left ventricle has no regional wall motion abnormalities. The average left ventricular global longitudinal strain is -18.0 %. The global longitudinal strain is normal. The left ventricular internal cavity size was normal in size. There is  no left ventricular hypertrophy. Left ventricular diastolic parameters are consistent with Grade I diastolic dysfunction (impaired relaxation). Right Ventricle: The right ventricular size is normal. No increase in right ventricular wall thickness. Right ventricular systolic function is normal. Left Atrium: Left atrial size was normal in size. Right Atrium: Right atrial size was normal in size. Pericardium: There is no evidence of pericardial effusion. Mitral Valve: The mitral valve is normal in structure. No evidence of mitral valve regurgitation. No evidence of mitral valve stenosis. MV peak gradient, 2.4 mmHg. The mean mitral valve gradient is 1.0 mmHg. Tricuspid Valve: The tricuspid valve is normal in structure. Tricuspid valve regurgitation is not demonstrated. No evidence of tricuspid stenosis. Aortic Valve: The aortic valve was not well visualized. Aortic valve regurgitation is not visualized. No aortic stenosis is present. Aortic valve mean gradient measures 3.0 mmHg. Aortic valve peak gradient measures 5.4 mmHg. Aortic valve area, by VTI measures 2.93 cm. Pulmonic Valve: The pulmonic valve was normal in structure. Pulmonic valve regurgitation is not visualized. No evidence of pulmonic stenosis. Aorta: The aortic root is normal in size and structure. Venous: The inferior vena cava is normal in size with greater than 50% respiratory variability, suggesting right atrial pressure of  3 mmHg. IAS/Shunts: No atrial level shunt detected by color flow Doppler.  LEFT VENTRICLE PLAX 2D LVIDd:         4.03 cm   Diastology LVIDs:         2.25 cm   LV e' medial:    6.09 cm/s LV PW:         0.96 cm   LV E/e' medial:  11.1 LV IVS:        0.70 cm   LV e' lateral:   11.00 cm/s LVOT diam:     2.10 cm   LV E/e' lateral: 6.2 LV SV:         56 LV SV Index:   32        2D Longitudinal Strain LVOT Area:     3.46 cm  2D Strain GLS Avg:     -18.0 %  RIGHT VENTRICLE RV Basal diam:  3.68 cm LEFT ATRIUM             Index        RIGHT ATRIUM           Index LA diam:        2.70 cm 1.54 cm/m   RA Area:     13.10 cm LA Vol (A2C):   37.0 ml 21.16 ml/m  RA Volume:   31.20 ml  17.85 ml/m LA Vol (A4C):   25.1 ml 14.36 ml/m LA Biplane Vol: 31.8 ml 18.19 ml/m  AORTIC VALVE                    PULMONIC VALVE AV Area (Vmax):    2.77 cm     PV Vmax:       0.61 m/s AV Area (Vmean):   2.55 cm     PV Vmean:      41.500 cm/s AV Area (VTI):     2.93 cm     PV VTI:        0.105 m AV Vmax:           116.00 cm/s  PV Peak grad:  1.5 mmHg AV Vmean:          81.000 cm/s  PV Mean grad:  1.0 mmHg AV VTI:  0.190 m AV Peak Grad:      5.4 mmHg AV Mean Grad:      3.0 mmHg LVOT Vmax:         92.70 cm/s LVOT Vmean:        59.600 cm/s LVOT VTI:          0.161 m LVOT/AV VTI ratio: 0.85  AORTA Ao Root diam: 3.60 cm MITRAL VALVE MV Area (PHT): 3.97 cm    SHUNTS MV Area VTI:   2.77 cm    Systemic VTI:  0.16 m MV Peak grad:  2.4 mmHg    Systemic Diam: 2.10 cm MV Mean grad:  1.0 mmHg MV Vmax:       0.78 m/s MV Vmean:      55.6 cm/s MV Decel Time: 191 msec MV E velocity: 67.70 cm/s MV A velocity: 81.80 cm/s MV E/A ratio:  0.83 Ida Rogue MD Electronically signed by Ida Rogue MD Signature Date/Time: 11/10/2021/2:12:02 PM    Final         Scheduled Meds:  atorvastatin  80 mg Oral Daily   feeding supplement  237 mL Oral TID BM   melatonin  5 mg Oral QHS   metoprolol tartrate  12.5 mg Oral BID   multivitamin with minerals   1 tablet Oral Daily   pantoprazole (PROTONIX) IV  40 mg Intravenous Q12H   Continuous Infusions:     LOS: 2 days    Time spent: 25 mins    Wyvonnia Dusky, MD [facility] Pager [phone]  If 7PM-7AM, please contact night-coverage 11/12/2021, 8:00 AM

## 2021-11-13 ENCOUNTER — Other Ambulatory Visit: Payer: Self-pay | Admitting: Anatomic Pathology & Clinical Pathology

## 2021-11-13 ENCOUNTER — Encounter: Payer: Self-pay | Admitting: Pulmonary Disease

## 2021-11-13 DIAGNOSIS — Z7189 Other specified counseling: Secondary | ICD-10-CM

## 2021-11-13 DIAGNOSIS — R110 Nausea: Secondary | ICD-10-CM | POA: Diagnosis not present

## 2021-11-13 DIAGNOSIS — R1013 Epigastric pain: Secondary | ICD-10-CM | POA: Diagnosis not present

## 2021-11-13 DIAGNOSIS — R918 Other nonspecific abnormal finding of lung field: Secondary | ICD-10-CM | POA: Diagnosis not present

## 2021-11-13 LAB — CYTOLOGY - NON PAP

## 2021-11-13 LAB — CBC
HCT: 40.4 % (ref 39.0–52.0)
Hemoglobin: 13.5 g/dL (ref 13.0–17.0)
MCH: 29.1 pg (ref 26.0–34.0)
MCHC: 33.4 g/dL (ref 30.0–36.0)
MCV: 87.1 fL (ref 80.0–100.0)
Platelets: 228 10*3/uL (ref 150–400)
RBC: 4.64 MIL/uL (ref 4.22–5.81)
RDW: 13.3 % (ref 11.5–15.5)
WBC: 8 10*3/uL (ref 4.0–10.5)
nRBC: 0 % (ref 0.0–0.2)

## 2021-11-13 LAB — BASIC METABOLIC PANEL
Anion gap: 6 (ref 5–15)
BUN: 21 mg/dL (ref 8–23)
CO2: 27 mmol/L (ref 22–32)
Calcium: 9.3 mg/dL (ref 8.9–10.3)
Chloride: 101 mmol/L (ref 98–111)
Creatinine, Ser: 0.75 mg/dL (ref 0.61–1.24)
GFR, Estimated: >60 mL/min (ref >60)
Glucose, Bld: 177 mg/dL — ABNORMAL HIGH (ref 70–99)
Potassium: 4.6 mmol/L (ref 3.5–5.1)
Sodium: 134 mmol/L — ABNORMAL LOW (ref 135–145)

## 2021-11-13 LAB — URINE CULTURE: Culture: <10000 — AB

## 2021-11-13 LAB — SURGICAL PATHOLOGY

## 2021-11-13 MED ORDER — PANTOPRAZOLE SODIUM 40 MG PO TBEC
40.0000 mg | DELAYED_RELEASE_TABLET | Freq: Two times a day (BID) | ORAL | Status: DC
  Administered 2021-11-13 – 2021-11-17 (×8): 40 mg via ORAL
  Filled 2021-11-13 (×8): qty 1

## 2021-11-13 NOTE — NC FL2 (Signed)
[place] LEVEL OF CARE SCREENING TOOL     IDENTIFICATION  Patient Name: Jared Mullen Birthdate: 05/04/1941 Sex: male Admission Date (Current Location): 11/09/2021  [facility] and [place] Number:  Engineering geologist and Address:         Provider Number: [id number]  Attending Physician Name and Address:  Wyvonnia Dusky, MD  Relative Name and Phone Number:       Current Level of Care: Hospital Recommended Level of Care: [place] Prior Approval Number:    Date Approved/Denied:   PASRR Number: [id number]  Discharge Plan: SNF    Current Diagnoses: Patient Active Problem List   Diagnosis Date Noted   Protein-calorie malnutrition, severe 11/11/2021   Palliative care encounter    Demand ischemia ([place]) 11/10/2021   Elevated troponin    Nausea    Prostate cancer ([place])    Abdominal pain 11/09/2021   Lung mass 11/09/2021   NSTEMI (non-ST elevated myocardial infarction) ([place]) 11/09/2021   Osteopenia 12/15/2020   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   Seborrhea capitis 04/24/2015   H/O adenomatous polyp of colon 11/18/2011   History of prostate cancer 11/04/2011    Orientation RESPIRATION BLADDER Height & Weight     Self, Time, Situation, Place  Normal Continent Weight: 61.2 kg Height:  5\' 9"  (175.3 cm)  BEHAVIORAL SYMPTOMS/MOOD NEUROLOGICAL BOWEL NUTRITION STATUS      Continent Diet (Regular)  AMBULATORY STATUS COMMUNICATION OF NEEDS Skin   Extensive Assist Verbally Normal                       Personal Care Assistance Level of Assistance              Functional Limitations Info             SPECIAL CARE FACTORS FREQUENCY  PT (By licensed PT), OT (By licensed OT)                    Contractures Contractures Info: Not present    Additional Factors Info  Code Status, Allergies Code Status Info: Full Allergies Info: NKDA           Current Medications (11/13/2021):  This is the current  hospital active medication list Current Facility-Administered Medications  Medication Dose Route Frequency Provider Last Rate Last Admin   acetaminophen (TYLENOL) tablet 650 mg  650 mg Oral Q6H PRN Para Skeans, MD   650 mg at 11/10/21 1533   Or   acetaminophen (TYLENOL) suppository 650 mg  650 mg Rectal Q6H PRN Para Skeans, MD       atorvastatin (LIPITOR) tablet 80 mg  80 mg Oral Daily Theora Gianotti, NP   80 mg at 11/13/21 0941   feeding supplement (ENSURE ENLIVE / ENSURE PLUS) liquid 237 mL  237 mL Oral TID BM Loletha Grayer, MD   237 mL at 11/13/21 0944   hydrALAZINE (APRESOLINE) injection 5 mg  5 mg Intravenous Q6H PRN Para Skeans, MD       HYDROcodone-acetaminophen (NORCO/VICODIN) 5-325 MG per tablet 1 tablet  1 tablet Oral Q6H PRN Borders, Kirt Boys, NP       melatonin tablet 5 mg  5 mg Oral QHS Florina Ou V, MD   5 mg at 11/12/21 2108   metoprolol tartrate (LOPRESSOR) tablet 12.5 mg  12.5 mg Oral BID Theora Gianotti, NP   12.5 mg at 11/13/21 0942   morphine 2  MG/ML injection 1 mg  1 mg Intravenous Q6H PRN Para Skeans, MD   1 mg at 11/12/21 2109   multivitamin with minerals tablet 1 tablet  1 tablet Oral Daily Loletha Grayer, MD   1 tablet at 11/13/21 0941   nitroGLYCERIN (NITROSTAT) SL tablet 0.4 mg  0.4 mg Sublingual Q5 min PRN Para Skeans, MD       ondansetron [facility]) injection 4 mg  4 mg Intravenous Q6H PRN Loletha Grayer, MD   4 mg at 11/11/21 1825   pantoprazole (PROTONIX) EC tablet 40 mg  40 mg Oral BID Dallie Piles, RPH       senna-docusate (Senokot-S) tablet 1 tablet  1 tablet Oral QHS PRN Borders, Kirt Boys, NP   1 tablet at 11/12/21 2122     Discharge Medications: Please see discharge summary for a list of discharge medications.  Relevant Imaging Results:  Relevant Lab Results:   Additional Information ss [id number]  Beverly Sessions, RN

## 2021-11-13 NOTE — Anesthesia Postprocedure Evaluation (Signed)
Anesthesia Post Note  Patient: Jared Mullen  Procedure(s) Performed: VIDEO BRONCHOSCOPY WITH ENDOBRONCHIAL ULTRASOUND (Bilateral)  Patient location during evaluation: PACU Anesthesia Type: General Level of consciousness: awake and alert Pain management: pain level controlled Vital Signs Assessment: post-procedure vital signs reviewed and stable Respiratory status: spontaneous breathing, nonlabored ventilation, respiratory function stable and patient connected to nasal cannula oxygen Cardiovascular status: blood pressure returned to baseline and stable Postop Assessment: no apparent nausea or vomiting Anesthetic complications: no   No notable events documented.   Last Vitals:  Vitals:   11/13/21 0828 11/13/21 1540  BP: 119/77 (!) 157/88  Pulse: 72 76  Resp: 18 18  Temp: (!) 36.3 C 36.9 C  SpO2: 96% 98%    Last Pain:  Vitals:   11/13/21 0945  TempSrc:   PainSc: 0-No pain                 Martha Clan

## 2021-11-13 NOTE — TOC Initial Note (Signed)
Transition of Care [facility]) - Initial/Assessment Note    Patient Details  Name: Jared Mullen MRN: [id number] Date of Birth: Nov 02, 1940  [facility] CM/SW Contact:    Beverly Sessions, RN Phone Number: 11/13/2021, 1:27 PM  Clinical Narrative:                 Admitted for: Weakness, lung mass Admitted from:home alone PCP: [place] - Baseline patient drives himself to appointments  Pharmacy: [facility] Current home health/prior home health/DME: [place], [place] recommending SNF Patient and daughter in agreement Obtained [place] pending Bed search initiated    Expected Discharge Plan: [place] Barriers to Discharge: Continued Medical Work up   Patient Goals and CMS Choice        Expected Discharge Plan and Services Expected Discharge Plan: [place]       Living arrangements for the past 2 months: [place] Expected Discharge Date: 11/11/21                                    Prior Living Arrangements/Services Living arrangements for the past 2 months: Single Family Home Lives with:: Self Patient language and need for interpreter reviewed:: Yes Do you feel safe going back to the place where you live?: Yes      Need for Family Participation in Patient Care: Yes (Comment) Care giver support system in place?: Yes (comment) Current home services: DME Criminal Activity/Legal Involvement Pertinent to Current Situation/Hospitalization: No - Comment as needed  Activities of Daily Living Home Assistive Devices/Equipment: Eyeglasses ADL Screening (condition at time of admission) Patient's cognitive ability adequate to safely complete daily activities?: Yes Is the patient deaf or have difficulty hearing?: No Does the patient have difficulty seeing, even when wearing glasses/contacts?: No Does the patient have difficulty concentrating, remembering, or making decisions?: No Patient able to express need for assistance  with ADLs?: Yes Does the patient have difficulty dressing or bathing?: No Independently performs ADLs?: Yes (appropriate for developmental age) Does the patient have difficulty walking or climbing stairs?: No Weakness of Legs: None Weakness of Arms/Hands: None  Permission Sought/Granted                  Emotional Assessment       Orientation: : Oriented to Self, Oriented to Place, Oriented to  Time, Oriented to Situation Alcohol / Substance Use: Not Applicable Psych Involvement: No (comment)  Admission diagnosis:  Nausea [R11.0] Lung mass [R91.8] NSTEMI (non-ST elevated myocardial infarction) ([place]) [I21.4] Abdominal pain [R10.9] Patient Active Problem List   Diagnosis Date Noted   Protein-calorie malnutrition, severe 11/11/2021   Palliative care encounter    Demand ischemia ([place]) 11/10/2021   Elevated troponin    Nausea    Prostate cancer ([place])    Abdominal pain 11/09/2021   Lung mass 11/09/2021   NSTEMI (non-ST elevated myocardial infarction) ([place]) 11/09/2021   Osteopenia 12/15/2020   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 [place] [address] Phone: [phone]  Fax: [phone]     Social Determinants of Health (SDOH) Interventions    Readmission Risk Interventions No flowsheet data found.

## 2021-11-13 NOTE — Plan of Care (Signed)

## 2021-11-13 NOTE — Evaluation (Signed)
Physical Therapy Evaluation Patient Details Name: Jared Mullen MRN: [id number] DOB: July 21, 1941 Today's Date: 11/13/2021  History of Present Illness  Pt is an 81 year old man with past medical history of prostate cancer status post radiation and seed implantation.  He presented with nausea and some weight loss and found to have an elevated troponin. MD assessment includes: Elevated troponins: likely secondary to demand ischemia, Hyponatremia, Severe protein calorie malnutrition, and L lung mass with lytic lesion in R iliac bone with new diagnosis of lung CA.   Clinical Impression  Pt was pleasant and motivated to participate during the session and put forth good effort throughout. Despite good effort pt required physical assistance with all functional tasks and presented with poor activity tolerance in standing. Pt's BLEs were often quite tremulous in standing and he was only able to take several very small, effortful, shuffling steps at the EOB with min A for stability. Pt will benefit from PT services in a SNF setting upon discharge to safely address deficits listed in patient problem list for decreased caregiver assistance and eventual return to PLOF.         Recommendations for follow up therapy are one component of a multi-disciplinary discharge planning process, led by the attending physician.  Recommendations may be updated based on patient status, additional functional criteria and insurance authorization.  Follow Up Recommendations Skilled nursing-short term rehab (<3 hours/day)    Assistance Recommended at Discharge Frequent or constant Supervision/Assistance  Patient can return home with the following  Two people to help with walking and/or transfers;A lot of help with bathing/dressing/bathroom;Direct supervision/assist for financial management;Help with stairs or ramp for entrance;Assistance with cooking/housework    Equipment Recommendations BSC/3in1  Recommendations for Other  Services       Functional Status Assessment Patient has had a recent decline in their functional status and demonstrates the ability to make significant improvements in function in a reasonable and predictable amount of time.     Precautions / Restrictions Precautions Precautions: Fall Restrictions Weight Bearing Restrictions: No      Mobility  Bed Mobility Overal bed mobility: Modified Independent             General bed mobility comments: Extra time and effort only    Transfers Overall transfer level: Needs assistance Equipment used: Rolling walker (2 wheels) Transfers: Sit to/from Stand Sit to Stand: Mod assist           General transfer comment: Mod verbal cues for sequencig for increased trunk flexion and hand placement and Mod A to come to full upright standing    Ambulation/Gait Ambulation/Gait assistance: Min assist Gait Distance (Feet): 2 Feet Assistive device: Rolling walker (2 wheels) Gait Pattern/deviations: Shuffle, Step-to pattern Gait velocity: decreased     General Gait Details: Pt only able to take several very small shuffling steps at the EOB with tremulous BLEs and min A for stability before needing to return to sitting  Stairs            Wheelchair Mobility    Modified Rankin (Stroke Patients Only)       Balance Overall balance assessment: Needs assistance   Sitting balance-Leahy Scale: Good     Standing balance support: Bilateral upper extremity supported, During functional activity Standing balance-Leahy Scale: Poor                               Pertinent Vitals/Pain Pain Assessment Pain Assessment: No/denies pain  Home Living Family/patient expects to be discharged to:: Private residence Living Arrangements: Alone Available Help at Discharge: Family;Available 24 hours/day Type of Home: House Home Access: Stairs to enter Entrance Stairs-Rails: Right Entrance Stairs-Number of Steps: 2   Home  Layout: Two level;Able to live on main level with bedroom/bathroom Home Equipment: Grab bars - toilet;Cane - single point;Rolling Walker (2 wheels)      Prior Function Prior Level of Function : Independent/Modified Independent             Mobility Comments: Ind amb without AD with occasional SPC PRN, no fall history ADLs Comments: Ind with ADLs     Hand Dominance        Extremity/Trunk Assessment   Upper Extremity Assessment Upper Extremity Assessment: Generalized weakness    Lower Extremity Assessment Lower Extremity Assessment: Generalized weakness       Communication   Communication: No difficulties  Cognition Arousal/Alertness: Awake/alert Behavior During Therapy: WFL for tasks assessed/performed Overall Cognitive Status: Within Functional Limits for tasks assessed                                          General Comments      Exercises Total Joint Exercises Ankle Circles/Pumps: AROM, Strengthening, Both, 10 reps Quad Sets: Strengthening, Both, 10 reps Gluteal Sets: Strengthening, Both, 10 reps Towel Squeeze: Strengthening, Both, 10 reps Straight Leg Raises: AROM, AAROM, Strengthening, Both, 5 reps Long Arc Quad: AROM, Strengthening, Both, 10 reps Knee Flexion: 10 reps, Both, Strengthening, AROM Other Exercises Other Exercises: Weight shifting L/R x 10 in standing Other Exercises: HEP education for BLE APs, QS, GS, and LAQs x 10 each every 1-2 hours daily   Assessment/Plan    PT Assessment Patient needs continued PT services  PT Problem List Decreased strength;Decreased activity tolerance;Decreased knowledge of use of DME;Decreased balance;Decreased mobility       PT Treatment Interventions DME instruction;Gait training;Stair training;Functional mobility training;Therapeutic activities;Therapeutic exercise;Balance training;Patient/family education    PT Goals (Current goals can be found in the Care Plan section)  Acute Rehab PT  Goals Patient Stated Goal: To get stronger and walk better PT Goal Formulation: With patient Time For Goal Achievement: 11/26/21 Potential to Achieve Goals: Good    Frequency Min 2X/week     Co-evaluation               AM-PAC PT "6 Clicks" Mobility  Outcome Measure Help needed turning from your back to your side while in a flat bed without using bedrails?: A Little Help needed moving from lying on your back to sitting on the side of a flat bed without using bedrails?: A Little Help needed moving to and from a bed to a chair (including a wheelchair)?: A Lot Help needed standing up from a chair using your arms (e.g., wheelchair or bedside chair)?: A Lot Help needed to walk in hospital room?: Total Help needed climbing 3-5 steps with a railing? : Total 6 Click Score: 12    End of Session Equipment Utilized During Treatment: Gait belt Activity Tolerance: Patient tolerated treatment well Patient left: in bed;with family/visitor present;with bed alarm set;with call bell/phone within reach Nurse Communication: Mobility status PT Visit Diagnosis: Unsteadiness on feet (R26.81);Difficulty in walking, not elsewhere classified (R26.2);Muscle weakness (generalized) (M62.81)    Time: 8119-1478 PT Time Calculation (min) (ACUTE ONLY): 38 min   Charges:   PT Evaluation $PT Eval Moderate  Complexity: 1 Mod PT Treatments $Therapeutic Exercise: 8-22 mins        D. Scott Tieasha Larsen PT, DPT 11/13/21, 12:00 PM

## 2021-11-13 NOTE — Progress Notes (Signed)
PULMONOLOGY         Date: 11/13/2021,   MRN# [id number] Jared Mullen 1941/05/30     AdmissionWeight: 61.2 kg                 CurrentWeight: 61.2 kg   Referring physician: Dr Jimmye Norman   CHIEF COMPLAINT:   Left lung mass   HISTORY OF PRESENT ILLNESS   Patient is an 81 yo M with hx of smoking from age 74 to 49 and has not smoked since then.  He has had cough for past 1 yr. He does admit to worsening cough and fatigue. He has left lung mass and we reviewed CT chest together today.   He underwent CT angio chest which showed left perihilar upper lobe masslike opacity measuring 6.6 x 5 x 6.1 cm contiguous with the hilum.  Margins are irregular and spiculated with linear extension into the periphery of the left upper lobe.  Findings concerning for primary bronchogenic malignancy.    11/12/21- Patient NPO scheduled for bronchoscopy noon today. No overnight events, good urine output , vitals stable on room air.   11/13/21- patient is stable with no overnight events. He had PT/OT today.  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 She had oncology evaluation with Dr Janese Banks today. Cytology is in process s/p bronchoscopy.    PAST MEDICAL HISTORY   Past Medical History:  Diagnosis Date   Abnormal ECG    a. baseline inferolateral ST elevation dating back to at least 2012.   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   Sleep apnea    Urinary frequency    Weak urinary stream    Weight loss      SURGICAL HISTORY   Past Surgical History:  Procedure Laterality Date   biospy  09/30/2011-   TRUS/Bx Prostate - 5/12/biopsies positive for cancer, glandular size 32.5 cc   CHOLECYSTECTOMY  1992   RADIOACTIVE SEED IMPLANT  02/09/2012   Procedure: RADIOACTIVE SEED IMPLANT;  Surgeon: Franchot Gallo, MD;  Location: [facility];   Service: Urology;  Laterality: N/A;  RAD TECH OK PER ANNE AT MAIN OR    UVULOPALATOPHARYNGOPLASTY       FAMILY HISTORY   Family History  Problem Relation Age of Onset   Heart disease Father    Breast cancer Sister    Cancer Sister        lung   Lung cancer Brother    Cancer Brother        bladder   Lung cancer Sister    Cancer Sister        breast   Kidney disease Brother      SOCIAL HISTORY   Social History   Tobacco Use   Smoking status: Former    Packs/day: 2.00    Years: 28.00    Pack years: 56.00    Types: Cigarettes    Quit date: 10/18/1980    Years since quitting: 41.0   Smokeless tobacco: Never  Substance Use Topics   Alcohol use: No   Drug use: No     MEDICATIONS    Home Medication:    Current Medication:  Current Facility-Administered Medications:    acetaminophen (TYLENOL) tablet 650 mg, 650 mg, Oral, Q6H PRN, 650 mg at 11/10/21 1533 **OR** acetaminophen (  TYLENOL) suppository 650 mg, 650 mg, Rectal, Q6H PRN, Para Skeans, MD   atorvastatin (LIPITOR) tablet 80 mg, 80 mg, Oral, Daily, Theora Gianotti, NP, 80 mg at 11/13/21 0941   feeding supplement (ENSURE ENLIVE / ENSURE PLUS) liquid 237 mL, 237 mL, Oral, TID BM, Wieting, Richard, MD, 237 mL at 11/13/21 0944   hydrALAZINE (APRESOLINE) injection 5 mg, 5 mg, Intravenous, Q6H PRN, Para Skeans, MD   HYDROcodone-acetaminophen (NORCO/VICODIN) 5-325 MG per tablet 1 tablet, 1 tablet, Oral, Q6H PRN, Borders, Kirt Boys, NP   melatonin tablet 5 mg, 5 mg, Oral, QHS, Florina Ou V, MD, 5 mg at 11/12/21 2108   metoprolol tartrate (LOPRESSOR) tablet 12.5 mg, 12.5 mg, Oral, BID, Theora Gianotti, NP, 12.5 mg at 11/13/21 9379   morphine 2 MG/ML injection 1 mg, 1 mg, Intravenous, Q6H PRN, Para Skeans, MD, 1 mg at 11/12/21 2109   multivitamin with minerals tablet 1 tablet, 1 tablet, Oral, Daily, Wieting, Richard, MD, 1 tablet at 11/13/21 0941   nitroGLYCERIN (NITROSTAT) SL tablet 0.4 mg, 0.4 mg,  Sublingual, Q5 min PRN, Para Skeans, MD   ondansetron [facility]) injection 4 mg, 4 mg, Intravenous, Q6H PRN, Loletha Grayer, MD, 4 mg at 11/11/21 1825   pantoprazole (PROTONIX) injection 40 mg, 40 mg, Intravenous, Q12H, Florina Ou V, MD, 40 mg at 11/13/21 0240   senna-docusate (Senokot-S) tablet 1 tablet, 1 tablet, Oral, QHS PRN, Borders, Kirt Boys, NP, 1 tablet at 11/12/21 2122    ALLERGIES   Patient has no known allergies.     REVIEW OF SYSTEMS    Review of Systems:  Gen:  Denies  fever, sweats, chills weigh loss  HEENT: Denies blurred vision, double vision, ear pain, eye pain, hearing loss, nose bleeds, sore throat Cardiac:  No dizziness, chest pain or heaviness, chest tightness,edema Resp:   Denies cough or sputum porduction, shortness of breath,wheezing, hemoptysis,  Gi: Denies swallowing difficulty, stomach pain, nausea or vomiting, diarrhea, constipation, bowel incontinence Gu:  Denies bladder incontinence, burning urine Ext:   Denies Joint pain, stiffness or swelling Skin: Denies  skin rash, easy bruising or bleeding or hives Endoc:  Denies polyuria, polydipsia , polyphagia or weight change Psych:   Denies depression, insomnia or hallucinations   Other:  All other systems negative   VS: BP 119/77 (BP Location: Left Arm)    Pulse 72    Temp (!) 97.4 F (36.3 C) (Oral)    Resp 18    Ht 5\' 9"  (1.753 m)    Wt 61.2 kg    SpO2 96%    BMI 19.94 kg/m      PHYSICAL EXAM    GENERAL:NAD, no fevers, chills, no weakness no fatigue HEAD: Normocephalic, atraumatic.  EYES: Pupils equal, round, reactive to light. Extraocular muscles intact. No scleral icterus.  MOUTH: Moist mucosal membrane. Dentition intact. No abscess noted.  EAR, NOSE, THROAT: Clear without exudates. No external lesions.  NECK: Supple. No thyromegaly. No nodules. No JVD.  PULMONARY: Diffuse coarse rhonchi right sided +wheezes CARDIOVASCULAR: S1 and S2. Regular rate and rhythm. No murmurs, rubs, or  gallops. No edema. Pedal pulses 2+ bilaterally.  GASTROINTESTINAL: Soft, nontender, nondistended. No masses. Positive bowel sounds. No hepatosplenomegaly.  MUSCULOSKELETAL: No swelling, clubbing, or edema. Range of motion full in all extremities.  NEUROLOGIC: Cranial nerves II through XII are intact. No gross focal neurological deficits. Sensation intact. Reflexes intact.  SKIN: No ulceration, lesions, rashes, or cyanosis. Skin warm and dry. Turgor intact.  PSYCHIATRIC: Mood, affect within normal limits. The patient is awake, alert and oriented x 3. Insight, judgment intact.       IMAGING    DG Chest 2 View  Result Date: 11/09/2021 CLINICAL DATA:  Nausea, belching decreased appetite for 2 months, history prostate cancer EXAM: CHEST - 2 VIEW COMPARISON:  03/07/2017 rib radiographs, chest radiograph 11/16/2016 FINDINGS: Normal heart size, mediastinal contours, and pulmonary vascularity. Small calcifications in the chest bilaterally again identified likely calcified pleural plaques. New LEFT perihilar mass 6.6 x 6.4 cm highly suspicious for pulmonary neoplasm. No acute infiltrate, pleural effusion or pneumothorax. No definite acute bone lesions. IMPRESSION: New LEFT perihilar mass 6.6 x 6.4 cm highly suspicious for a pulmonary neoplasm; CT chest with contrast recommended for further evaluation. Calcifications in the chest bilaterally most consistent with calcified pleural plaques, unchanged, question prior asbestos exposure. Aortic Atherosclerosis (ICD10-I70.0). Electronically Signed   By: Lavonia Dana M.D.   On: 11/09/2021 17:09   CT Angio Chest PE W/Cm &/Or Wo Cm  Result Date: 11/09/2021 CLINICAL DATA:  Pulmonary embolism (PE) suspected, high prob Two month history of nausea and decreased appetite. Symptoms after patient's wife done 2 months ago. EXAM: CT ANGIOGRAPHY CHEST WITH CONTRAST TECHNIQUE: Multidetector CT imaging of the chest was performed using the standard protocol during bolus  administration of intravenous contrast. Multiplanar CT image reconstructions and MIPs were obtained to evaluate the vascular anatomy. RADIATION DOSE REDUCTION: This exam was performed according to the departmental dose-optimization program which includes automated exposure control, adjustment of the mA and/or kV according to patient size and/or use of iterative reconstruction technique. CONTRAST:  71mL OMNIPAQUE IOHEXOL 350 MG/ML SOLN COMPARISON:  Chest radiograph earlier today. FINDINGS: Cardiovascular: There are no filling defects within the pulmonary arteries to suggest pulmonary embolus. The lingular pulmonary arteries are attenuated due to left perihilar mass. Atherosclerosis of the thoracic aorta. No aortic aneurysm or acute aortic findings. The heart is normal in size. There is no pericardial effusion. Mediastinum/Nodes: Left upper lobe perihilar mass is contiguous with the hilum. There is likely separate suprahilar adenopathy within 18 mm lymph node series 5, image 46. Likely infrahilar lymph node measuring 2.4 cm series 5, image 58. 9 mm right hilar node. Occasional prominent left lower paratracheal nodes, including a 10 mm node series 5, image 38. No definite supraclavicular adenopathy. No visualized thyroid nodule. Upper esophagus is patulous. No esophageal wall thickening. Lungs/Pleura: Left perihilar upper lobe masslike opacity. This measures 6.6 x 5.0 x 6.1 cm, measured on series 7, image 46 and series 8, image 55. Margins are irregular and spiculated with linear extension to the periphery of the pleural surface of the left upper lobe. There are small adjacent satellite nodules. Separate left upper lobe 3 mm nodule series 7, image 32. Irregular opacity at the left lung apex which appears triangular measuring 11 mm, series 7, image 21. Biapical pleuroparenchymal scarring, some of which is calcified. Bilateral calcified pleural plaques. Multiple calcified granuloma in the right lower lobe. Severe  narrowing of the left upper lobe bronchus due to in left perihilar mass. No pleural effusion. Upper Abdomen: Innumerable low-density lesions within the liver, largest lesions appear to be simple fluid density. Previous abdominal MRI 06/22/2019 demonstrated multiple hepatic cysts. No discrete adrenal nodule. Musculoskeletal: Slight flattening of mid thoracic vertebra. No destructive lytic or focal blastic osseous lesion. Review of the MIP images confirms the above findings. IMPRESSION: 1. No pulmonary embolus. 2. Left perihilar upper lobe masslike opacity measuring 6.6 x 5.0 x 6.1  cm, contiguous with the hilum. Margins are irregular and spiculated with linear extension to the periphery of the left upper lobe. Findings are highly suspicious for primary bronchogenic malignancy. 3. Perihilar mass is contiguous with the hilum, however there appears to be a discrete left hilar adenopathy. Prominent left lower paratracheal nodes, and a nonspecific 9 mm right hilar node. 4. Separate 3 mm left upper lobe nodule. Irregular opacity at the left lung apex appears triangular measuring 11 mm, nonspecific but may be scarring. 5. Severe narrowing of the left upper lobe bronchus due to left perihilar mass. 6. Innumerable low-density lesions within the liver, largest lesions appear to be simple fluid density. These were previously characterized as hepatic cysts on MRI, although not well assessed on the current exam due to phase of contrast. 7. 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 Aortic Atherosclerosis (ICD10-I70.0). Electronically Signed   By: Keith Rake M.D.   On: 11/09/2021 18:02   MR BRAIN W WO CONTRAST  Result Date: 11/10/2021 CLINICAL DATA:  Metastatic disease evaluation, lung mass EXAM: MRI HEAD WITHOUT AND WITH CONTRAST TECHNIQUE: Multiplanar, multiecho pulse sequences of the brain and surrounding structures were obtained without and with intravenous contrast. CONTRAST:  92mL GADAVIST  GADOBUTROL 1 MMOL/ML IV SOLN COMPARISON:  None. FINDINGS: Brain: There is no acute infarction or intracranial hemorrhage. There is no intracranial mass, mass effect, or edema. There is no hydrocephalus or extra-axial fluid collection. Prominence of the ventricles and sulci reflects parenchymal volume loss. Patchy T2 hyperintensity in the supratentorial white matter is nonspecific but may reflect minor chronic microvascular ischemic changes. No abnormal enhancement. Vascular: Major vessel flow voids at the skull base are preserved. Skull and upper cervical spine: Normal marrow signal is preserved. Sinuses/Orbits: Paranasal sinus mucosal thickening. Orbits are unremarkable. Other: Sella is unremarkable.  Mastoid air cells are clear. IMPRESSION: No evidence of intracranial metastatic disease. Electronically Signed   By: Macy Mis M.D.   On: 11/10/2021 14:37   CT ABDOMEN PELVIS W CONTRAST  Result Date: 11/11/2021 CLINICAL DATA:  Non-small cell lung cancer staging. EXAM: CT ABDOMEN AND PELVIS WITH CONTRAST TECHNIQUE: Multidetector CT imaging of the abdomen and pelvis was performed using the standard protocol following bolus administration of intravenous contrast. RADIATION DOSE REDUCTION: This exam was performed according to the departmental dose-optimization program which includes automated exposure control, adjustment of the mA and/or kV according to patient size and/or use of iterative reconstruction technique. CONTRAST:  55mL OMNIPAQUE IOHEXOL 300 MG/ML  SOLN COMPARISON:  Chest CTA 11/09/2021, abdominal MRI 06/22/2019 and abdominal ultrasound 01/02/2018. FINDINGS: Lower chest: Calcified pleural plaque, pleural thickening and linear scarring at both lung bases. The known left hilar mass is not imaged on this abdominal CT. No significant pleural or pericardial effusion. Aortic and coronary artery atherosclerosis are noted. Hepatobiliary: Numerous hepatic cysts are again noted, similar to previous MRI. No  suspicious liver lesions are identified. There is no significant biliary dilatation status post cholecystectomy. Pancreas: Unremarkable. No pancreatic ductal dilatation or surrounding inflammatory changes. Spleen: Normal in size without focal abnormality. Adrenals/Urinary Tract: Both adrenal glands appear normal. Small bilateral renal cysts. No evidence of renal mass, urinary tract calculus or hydronephrosis. The bladder appears unremarkable. Stomach/Bowel: Enteric contrast was administered and has passed into the rectum. The stomach appears unremarkable for its degree of distension. No evidence of bowel wall thickening, distention or surrounding inflammatory change. The appendix appears normal. Moderate stool throughout the colon. Vascular/Lymphatic: There are no enlarged abdominal or pelvic lymph nodes. Diffuse aortic  and branch vessel atherosclerosis. No evidence of aneurysm or large vessel occlusion. Reproductive: Multiple brachytherapy seeds are present within the prostate gland which otherwise appears unremarkable. Other: No ascites or peritoneal nodularity. Musculoskeletal: There is a lytic destructive mass within the right iliac bone with an associated soft tissue mass, highly suspicious for metastatic disease. This measures 3.7 x 3.2 cm on image 59/2. No other suspicious osseous findings are seen. There are degenerative changes throughout the lumbar spine which contribute to mild spinal stenosis and foraminal narrowing at the lower 4 disc space levels. IMPRESSION: 1. 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 As this patient also has a remote history of prostate cancer, correlation with serum PSA levels recommended, although there are no blastic lesions. 2. No other evidence of metastatic disease in the abdomen or pelvis. 3. Innumerable hepatic cysts, similar to previous MRI. Small renal cysts. 4. Multilevel lumbar spondylosis. 5. Aortic  Atherosclerosis (ICD10-I70.0) and Emphysema (ICD10-J43.9). Electronically Signed   By: Richardean Sale M.D.   On: 11/11/2021 09:10   DG Chest Port 1 View  Result Date: 11/12/2021 CLINICAL DATA:  Status post left bronchoscopy EXAM: PORTABLE CHEST 1 VIEW COMPARISON:  Previous studies including the examination of 11/09/2021 FINDINGS: Cardiac size is within normal limits. There is 7.5 cm homogeneous opacity in the left hilum and parahilar region with no significant interval change. There is no pneumothorax. Increased interstitial markings are seen in both lower lung fields which may be partly due to poor inspiration. There is minimal blunting of left lateral CP angle. There are scattered pleural calcifications. IMPRESSION: There is 7.5 cm opacity in the left parahilar region suggesting neoplastic process. Subtle increased markings in the lower lung fields may be due to poor inspiration. There is blunting of left lateral CP angle suggesting minimal effusion. Electronically Signed   By: Elmer Picker M.D.   On: 11/12/2021 15:37   DG C-Arm 1-60 Min-No Report  Result Date: 11/12/2021 Fluoroscopy was utilized by the requesting physician.  No radiographic interpretation.   ECHOCARDIOGRAM COMPLETE  Result Date: 11/10/2021    ECHOCARDIOGRAM REPORT   Patient Name:   Jared Mullen Date of Exam: 11/10/2021 Medical Rec #:  [id number]   Height:       69.0 in Accession #:    [id number]  Weight:       135.0 lb Date of Birth:  1941/02/17    BSA:          1.748 m Patient Age:    66 years    BP:           129/84 mmHg Patient Gender: M           HR:           78 bpm. Exam Location:  [facility] Procedure: 2D Echo, Color Doppler, Cardiac Doppler and Strain Analysis Indications:     R07.9 Chest Pain  History:         Patient has no prior history of Echocardiogram examinations.                  Risk Factors:Sleep Apnea.  Sonographer:     Charmayne Sheer Referring Phys:  [employer] Diagnosing Phys: Ida Rogue MD  Sonographer  Comments: Suboptimal parasternal window. Global longitudinal strain was attempted. IMPRESSIONS  1. Left ventricular ejection fraction, by estimation, is 55 to 60%. The left ventricle has normal function. The left ventricle has no regional wall motion abnormalities.  Left ventricular diastolic parameters are consistent with Grade I diastolic dysfunction (impaired relaxation). The average left ventricular global longitudinal strain is -18.0 %. The global longitudinal strain is normal.  2. Right ventricular systolic function is normal. The right ventricular size is normal.  3. The mitral valve is normal in structure. No evidence of mitral valve regurgitation. No evidence of mitral stenosis.  4. The aortic valve was not well visualized. Aortic valve regurgitation is not visualized. No aortic stenosis is present.  5. The inferior vena cava is normal in size with greater than 50% respiratory variability, suggesting right atrial pressure of 3 mmHg. FINDINGS  Left Ventricle: Left ventricular ejection fraction, by estimation, is 55 to 60%. The left ventricle has normal function. The left ventricle has no regional wall motion abnormalities. The average left ventricular global longitudinal strain is -18.0 %. The global longitudinal strain is normal. The left ventricular internal cavity size was normal in size. There is no left ventricular hypertrophy. Left ventricular diastolic parameters are consistent with Grade I diastolic dysfunction (impaired relaxation). Right Ventricle: The right ventricular size is normal. No increase in right ventricular wall thickness. Right ventricular systolic function is normal. Left Atrium: Left atrial size was normal in size. Right Atrium: Right atrial size was normal in size. Pericardium: There is no evidence of pericardial effusion. Mitral Valve: The mitral valve is normal in structure. No evidence of mitral valve regurgitation. No evidence of mitral valve stenosis. MV peak gradient, 2.4 mmHg.  The mean mitral valve gradient is 1.0 mmHg. Tricuspid Valve: The tricuspid valve is normal in structure. Tricuspid valve regurgitation is not demonstrated. No evidence of tricuspid stenosis. Aortic Valve: The aortic valve was not well visualized. Aortic valve regurgitation is not visualized. No aortic stenosis is present. Aortic valve mean gradient measures 3.0 mmHg. Aortic valve peak gradient measures 5.4 mmHg. Aortic valve area, by VTI measures 2.93 cm. Pulmonic Valve: The pulmonic valve was normal in structure. Pulmonic valve regurgitation is not visualized. No evidence of pulmonic stenosis. Aorta: The aortic root is normal in size and structure. Venous: The inferior vena cava is normal in size with greater than 50% respiratory variability, suggesting right atrial pressure of 3 mmHg. IAS/Shunts: No atrial level shunt detected by color flow Doppler.  LEFT VENTRICLE PLAX 2D LVIDd:         4.03 cm   Diastology LVIDs:         2.25 cm   LV e' medial:    6.09 cm/s LV PW:         0.96 cm   LV E/e' medial:  11.1 LV IVS:        0.70 cm   LV e' lateral:   11.00 cm/s LVOT diam:     2.10 cm   LV E/e' lateral: 6.2 LV SV:         56 LV SV Index:   32        2D Longitudinal Strain LVOT Area:     3.46 cm  2D Strain GLS Avg:     -18.0 %  RIGHT VENTRICLE RV Basal diam:  3.68 cm LEFT ATRIUM             Index        RIGHT ATRIUM           Index LA diam:        2.70 cm 1.54 cm/m   RA Area:     13.10 cm LA Vol (A2C):   37.0 ml 21.16  ml/m  RA Volume:   31.20 ml  17.85 ml/m LA Vol (A4C):   25.1 ml 14.36 ml/m LA Biplane Vol: 31.8 ml 18.19 ml/m  AORTIC VALVE                    PULMONIC VALVE AV Area (Vmax):    2.77 cm     PV Vmax:       0.61 m/s AV Area (Vmean):   2.55 cm     PV Vmean:      41.500 cm/s AV Area (VTI):     2.93 cm     PV VTI:        0.105 m AV Vmax:           116.00 cm/s  PV Peak grad:  1.5 mmHg AV Vmean:          81.000 cm/s  PV Mean grad:  1.0 mmHg AV VTI:            0.190 m AV Peak Grad:      5.4 mmHg AV Mean  Grad:      3.0 mmHg LVOT Vmax:         92.70 cm/s LVOT Vmean:        59.600 cm/s LVOT VTI:          0.161 m LVOT/AV VTI ratio: 0.85  AORTA Ao Root diam: 3.60 cm MITRAL VALVE MV Area (PHT): 3.97 cm    SHUNTS MV Area VTI:   2.77 cm    Systemic VTI:  0.16 m MV Peak grad:  2.4 mmHg    Systemic Diam: 2.10 cm MV Mean grad:  1.0 mmHg MV Vmax:       0.78 m/s MV Vmean:      55.6 cm/s MV Decel Time: 191 msec MV E velocity: 67.70 cm/s MV A velocity: 81.80 cm/s MV E/A ratio:  0.83 Ida Rogue MD Electronically signed by Ida Rogue MD Signature Date/Time: 11/10/2021/2:12:02 PM    Final       ASSESSMENT/PLAN   6cm left lung mass - patient and daughter Berenda Morale was present and we discussed everything together including imaging and biopsy plan.  - patient is agreeable to biopsy.    Thank you for allowing me to participate in the care of this patient.   Patient/Family are satisfied with care plan and all questions have been answered.  This document was prepared using Dragon voice recognition software and may include unintentional dictation errors.     Ottie Glazier, M.D.  Division of [place]

## 2021-11-13 NOTE — Evaluation (Signed)
Occupational Therapy Evaluation Patient Details Name: Jared Mullen MRN: [id number] DOB: [birth year] Today's Date: 11/13/2021   History of Present Illness Pt is an 81 year old man with past medical history of prostate cancer status post radiation and seed implantation.  He presented with nausea and some weight loss and found to have an elevated troponin. MD assessment includes: Elevated troponins: likely secondary to demand ischemia, Hyponatremia, Severe protein calorie malnutrition, and L lung mass with lytic lesion in R iliac bone with new diagnosis of lung CA.   Clinical Impression   Pt seen for OT evaluation this date in setting of acute hospitalization d/t nausea, now s/p bronchoscopy. He presents with decreased fxl activity tolerance, but is overall appearing to be close to his functional baseline. Pt reports living alone and being INDEP at baseline. He has some signs of deconditioning this date, but it does not appear to be meaningfully impacting functional tasks at this time. Pt's daughter present in room at time of OT evaluation and reports pt appears to be at his baseline. He requires SUPV with RW on initial STS trial for cues for safe use of RW, but he is able to complete all subsequent transfers with MOD INDEP. He demos G balance with static standing and fxl mobility with RW for UE support. He is able to alternate hands from walker for standing self care tasks. Generally performing well and not requiring extensive external assist. His daughter who is present assures that she will be staying with pt after d/c and able to help is there is a need when it comes to heavier lifting tasks/IADLs. No further acute OT needs perceived at this time, nor need for f/u. It's noted that this performance is significantly better than for PT earlier this date, OT updates MD via secure chat.      Recommendations for follow up therapy are one component of a multi-disciplinary discharge planning process, led by the  attending physician.  Recommendations may be updated based on patient status, additional functional criteria and insurance authorization.   Follow Up Recommendations  No OT follow up    Assistance Recommended at Discharge Set up Supervision/Assistance  Patient can return home with the following Assistance with cooking/housework;Assist for transportation;Help with stairs or ramp for entrance    Functional Status Assessment  Patient has not had a recent decline in their functional status  Equipment Recommendations  Tub/shower seat    Recommendations for Other Services       Precautions / Restrictions Precautions Precautions: Fall Restrictions Weight Bearing Restrictions: No      Mobility Bed Mobility Overal bed mobility: Modified Independent                  Transfers Overall transfer level: Modified independent Equipment used: Rolling walker (2 wheels) Transfers: Sit to/from Stand Sit to Stand: Supervision, Modified independent (Device/Increase time)           General transfer comment: SUPV first trial with verbal cues for safe use of RW and pt progresses to MOD I for subsequent trials      Balance Overall balance assessment: Needs assistance   Sitting balance-Leahy Scale: Good     Standing balance support: Bilateral upper extremity supported, During functional activity Standing balance-Leahy Scale: Good                             ADL either performed or assessed with clinical judgement   ADL Overall ADL's : At  baseline;Modified independent                                             Vision Patient Visual Report: No change from baseline       Perception     Praxis      Pertinent Vitals/Pain Pain Assessment Pain Assessment: No/denies pain     Hand Dominance     Extremity/Trunk Assessment Upper Extremity Assessment Upper Extremity Assessment: Overall WFL for tasks assessed   Lower Extremity  Assessment Lower Extremity Assessment: Generalized weakness       Communication Communication Communication: No difficulties   Cognition Arousal/Alertness: Awake/alert Behavior During Therapy: WFL for tasks assessed/performed Overall Cognitive Status: Within Functional Limits for tasks assessed                                 General Comments: excitable, but appropriate     General Comments       Exercises Other Exercises Other Exercises: OT engages pt in seated and standing ADLs as well as ADL transfers including commode transfer (low surface) and pt is able to complete all with MOD I. Ed with pt and pt's daughter who will be assisting him re: safety.   Shoulder Instructions      Home Living Family/patient expects to be discharged to:: Private residence Living Arrangements: Alone Available Help at Discharge: Family;Available 24 hours/day (dtr) Type of Home: House Home Access: Stairs to enter [employer] of Steps: 2 Entrance Stairs-Rails: Right Home Layout: Two level;Able to live on main level with bedroom/bathroom         Bathroom Toilet: Standard     Home Equipment: Grab bars - toilet;Cane - single point;Rolling Walker (2 wheels)          Prior Functioning/Environment Prior Level of Function : Independent/Modified Independent             Mobility Comments: Ind amb without AD with occasional SPC PRN, no fall history ADLs Comments: Ind with ADLs        OT Problem List: Decreased activity tolerance      OT Treatment/Interventions: Self-care/ADL training    OT Goals(Current goals can be found in the care plan section) Acute Rehab OT Goals Patient Stated Goal: to go home OT Goal Formulation: All assessment and education complete, DC therapy  OT Frequency:      Co-evaluation              AM-PAC OT "6 Clicks" Daily Activity     Outcome Measure Help from another person eating meals?: None Help from another person taking  care of personal grooming?: None Help from another person toileting, which includes using toliet, bedpan, or urinal?: None Help from another person bathing (including washing, rinsing, drying)?: A Little (SUPV ideal for wet surface) Help from another person to put on and taking off regular upper body clothing?: None Help from another person to put on and taking off regular lower body clothing?: None 6 Click Score: 23   End of Session Equipment Utilized During Treatment: Gait belt;Rolling walker (2 wheels) Nurse Communication: Mobility status;Other (comment) (messaged MD about mobility status)  Activity Tolerance: Patient tolerated treatment well Patient left: in bed;with call bell/phone within reach;with family/visitor present  OT Visit Diagnosis: Muscle weakness (generalized) (M62.81)  Time: 5208-0223 OT Time Calculation (min): 31 min Charges:  OT General Charges $OT Visit: 1 Visit OT Evaluation $OT Eval Low Complexity: 1 Low OT Treatments $Self Care/Home Management : 8-22 mins  Gerrianne Scale, MS, OTR/L ascom [phone] 11/13/21, 6:06 PM

## 2021-11-13 NOTE — Plan of Care (Signed)
  Problem: Health Behavior/Discharge Planning: Goal: Ability to manage health-related needs will improve Outcome: Progressing   Problem: Clinical Measurements: Goal: Ability to maintain clinical measurements within normal limits will improve Outcome: Progressing   

## 2021-11-13 NOTE — Progress Notes (Signed)
PROGRESS NOTE    Jared Mullen  JTT:[id number] DOB: [birth year] DOA: 11/09/2021 PCP: Birdie Sons, MD   Assessment & Plan:   Principal Problem:   Abdominal pain Active Problems:   Lung mass   Demand ischemia (HCC)   Elevated troponin   Nausea   Prostate cancer (HCC)   Protein-calorie malnutrition, severe   Palliative care encounter  Generalized weakness: PT recs SNF. OT consulted   Fever: etiology unclear. Afebrile for at least 24 hrs. Blood cx NGTD. Urine cx grows insignificant growth   Elevated troponins: likely secondary to demand ischemia. No chest pain. Echo shows normal EF. Continue on statin, metoprolol   Large lung mass: w/ lymph nodes. MRI brain was negative for metastases. S/p lung mass biopsy. Path pending   Hx of prostate cancer: management as per onco   Cyst on liver: on previous MRI. Continue outpatient surveillance   Hyponatremia: almost WNL.   Severe protein calorie malnutrition: continue on nutritional supplements   DVT prophylaxis: SCDs Code Status: full  Family Communication: discussed pt's daughter, Linus Orn, at bedside and answered her questions Disposition Plan: likely d/c to SNF    Level of care: Med-Surg  Status is: Inpatient  Remains inpatient appropriate because: PT recs SNF. Pt will need SNF placement     Consultants:  Onco Pulmon   Procedures:   Antimicrobials:  Subjective: Pt c/o generalized weakness   Objective: Vitals:   11/12/21 1515 11/12/21 1608 11/12/21 2005 11/13/21 0524  BP: 129/80 135/78 117/81 135/82  Pulse: 87 90 96 66  Resp: (!) 22 18 18 18   Temp: 98.6 F (37 C) 97.7 F (36.5 C) (!) 97.5 F (36.4 C) (!) 97.5 F (36.4 C)  TempSrc:  Oral Oral Oral  SpO2: 96% 95% 98% 96%  Weight:      Height:        Intake/Output Summary (Last 24 hours) at 11/13/2021 0752 Last data filed at 11/12/2021 2130 Gross per 24 hour  Intake 370 ml  Output 300 ml  Net 70 ml   Filed Weights   11/09/21 1139 11/09/21 2110   Weight: 61.2 kg 61.2 kg    Examination:  General exam: Appears calm & comfortable  Respiratory system: diminished breath sounds b/l  Cardiovascular system: S1 & S2+. No rubs or clicks  Gastrointestinal system: Abd is soft, NT, ND & normal bowel sounds  Central nervous system: alert and oriented. Moves all extremities  Psychiatry: Judgement and insight appears normal. Flat mood and affect     Data Reviewed: I have personally reviewed following labs and imaging studies  CBC: Recent Labs  Lab 11/09/21 1536 11/10/21 0143 11/11/21 0505 11/12/21 0451 11/13/21 0455  WBC 7.7 6.9 8.1 8.0 8.0  NEUTROABS 5.6  --   --   --   --   HGB 14.1 13.2 12.5* 12.8* 13.5  HCT 42.0 38.9* 37.5* 38.5* 40.4  MCV 88.6 88.0 88.7 88.1 87.1  PLT 215 199 184 205 009   Basic Metabolic Panel: Recent Labs  Lab 11/09/21 1536 11/11/21 0505 11/12/21 0451 11/13/21 0455  NA 132* 135 134* 134*  K 4.5 3.9 4.1 4.6  CL 96* 100 99 101  CO2 29 27 28 27   GLUCOSE 122* 112* 116* 177*  BUN 16 16 14 21   CREATININE 0.72 0.72 0.75 0.75  CALCIUM 9.4 8.7* 9.0 9.3   GFR: Estimated Creatinine Clearance: 61.6 mL/min (by C-G formula based on SCr of 0.75 mg/dL). Liver Function Tests: Recent Labs  Lab 11/09/21 1536  AST 17  ALT 13  ALKPHOS 71  BILITOT 1.2  PROT 7.4  ALBUMIN 3.5   Recent Labs  Lab 11/09/21 1536  LIPASE 26   No results for input(s): AMMONIA in the last 168 hours. Coagulation Profile: No results for input(s): INR, PROTIME in the last 168 hours. Cardiac Enzymes: No results for input(s): CKTOTAL, CKMB, CKMBINDEX, TROPONINI in the last 168 hours. BNP (last 3 results) No results for input(s): PROBNP in the last 8760 hours. HbA1C: No results for input(s): HGBA1C in the last 72 hours. CBG: No results for input(s): GLUCAP in the last 168 hours. Lipid Profile: No results for input(s): CHOL, HDL, LDLCALC, TRIG, CHOLHDL, LDLDIRECT in the last 72 hours.  Thyroid Function Tests: No results for  input(s): TSH, T4TOTAL, FREET4, T3FREE, THYROIDAB in the last 72 hours.  Anemia Panel: No results for input(s): VITAMINB12, FOLATE, FERRITIN, TIBC, IRON, RETICCTPCT in the last 72 hours. Sepsis Labs: Recent Labs  Lab 11/09/21 2028 11/10/21 1826 11/11/21 0505 11/12/21 0451  PROCALCITON 0.10 <0.10 0.10 <0.10    Recent Results (from the past 240 hour(s))  Resp Panel by RT-PCR (Flu A&B, Covid) Nasopharyngeal Swab     Status: None   Collection Time: 11/09/21  5:34 PM   Specimen: Nasopharyngeal Swab; Nasopharyngeal(NP) swabs in vial transport medium  Result Value Ref Range Status   SARS Coronavirus 2 by RT PCR NEGATIVE NEGATIVE Final    Comment: (NOTE) SARS-CoV-2 target nucleic acids are NOT DETECTED.  The SARS-CoV-2 RNA is generally detectable in upper respiratory specimens during the acute phase of infection. The lowest concentration of SARS-CoV-2 viral copies this assay can detect is 138 copies/mL. A negative result does not preclude SARS-Cov-2 infection and should not be used as the sole basis for treatment or other patient management decisions. A negative result may occur with  improper specimen collection/handling, submission of specimen other than nasopharyngeal swab, presence of viral mutation(s) within the areas targeted by this assay, and inadequate number of viral copies(<138 copies/mL). A negative result must be combined with clinical observations, patient history, and epidemiological information. The expected result is Negative.  Fact Sheet for Patients:  EntrepreneurPulse.com.au  Fact Sheet for Healthcare Providers:  IncredibleEmployment.be  This test is no t yet approved or cleared by the [place] FDA and  has been authorized for detection and/or diagnosis of SARS-CoV-2 by FDA under an Emergency Use Authorization (EUA). This EUA will remain  in effect (meaning this test can be used) for the duration of the COVID-19  declaration under Section 564(b)(1) of the Act, 21 U.S.C.section 360bbb-3(b)(1), unless the authorization is terminated  or revoked sooner.       Influenza A by PCR NEGATIVE NEGATIVE Final   Influenza B by PCR NEGATIVE NEGATIVE Final    Comment: (NOTE) The Xpert Xpress SARS-CoV-2/FLU/RSV plus assay is intended as an aid in the diagnosis of influenza from Nasopharyngeal swab specimens and should not be used as a sole basis for treatment. Nasal washings and aspirates are unacceptable for Xpert Xpress SARS-CoV-2/FLU/RSV testing.  Fact Sheet for Patients: EntrepreneurPulse.com.au  Fact Sheet for Healthcare Providers: IncredibleEmployment.be  This test is not yet approved or cleared by the [place] FDA and has been authorized for detection and/or diagnosis of SARS-CoV-2 by FDA under an Emergency Use Authorization (EUA). This EUA will remain in effect (meaning this test can be used) for the duration of the COVID-19 declaration under Section 564(b)(1) of the Act, 21 U.S.C. section 360bbb-3(b)(1), unless the authorization is terminated or revoked.  Performed at [facility], [address] [place], [place] [address]   CULTURE, BLOOD (ROUTINE X 2) w Reflex to ID Panel     Status: None (Preliminary result)   Collection Time: 11/10/21  6:26 PM   Specimen: BLOOD  Result Value Ref Range Status   Specimen Description BLOOD BLOOD RIGHT WRIST  Final   Special Requests BOTTLES DRAWN AEROBIC AND ANAEROBIC BCAV  Final   Culture   Final    NO GROWTH 3 DAYS Performed at [facility], [address] [place], [place] [address]    Report Status PENDING  Incomplete  CULTURE, BLOOD (ROUTINE X 2) w Reflex to ID Panel     Status: None (Preliminary result)   Collection Time: 11/10/21  7:19 PM   Specimen: BLOOD  Result Value Ref Range Status   Specimen Description BLOOD BLOOD RIGHT HAND  Final   Special Requests BOTTLES DRAWN AEROBIC AND  ANAEROBIC BCAV  Final   Culture   Final    NO GROWTH 3 DAYS Performed at [facility], [address] [place], [place] [address]    Report Status PENDING  Incomplete         Radiology Studies: CT ABDOMEN PELVIS W CONTRAST  Result Date: 11/11/2021 CLINICAL DATA:  Non-small cell lung cancer staging. EXAM: CT ABDOMEN AND PELVIS WITH CONTRAST TECHNIQUE: Multidetector CT imaging of the abdomen and pelvis was performed using the standard protocol following bolus administration of intravenous contrast. RADIATION DOSE REDUCTION: This exam was performed according to the departmental dose-optimization program which includes automated exposure control, adjustment of the mA and/or kV according to patient size and/or use of iterative reconstruction technique. CONTRAST:  59mL OMNIPAQUE IOHEXOL 300 MG/ML  SOLN COMPARISON:  Chest CTA 11/09/2021, abdominal MRI 06/22/2019 and abdominal ultrasound 01/02/2018. FINDINGS: Lower chest: Calcified pleural plaque, pleural thickening and linear scarring at both lung bases. The known left hilar mass is not imaged on this abdominal CT. No significant pleural or pericardial effusion. Aortic and coronary artery atherosclerosis are noted. Hepatobiliary: Numerous hepatic cysts are again noted, similar to previous MRI. No suspicious liver lesions are identified. There is no significant biliary dilatation status post cholecystectomy. Pancreas: Unremarkable. No pancreatic ductal dilatation or surrounding inflammatory changes. Spleen: Normal in size without focal abnormality. Adrenals/Urinary Tract: Both adrenal glands appear normal. Small bilateral renal cysts. No evidence of renal mass, urinary tract calculus or hydronephrosis. The bladder appears unremarkable. Stomach/Bowel: Enteric contrast was administered and has passed into the rectum. The stomach appears unremarkable for its degree of distension. No evidence of bowel wall thickening, distention or surrounding  inflammatory change. The appendix appears normal. Moderate stool throughout the colon. Vascular/Lymphatic: There are no enlarged abdominal or pelvic lymph nodes. Diffuse aortic and branch vessel atherosclerosis. No evidence of aneurysm or large vessel occlusion. Reproductive: Multiple brachytherapy seeds are present within the prostate gland which otherwise appears unremarkable. Other: No ascites or peritoneal nodularity. Musculoskeletal: There is a lytic destructive mass within the right iliac bone with an associated soft tissue mass, highly suspicious for metastatic disease. This measures 3.7 x 3.2 cm on image 59/2. No other suspicious osseous findings are seen. There are degenerative changes throughout the lumbar spine which contribute to mild spinal stenosis and foraminal narrowing at the lower 4 disc space levels. IMPRESSION: 1. 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 As this patient also has a remote history of prostate cancer, correlation with serum PSA levels recommended, although  there are no blastic lesions. 2. No other evidence of metastatic disease in the abdomen or pelvis. 3. Innumerable hepatic cysts, similar to previous MRI. Small renal cysts. 4. Multilevel lumbar spondylosis. 5. Aortic Atherosclerosis (ICD10-I70.0) and Emphysema (ICD10-J43.9). Electronically Signed   By: Richardean Sale M.D.   On: 11/11/2021 09:10   DG Chest Port 1 View  Result Date: 11/12/2021 CLINICAL DATA:  Status post left bronchoscopy EXAM: PORTABLE CHEST 1 VIEW COMPARISON:  Previous studies including the examination of 11/09/2021 FINDINGS: Cardiac size is within normal limits. There is 7.5 cm homogeneous opacity in the left hilum and parahilar region with no significant interval change. There is no pneumothorax. Increased interstitial markings are seen in both lower lung fields which may be partly due to poor inspiration. There is minimal blunting of left  lateral CP angle. There are scattered pleural calcifications. IMPRESSION: There is 7.5 cm opacity in the left parahilar region suggesting neoplastic process. Subtle increased markings in the lower lung fields may be due to poor inspiration. There is blunting of left lateral CP angle suggesting minimal effusion. Electronically Signed   By: Elmer Picker M.D.   On: 11/12/2021 15:37   DG C-Arm 1-60 Min-No Report  Result Date: 11/12/2021 Fluoroscopy was utilized by the requesting physician.  No radiographic interpretation.        Scheduled Meds:  atorvastatin  80 mg Oral Daily   feeding supplement  237 mL Oral TID BM   melatonin  5 mg Oral QHS   metoprolol tartrate  12.5 mg Oral BID   multivitamin with minerals  1 tablet Oral Daily   pantoprazole (PROTONIX) IV  40 mg Intravenous Q12H   Continuous Infusions:     LOS: 3 days    Time spent: 25 mins    Wyvonnia Dusky, MD [facility] Pager [phone]  If 7PM-7AM, please contact night-coverage 11/13/2021, 7:52 AM

## 2021-11-13 NOTE — Plan of Care (Signed)

## 2021-11-13 NOTE — Progress Notes (Signed)
PHARMACIST - PHYSICIAN COMMUNICATION  DR:   Jimmye Norman  CONCERNING: IV to Oral Route Change Policy  RECOMMENDATION: This patient is receiving pantoprazole by the intravenous route.  Based on criteria approved by the Pharmacy and Therapeutics Committee, the intravenous medication(s) is/are being converted to the equivalent oral dose form(s).   DESCRIPTION: These criteria include: The patient is eating (either orally or via tube) and/or has been taking other orally administered medications for a least 24 hours The patient has no evidence of active gastrointestinal bleeding or impaired GI absorption (gastrectomy, short bowel, patient on TNA or NPO).  If you have questions about this conversion, please contact the Pharmacy Department  []   [phone] )  Jared Mullen [x]   [phone] )  Jared Mullen []   [phone] )  Jared Mullen []   [phone] )  Jared Mullen []   [phone] )  Jared Mullen, [facility] 11/13/2021 1:09 PM

## 2021-11-13 NOTE — TOC Progression Note (Addendum)
Transition of Care [facility]) - Progression Note    Patient Details  Name: Jared Mullen MRN: [id number] Date of Birth: [birth year]  [facility] CM/SW Contact  Beverly Sessions, RN Phone Number: 11/13/2021, 4:33 PM  Clinical Narrative:      Patient and daughter have now decided for patient to return home with home health services.  BSC was delivered to room They state they do not have a preference of home health agency.  [facility]. Earliest start of care Tues.  MD and daughter aware  They confirm they have a WC at home And want to transport via private vehicle at discharge    Expected Discharge Plan: [place] Barriers to Discharge: Continued Medical Work up  Expected Discharge Plan and Services Expected Discharge Plan: [place] arrangements for the past 2 months: Single Family Home Expected Discharge Date: 11/11/21                                     Social Determinants of Health (SDOH) Interventions    Readmission Risk Interventions No flowsheet data found.

## 2021-11-13 NOTE — Care Management Important Message (Signed)
Important Message  Patient Details  Name: Jared Mullen MRN: [id number] Date of Birth: December 16, 1940   Medicare Important Message Given:  Yes     Dannette Barbara 11/13/2021, 11:04 AM

## 2021-11-14 DIAGNOSIS — R531 Weakness: Secondary | ICD-10-CM

## 2021-11-14 DIAGNOSIS — Z7189 Other specified counseling: Secondary | ICD-10-CM

## 2021-11-14 LAB — CBC
HCT: 39.4 % (ref 39.0–52.0)
Hemoglobin: 13 g/dL (ref 13.0–17.0)
MCH: 29.1 pg (ref 26.0–34.0)
MCHC: 33 g/dL (ref 30.0–36.0)
MCV: 88.3 fL (ref 80.0–100.0)
Platelets: 230 10*3/uL (ref 150–400)
RBC: 4.46 MIL/uL (ref 4.22–5.81)
RDW: 13.5 % (ref 11.5–15.5)
WBC: 10.7 10*3/uL — ABNORMAL HIGH (ref 4.0–10.5)
nRBC: 0 % (ref 0.0–0.2)

## 2021-11-14 LAB — BASIC METABOLIC PANEL
Anion gap: 6 (ref 5–15)
BUN: 24 mg/dL — ABNORMAL HIGH (ref 8–23)
CO2: 28 mmol/L (ref 22–32)
Calcium: 9.1 mg/dL (ref 8.9–10.3)
Chloride: 101 mmol/L (ref 98–111)
Creatinine, Ser: 0.62 mg/dL (ref 0.61–1.24)
GFR, Estimated: >60 mL/min (ref >60)
Glucose, Bld: 111 mg/dL — ABNORMAL HIGH (ref 70–99)
Potassium: 4 mmol/L (ref 3.5–5.1)
Sodium: 135 mmol/L (ref 135–145)

## 2021-11-14 MED ORDER — ATORVASTATIN CALCIUM 80 MG PO TABS: 80.0000 mg | ORAL_TABLET | Freq: Every day | ORAL | 0 refills | Status: DC

## 2021-11-14 MED ORDER — LACTULOSE 10 GM/15ML PO SOLN
30.0000 g | Freq: Two times a day (BID) | ORAL | Status: AC
  Administered 2021-11-14 (×2): 30 g via ORAL
  Filled 2021-11-14 (×2): qty 60

## 2021-11-14 MED ORDER — METOPROLOL TARTRATE 25 MG PO TABS: 12.5000 mg | ORAL_TABLET | Freq: Two times a day (BID) | ORAL | 0 refills | Status: DC

## 2021-11-14 MED ORDER — HYDROCODONE-ACETAMINOPHEN 5-325 MG PO TABS: 1.0000 | ORAL_TABLET | Freq: Four times a day (QID) | ORAL | 0 refills | Status: AC | PRN

## 2021-11-14 NOTE — Progress Notes (Signed)
Hematology/Oncology Consult note [facility]  [phone] [phone]  Patient Care Team: Birdie Sons, MD as PCP - General (Family Medicine) Minna Merritts, MD as PCP - Cardiology (Cardiology) Franchot Gallo, MD as Consulting Physician (Urology)   Name of the patient: Jared Mullen  [id number]  February 08, 1941   Date of visit: 11/13/21    Interval history- Patient reports feeling fatigued. He is undecided about subacute rehab versus going home. Reports feeling nauseous when he moves but nausea meds have been helping  ECOG PS- 2 Pain scale- 0   Review of systems- Review of Systems  Constitutional:  Positive for malaise/fatigue and weight loss.  Gastrointestinal:  Positive for nausea.     No Known Allergies   Past Medical History:  Diagnosis Date   Abnormal ECG    a. baseline inferolateral ST elevation dating back to at least 2012.   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   Sleep apnea    Urinary frequency    Weak urinary stream    Weight loss      Past Surgical History:  Procedure Laterality Date   biospy  09/30/2011-   TRUS/Bx Prostate - 5/12/biopsies positive for cancer, glandular size 32.5 cc   CHOLECYSTECTOMY  1992   RADIOACTIVE SEED IMPLANT  02/09/2012   Procedure: RADIOACTIVE SEED IMPLANT;  Surgeon: Franchot Gallo, MD;  Location: [facility];  Service: Urology;  Laterality: N/A;  RAD TECH OK PER ANNE AT MAIN OR    UVULOPALATOPHARYNGOPLASTY     VIDEO BRONCHOSCOPY WITH ENDOBRONCHIAL ULTRASOUND Bilateral 11/12/2021   Procedure: VIDEO BRONCHOSCOPY WITH ENDOBRONCHIAL ULTRASOUND;  Surgeon: Ottie Glazier, MD;  Location: [facility];  Service: Thoracic;  Laterality: Bilateral;    Social History   Socioeconomic History   Marital status: Widowed    Spouse name: Not  on file   Number of children: 3   Years of education: Not on file   Highest education level: High school graduate  Occupational History   Occupation: retired  Tobacco Use   Smoking status: Former    Packs/day: 2.00    Years: 28.00    Pack years: 56.00    Types: Cigarettes    Quit date: 10/18/1980    Years since quitting: 41.1   Smokeless tobacco: Never  Substance and Sexual Activity   Alcohol use: No   Drug use: No   Sexual activity: Not on file    Comment: Low libido  Other Topics Concern   Not on file  Social History Narrative   Lives locally.  Was caring for his wife until she passed in December.  Sedentary.   Social Determinants of Health   Financial Resource Strain: Not on file  Food Insecurity: Not on file  Transportation Needs: Not on file  Physical Activity: Not on file  Stress: Not on file  Social Connections: Not on file  Intimate Partner Violence: Not on file    Family History  Problem Relation Age of Onset   Heart disease Father    Breast cancer Sister    Cancer Sister        lung   Lung cancer Brother    Cancer Brother        bladder   Lung cancer Sister    Cancer Sister  breast   Kidney disease Brother      Current Facility-Administered Medications:    acetaminophen (TYLENOL) tablet 650 mg, 650 mg, Oral, Q6H PRN, 650 mg at 11/10/21 1533 **OR** acetaminophen (TYLENOL) suppository 650 mg, 650 mg, Rectal, Q6H PRN, Para Skeans, MD   atorvastatin (LIPITOR) tablet 80 mg, 80 mg, Oral, Daily, Theora Gianotti, NP, 80 mg at 11/14/21 0920   feeding supplement (ENSURE ENLIVE / ENSURE PLUS) liquid 237 mL, 237 mL, Oral, TID BM, Wieting, Richard, MD, 237 mL at 11/14/21 2458   hydrALAZINE (APRESOLINE) injection 5 mg, 5 mg, Intravenous, Q6H PRN, Para Skeans, MD   HYDROcodone-acetaminophen (NORCO/VICODIN) 5-325 MG per tablet 1 tablet, 1 tablet, Oral, Q6H PRN, Borders, Kirt Boys, NP   lactulose (CHRONULAC) 10 GM/15ML solution 30 g, 30 g, Oral,  BID, Jimmye Norman, Jamiese M, MD, 30 g at 11/14/21 1337   melatonin tablet 5 mg, 5 mg, Oral, QHS, Florina Ou V, MD, 5 mg at 11/13/21 2218   metoprolol tartrate (LOPRESSOR) tablet 12.5 mg, 12.5 mg, Oral, BID, Theora Gianotti, NP, 12.5 mg at 11/14/21 0920   morphine 2 MG/ML injection 1 mg, 1 mg, Intravenous, Q6H PRN, Florina Ou V, MD, 1 mg at 11/12/21 2109   multivitamin with minerals tablet 1 tablet, 1 tablet, Oral, Daily, Wieting, Richard, MD, 1 tablet at 11/14/21 0920   nitroGLYCERIN (NITROSTAT) SL tablet 0.4 mg, 0.4 mg, Sublingual, Q5 min PRN, Para Skeans, MD   ondansetron [facility]) injection 4 mg, 4 mg, Intravenous, Q6H PRN, Leslye Peer, Richard, MD, 4 mg at 11/11/21 1825   pantoprazole (PROTONIX) EC tablet 40 mg, 40 mg, Oral, BID, Dallie Piles, RPH, 40 mg at 11/14/21 0920   senna-docusate (Senokot-S) tablet 1 tablet, 1 tablet, Oral, QHS PRN, Borders, Kirt Boys, NP, 1 tablet at 11/12/21 2122  Physical exam:  Vitals:   11/13/21 2037 11/14/21 0503 11/14/21 0756 11/14/21 1654  BP: 135/90 111/70 123/81 113/67  Pulse: 72 71 65 80  Resp: 16 16 18 18   Temp: 97.6 F (36.4 C) (!) 97.4 F (36.3 C) 97.8 F (36.6 C) 98.7 F (37.1 C)  TempSrc: Oral Oral Oral Oral  SpO2: 96% 97% 98% 100%  Weight:      Height:       Physical Exam Constitutional:      Comments: Appears fatigued  Cardiovascular:     Rate and Rhythm: Normal rate and regular rhythm.     Heart sounds: Normal heart sounds.  Pulmonary:     Effort: Pulmonary effort is normal.     Breath sounds: Normal breath sounds.  Abdominal:     General: Bowel sounds are normal.     Palpations: Abdomen is soft.  Musculoskeletal:     Cervical back: Normal range of motion.  Skin:    General: Skin is warm and dry.  Neurological:     Mental Status: He is alert and oriented to person, place, and time.     CMP Latest Ref Rng & Units 11/14/2021  Glucose 70 - 99 mg/dL 111(H)  BUN 8 - 23 mg/dL 24(H)  Creatinine 0.61 - 1.24 mg/dL 0.62   Sodium 135 - 145 mmol/L 135  Potassium 3.5 - 5.1 mmol/L 4.0  Chloride 98 - 111 mmol/L 101  CO2 22 - 32 mmol/L 28  Calcium 8.9 - 10.3 mg/dL 9.1  Total Protein 6.5 - 8.1 g/dL -  Total Bilirubin 0.3 - 1.2 mg/dL -  Alkaline Phos 38 - 126 U/L -  AST 15 -  41 U/L -  ALT 0 - 44 U/L -   CBC Latest Ref Rng & Units 11/14/2021  WBC 4.0 - 10.5 K/uL 10.7(H)  Hemoglobin 13.0 - 17.0 g/dL 13.0  Hematocrit 39.0 - 52.0 % 39.4  Platelets 150 - 400 K/uL 230    @IMAGES @  DG Chest 2 View  Result Date: 11/09/2021 CLINICAL DATA:  Nausea, belching decreased appetite for 2 months, history prostate cancer EXAM: CHEST - 2 VIEW COMPARISON:  03/07/2017 rib radiographs, chest radiograph 11/16/2016 FINDINGS: Normal heart size, mediastinal contours, and pulmonary vascularity. Small calcifications in the chest bilaterally again identified likely calcified pleural plaques. New LEFT perihilar mass 6.6 x 6.4 cm highly suspicious for pulmonary neoplasm. No acute infiltrate, pleural effusion or pneumothorax. No definite acute bone lesions. IMPRESSION: New LEFT perihilar mass 6.6 x 6.4 cm highly suspicious for a pulmonary neoplasm; CT chest with contrast recommended for further evaluation. Calcifications in the chest bilaterally most consistent with calcified pleural plaques, unchanged, question prior asbestos exposure. Aortic Atherosclerosis (ICD10-I70.0). Electronically Signed   By: Lavonia Dana M.D.   On: 11/09/2021 17:09   CT Angio Chest PE W/Cm &/Or Wo Cm  Result Date: 11/09/2021 CLINICAL DATA:  Pulmonary embolism (PE) suspected, high prob Two month history of nausea and decreased appetite. Symptoms after patient's wife done 2 months ago. EXAM: CT ANGIOGRAPHY CHEST WITH CONTRAST TECHNIQUE: Multidetector CT imaging of the chest was performed using the standard protocol during bolus administration of intravenous contrast. Multiplanar CT image reconstructions and MIPs were obtained to evaluate the vascular anatomy. RADIATION  DOSE REDUCTION: This exam was performed according to the departmental dose-optimization program which includes automated exposure control, adjustment of the mA and/or kV according to patient size and/or use of iterative reconstruction technique. CONTRAST:  53mL OMNIPAQUE IOHEXOL 350 MG/ML SOLN COMPARISON:  Chest radiograph earlier today. FINDINGS: Cardiovascular: There are no filling defects within the pulmonary arteries to suggest pulmonary embolus. The lingular pulmonary arteries are attenuated due to left perihilar mass. Atherosclerosis of the thoracic aorta. No aortic aneurysm or acute aortic findings. The heart is normal in size. There is no pericardial effusion. Mediastinum/Nodes: Left upper lobe perihilar mass is contiguous with the hilum. There is likely separate suprahilar adenopathy within 18 mm lymph node series 5, image 46. Likely infrahilar lymph node measuring 2.4 cm series 5, image 58. 9 mm right hilar node. Occasional prominent left lower paratracheal nodes, including a 10 mm node series 5, image 38. No definite supraclavicular adenopathy. No visualized thyroid nodule. Upper esophagus is patulous. No esophageal wall thickening. Lungs/Pleura: Left perihilar upper lobe masslike opacity. This measures 6.6 x 5.0 x 6.1 cm, measured on series 7, image 46 and series 8, image 55. Margins are irregular and spiculated with linear extension to the periphery of the pleural surface of the left upper lobe. There are small adjacent satellite nodules. Separate left upper lobe 3 mm nodule series 7, image 32. Irregular opacity at the left lung apex which appears triangular measuring 11 mm, series 7, image 21. Biapical pleuroparenchymal scarring, some of which is calcified. Bilateral calcified pleural plaques. Multiple calcified granuloma in the right lower lobe. Severe narrowing of the left upper lobe bronchus due to in left perihilar mass. No pleural effusion. Upper Abdomen: Innumerable low-density lesions within the  liver, largest lesions appear to be simple fluid density. Previous abdominal MRI 06/22/2019 demonstrated multiple hepatic cysts. No discrete adrenal nodule. Musculoskeletal: Slight flattening of mid thoracic vertebra. No destructive lytic or focal blastic osseous lesion. Review of the  MIP images confirms the above findings. IMPRESSION: 1. No pulmonary embolus. 2. Left perihilar upper lobe masslike opacity measuring 6.6 x 5.0 x 6.1 cm, contiguous with the hilum. Margins are irregular and spiculated with linear extension to the periphery of the left upper lobe. Findings are highly suspicious for primary bronchogenic malignancy. 3. Perihilar mass is contiguous with the hilum, however there appears to be a discrete left hilar adenopathy. Prominent left lower paratracheal nodes, and a nonspecific 9 mm right hilar node. 4. Separate 3 mm left upper lobe nodule. Irregular opacity at the left lung apex appears triangular measuring 11 mm, nonspecific but may be scarring. 5. Severe narrowing of the left upper lobe bronchus due to left perihilar mass. 6. Innumerable low-density lesions within the liver, largest lesions appear to be simple fluid density. These were previously characterized as hepatic cysts on MRI, although not well assessed on the current exam due to phase of contrast. 7. 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 Aortic Atherosclerosis (ICD10-I70.0). Electronically Signed   By: Keith Rake M.D.   On: 11/09/2021 18:02   MR BRAIN W WO CONTRAST  Result Date: 11/10/2021 CLINICAL DATA:  Metastatic disease evaluation, lung mass EXAM: MRI HEAD WITHOUT AND WITH CONTRAST TECHNIQUE: Multiplanar, multiecho pulse sequences of the brain and surrounding structures were obtained without and with intravenous contrast. CONTRAST:  7mL GADAVIST GADOBUTROL 1 MMOL/ML IV SOLN COMPARISON:  None. FINDINGS: Brain: There is no acute infarction or intracranial hemorrhage. There is no intracranial mass,  mass effect, or edema. There is no hydrocephalus or extra-axial fluid collection. Prominence of the ventricles and sulci reflects parenchymal volume loss. Patchy T2 hyperintensity in the supratentorial white matter is nonspecific but may reflect minor chronic microvascular ischemic changes. No abnormal enhancement. Vascular: Major vessel flow voids at the skull base are preserved. Skull and upper cervical spine: Normal marrow signal is preserved. Sinuses/Orbits: Paranasal sinus mucosal thickening. Orbits are unremarkable. Other: Sella is unremarkable.  Mastoid air cells are clear. IMPRESSION: No evidence of intracranial metastatic disease. Electronically Signed   By: Macy Mis M.D.   On: 11/10/2021 14:37   CT ABDOMEN PELVIS W CONTRAST  Result Date: 11/11/2021 CLINICAL DATA:  Non-small cell lung cancer staging. EXAM: CT ABDOMEN AND PELVIS WITH CONTRAST TECHNIQUE: Multidetector CT imaging of the abdomen and pelvis was performed using the standard protocol following bolus administration of intravenous contrast. RADIATION DOSE REDUCTION: This exam was performed according to the departmental dose-optimization program which includes automated exposure control, adjustment of the mA and/or kV according to patient size and/or use of iterative reconstruction technique. CONTRAST:  52mL OMNIPAQUE IOHEXOL 300 MG/ML  SOLN COMPARISON:  Chest CTA 11/09/2021, abdominal MRI 06/22/2019 and abdominal ultrasound 01/02/2018. FINDINGS: Lower chest: Calcified pleural plaque, pleural thickening and linear scarring at both lung bases. The known left hilar mass is not imaged on this abdominal CT. No significant pleural or pericardial effusion. Aortic and coronary artery atherosclerosis are noted. Hepatobiliary: Numerous hepatic cysts are again noted, similar to previous MRI. No suspicious liver lesions are identified. There is no significant biliary dilatation status post cholecystectomy. Pancreas: Unremarkable. No pancreatic ductal  dilatation or surrounding inflammatory changes. Spleen: Normal in size without focal abnormality. Adrenals/Urinary Tract: Both adrenal glands appear normal. Small bilateral renal cysts. No evidence of renal mass, urinary tract calculus or hydronephrosis. The bladder appears unremarkable. Stomach/Bowel: Enteric contrast was administered and has passed into the rectum. The stomach appears unremarkable for its degree of distension. No evidence of bowel wall thickening, distention or  surrounding inflammatory change. The appendix appears normal. Moderate stool throughout the colon. Vascular/Lymphatic: There are no enlarged abdominal or pelvic lymph nodes. Diffuse aortic and branch vessel atherosclerosis. No evidence of aneurysm or large vessel occlusion. Reproductive: Multiple brachytherapy seeds are present within the prostate gland which otherwise appears unremarkable. Other: No ascites or peritoneal nodularity. Musculoskeletal: There is a lytic destructive mass within the right iliac bone with an associated soft tissue mass, highly suspicious for metastatic disease. This measures 3.7 x 3.2 cm on image 59/2. No other suspicious osseous findings are seen. There are degenerative changes throughout the lumbar spine which contribute to mild spinal stenosis and foraminal narrowing at the lower 4 disc space levels. IMPRESSION: 1. 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 As this patient also has a remote history of prostate cancer, correlation with serum PSA levels recommended, although there are no blastic lesions. 2. No other evidence of metastatic disease in the abdomen or pelvis. 3. Innumerable hepatic cysts, similar to previous MRI. Small renal cysts. 4. Multilevel lumbar spondylosis. 5. Aortic Atherosclerosis (ICD10-I70.0) and Emphysema (ICD10-J43.9). Electronically Signed   By: Richardean Sale M.D.   On: 11/11/2021 09:10   DG Chest Port 1  View  Result Date: 11/12/2021 CLINICAL DATA:  Status post left bronchoscopy EXAM: PORTABLE CHEST 1 VIEW COMPARISON:  Previous studies including the examination of 11/09/2021 FINDINGS: Cardiac size is within normal limits. There is 7.5 cm homogeneous opacity in the left hilum and parahilar region with no significant interval change. There is no pneumothorax. Increased interstitial markings are seen in both lower lung fields which may be partly due to poor inspiration. There is minimal blunting of left lateral CP angle. There are scattered pleural calcifications. IMPRESSION: There is 7.5 cm opacity in the left parahilar region suggesting neoplastic process. Subtle increased markings in the lower lung fields may be due to poor inspiration. There is blunting of left lateral CP angle suggesting minimal effusion. Electronically Signed   By: Elmer Picker M.D.   On: 11/12/2021 15:37   DG C-Arm 1-60 Min-No Report  Result Date: 11/12/2021 Fluoroscopy was utilized by the requesting physician.  No radiographic interpretation.   ECHOCARDIOGRAM COMPLETE  Result Date: 11/10/2021    ECHOCARDIOGRAM REPORT   Patient Name:   KHAIR CHASTEEN Date of Exam: 11/10/2021 Medical Rec #:  [id number]   Height:       69.0 in Accession #:    [id number]  Weight:       135.0 lb Date of Birth:  1941/10/07    BSA:          1.748 m Patient Age:    81 years    BP:           129/84 mmHg Patient Gender: M           HR:           78 bpm. Exam Location:  [facility] Procedure: 2D Echo, Color Doppler, Cardiac Doppler and Strain Analysis Indications:     R07.9 Chest Pain  History:         Patient has no prior history of Echocardiogram examinations.                  Risk Factors:Sleep Apnea.  Sonographer:     Charmayne Sheer Referring Phys:  [place] Diagnosing Phys: Ida Rogue MD  Sonographer Comments: Suboptimal parasternal window. Global longitudinal strain was attempted. IMPRESSIONS  1. Left ventricular  ejection fraction, by estimation, is  55 to 60%. The left ventricle has normal function. The left ventricle has no regional wall motion abnormalities. Left ventricular diastolic parameters are consistent with Grade I diastolic dysfunction (impaired relaxation). The average left ventricular global longitudinal strain is -18.0 %. The global longitudinal strain is normal.  2. Right ventricular systolic function is normal. The right ventricular size is normal.  3. The mitral valve is normal in structure. No evidence of mitral valve regurgitation. No evidence of mitral stenosis.  4. The aortic valve was not well visualized. Aortic valve regurgitation is not visualized. No aortic stenosis is present.  5. The inferior vena cava is normal in size with greater than 50% respiratory variability, suggesting right atrial pressure of 3 mmHg. FINDINGS  Left Ventricle: Left ventricular ejection fraction, by estimation, is 55 to 60%. The left ventricle has normal function. The left ventricle has no regional wall motion abnormalities. The average left ventricular global longitudinal strain is -18.0 %. The global longitudinal strain is normal. The left ventricular internal cavity size was normal in size. There is no left ventricular hypertrophy. Left ventricular diastolic parameters are consistent with Grade I diastolic dysfunction (impaired relaxation). Right Ventricle: The right ventricular size is normal. No increase in right ventricular wall thickness. Right ventricular systolic function is normal. Left Atrium: Left atrial size was normal in size. Right Atrium: Right atrial size was normal in size. Pericardium: There is no evidence of pericardial effusion. Mitral Valve: The mitral valve is normal in structure. No evidence of mitral valve regurgitation. No evidence of mitral valve stenosis. MV peak gradient, 2.4 mmHg. The mean mitral valve gradient is 1.0 mmHg. Tricuspid Valve: The tricuspid valve is normal in structure. Tricuspid valve regurgitation is not  demonstrated. No evidence of tricuspid stenosis. Aortic Valve: The aortic valve was not well visualized. Aortic valve regurgitation is not visualized. No aortic stenosis is present. Aortic valve mean gradient measures 3.0 mmHg. Aortic valve peak gradient measures 5.4 mmHg. Aortic valve area, by VTI measures 2.93 cm. Pulmonic Valve: The pulmonic valve was normal in structure. Pulmonic valve regurgitation is not visualized. No evidence of pulmonic stenosis. Aorta: The aortic root is normal in size and structure. Venous: The inferior vena cava is normal in size with greater than 50% respiratory variability, suggesting right atrial pressure of 3 mmHg. IAS/Shunts: No atrial level shunt detected by color flow Doppler.  LEFT VENTRICLE PLAX 2D LVIDd:         4.03 cm   Diastology LVIDs:         2.25 cm   LV e' medial:    6.09 cm/s LV PW:         0.96 cm   LV E/e' medial:  11.1 LV IVS:        0.70 cm   LV e' lateral:   11.00 cm/s LVOT diam:     2.10 cm   LV E/e' lateral: 6.2 LV SV:         56 LV SV Index:   32        2D Longitudinal Strain LVOT Area:     3.46 cm  2D Strain GLS Avg:     -18.0 %  RIGHT VENTRICLE RV Basal diam:  3.68 cm LEFT ATRIUM             Index        RIGHT ATRIUM           Index LA diam:  2.70 cm 1.54 cm/m   RA Area:     13.10 cm LA Vol (A2C):   37.0 ml 21.16 ml/m  RA Volume:   31.20 ml  17.85 ml/m LA Vol (A4C):   25.1 ml 14.36 ml/m LA Biplane Vol: 31.8 ml 18.19 ml/m  AORTIC VALVE                    PULMONIC VALVE AV Area (Vmax):    2.77 cm     PV Vmax:       0.61 m/s AV Area (Vmean):   2.55 cm     PV Vmean:      41.500 cm/s AV Area (VTI):     2.93 cm     PV VTI:        0.105 m AV Vmax:           116.00 cm/s  PV Peak grad:  1.5 mmHg AV Vmean:          81.000 cm/s  PV Mean grad:  1.0 mmHg AV VTI:            0.190 m AV Peak Grad:      5.4 mmHg AV Mean Grad:      3.0 mmHg LVOT Vmax:         92.70 cm/s LVOT Vmean:        59.600 cm/s LVOT VTI:          0.161 m LVOT/AV VTI ratio: 0.85  AORTA Ao  Root diam: 3.60 cm MITRAL VALVE MV Area (PHT): 3.97 cm    SHUNTS MV Area VTI:   2.77 cm    Systemic VTI:  0.16 m MV Peak grad:  2.4 mmHg    Systemic Diam: 2.10 cm MV Mean grad:  1.0 mmHg MV Vmax:       0.78 m/s MV Vmean:      55.6 cm/s MV Decel Time: 191 msec MV E velocity: 67.70 cm/s MV A velocity: 81.80 cm/s MV E/A ratio:  0.83 Ida Rogue MD Electronically signed by Ida Rogue MD Signature Date/Time: 11/10/2021/2:12:02 PM    Final      Assessment and plan- Patient is a 81 y.o. 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 I did review ct abdomen images independently and discussed that there is a right iliac lesion noted which appears lytic and concerning for lung cancer. I did discuss his case with DR. Yamagata and biopsy of the lesion was not recommended. It does not apper osteblastic and likely not prostate cancer. PSA is also normal.   Discussed that treatment for stage IV lung cancer usually involves systemic therapy- chemo or immunotherapy or combination. Will assess performance status as an outpatient and patients willingness to pursue treatment before deciding final course. He understands that without treatment his overall prognosis is <6 months and hospice would be reasonable. With treatment longevity based on tolerance to treatment can be 1- 1.5 years. Treatment will be palliative not curative.   I will see him in 10 days to discuss final path results. Will also send off NGS testing on his specimen   Visit Diagnosis 1. Nausea   2. Lung mass   3. NSTEMI (non-ST elevated myocardial infarction) ([place])   4. Prostate cancer ([place])   5. Protein-calorie malnutrition, severe   6. Status post bronchoscopy      Dr. Randa Evens, MD, MPH [place] at  [facility] [phone] 11/14/2021 6:00 PM

## 2021-11-14 NOTE — Progress Notes (Signed)
Patient was given an enema. Very small bowel movement was noted along with emesis.

## 2021-11-14 NOTE — Discharge Summary (Addendum)
Physician Discharge Summary  Jared Mullen:[id number] DOB: [birth year] DOA: 11/09/2021  PCP: Birdie Sons, MD  Admit date: 11/09/2021 Discharge date: 11/17/21  Admitted From: home  Disposition:  home w/ home health   Recommendations for Outpatient Follow-up:  Follow up with PCP in 1-2 weeks F/u w/ onco, Dr. Janese Banks, on 11/23/21 F/u w/ cardio, Dr. Rockey Situ, in 2 weeks F/u w/ pulmon, Dr. Lanney Gins, in 2 weeks   Home Health: yes  Equipment/Devices:  Discharge Condition: stable  CODE STATUS: full  Diet recommendation: Regular  Brief/Interim Summary: HPI was taken from Dr. Florina Ou: Jared Mullen is a 81 y.o. male seen in ed with complaints of abdominal pain and nausea since 2 months.Coughing chronically.family at bedside.  Pt has had weight loss of about 20 lbs since 4 months. Loss of appetite and nausea.  Abdominal  pain: Generalized / 10/10 Few months. Dull pain/ NR Eating makes pain worse. No relieving factors.  BM - constipation.  No bleeding.  No vomitting.    Early satiety, nausea, Ros is o/w negative.    Pt has past medical history of ESRD.   As per Dr. Leslye Peer: 83 year old man with past medical history of prostate cancer status post radiation and seed implantation.  He presents with nausea and some weight loss and found to have an elevated troponin.   As per Dr. Jimmye Norman 1/25-1/28/23: Pt was found to have a lung mass w/ enlarged lymph nodes. Pt is s/p lung mass biopsy on 11/12/21 as per pulmon, Dr. Lanney Gins. Pt tolerated the procedure pretty well. Pathology was pending at the time of d/c and pt will f/u w/ onco, Dr. Janese Banks, 11/23/21 to get the results of the biopsy. PT  evaluated the pt and recommended SNF. Pt and pt's daughter wanted the pt to go home w/ home health. Home health was set up by CM prior to d/c. For more information, please see previous progress/consult notes.   D/c delayed as pt c/o nausea and constipation. XR abd shows no acute findings.    Discharge  Diagnoses:  Principal Problem:   Abdominal pain Active Problems:   Lung mass   Demand ischemia (HCC)   Elevated troponin   Nausea   Prostate cancer (HCC)   Protein-calorie malnutrition, severe   Palliative care encounter   Goals of care, counseling/discussion   Constipation  Generalized weakness: PT recs SNF. Pt refused SNF but agreed to home health     Fever: etiology unclear. Afebrile for at least 48 hrs. Blood cx NGTD. Urine cx grows insignificant growth    Elevated troponins: likely secondary to demand ischemia. No chest pain. Echo shows normal EF. Continue on statin, metoprolol   Large lung mass: w/ lymph nodes. MRI brain was negative for metastases. S/p lung mass biopsy. Path pending    Hx of prostate cancer: management as per onco    Cyst on liver: on previous MRI. Continue outpatient surveillance   Hyponatremia: almost WNL.    Severe protein calorie malnutrition: continue on nutritional supplements  Discharge Instructions  Discharge Instructions     Diet general   Complete by: As directed    Discharge instructions   Complete by: As directed    F/u w/ onco, Dr. Janese Banks, on 11/23/21. F/u w/ PCP in 1-2 weeks. F/u w/ cardio, Dr. Rockey Situ, in 2 weeks. F/u w/ pulmon, Dr. Lanney Gins, in 1-2 weeks   Increase activity slowly   Complete by: As directed    Increase activity slowly   Complete by: As  directed       Allergies as of 11/17/2021   No Known Allergies      Medication List     TAKE these medications    atorvastatin 80 MG tablet Commonly known as: LIPITOR Take 1 tablet (80 mg total) by mouth daily.   benzonatate 100 MG capsule Commonly known as: TESSALON Take 2 capsules (200 mg total) by mouth every 8 (eight) hours.   HYDROcodone-acetaminophen 5-325 MG tablet Commonly known as: NORCO/VICODIN Take 1 tablet by mouth every 6 (six) hours as needed for up to 3 days for moderate pain or severe pain.   ipratropium 0.06 % nasal spray Commonly known as:  ATROVENT Place 2 sprays into both nostrils 4 (four) times daily.   metoprolol tartrate 25 MG tablet Commonly known as: LOPRESSOR Take 0.5 tablets (12.5 mg total) by mouth 2 (two) times daily.               Durable Medical Equipment  (From admission, onward)           Start     Ordered   11/13/21 1547  For home use only DME 3 n 1  Once        11/13/21 1546            Follow-up Information     Sindy Guadeloupe, MD. Go on 11/23/2021.   Specialty: Oncology Why: 2:30pm appointment Contact information: [place] [address] [phone]         Minna Merritts, MD. Go on 12/02/2021.   Specialty: Cardiology Why: 2pm appointment Contact information: [place] [address] [phone]         Ottie Glazier, MD. Go on 12/07/2021.   Specialty: Pulmonary Disease Why: 2:30pm Contact information: [place] [address] [phone]                No Known Allergies  Consultations: Pulmon Onco    Procedures/Studies: DG Chest 2 View  Result Date: 11/09/2021 CLINICAL DATA:  Nausea, belching decreased appetite for 2 months, history prostate cancer EXAM: CHEST - 2 VIEW COMPARISON:  03/07/2017 rib radiographs, chest radiograph 11/16/2016 FINDINGS: Normal heart size, mediastinal contours, and pulmonary vascularity. Small calcifications in the chest bilaterally again identified likely calcified pleural plaques. New LEFT perihilar mass 6.6 x 6.4 cm highly suspicious for pulmonary neoplasm. No acute infiltrate, pleural effusion or pneumothorax. No definite acute bone lesions. IMPRESSION: New LEFT perihilar mass 6.6 x 6.4 cm highly suspicious for a pulmonary neoplasm; CT chest with contrast recommended for further evaluation. Calcifications in the chest bilaterally most consistent with calcified pleural plaques, unchanged, question prior asbestos exposure. Aortic Atherosclerosis (ICD10-I70.0).  Electronically Signed   By: Lavonia Dana M.D.   On: 11/09/2021 17:09   DG Abd 1 View  Result Date: 11/15/2021 CLINICAL DATA:  Abdominal pain and distention. Metastatic lung carcinoma. EXAM: ABDOMEN - 1 VIEW COMPARISON:  03/08/2017 FINDINGS: Gas-filled nondilated small bowel loops are seen. Moderate stool also noted, predominantly in the right colon. Right upper quadrant surgical clips again noted from prior cholecystectomy. Brachytherapy seeds seen in the prostate bed. IMPRESSION: Nonobstructive bowel gas pattern.  No acute findings. Electronically Signed   By: Marlaine Hind M.D.   On: 11/15/2021 13:09   CT Angio Chest PE W/Cm &/Or Wo Cm  Result Date: 11/09/2021 CLINICAL DATA:  Pulmonary embolism (PE) suspected, high prob Two month history of nausea and decreased appetite. Symptoms after patient's wife done  2 months ago. EXAM: CT ANGIOGRAPHY CHEST WITH CONTRAST TECHNIQUE: Multidetector CT imaging of the chest was performed using the standard protocol during bolus administration of intravenous contrast. Multiplanar CT image reconstructions and MIPs were obtained to evaluate the vascular anatomy. RADIATION DOSE REDUCTION: This exam was performed according to the departmental dose-optimization program which includes automated exposure control, adjustment of the mA and/or kV according to patient size and/or use of iterative reconstruction technique. CONTRAST:  53mL OMNIPAQUE IOHEXOL 350 MG/ML SOLN COMPARISON:  Chest radiograph earlier today. FINDINGS: Cardiovascular: There are no filling defects within the pulmonary arteries to suggest pulmonary embolus. The lingular pulmonary arteries are attenuated due to left perihilar mass. Atherosclerosis of the thoracic aorta. No aortic aneurysm or acute aortic findings. The heart is normal in size. There is no pericardial effusion. Mediastinum/Nodes: Left upper lobe perihilar mass is contiguous with the hilum. There is likely separate suprahilar adenopathy within 18 mm  lymph node series 5, image 46. Likely infrahilar lymph node measuring 2.4 cm series 5, image 58. 9 mm right hilar node. Occasional prominent left lower paratracheal nodes, including a 10 mm node series 5, image 38. No definite supraclavicular adenopathy. No visualized thyroid nodule. Upper esophagus is patulous. No esophageal wall thickening. Lungs/Pleura: Left perihilar upper lobe masslike opacity. This measures 6.6 x 5.0 x 6.1 cm, measured on series 7, image 46 and series 8, image 55. Margins are irregular and spiculated with linear extension to the periphery of the pleural surface of the left upper lobe. There are small adjacent satellite nodules. Separate left upper lobe 3 mm nodule series 7, image 32. Irregular opacity at the left lung apex which appears triangular measuring 11 mm, series 7, image 21. Biapical pleuroparenchymal scarring, some of which is calcified. Bilateral calcified pleural plaques. Multiple calcified granuloma in the right lower lobe. Severe narrowing of the left upper lobe bronchus due to in left perihilar mass. No pleural effusion. Upper Abdomen: Innumerable low-density lesions within the liver, largest lesions appear to be simple fluid density. Previous abdominal MRI 06/22/2019 demonstrated multiple hepatic cysts. No discrete adrenal nodule. Musculoskeletal: Slight flattening of mid thoracic vertebra. No destructive lytic or focal blastic osseous lesion. Review of the MIP images confirms the above findings. IMPRESSION: 1. No pulmonary embolus. 2. Left perihilar upper lobe masslike opacity measuring 6.6 x 5.0 x 6.1 cm, contiguous with the hilum. Margins are irregular and spiculated with linear extension to the periphery of the left upper lobe. Findings are highly suspicious for primary bronchogenic malignancy. 3. Perihilar mass is contiguous with the hilum, however there appears to be a discrete left hilar adenopathy. Prominent left lower paratracheal nodes, and a nonspecific 9 mm right  hilar node. 4. Separate 3 mm left upper lobe nodule. Irregular opacity at the left lung apex appears triangular measuring 11 mm, nonspecific but may be scarring. 5. Severe narrowing of the left upper lobe bronchus due to left perihilar mass. 6. Innumerable low-density lesions within the liver, largest lesions appear to be simple fluid density. These were previously characterized as hepatic cysts on MRI, although not well assessed on the current exam due to phase of contrast. 7. 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 Aortic Atherosclerosis (ICD10-I70.0). Electronically Signed   By: Keith Rake M.D.   On: 11/09/2021 18:02   MR BRAIN W WO CONTRAST  Result Date: 11/10/2021 CLINICAL DATA:  Metastatic disease evaluation, lung mass EXAM: MRI HEAD WITHOUT AND WITH CONTRAST TECHNIQUE: Multiplanar, multiecho pulse sequences of the brain and surrounding structures were  obtained without and with intravenous contrast. CONTRAST:  65mL GADAVIST GADOBUTROL 1 MMOL/ML IV SOLN COMPARISON:  None. FINDINGS: Brain: There is no acute infarction or intracranial hemorrhage. There is no intracranial mass, mass effect, or edema. There is no hydrocephalus or extra-axial fluid collection. Prominence of the ventricles and sulci reflects parenchymal volume loss. Patchy T2 hyperintensity in the supratentorial white matter is nonspecific but may reflect minor chronic microvascular ischemic changes. No abnormal enhancement. Vascular: Major vessel flow voids at the skull base are preserved. Skull and upper cervical spine: Normal marrow signal is preserved. Sinuses/Orbits: Paranasal sinus mucosal thickening. Orbits are unremarkable. Other: Sella is unremarkable.  Mastoid air cells are clear. IMPRESSION: No evidence of intracranial metastatic disease. Electronically Signed   By: Macy Mis M.D.   On: 11/10/2021 14:37   CT ABDOMEN PELVIS W CONTRAST  Result Date: 11/11/2021 CLINICAL DATA:  Non-small cell lung  cancer staging. EXAM: CT ABDOMEN AND PELVIS WITH CONTRAST TECHNIQUE: Multidetector CT imaging of the abdomen and pelvis was performed using the standard protocol following bolus administration of intravenous contrast. RADIATION DOSE REDUCTION: This exam was performed according to the departmental dose-optimization program which includes automated exposure control, adjustment of the mA and/or kV according to patient size and/or use of iterative reconstruction technique. CONTRAST:  75mL OMNIPAQUE IOHEXOL 300 MG/ML  SOLN COMPARISON:  Chest CTA 11/09/2021, abdominal MRI 06/22/2019 and abdominal ultrasound 01/02/2018. FINDINGS: Lower chest: Calcified pleural plaque, pleural thickening and linear scarring at both lung bases. The known left hilar mass is not imaged on this abdominal CT. No significant pleural or pericardial effusion. Aortic and coronary artery atherosclerosis are noted. Hepatobiliary: Numerous hepatic cysts are again noted, similar to previous MRI. No suspicious liver lesions are identified. There is no significant biliary dilatation status post cholecystectomy. Pancreas: Unremarkable. No pancreatic ductal dilatation or surrounding inflammatory changes. Spleen: Normal in size without focal abnormality. Adrenals/Urinary Tract: Both adrenal glands appear normal. Small bilateral renal cysts. No evidence of renal mass, urinary tract calculus or hydronephrosis. The bladder appears unremarkable. Stomach/Bowel: Enteric contrast was administered and has passed into the rectum. The stomach appears unremarkable for its degree of distension. No evidence of bowel wall thickening, distention or surrounding inflammatory change. The appendix appears normal. Moderate stool throughout the colon. Vascular/Lymphatic: There are no enlarged abdominal or pelvic lymph nodes. Diffuse aortic and branch vessel atherosclerosis. No evidence of aneurysm or large vessel occlusion. Reproductive: Multiple brachytherapy seeds are present  within the prostate gland which otherwise appears unremarkable. Other: No ascites or peritoneal nodularity. Musculoskeletal: There is a lytic destructive mass within the right iliac bone with an associated soft tissue mass, highly suspicious for metastatic disease. This measures 3.7 x 3.2 cm on image 59/2. No other suspicious osseous findings are seen. There are degenerative changes throughout the lumbar spine which contribute to mild spinal stenosis and foraminal narrowing at the lower 4 disc space levels. IMPRESSION: 1. 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 As this patient also has a remote history of prostate cancer, correlation with serum PSA levels recommended, although there are no blastic lesions. 2. No other evidence of metastatic disease in the abdomen or pelvis. 3. Innumerable hepatic cysts, similar to previous MRI. Small renal cysts. 4. Multilevel lumbar spondylosis. 5. Aortic Atherosclerosis (ICD10-I70.0) and Emphysema (ICD10-J43.9). Electronically Signed   By: Richardean Sale M.D.   On: 11/11/2021 09:10   DG Chest Port 1 View  Result Date: 11/12/2021 CLINICAL DATA:  Status post  left bronchoscopy EXAM: PORTABLE CHEST 1 VIEW COMPARISON:  Previous studies including the examination of 11/09/2021 FINDINGS: Cardiac size is within normal limits. There is 7.5 cm homogeneous opacity in the left hilum and parahilar region with no significant interval change. There is no pneumothorax. Increased interstitial markings are seen in both lower lung fields which may be partly due to poor inspiration. There is minimal blunting of left lateral CP angle. There are scattered pleural calcifications. IMPRESSION: There is 7.5 cm opacity in the left parahilar region suggesting neoplastic process. Subtle increased markings in the lower lung fields may be due to poor inspiration. There is blunting of left lateral CP angle suggesting minimal effusion.  Electronically Signed   By: Elmer Picker M.D.   On: 11/12/2021 15:37   DG C-Arm 1-60 Min-No Report  Result Date: 11/12/2021 Fluoroscopy was utilized by the requesting physician.  No radiographic interpretation.   ECHOCARDIOGRAM COMPLETE  Result Date: 11/10/2021    ECHOCARDIOGRAM REPORT   Patient Name:   Jared Mullen Date of Exam: 11/10/2021 Medical Rec #:  [id number]   Height:       69.0 in Accession #:    [id number]  Weight:       135.0 lb Date of Birth:  [birth year]    BSA:          1.748 m Patient Age:    63 years    BP:           129/84 mmHg Patient Gender: M           HR:           78 bpm. Exam Location:  [facility] Procedure: 2D Echo, Color Doppler, Cardiac Doppler and Strain Analysis Indications:     R07.9 Chest Pain  History:         Patient has no prior history of Echocardiogram examinations.                  Risk Factors:Sleep Apnea.  Sonographer:     Charmayne Sheer Referring Phys:  [place] Diagnosing Phys: Ida Rogue MD  Sonographer Comments: Suboptimal parasternal window. Global longitudinal strain was attempted. IMPRESSIONS  1. Left ventricular ejection fraction, by estimation, is 55 to 60%. The left ventricle has normal function. The left ventricle has no regional wall motion abnormalities. Left ventricular diastolic parameters are consistent with Grade I diastolic dysfunction (impaired relaxation). The average left ventricular global longitudinal strain is -18.0 %. The global longitudinal strain is normal.  2. Right ventricular systolic function is normal. The right ventricular size is normal.  3. The mitral valve is normal in structure. No evidence of mitral valve regurgitation. No evidence of mitral stenosis.  4. The aortic valve was not well visualized. Aortic valve regurgitation is not visualized. No aortic stenosis is present.  5. The inferior vena cava is normal in size with greater than 50% respiratory variability, suggesting right atrial pressure of 3 mmHg. FINDINGS  Left  Ventricle: Left ventricular ejection fraction, by estimation, is 55 to 60%. The left ventricle has normal function. The left ventricle has no regional wall motion abnormalities. The average left ventricular global longitudinal strain is -18.0 %. The global longitudinal strain is normal. The left ventricular internal cavity size was normal in size. There is no left ventricular hypertrophy. Left ventricular diastolic parameters are consistent with Grade I diastolic dysfunction (impaired relaxation). Right Ventricle: The right ventricular size is normal. No increase in right ventricular wall thickness. Right ventricular systolic function is  normal. Left Atrium: Left atrial size was normal in size. Right Atrium: Right atrial size was normal in size. Pericardium: There is no evidence of pericardial effusion. Mitral Valve: The mitral valve is normal in structure. No evidence of mitral valve regurgitation. No evidence of mitral valve stenosis. MV peak gradient, 2.4 mmHg. The mean mitral valve gradient is 1.0 mmHg. Tricuspid Valve: The tricuspid valve is normal in structure. Tricuspid valve regurgitation is not demonstrated. No evidence of tricuspid stenosis. Aortic Valve: The aortic valve was not well visualized. Aortic valve regurgitation is not visualized. No aortic stenosis is present. Aortic valve mean gradient measures 3.0 mmHg. Aortic valve peak gradient measures 5.4 mmHg. Aortic valve area, by VTI measures 2.93 cm. Pulmonic Valve: The pulmonic valve was normal in structure. Pulmonic valve regurgitation is not visualized. No evidence of pulmonic stenosis. Aorta: The aortic root is normal in size and structure. Venous: The inferior vena cava is normal in size with greater than 50% respiratory variability, suggesting right atrial pressure of 3 mmHg. IAS/Shunts: No atrial level shunt detected by color flow Doppler.  LEFT VENTRICLE PLAX 2D LVIDd:         4.03 cm   Diastology LVIDs:         2.25 cm   LV e' medial:     6.09 cm/s LV PW:         0.96 cm   LV E/e' medial:  11.1 LV IVS:        0.70 cm   LV e' lateral:   11.00 cm/s LVOT diam:     2.10 cm   LV E/e' lateral: 6.2 LV SV:         56 LV SV Index:   32        2D Longitudinal Strain LVOT Area:     3.46 cm  2D Strain GLS Avg:     -18.0 %  RIGHT VENTRICLE RV Basal diam:  3.68 cm LEFT ATRIUM             Index        RIGHT ATRIUM           Index LA diam:        2.70 cm 1.54 cm/m   RA Area:     13.10 cm LA Vol (A2C):   37.0 ml 21.16 ml/m  RA Volume:   31.20 ml  17.85 ml/m LA Vol (A4C):   25.1 ml 14.36 ml/m LA Biplane Vol: 31.8 ml 18.19 ml/m  AORTIC VALVE                    PULMONIC VALVE AV Area (Vmax):    2.77 cm     PV Vmax:       0.61 m/s AV Area (Vmean):   2.55 cm     PV Vmean:      41.500 cm/s AV Area (VTI):     2.93 cm     PV VTI:        0.105 m AV Vmax:           116.00 cm/s  PV Peak grad:  1.5 mmHg AV Vmean:          81.000 cm/s  PV Mean grad:  1.0 mmHg AV VTI:            0.190 m AV Peak Grad:      5.4 mmHg AV Mean Grad:      3.0 mmHg LVOT Vmax:  92.70 cm/s LVOT Vmean:        59.600 cm/s LVOT VTI:          0.161 m LVOT/AV VTI ratio: 0.85  AORTA Ao Root diam: 3.60 cm MITRAL VALVE MV Area (PHT): 3.97 cm    SHUNTS MV Area VTI:   2.77 cm    Systemic VTI:  0.16 m MV Peak grad:  2.4 mmHg    Systemic Diam: 2.10 cm MV Mean grad:  1.0 mmHg MV Vmax:       0.78 m/s MV Vmean:      55.6 cm/s MV Decel Time: 191 msec MV E velocity: 67.70 cm/s MV A velocity: 81.80 cm/s MV E/A ratio:  0.83 Ida Rogue MD Electronically signed by Ida Rogue MD Signature Date/Time: 11/10/2021/2:12:02 PM    Final    (Echo, Carotid, EGD, Colonoscopy, ERCP)    Subjective: Pt c/o fatigue    Discharge Exam: Vitals:   11/16/21 2031 11/17/21 0511  BP: 120/87 137/87  Pulse: 76 67  Resp:    Temp: 98.2 F (36.8 C) 97.6 F (36.4 C)  SpO2: 99% 99%   Vitals:   11/16/21 0557 11/16/21 0833 11/16/21 2031 11/17/21 0511  BP: 111/74 115/74 120/87 137/87  Pulse: 72 73 76 67  Resp:  16 16    Temp: 98 F (36.7 C) 98.1 F (36.7 C) 98.2 F (36.8 C) 97.6 F (36.4 C)  TempSrc: Oral Oral Oral Oral  SpO2: 97% 98% 99% 99%  Weight:      Height:        General: Pt is alert, awake, not in acute distress. Frail appearing  Cardiovascular: S1/S2 +, no rubs, no gallops Respiratory: diminished breath sounds b/l  Abdominal: Soft, NT, ND, bowel sounds + Extremities: no edema, no cyanosis    The results of significant diagnostics from this hospitalization (including imaging, microbiology, ancillary and laboratory) are listed below for reference.     Microbiology: Recent Results (from the past 240 hour(s))  Resp Panel by RT-PCR (Flu A&B, Covid) Nasopharyngeal Swab     Status: None   Collection Time: 11/09/21  5:34 PM   Specimen: Nasopharyngeal Swab; Nasopharyngeal(NP) swabs in vial transport medium  Result Value Ref Range Status   SARS Coronavirus 2 by RT PCR NEGATIVE NEGATIVE Final    Comment: (NOTE) SARS-CoV-2 target nucleic acids are NOT DETECTED.  The SARS-CoV-2 RNA is generally detectable in upper respiratory specimens during the acute phase of infection. The lowest concentration of SARS-CoV-2 viral copies this assay can detect is 138 copies/mL. A negative result does not preclude SARS-Cov-2 infection and should not be used as the sole basis for treatment or other patient management decisions. A negative result may occur with  improper specimen collection/handling, submission of specimen other than nasopharyngeal swab, presence of viral mutation(s) within the areas targeted by this assay, and inadequate number of viral copies(<138 copies/mL). A negative result must be combined with clinical observations, patient history, and epidemiological information. The expected result is Negative.  Fact Sheet for Patients:  EntrepreneurPulse.com.au  Fact Sheet for Healthcare Providers:  IncredibleEmployment.be  This test is no t yet  approved or cleared by the [place] FDA and  has been authorized for detection and/or diagnosis of SARS-CoV-2 by FDA under an Emergency Use Authorization (EUA). This EUA will remain  in effect (meaning this test can be used) for the duration of the COVID-19 declaration under Section 564(b)(1) of the Act, 21 U.S.C.section 360bbb-3(b)(1), unless the authorization is terminated  or revoked sooner.  Influenza A by PCR NEGATIVE NEGATIVE Final   Influenza B by PCR NEGATIVE NEGATIVE Final    Comment: (NOTE) The Xpert Xpress SARS-CoV-2/FLU/RSV plus assay is intended as an aid in the diagnosis of influenza from Nasopharyngeal swab specimens and should not be used as a sole basis for treatment. Nasal washings and aspirates are unacceptable for Xpert Xpress SARS-CoV-2/FLU/RSV testing.  Fact Sheet for Patients: EntrepreneurPulse.com.au  Fact Sheet for Healthcare Providers: IncredibleEmployment.be  This test is not yet approved or cleared by the [place] FDA and has been authorized for detection and/or diagnosis of SARS-CoV-2 by FDA under an Emergency Use Authorization (EUA). This EUA will remain in effect (meaning this test can be used) for the duration of the COVID-19 declaration under Section 564(b)(1) of the Act, 21 U.S.C. section 360bbb-3(b)(1), unless the authorization is terminated or revoked.  Performed at [facility], [address] [place], [place] [address]   CULTURE, BLOOD (ROUTINE X 2) w Reflex to ID Panel     Status: None   Collection Time: 11/10/21  6:26 PM   Specimen: BLOOD  Result Value Ref Range Status   Specimen Description BLOOD BLOOD RIGHT WRIST  Final   Special Requests BOTTLES DRAWN AEROBIC AND ANAEROBIC BCAV  Final   Culture   Final    NO GROWTH 5 DAYS Performed at [facility], [address] [place], [place] [address]    Report Status 11/15/2021 FINAL  Final  CULTURE, BLOOD (ROUTINE X  2) w Reflex to ID Panel     Status: None   Collection Time: 11/10/21  7:19 PM   Specimen: BLOOD  Result Value Ref Range Status   Specimen Description BLOOD BLOOD RIGHT HAND  Final   Special Requests BOTTLES DRAWN AEROBIC AND ANAEROBIC BCAV  Final   Culture   Final    NO GROWTH 5 DAYS Performed at [facility], [address] [place], [place] [address]    Report Status 11/15/2021 FINAL  Final  Urine Culture     Status: Abnormal   Collection Time: 11/11/21  4:45 PM   Specimen: Urine, Random  Result Value Ref Range Status   Specimen Description   Final    URINE, RANDOM Performed at [facility], [address], [place], [place] [address]    Special Requests   Final    NONE Performed at [facility], [address], [place], [place] [address]    Culture (A)  Final    <10,000 COLONIES/mL INSIGNIFICANT GROWTH Performed at [facility], [place] [address] [place],  [address]    Report Status 11/13/2021 FINAL  Final     Labs: BNP (last 3 results) No results for input(s): BNP in the last 8760 hours. Basic Metabolic Panel: Recent Labs  Lab 11/12/21 0451 11/13/21 0455 11/14/21 0642 11/15/21 0447 11/16/21 0453  NA 134* 134* 135 137 136  K 4.1 4.6 4.0 4.3 4.5  CL 99 101 101 99 99  CO2 28 27 28 29 30   GLUCOSE 116* 177* 111* 111* 105*  BUN 14 21 24* 22 14  CREATININE 0.75 0.75 0.62 0.91 0.87  CALCIUM 9.0 9.3 9.1 9.5 8.9   Liver Function Tests: No results for input(s): AST, ALT, ALKPHOS, BILITOT, PROT, ALBUMIN in the last 168 hours.  No results for input(s): LIPASE, AMYLASE in the last 168 hours.  No results for input(s): AMMONIA in the last 168 hours. CBC: Recent Labs  Lab 11/12/21 0451 11/13/21 0455 11/14/21 7353 11/15/21  0447 11/16/21 0453  WBC 8.0 8.0 10.7* 9.8 8.6  HGB 12.8* 13.5 13.0 13.3 13.2  HCT 38.5* 40.4 39.4 40.0 40.2  MCV 88.1 87.1 88.3 88.1 90.1  PLT 205 228 230 231 231   Cardiac Enzymes: No results for  input(s): CKTOTAL, CKMB, CKMBINDEX, TROPONINI in the last 168 hours. BNP: Invalid input(s): POCBNP CBG: No results for input(s): GLUCAP in the last 168 hours. D-Dimer No results for input(s): DDIMER in the last 72 hours. Hgb A1c No results for input(s): HGBA1C in the last 72 hours. Lipid Profile No results for input(s): CHOL, HDL, LDLCALC, TRIG, CHOLHDL, LDLDIRECT in the last 72 hours. Thyroid function studies No results for input(s): TSH, T4TOTAL, T3FREE, THYROIDAB in the last 72 hours.  Invalid input(s): FREET3 Anemia work up No results for input(s): VITAMINB12, FOLATE, FERRITIN, TIBC, IRON, RETICCTPCT in the last 72 hours. Urinalysis    Component Value Date/Time   COLORURINE YELLOW 11/10/2021 1540   APPEARANCEUR CLEAR 11/10/2021 1540   LABSPEC 1.015 11/10/2021 1540   PHURINE 7.5 11/10/2021 1540   GLUCOSEU NEGATIVE 11/10/2021 1540   HGBUR TRACE (A) 11/10/2021 1540   BILIRUBINUR NEGATIVE 11/10/2021 1540   BILIRUBINUR negative 12/27/2017 1715   KETONESUR 40 (A) 11/10/2021 1540   PROTEINUR NEGATIVE 11/10/2021 1540   UROBILINOGEN 0.2 12/27/2017 1715   NITRITE NEGATIVE 11/10/2021 1540   LEUKOCYTESUR NEGATIVE 11/10/2021 1540   Sepsis Labs Invalid input(s): PROCALCITONIN,  WBC,  LACTICIDVEN Microbiology Recent Results (from the past 240 hour(s))  Resp Panel by RT-PCR (Flu A&B, Covid) Nasopharyngeal Swab     Status: None   Collection Time: 11/09/21  5:34 PM   Specimen: Nasopharyngeal Swab; Nasopharyngeal(NP) swabs in vial transport medium  Result Value Ref Range Status   SARS Coronavirus 2 by RT PCR NEGATIVE NEGATIVE Final    Comment: (NOTE) SARS-CoV-2 target nucleic acids are NOT DETECTED.  The SARS-CoV-2 RNA is generally detectable in upper respiratory specimens during the acute phase of infection. The lowest concentration of SARS-CoV-2 viral copies this assay can detect is 138 copies/mL. A negative result does not preclude SARS-Cov-2 infection and should not be used as  the sole basis for treatment or other patient management decisions. A negative result may occur with  improper specimen collection/handling, submission of specimen other than nasopharyngeal swab, presence of viral mutation(s) within the areas targeted by this assay, and inadequate number of viral copies(<138 copies/mL). A negative result must be combined with clinical observations, patient history, and epidemiological information. The expected result is Negative.  Fact Sheet for Patients:  EntrepreneurPulse.com.au  Fact Sheet for Healthcare Providers:  IncredibleEmployment.be  This test is no t yet approved or cleared by the [place] FDA and  has been authorized for detection and/or diagnosis of SARS-CoV-2 by FDA under an Emergency Use Authorization (EUA). This EUA will remain  in effect (meaning this test can be used) for the duration of the COVID-19 declaration under Section 564(b)(1) of the Act, 21 U.S.C.section 360bbb-3(b)(1), unless the authorization is terminated  or revoked sooner.       Influenza A by PCR NEGATIVE NEGATIVE Final   Influenza B by PCR NEGATIVE NEGATIVE Final    Comment: (NOTE) The Xpert Xpress SARS-CoV-2/FLU/RSV plus assay is intended as an aid in the diagnosis of influenza from Nasopharyngeal swab specimens and should not be used as a sole basis for treatment. Nasal washings and aspirates are unacceptable for Xpert Xpress SARS-CoV-2/FLU/RSV testing.  Fact Sheet for Patients: EntrepreneurPulse.com.au  Fact Sheet for Healthcare Providers: IncredibleEmployment.be  This  test is not yet approved or cleared by the [place] and has been authorized for detection and/or diagnosis of SARS-CoV-2 by FDA under an Emergency Use Authorization (EUA). This EUA will remain in effect (meaning this test can be used) for the duration of the COVID-19 declaration under Section 564(b)(1) of  the Act, 21 U.S.C. section 360bbb-3(b)(1), unless the authorization is terminated or revoked.  Performed at [facility], [address] [place], [place] [address]   CULTURE, BLOOD (ROUTINE X 2) w Reflex to ID Panel     Status: None   Collection Time: 11/10/21  6:26 PM   Specimen: BLOOD  Result Value Ref Range Status   Specimen Description BLOOD BLOOD RIGHT WRIST  Final   Special Requests BOTTLES DRAWN AEROBIC AND ANAEROBIC BCAV  Final   Culture   Final    NO GROWTH 5 DAYS Performed at [facility], [address] [place], [place] [address]    Report Status 11/15/2021 FINAL  Final  CULTURE, BLOOD (ROUTINE X 2) w Reflex to ID Panel     Status: None   Collection Time: 11/10/21  7:19 PM   Specimen: BLOOD  Result Value Ref Range Status   Specimen Description BLOOD BLOOD RIGHT HAND  Final   Special Requests BOTTLES DRAWN AEROBIC AND ANAEROBIC BCAV  Final   Culture   Final    NO GROWTH 5 DAYS Performed at [facility], [address] [place], [place] [address]    Report Status 11/15/2021 FINAL  Final  Urine Culture     Status: Abnormal   Collection Time: 11/11/21  4:45 PM   Specimen: Urine, Random  Result Value Ref Range Status   Specimen Description   Final    URINE, RANDOM Performed at [facility], [address], [place], [place] [address]    Special Requests   Final    NONE Performed at [facility], [address] [place], [place] [address]    Culture (A)  Final    <10,000 COLONIES/mL INSIGNIFICANT GROWTH Performed at [facility], [place] [address], [place],  [address]    Report Status 11/13/2021 FINAL  Final     Time coordinating discharge: Over 30 minutes  SIGNED:   Wyvonnia Dusky, MD  [facility] 11/17/2021, 7:56 AM Pager   If 7PM-7AM, please contact night-coverage

## 2021-11-14 NOTE — TOC Transition Note (Addendum)
Transition of Care [facility]) - CM/SW Discharge Note   Patient Details  Name: Jared Mullen MRN: [id number] Date of Birth: Apr 01, 1941  [facility]) CM/SW Contact:  Izola Price, RN Phone Number: 11/14/2021, 12:06 PM   Clinical Narrative:  1/28: Discharge to home with [facility] confirmed with [facility] via Tommi Rumps. DME ordered and delivered 1/27 per CM note. Per CM note wheelchair at home and will transport home via private vehicle. Simmie Davies RN CM    Update 1/29: Patient did not discharge after all after receiving and enema and having some nausea. Pending disposition/medical progress today. Simmie Davies RN CM   Final next level of care: [place] Barriers to Discharge: Barriers Resolved   Patient Goals and CMS Choice        Discharge Placement                       Discharge Plan and Services                DME Arranged: 3-N-1 DME Agency: [place]: [place] Date [facility] Agency Contacted: 11/14/21 Time [place]: 1205 Representative spoke with at [place]: Alvis Lemmings confirmed with Tommi Rumps.  Social Determinants of Health (SDOH) Interventions     Readmission Risk Interventions No flowsheet data found.

## 2021-11-14 NOTE — Plan of Care (Signed)

## 2021-11-15 ENCOUNTER — Inpatient Hospital Stay: Payer: Medicare Other

## 2021-11-15 LAB — BASIC METABOLIC PANEL
Anion gap: 9 (ref 5–15)
BUN: 22 mg/dL (ref 8–23)
CO2: 29 mmol/L (ref 22–32)
Calcium: 9.5 mg/dL (ref 8.9–10.3)
Chloride: 99 mmol/L (ref 98–111)
Creatinine, Ser: 0.91 mg/dL (ref 0.61–1.24)
GFR, Estimated: >60 mL/min (ref >60)
Glucose, Bld: 111 mg/dL — ABNORMAL HIGH (ref 70–99)
Potassium: 4.3 mmol/L (ref 3.5–5.1)
Sodium: 137 mmol/L (ref 135–145)

## 2021-11-15 LAB — CULTURE, BLOOD (ROUTINE X 2)
Culture: NO GROWTH
Culture: NO GROWTH

## 2021-11-15 LAB — CBC
HCT: 40 % (ref 39.0–52.0)
Hemoglobin: 13.3 g/dL (ref 13.0–17.0)
MCH: 29.3 pg (ref 26.0–34.0)
MCHC: 33.3 g/dL (ref 30.0–36.0)
MCV: 88.1 fL (ref 80.0–100.0)
Platelets: 231 10*3/uL (ref 150–400)
RBC: 4.54 MIL/uL (ref 4.22–5.81)
RDW: 13.5 % (ref 11.5–15.5)
WBC: 9.8 10*3/uL (ref 4.0–10.5)
nRBC: 0 % (ref 0.0–0.2)

## 2021-11-15 MED ORDER — SCOPOLAMINE 1 MG/3DAYS TD PT72
1.0000 | MEDICATED_PATCH | TRANSDERMAL | Status: DC
  Administered 2021-11-15: 1.5 mg via TRANSDERMAL
  Filled 2021-11-15: qty 1

## 2021-11-15 NOTE — Progress Notes (Signed)
PROGRESS NOTE    Jared Mullen  JQB:[id number] DOB: 01-06-1941 DOA: 11/09/2021 PCP: Birdie Sons, MD   Assessment & Plan:   Principal Problem:   Abdominal pain Active Problems:   Lung mass   Demand ischemia (HCC)   Elevated troponin   Nausea   Prostate cancer (HCC)   Protein-calorie malnutrition, severe   Palliative care encounter   Goals of care, counseling/discussion  Generalized weakness: PT recs SNF.  OT recs HH  Nausea: zofran prn. Will order scopolamine patch   Fever: etiology unclear. Afebrile for at 72 hrs. Blood cxs NGTD. Urine cx grows insignificant growth   Elevated troponins: likely secondary to demand ischemia. No chest pain. Echo shows normal EF. Continue on statin, metoprolol   Large lung mass: w/ lymph nodes. MRI brain was negative for metastases. S/p lung mass biopsy. Path pending    Hx of prostate cancer: management as per onco   Cyst on liver: on previous MRI. Continue outpatient monitoring    Hyponatremia: resolved   Severe protein calorie malnutrition: continue on nutritional supplements  DVT prophylaxis: SCDs Code Status: full  Family Communication: discussed pt's daughter, Linus Orn, at bedside and answered her questions Disposition Plan: d/c back home w/ HH   Level of care: Med-Surg  Status is: Inpatient  Remains inpatient appropriate because: pt c/o nausea    Consultants:  Onco Pulmon   Procedures:   Antimicrobials:  Subjective: Pt c/o nausea  Objective: Vitals:   11/14/21 1922 11/14/21 2247 11/15/21 0451 11/15/21 0836  BP: 112/61 116/76 109/64 114/71  Pulse: 89 77 72 84  Resp: 16 16 16 16   Temp: 99.7 F (37.6 C) 98.5 F (36.9 C) 98.5 F (36.9 C) 98 F (36.7 C)  TempSrc: Oral Oral Oral Oral  SpO2: 96% 95% 99% 100%  Weight:      Height:        Intake/Output Summary (Last 24 hours) at 11/15/2021 0946 Last data filed at 11/15/2021 0602 Gross per 24 hour  Intake 240 ml  Output 0 ml  Net 240 ml   Filed Weights    11/09/21 1139 11/09/21 2110  Weight: 61.2 kg 61.2 kg    Examination:  General exam: Appears calm but uncomfortable  Respiratory system: decreased breath sounds b/l  Cardiovascular system: \S1/S2+. No rubs or gallops Gastrointestinal system: Abd is soft, NT, ND & hypoactive bowel sounds Central nervous system: alert and oriented. Moves all extremities  Psychiatry: Judgement and insight appears normal. Flat mood and affect    Data Reviewed: I have personally reviewed following labs and imaging studies  CBC: Recent Labs  Lab 11/09/21 1536 11/10/21 0143 11/11/21 0505 11/12/21 0451 11/13/21 0455 11/14/21 0642 11/15/21 0447  WBC 7.7   < > 8.1 8.0 8.0 10.7* 9.8  NEUTROABS 5.6  --   --   --   --   --   --   HGB 14.1   < > 12.5* 12.8* 13.5 13.0 13.3  HCT 42.0   < > 37.5* 38.5* 40.4 39.4 40.0  MCV 88.6   < > 88.7 88.1 87.1 88.3 88.1  PLT 215   < > 184 205 228 230 231   < > = values in this interval not displayed.   Basic Metabolic Panel: Recent Labs  Lab 11/11/21 0505 11/12/21 0451 11/13/21 0455 11/14/21 0642 11/15/21 0447  NA 135 134* 134* 135 137  K 3.9 4.1 4.6 4.0 4.3  CL 100 99 101 101 99  CO2 27 28 27 28  29  GLUCOSE 112* 116* 177* 111* 111*  BUN 16 14 21  24* 22  CREATININE 0.72 0.75 0.75 0.62 0.91  CALCIUM 8.7* 9.0 9.3 9.1 9.5   GFR: Estimated Creatinine Clearance: 54.2 mL/min (by C-G formula based on SCr of 0.91 mg/dL). Liver Function Tests: Recent Labs  Lab 11/09/21 1536  AST 17  ALT 13  ALKPHOS 71  BILITOT 1.2  PROT 7.4  ALBUMIN 3.5   Recent Labs  Lab 11/09/21 1536  LIPASE 26   No results for input(s): AMMONIA in the last 168 hours. Coagulation Profile: No results for input(s): INR, PROTIME in the last 168 hours. Cardiac Enzymes: No results for input(s): CKTOTAL, CKMB, CKMBINDEX, TROPONINI in the last 168 hours. BNP (last 3 results) No results for input(s): PROBNP in the last 8760 hours. HbA1C: No results for input(s): HGBA1C in the last 72  hours. CBG: No results for input(s): GLUCAP in the last 168 hours. Lipid Profile: No results for input(s): CHOL, HDL, LDLCALC, TRIG, CHOLHDL, LDLDIRECT in the last 72 hours.  Thyroid Function Tests: No results for input(s): TSH, T4TOTAL, FREET4, T3FREE, THYROIDAB in the last 72 hours.  Anemia Panel: No results for input(s): VITAMINB12, FOLATE, FERRITIN, TIBC, IRON, RETICCTPCT in the last 72 hours. Sepsis Labs: Recent Labs  Lab 11/09/21 2028 11/10/21 1826 11/11/21 0505 11/12/21 0451  PROCALCITON 0.10 <0.10 0.10 <0.10    Recent Results (from the past 240 hour(s))  Resp Panel by RT-PCR (Flu A&B, Covid) Nasopharyngeal Swab     Status: None   Collection Time: 11/09/21  5:34 PM   Specimen: Nasopharyngeal Swab; Nasopharyngeal(NP) swabs in vial transport medium  Result Value Ref Range Status   SARS Coronavirus 2 by RT PCR NEGATIVE NEGATIVE Final    Comment: (NOTE) SARS-CoV-2 target nucleic acids are NOT DETECTED.  The SARS-CoV-2 RNA is generally detectable in upper respiratory specimens during the acute phase of infection. The lowest concentration of SARS-CoV-2 viral copies this assay can detect is 138 copies/mL. A negative result does not preclude SARS-Cov-2 infection and should not be used as the sole basis for treatment or other patient management decisions. A negative result may occur with  improper specimen collection/handling, submission of specimen other than nasopharyngeal swab, presence of viral mutation(s) within the areas targeted by this assay, and inadequate number of viral copies(<138 copies/mL). A negative result must be combined with clinical observations, patient history, and epidemiological information. The expected result is Negative.  Fact Sheet for Patients:  EntrepreneurPulse.com.au  Fact Sheet for Healthcare Providers:  IncredibleEmployment.be  This test is no t yet approved or cleared by the [place] FDA and   has been authorized for detection and/or diagnosis of SARS-CoV-2 by FDA under an Emergency Use Authorization (EUA). This EUA will remain  in effect (meaning this test can be used) for the duration of the COVID-19 declaration under Section 564(b)(1) of the Act, 21 U.S.C.section 360bbb-3(b)(1), unless the authorization is terminated  or revoked sooner.       Influenza A by PCR NEGATIVE NEGATIVE Final   Influenza B by PCR NEGATIVE NEGATIVE Final    Comment: (NOTE) The Xpert Xpress SARS-CoV-2/FLU/RSV plus assay is intended as an aid in the diagnosis of influenza from Nasopharyngeal swab specimens and should not be used as a sole basis for treatment. Nasal washings and aspirates are unacceptable for Xpert Xpress SARS-CoV-2/FLU/RSV testing.  Fact Sheet for Patients: EntrepreneurPulse.com.au  Fact Sheet for Healthcare Providers: IncredibleEmployment.be  This test is not yet approved or cleared by the [place] and  has been authorized for detection and/or diagnosis of SARS-CoV-2 by FDA under an Emergency Use Authorization (EUA). This EUA will remain in effect (meaning this test can be used) for the duration of the COVID-19 declaration under Section 564(b)(1) of the Act, 21 U.S.C. section 360bbb-3(b)(1), unless the authorization is terminated or revoked.  Performed at [facility], [address] [place], [place] [address]   CULTURE, BLOOD (ROUTINE X 2) w Reflex to ID Panel     Status: None   Collection Time: 11/10/21  6:26 PM   Specimen: BLOOD  Result Value Ref Range Status   Specimen Description BLOOD BLOOD RIGHT WRIST  Final   Special Requests BOTTLES DRAWN AEROBIC AND ANAEROBIC BCAV  Final   Culture   Final    NO GROWTH 5 DAYS Performed at [facility], [address] [place], [place] [address]    Report Status 11/15/2021 FINAL  Final  CULTURE, BLOOD (ROUTINE X 2) w Reflex to ID Panel     Status: None    Collection Time: 11/10/21  7:19 PM   Specimen: BLOOD  Result Value Ref Range Status   Specimen Description BLOOD BLOOD RIGHT HAND  Final   Special Requests BOTTLES DRAWN AEROBIC AND ANAEROBIC BCAV  Final   Culture   Final    NO GROWTH 5 DAYS Performed at [facility], [address] [place], [place] [address]    Report Status 11/15/2021 FINAL  Final  Urine Culture     Status: Abnormal   Collection Time: 11/11/21  4:45 PM   Specimen: Urine, Random  Result Value Ref Range Status   Specimen Description   Final    URINE, RANDOM Performed at [facility], [address] [place], [place] [address]    Special Requests   Final    NONE Performed at [facility], [address] [place], [place] [address]    Culture (A)  Final    <10,000 COLONIES/mL INSIGNIFICANT GROWTH Performed at [facility], [place] [address], [place], [place] [address]    Report Status 11/13/2021 FINAL  Final         Radiology Studies: No results found.      Scheduled Meds:  atorvastatin  80 mg Oral Daily   feeding supplement  237 mL Oral TID BM   melatonin  5 mg Oral QHS   metoprolol tartrate  12.5 mg Oral BID   multivitamin with minerals  1 tablet Oral Daily   pantoprazole  40 mg Oral BID   Continuous Infusions:     LOS: 5 days    Time spent: 15 mins    Wyvonnia Dusky, MD [facility] Pager [phone]  If 7PM-7AM, please contact night-coverage 11/15/2021, 9:46 AM

## 2021-11-15 NOTE — TOC Progression Note (Signed)
Transition of Care [facility]) - Progression Note    Patient Details  Name: Jared Mullen MRN: [id number] Date of Birth: 03/03/1941  [facility]) CM/SW Contact  Izola Price, RN Phone Number: 11/15/2021, 9:21 AM  Clinical Narrative:   Update 1/29: Patient did not discharge after all after receiving and enema and having some nausea. Pending disposition/medical progress today. Simmie Davies RN CM     Expected Discharge Plan: Skilled Nursing Facility Barriers to Discharge: Barriers Resolved  Expected Discharge Plan and Services Expected Discharge Plan: [place] arrangements for the past 2 months: Single Family Home Expected Discharge Date: 11/14/21               DME Arranged: 3-N-1 DME Agency: [place]: [place] Date [facility] Agency Contacted: 11/14/21 Time [place]: 1205 Representative spoke with at [place]: Alvis Lemmings confirmed with [employer]   Social Determinants of Health (SDOH) Interventions    Readmission Risk Interventions No flowsheet data found.

## 2021-11-15 NOTE — Progress Notes (Signed)
PULMONOLOGY         Date: 11/15/2021,   MRN# [id number] Jared Mullen [birth year]     AdmissionWeight: 61.2 kg                 CurrentWeight: 61.2 kg   Referring physician: Dr Jimmye Norman   CHIEF COMPLAINT:   Left lung mass   HISTORY OF PRESENT ILLNESS   Patient is an 81 yo M with hx of smoking from age 53 to 53 and has not smoked since then.  He has had cough for past 1 yr. He does admit to worsening cough and fatigue. He has left lung mass and we reviewed CT chest together today.   He underwent CT angio chest which showed left perihilar upper lobe masslike opacity measuring 6.6 x 5 x 6.1 cm contiguous with the hilum.  Margins are irregular and spiculated with linear extension into the periphery of the left upper lobe.  Findings concerning for primary bronchogenic malignancy.    11/12/21- Patient NPO scheduled for bronchoscopy noon today. No overnight events, good urine output , vitals stable on room air.   11/13/21- patient is stable with no overnight events. He had PT/OT today.  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 She had oncology evaluation with Dr Janese Banks today. Cytology is in process s/p bronchoscopy.   11/15/21- patient is stable on room air able to ambulate without desaturation.  He has abdominal discomfort and nausea with constipation and reports no bowel movement for more then 1 wk. He is passing flatus and does not seem to have SBO and we plan to have KUB today due to complaints of abd dicomfort.   PAST MEDICAL HISTORY   Past Medical History:  Diagnosis Date   Abnormal ECG    a. baseline inferolateral ST elevation dating back to at least 2012.   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   Sleep apnea    Urinary frequency    Weak urinary stream    Weight loss      SURGICAL HISTORY   Past Surgical History:   Procedure Laterality Date   biospy  09/30/2011-   TRUS/Bx Prostate - 5/12/biopsies positive for cancer, glandular size 32.5 cc   CHOLECYSTECTOMY  1992   RADIOACTIVE SEED IMPLANT  02/09/2012   Procedure: RADIOACTIVE SEED IMPLANT;  Surgeon: Franchot Gallo, MD;  Location: [facility];  Service: Urology;  Laterality: N/A;  RAD TECH OK PER ANNE AT MAIN OR    UVULOPALATOPHARYNGOPLASTY     VIDEO BRONCHOSCOPY WITH ENDOBRONCHIAL ULTRASOUND Bilateral 11/12/2021   Procedure: VIDEO BRONCHOSCOPY WITH ENDOBRONCHIAL ULTRASOUND;  Surgeon: Ottie Glazier, MD;  Location: [facility];  Service: Thoracic;  Laterality: Bilateral;     FAMILY HISTORY   Family History  Problem Relation Age of Onset   Heart disease Father    Breast cancer Sister    Cancer Sister        lung   Lung cancer Brother    Cancer Brother        bladder   Lung cancer Sister    Cancer Sister        breast   Kidney disease Brother      SOCIAL HISTORY   Social History   Tobacco Use   Smoking status: Former    Packs/day:  2.00    Years: 28.00    Pack years: 56.00    Types: Cigarettes    Quit date: 10/18/1980    Years since quitting: 41.1   Smokeless tobacco: Never  Substance Use Topics   Alcohol use: No   Drug use: No     MEDICATIONS    Home Medication:    Current Medication:  Current Facility-Administered Medications:    acetaminophen (TYLENOL) tablet 650 mg, 650 mg, Oral, Q6H PRN, 650 mg at 11/10/21 1533 **OR** acetaminophen (TYLENOL) suppository 650 mg, 650 mg, Rectal, Q6H PRN, Para Skeans, MD   atorvastatin (LIPITOR) tablet 80 mg, 80 mg, Oral, Daily, Theora Gianotti, NP, 80 mg at 11/15/21 0900   feeding supplement (ENSURE ENLIVE / ENSURE PLUS) liquid 237 mL, 237 mL, Oral, TID BM, Wieting, Richard, MD, 237 mL at 11/14/21 2251   hydrALAZINE (APRESOLINE) injection 5 mg, 5 mg, Intravenous, Q6H PRN, Para Skeans, MD   HYDROcodone-acetaminophen (NORCO/VICODIN) 5-325 MG per tablet 1  tablet, 1 tablet, Oral, Q6H PRN, Borders, Kirt Boys, NP   melatonin tablet 5 mg, 5 mg, Oral, QHS, Florina Ou V, MD, 5 mg at 11/14/21 2251   metoprolol tartrate (LOPRESSOR) tablet 12.5 mg, 12.5 mg, Oral, BID, Theora Gianotti, NP, 12.5 mg at 11/15/21 0901   morphine 2 MG/ML injection 1 mg, 1 mg, Intravenous, Q6H PRN, Florina Ou V, MD, 1 mg at 11/14/21 2131   multivitamin with minerals tablet 1 tablet, 1 tablet, Oral, Daily, Wieting, Richard, MD, 1 tablet at 11/15/21 0900   nitroGLYCERIN (NITROSTAT) SL tablet 0.4 mg, 0.4 mg, Sublingual, Q5 min PRN, Para Skeans, MD   ondansetron [facility]) injection 4 mg, 4 mg, Intravenous, Q6H PRN, Leslye Peer, Richard, MD, 4 mg at 11/14/21 2119   pantoprazole (PROTONIX) EC tablet 40 mg, 40 mg, Oral, BID, Dallie Piles, RPH, 40 mg at 11/15/21 0900   senna-docusate (Senokot-S) tablet 1 tablet, 1 tablet, Oral, QHS PRN, Borders, Kirt Boys, NP, 1 tablet at 11/12/21 2122    ALLERGIES   Patient has no known allergies.     REVIEW OF SYSTEMS    Review of Systems:  Gen:  Denies  fever, sweats, chills weigh loss  HEENT: Denies blurred vision, double vision, ear pain, eye pain, hearing loss, nose bleeds, sore throat Cardiac:  No dizziness, chest pain or heaviness, chest tightness,edema Resp:   Denies cough or sputum porduction, shortness of breath,wheezing, hemoptysis,  Gi: Denies swallowing difficulty, stomach pain, nausea or vomiting, diarrhea, constipation, bowel incontinence Gu:  Denies bladder incontinence, burning urine Ext:   Denies Joint pain, stiffness or swelling Skin: Denies  skin rash, easy bruising or bleeding or hives Endoc:  Denies polyuria, polydipsia , polyphagia or weight change Psych:   Denies depression, insomnia or hallucinations   Other:  All other systems negative   VS: BP 114/71 (BP Location: Left Arm)    Pulse 84    Temp 98 F (36.7 C) (Oral)    Resp 16    Ht 5\' 9"  (1.753 m)    Wt 61.2 kg    SpO2 100%    BMI 19.94 kg/m       PHYSICAL EXAM    GENERAL:NAD, no fevers, chills, no weakness no fatigue HEAD: Normocephalic, atraumatic.  EYES: Pupils equal, round, reactive to light. Extraocular muscles intact. No scleral icterus.  MOUTH: Moist mucosal membrane. Dentition intact. No abscess noted.  EAR, NOSE, THROAT: Clear without exudates. No external lesions.  NECK: Supple. No thyromegaly. No nodules.  No JVD.  PULMONARY: Diffuse coarse rhonchi right sided +wheezes CARDIOVASCULAR: S1 and S2. Regular rate and rhythm. No murmurs, rubs, or gallops. No edema. Pedal pulses 2+ bilaterally.  GASTROINTESTINAL: Soft, nontender, nondistended. No masses. Positive bowel sounds. No hepatosplenomegaly.  MUSCULOSKELETAL: No swelling, clubbing, or edema. Range of motion full in all extremities.  NEUROLOGIC: Cranial nerves II through XII are intact. No gross focal neurological deficits. Sensation intact. Reflexes intact.  SKIN: No ulceration, lesions, rashes, or cyanosis. Skin warm and dry. Turgor intact.  PSYCHIATRIC: Mood, affect within normal limits. The patient is awake, alert and oriented x 3. Insight, judgment intact.       IMAGING    DG Chest 2 View  Result Date: 11/09/2021 CLINICAL DATA:  Nausea, belching decreased appetite for 2 months, history prostate cancer EXAM: CHEST - 2 VIEW COMPARISON:  03/07/2017 rib radiographs, chest radiograph 11/16/2016 FINDINGS: Normal heart size, mediastinal contours, and pulmonary vascularity. Small calcifications in the chest bilaterally again identified likely calcified pleural plaques. New LEFT perihilar mass 6.6 x 6.4 cm highly suspicious for pulmonary neoplasm. No acute infiltrate, pleural effusion or pneumothorax. No definite acute bone lesions. IMPRESSION: New LEFT perihilar mass 6.6 x 6.4 cm highly suspicious for a pulmonary neoplasm; CT chest with contrast recommended for further evaluation. Calcifications in the chest bilaterally most consistent with calcified pleural plaques,  unchanged, question prior asbestos exposure. Aortic Atherosclerosis (ICD10-I70.0). Electronically Signed   By: Lavonia Dana M.D.   On: 11/09/2021 17:09   CT Angio Chest PE W/Cm &/Or Wo Cm  Result Date: 11/09/2021 CLINICAL DATA:  Pulmonary embolism (PE) suspected, high prob Two month history of nausea and decreased appetite. Symptoms after patient's wife done 2 months ago. EXAM: CT ANGIOGRAPHY CHEST WITH CONTRAST TECHNIQUE: Multidetector CT imaging of the chest was performed using the standard protocol during bolus administration of intravenous contrast. Multiplanar CT image reconstructions and MIPs were obtained to evaluate the vascular anatomy. RADIATION DOSE REDUCTION: This exam was performed according to the departmental dose-optimization program which includes automated exposure control, adjustment of the mA and/or kV according to patient size and/or use of iterative reconstruction technique. CONTRAST:  109mL OMNIPAQUE IOHEXOL 350 MG/ML SOLN COMPARISON:  Chest radiograph earlier today. FINDINGS: Cardiovascular: There are no filling defects within the pulmonary arteries to suggest pulmonary embolus. The lingular pulmonary arteries are attenuated due to left perihilar mass. Atherosclerosis of the thoracic aorta. No aortic aneurysm or acute aortic findings. The heart is normal in size. There is no pericardial effusion. Mediastinum/Nodes: Left upper lobe perihilar mass is contiguous with the hilum. There is likely separate suprahilar adenopathy within 18 mm lymph node series 5, image 46. Likely infrahilar lymph node measuring 2.4 cm series 5, image 58. 9 mm right hilar node. Occasional prominent left lower paratracheal nodes, including a 10 mm node series 5, image 38. No definite supraclavicular adenopathy. No visualized thyroid nodule. Upper esophagus is patulous. No esophageal wall thickening. Lungs/Pleura: Left perihilar upper lobe masslike opacity. This measures 6.6 x 5.0 x 6.1 cm, measured on series 7, image  46 and series 8, image 55. Margins are irregular and spiculated with linear extension to the periphery of the pleural surface of the left upper lobe. There are small adjacent satellite nodules. Separate left upper lobe 3 mm nodule series 7, image 32. Irregular opacity at the left lung apex which appears triangular measuring 11 mm, series 7, image 21. Biapical pleuroparenchymal scarring, some of which is calcified. Bilateral calcified pleural plaques. Multiple calcified granuloma in the right lower  lobe. Severe narrowing of the left upper lobe bronchus due to in left perihilar mass. No pleural effusion. Upper Abdomen: Innumerable low-density lesions within the liver, largest lesions appear to be simple fluid density. Previous abdominal MRI 06/22/2019 demonstrated multiple hepatic cysts. No discrete adrenal nodule. Musculoskeletal: Slight flattening of mid thoracic vertebra. No destructive lytic or focal blastic osseous lesion. Review of the MIP images confirms the above findings. IMPRESSION: 1. No pulmonary embolus. 2. Left perihilar upper lobe masslike opacity measuring 6.6 x 5.0 x 6.1 cm, contiguous with the hilum. Margins are irregular and spiculated with linear extension to the periphery of the left upper lobe. Findings are highly suspicious for primary bronchogenic malignancy. 3. Perihilar mass is contiguous with the hilum, however there appears to be a discrete left hilar adenopathy. Prominent left lower paratracheal nodes, and a nonspecific 9 mm right hilar node. 4. Separate 3 mm left upper lobe nodule. Irregular opacity at the left lung apex appears triangular measuring 11 mm, nonspecific but may be scarring. 5. Severe narrowing of the left upper lobe bronchus due to left perihilar mass. 6. Innumerable low-density lesions within the liver, largest lesions appear to be simple fluid density. These were previously characterized as hepatic cysts on MRI, although not well assessed on the current exam due to phase  of contrast. 7. 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 Aortic Atherosclerosis (ICD10-I70.0). Electronically Signed   By: Keith Rake M.D.   On: 11/09/2021 18:02   MR BRAIN W WO CONTRAST  Result Date: 11/10/2021 CLINICAL DATA:  Metastatic disease evaluation, lung mass EXAM: MRI HEAD WITHOUT AND WITH CONTRAST TECHNIQUE: Multiplanar, multiecho pulse sequences of the brain and surrounding structures were obtained without and with intravenous contrast. CONTRAST:  62mL GADAVIST GADOBUTROL 1 MMOL/ML IV SOLN COMPARISON:  None. FINDINGS: Brain: There is no acute infarction or intracranial hemorrhage. There is no intracranial mass, mass effect, or edema. There is no hydrocephalus or extra-axial fluid collection. Prominence of the ventricles and sulci reflects parenchymal volume loss. Patchy T2 hyperintensity in the supratentorial white matter is nonspecific but may reflect minor chronic microvascular ischemic changes. No abnormal enhancement. Vascular: Major vessel flow voids at the skull base are preserved. Skull and upper cervical spine: Normal marrow signal is preserved. Sinuses/Orbits: Paranasal sinus mucosal thickening. Orbits are unremarkable. Other: Sella is unremarkable.  Mastoid air cells are clear. IMPRESSION: No evidence of intracranial metastatic disease. Electronically Signed   By: Macy Mis M.D.   On: 11/10/2021 14:37   CT ABDOMEN PELVIS W CONTRAST  Result Date: 11/11/2021 CLINICAL DATA:  Non-small cell lung cancer staging. EXAM: CT ABDOMEN AND PELVIS WITH CONTRAST TECHNIQUE: Multidetector CT imaging of the abdomen and pelvis was performed using the standard protocol following bolus administration of intravenous contrast. RADIATION DOSE REDUCTION: This exam was performed according to the departmental dose-optimization program which includes automated exposure control, adjustment of the mA and/or kV according to patient size and/or use of iterative reconstruction  technique. CONTRAST:  86mL OMNIPAQUE IOHEXOL 300 MG/ML  SOLN COMPARISON:  Chest CTA 11/09/2021, abdominal MRI 06/22/2019 and abdominal ultrasound 01/02/2018. FINDINGS: Lower chest: Calcified pleural plaque, pleural thickening and linear scarring at both lung bases. The known left hilar mass is not imaged on this abdominal CT. No significant pleural or pericardial effusion. Aortic and coronary artery atherosclerosis are noted. Hepatobiliary: Numerous hepatic cysts are again noted, similar to previous MRI. No suspicious liver lesions are identified. There is no significant biliary dilatation status post cholecystectomy. Pancreas: Unremarkable. No pancreatic ductal dilatation  or surrounding inflammatory changes. Spleen: Normal in size without focal abnormality. Adrenals/Urinary Tract: Both adrenal glands appear normal. Small bilateral renal cysts. No evidence of renal mass, urinary tract calculus or hydronephrosis. The bladder appears unremarkable. Stomach/Bowel: Enteric contrast was administered and has passed into the rectum. The stomach appears unremarkable for its degree of distension. No evidence of bowel wall thickening, distention or surrounding inflammatory change. The appendix appears normal. Moderate stool throughout the colon. Vascular/Lymphatic: There are no enlarged abdominal or pelvic lymph nodes. Diffuse aortic and branch vessel atherosclerosis. No evidence of aneurysm or large vessel occlusion. Reproductive: Multiple brachytherapy seeds are present within the prostate gland which otherwise appears unremarkable. Other: No ascites or peritoneal nodularity. Musculoskeletal: There is a lytic destructive mass within the right iliac bone with an associated soft tissue mass, highly suspicious for metastatic disease. This measures 3.7 x 3.2 cm on image 59/2. No other suspicious osseous findings are seen. There are degenerative changes throughout the lumbar spine which contribute to mild spinal stenosis and  foraminal narrowing at the lower 4 disc space levels. IMPRESSION: 1. 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 As this patient also has a remote history of prostate cancer, correlation with serum PSA levels recommended, although there are no blastic lesions. 2. No other evidence of metastatic disease in the abdomen or pelvis. 3. Innumerable hepatic cysts, similar to previous MRI. Small renal cysts. 4. Multilevel lumbar spondylosis. 5. Aortic Atherosclerosis (ICD10-I70.0) and Emphysema (ICD10-J43.9). Electronically Signed   By: Richardean Sale M.D.   On: 11/11/2021 09:10   DG Chest Port 1 View  Result Date: 11/12/2021 CLINICAL DATA:  Status post left bronchoscopy EXAM: PORTABLE CHEST 1 VIEW COMPARISON:  Previous studies including the examination of 11/09/2021 FINDINGS: Cardiac size is within normal limits. There is 7.5 cm homogeneous opacity in the left hilum and parahilar region with no significant interval change. There is no pneumothorax. Increased interstitial markings are seen in both lower lung fields which may be partly due to poor inspiration. There is minimal blunting of left lateral CP angle. There are scattered pleural calcifications. IMPRESSION: There is 7.5 cm opacity in the left parahilar region suggesting neoplastic process. Subtle increased markings in the lower lung fields may be due to poor inspiration. There is blunting of left lateral CP angle suggesting minimal effusion. Electronically Signed   By: Elmer Picker M.D.   On: 11/12/2021 15:37   DG C-Arm 1-60 Min-No Report  Result Date: 11/12/2021 Fluoroscopy was utilized by the requesting physician.  No radiographic interpretation.   ECHOCARDIOGRAM COMPLETE  Result Date: 11/10/2021    ECHOCARDIOGRAM REPORT   Patient Name:   ZAKARIA SEDOR Date of Exam: 11/10/2021 Medical Rec #:  [id number]   Height:       69.0 in Accession #:    [id number]  Weight:       135.0 lb Date  of Birth:  [birth year]    BSA:          1.748 m Patient Age:    14 years    BP:           129/84 mmHg Patient Gender: M           HR:           78 bpm. Exam Location:  [facility] Procedure: 2D Echo, Color Doppler, Cardiac Doppler and Strain Analysis Indications:     R07.9 Chest Pain  History:  Patient has no prior history of Echocardiogram examinations.                  Risk Factors:Sleep Apnea.  Sonographer:     Charmayne Sheer Referring Phys:  [place] Diagnosing Phys: Ida Rogue MD  Sonographer Comments: Suboptimal parasternal window. Global longitudinal strain was attempted. IMPRESSIONS  1. Left ventricular ejection fraction, by estimation, is 55 to 60%. The left ventricle has normal function. The left ventricle has no regional wall motion abnormalities. Left ventricular diastolic parameters are consistent with Grade I diastolic dysfunction (impaired relaxation). The average left ventricular global longitudinal strain is -18.0 %. The global longitudinal strain is normal.  2. Right ventricular systolic function is normal. The right ventricular size is normal.  3. The mitral valve is normal in structure. No evidence of mitral valve regurgitation. No evidence of mitral stenosis.  4. The aortic valve was not well visualized. Aortic valve regurgitation is not visualized. No aortic stenosis is present.  5. The inferior vena cava is normal in size with greater than 50% respiratory variability, suggesting right atrial pressure of 3 mmHg. FINDINGS  Left Ventricle: Left ventricular ejection fraction, by estimation, is 55 to 60%. The left ventricle has normal function. The left ventricle has no regional wall motion abnormalities. The average left ventricular global longitudinal strain is -18.0 %. The global longitudinal strain is normal. The left ventricular internal cavity size was normal in size. There is no left ventricular hypertrophy. Left ventricular diastolic parameters are consistent with Grade I  diastolic dysfunction (impaired relaxation). Right Ventricle: The right ventricular size is normal. No increase in right ventricular wall thickness. Right ventricular systolic function is normal. Left Atrium: Left atrial size was normal in size. Right Atrium: Right atrial size was normal in size. Pericardium: There is no evidence of pericardial effusion. Mitral Valve: The mitral valve is normal in structure. No evidence of mitral valve regurgitation. No evidence of mitral valve stenosis. MV peak gradient, 2.4 mmHg. The mean mitral valve gradient is 1.0 mmHg. Tricuspid Valve: The tricuspid valve is normal in structure. Tricuspid valve regurgitation is not demonstrated. No evidence of tricuspid stenosis. Aortic Valve: The aortic valve was not well visualized. Aortic valve regurgitation is not visualized. No aortic stenosis is present. Aortic valve mean gradient measures 3.0 mmHg. Aortic valve peak gradient measures 5.4 mmHg. Aortic valve area, by VTI measures 2.93 cm. Pulmonic Valve: The pulmonic valve was normal in structure. Pulmonic valve regurgitation is not visualized. No evidence of pulmonic stenosis. Aorta: The aortic root is normal in size and structure. Venous: The inferior vena cava is normal in size with greater than 50% respiratory variability, suggesting right atrial pressure of 3 mmHg. IAS/Shunts: No atrial level shunt detected by color flow Doppler.  LEFT VENTRICLE PLAX 2D LVIDd:         4.03 cm   Diastology LVIDs:         2.25 cm   LV e' medial:    6.09 cm/s LV PW:         0.96 cm   LV E/e' medial:  11.1 LV IVS:        0.70 cm   LV e' lateral:   11.00 cm/s LVOT diam:     2.10 cm   LV E/e' lateral: 6.2 LV SV:         56 LV SV Index:   32        2D Longitudinal Strain LVOT Area:     3.46  cm  2D Strain GLS Avg:     -18.0 %  RIGHT VENTRICLE RV Basal diam:  3.68 cm LEFT ATRIUM             Index        RIGHT ATRIUM           Index LA diam:        2.70 cm 1.54 cm/m   RA Area:     13.10 cm LA Vol (A2C):    37.0 ml 21.16 ml/m  RA Volume:   31.20 ml  17.85 ml/m LA Vol (A4C):   25.1 ml 14.36 ml/m LA Biplane Vol: 31.8 ml 18.19 ml/m  AORTIC VALVE                    PULMONIC VALVE AV Area (Vmax):    2.77 cm     PV Vmax:       0.61 m/s AV Area (Vmean):   2.55 cm     PV Vmean:      41.500 cm/s AV Area (VTI):     2.93 cm     PV VTI:        0.105 m AV Vmax:           116.00 cm/s  PV Peak grad:  1.5 mmHg AV Vmean:          81.000 cm/s  PV Mean grad:  1.0 mmHg AV VTI:            0.190 m AV Peak Grad:      5.4 mmHg AV Mean Grad:      3.0 mmHg LVOT Vmax:         92.70 cm/s LVOT Vmean:        59.600 cm/s LVOT VTI:          0.161 m LVOT/AV VTI ratio: 0.85  AORTA Ao Root diam: 3.60 cm MITRAL VALVE MV Area (PHT): 3.97 cm    SHUNTS MV Area VTI:   2.77 cm    Systemic VTI:  0.16 m MV Peak grad:  2.4 mmHg    Systemic Diam: 2.10 cm MV Mean grad:  1.0 mmHg MV Vmax:       0.78 m/s MV Vmean:      55.6 cm/s MV Decel Time: 191 msec MV E velocity: 67.70 cm/s MV A velocity: 81.80 cm/s MV E/A ratio:  0.83 Ida Rogue MD Electronically signed by Ida Rogue MD Signature Date/Time: 11/10/2021/2:12:02 PM    Final       ASSESSMENT/PLAN   6cm left lung mass- ADENOCARCINOMA - patient and daughter Berenda Morale was present and we discussed everything together including imaging and biopsy plan.  - patient is agreeable to biopsy.                       Constipation    - patient is on narcotics we discussed holding pain meds he is agreeable and states anaglesia is adequately controlled   - he had lactulose and enema already and is passing flatus   - plan for KUB   Thank you for allowing me to participate in the care of this patient.   Patient/Family are satisfied with care plan and all questions have been answered.  This document was prepared using Dragon voice recognition software and may include unintentional dictation errors.     Ottie Glazier, M.D.  Division of [place]

## 2021-11-15 NOTE — Progress Notes (Signed)
Patient had nausea throughout shift, zofran given twice and morphine once for 10/10 abdominal pain. Passing gas, bloat feeling, per patient was able to have a small soft bowel movement around 7am.

## 2021-11-16 ENCOUNTER — Encounter: Payer: Self-pay | Admitting: *Deleted

## 2021-11-16 DIAGNOSIS — K5900 Constipation, unspecified: Secondary | ICD-10-CM

## 2021-11-16 DIAGNOSIS — R1013 Epigastric pain: Secondary | ICD-10-CM | POA: Diagnosis not present

## 2021-11-16 DIAGNOSIS — Z515 Encounter for palliative care: Secondary | ICD-10-CM | POA: Diagnosis not present

## 2021-11-16 LAB — BASIC METABOLIC PANEL
Anion gap: 7 (ref 5–15)
BUN: 14 mg/dL (ref 8–23)
CO2: 30 mmol/L (ref 22–32)
Calcium: 8.9 mg/dL (ref 8.9–10.3)
Chloride: 99 mmol/L (ref 98–111)
Creatinine, Ser: 0.87 mg/dL (ref 0.61–1.24)
GFR, Estimated: >60 mL/min (ref >60)
Glucose, Bld: 105 mg/dL — ABNORMAL HIGH (ref 70–99)
Potassium: 4.5 mmol/L (ref 3.5–5.1)
Sodium: 136 mmol/L (ref 135–145)

## 2021-11-16 LAB — CBC
HCT: 40.2 % (ref 39.0–52.0)
Hemoglobin: 13.2 g/dL (ref 13.0–17.0)
MCH: 29.6 pg (ref 26.0–34.0)
MCHC: 32.8 g/dL (ref 30.0–36.0)
MCV: 90.1 fL (ref 80.0–100.0)
Platelets: 231 10*3/uL (ref 150–400)
RBC: 4.46 MIL/uL (ref 4.22–5.81)
RDW: 13.3 % (ref 11.5–15.5)
WBC: 8.6 10*3/uL (ref 4.0–10.5)
nRBC: 0 % (ref 0.0–0.2)

## 2021-11-16 MED ORDER — FLEET ENEMA 7-19 GM/118ML RE ENEM
1.0000 | ENEMA | Freq: Once | RECTAL | Status: AC
  Administered 2021-11-16: 1 via RECTAL

## 2021-11-16 MED ORDER — BISACODYL 10 MG RE SUPP
10.0000 mg | Freq: Once | RECTAL | Status: AC
  Administered 2021-11-16: 10 mg via RECTAL
  Filled 2021-11-16: qty 1

## 2021-11-16 MED ORDER — PEG 3350-KCL-NA BICARB-NACL 420 G PO SOLR
4000.0000 mL | Freq: Once | ORAL | Status: AC
  Administered 2021-11-16: 4000 mL via ORAL
  Filled 2021-11-16: qty 4000

## 2021-11-16 MED ORDER — LACTULOSE 10 GM/15ML PO SOLN
10.0000 g | Freq: Two times a day (BID) | ORAL | Status: DC
  Administered 2021-11-16: 20 g via ORAL
  Filled 2021-11-16: qty 30

## 2021-11-16 MED ORDER — LACTULOSE 10 GM/15ML PO SOLN
30.0000 g | Freq: Two times a day (BID) | ORAL | Status: DC
  Administered 2021-11-16 (×2): 30 g via ORAL
  Filled 2021-11-16 (×3): qty 60

## 2021-11-16 NOTE — TOC Transition Note (Signed)
Transition of Care [facility]) - CM/SW Discharge Note   Patient Details  Name: Jared Mullen MRN: [id number] Date of Birth: April 18, 1941  Transition [facility]) CM/SW Contact:  Beverly Sessions, RN Phone Number: 11/16/2021, 12:24 PM   Clinical Narrative:    Patient to discharge today DME was delivered to room on Friday [facility] with [facility] notified of discharge    Final next level of care: [place] Barriers to Discharge: Barriers Resolved   Patient Goals and CMS Choice        Discharge Placement                       Discharge Plan and Services                DME Arranged: 3-N-1 DME Agency: [facility]       HH Arranged: PT, OT HH Agency: [facility] Date [place]: 11/16/21 Time [place]: 1205 Representative spoke with at [name]: [place] ([place]) Interventions     Readmission Risk Interventions No flowsheet data found.

## 2021-11-16 NOTE — Care Management Important Message (Signed)
Important Message  Patient Details  Name: Jared Mullen MRN: [id number] Date of Birth: [birth year]   Medicare Important Message Given:  Yes     Dannette Barbara 11/16/2021, 11:36 AM

## 2021-11-16 NOTE — Progress Notes (Signed)
Omniseq ordered on recent lung biopsy. Form faxed to pathology to arrange shipment.

## 2021-11-16 NOTE — Progress Notes (Signed)
Patient to be discharged after bowel movement per palliative NP.  Patient given enema, lactulosex 2 doses, prune juicex2, suppository without effect.  Disimpaction attempted however no stool within reach.  Golytely started as ordered then patient started with nausea, zofran given. Patient remains uncomfortable and distended.  MD notified  of no BM and nausea.  d/c order rescinded.  Patient will stay the night and MD will address progress in am. Family and patient grateful to stay.

## 2021-11-16 NOTE — Progress Notes (Signed)
[place] at [facility] [phone] [phone]   Name: Jared Mullen Date: 11/16/2021 MRN: [id number]  DOB: 01-16-1941  Patient Care Team: Birdie Sons, MD as PCP - General (Family Medicine) Rockey Situ Kathlene November, MD as PCP - Cardiology (Cardiology) Franchot Gallo, MD as Consulting Physician (Urology)    REASON FOR CONSULTATION: Jared Mullen is a 81 y.o. male with multiple medical problems including with multiple medical problems including history of prostate cancer, sleep apnea, admitted to the hospital 11/10/2021 for evaluation of progressive weakness and weight loss.  CTA of the chest revealed a left upper lobe lung mass.  CT of the abdomen and pelvis revealed a lytic lesion to the right iliac crest.  Patient is status post lung biopsy.  Palliative care was consulted to help address goals..   CODE STATUS: Full code  PAST MEDICAL HISTORY: Past Medical History:  Diagnosis Date   Abnormal ECG    a. baseline inferolateral ST elevation dating back to at least 2012.   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   Sleep apnea    Urinary frequency    Weak urinary stream    Weight loss     PAST SURGICAL HISTORY:  Past Surgical History:  Procedure Laterality Date   biospy  09/30/2011-   TRUS/Bx Prostate - 5/12/biopsies positive for cancer, glandular size 32.5 cc   CHOLECYSTECTOMY  1992   RADIOACTIVE SEED IMPLANT  02/09/2012   Procedure: RADIOACTIVE SEED IMPLANT;  Surgeon: Franchot Gallo, MD;  Location: [facility];  Service: Urology;  Laterality: N/A;  RAD TECH OK PER ANNE AT MAIN OR    UVULOPALATOPHARYNGOPLASTY     VIDEO BRONCHOSCOPY WITH ENDOBRONCHIAL ULTRASOUND Bilateral 11/12/2021   Procedure: VIDEO BRONCHOSCOPY WITH ENDOBRONCHIAL ULTRASOUND;  Surgeon: Ottie Glazier, MD;  Location: [facility] ORS;  Service: Thoracic;  Laterality: Bilateral;    HEMATOLOGY/ONCOLOGY HISTORY:  Oncology History   No history exists.    ALLERGIES:  has No Known Allergies.  MEDICATIONS:  Current Facility-Administered Medications  Medication Dose Route Frequency Provider Last Rate Last Admin   acetaminophen (TYLENOL) tablet 650 mg  650 mg Oral Q6H PRN Para Skeans, MD   650 mg at 11/10/21 1533   Or   acetaminophen (TYLENOL) suppository 650 mg  650 mg Rectal Q6H PRN Para Skeans, MD       atorvastatin (LIPITOR) tablet 80 mg  80 mg Oral Daily Theora Gianotti, NP   80 mg at 11/16/21 7673   bisacodyl (DULCOLAX) suppository 10 mg  10 mg Rectal Once Keelee Yankey, Kirt Boys, NP       feeding supplement (ENSURE ENLIVE / ENSURE PLUS) liquid 237 mL  237 mL Oral TID BM Loletha Grayer, MD   237 mL at 11/16/21 1313   hydrALAZINE (APRESOLINE) injection 5 mg  5 mg Intravenous Q6H PRN Para Skeans, MD       HYDROcodone-acetaminophen (NORCO/VICODIN) 5-325 MG per tablet 1 tablet  1 tablet Oral Q6H PRN Erin Obando, Kirt Boys, NP       lactulose (CHRONULAC) 10 GM/15ML solution 10-20 g  10-20 g Oral BID Lerlene Treadwell, Kirt Boys, NP   20 g at 11/16/21 1312   melatonin tablet 5 mg  5 mg Oral QHS Para Skeans, MD   5  mg at 11/15/21 2157   metoprolol tartrate (LOPRESSOR) tablet 12.5 mg  12.5 mg Oral BID Theora Gianotti, NP   12.5 mg at 11/16/21 0957   morphine 2 MG/ML injection 1 mg  1 mg Intravenous Q6H PRN Para Skeans, MD   1 mg at 11/14/21 2131   multivitamin with minerals tablet 1 tablet  1 tablet Oral Daily Loletha Grayer, MD   1 tablet at 11/16/21 0958   nitroGLYCERIN (NITROSTAT) SL tablet 0.4 mg  0.4 mg Sublingual Q5 min PRN Para Skeans, MD       ondansetron [facility]) injection 4 mg  4 mg Intravenous Q6H PRN Loletha Grayer, MD   4 mg at 11/14/21 2119   pantoprazole (PROTONIX) EC tablet 40 mg  40 mg Oral BID Dallie Piles, RPH   40 mg at 11/16/21 0959   scopolamine  (TRANSDERM-SCOP) 1 MG/3DAYS 1.5 mg  1 patch Transdermal Q72H Wyvonnia Dusky, MD   1.5 mg at 11/15/21 1722   senna-docusate (Senokot-S) tablet 1 tablet  1 tablet Oral QHS PRN Kealohilani Maiorino, Kirt Boys, NP   1 tablet at 11/12/21 2122    VITAL SIGNS: BP 115/74    Pulse 73    Temp 98.1 F (36.7 C) (Oral)    Resp 16    Ht 5\' 9"  (1.753 m)    Wt 135 lb (61.2 kg)    SpO2 98%    BMI 19.94 kg/m  Filed Weights   11/09/21 1139 11/09/21 2110  Weight: 135 lb (61.2 kg) 135 lb (61.2 kg)    Estimated body mass index is 19.94 kg/m as calculated from the following:   Height as of this encounter: 5\' 9"  (1.753 m).   Weight as of this encounter: 135 lb (61.2 kg).  LABS: CBC:    Component Value Date/Time   WBC 8.6 11/16/2021 0453   HGB 13.2 11/16/2021 0453   HGB 15.1 11/02/2021 0954   HCT 40.2 11/16/2021 0453   HCT 44.7 11/02/2021 0954   PLT 231 11/16/2021 0453   PLT 222 11/02/2021 0954   MCV 90.1 11/16/2021 0453   MCV 90 11/02/2021 0954   NEUTROABS 5.6 11/09/2021 1536   LYMPHSABS 0.9 11/09/2021 1536   MONOABS 1.0 11/09/2021 1536   EOSABS 0.1 11/09/2021 1536   BASOSABS 0.0 11/09/2021 1536   Comprehensive Metabolic Panel:    Component Value Date/Time   NA 136 11/16/2021 0453   NA 141 11/02/2021 0954   K 4.5 11/16/2021 0453   CL 99 11/16/2021 0453   CO2 30 11/16/2021 0453   BUN 14 11/16/2021 0453   BUN 17 11/02/2021 0954   CREATININE 0.87 11/16/2021 0453   GLUCOSE 105 (H) 11/16/2021 0453   CALCIUM 8.9 11/16/2021 0453   AST 17 11/09/2021 1536   ALT 13 11/09/2021 1536   ALKPHOS 71 11/09/2021 1536   BILITOT 1.2 11/09/2021 1536   BILITOT 0.6 11/02/2021 0954   PROT 7.4 11/09/2021 1536   PROT 7.0 11/02/2021 0954   ALBUMIN 3.5 11/09/2021 1536   ALBUMIN 3.8 11/02/2021 0954    RADIOGRAPHIC STUDIES: DG Chest 2 View  Result Date: 11/09/2021 CLINICAL DATA:  Nausea, belching decreased appetite for 2 months, history prostate cancer EXAM: CHEST - 2 VIEW COMPARISON:  03/07/2017 rib radiographs,  chest radiograph 11/16/2016 FINDINGS: Normal heart size, mediastinal contours, and pulmonary vascularity. Small calcifications in the chest bilaterally again identified likely calcified pleural plaques. New LEFT perihilar mass 6.6 x 6.4 cm highly suspicious for pulmonary neoplasm. No acute infiltrate,  pleural effusion or pneumothorax. No definite acute bone lesions. IMPRESSION: New LEFT perihilar mass 6.6 x 6.4 cm highly suspicious for a pulmonary neoplasm; CT chest with contrast recommended for further evaluation. Calcifications in the chest bilaterally most consistent with calcified pleural plaques, unchanged, question prior asbestos exposure. Aortic Atherosclerosis (ICD10-I70.0). Electronically Signed   By: Lavonia Dana M.D.   On: 11/09/2021 17:09   DG Abd 1 View  Result Date: 11/15/2021 CLINICAL DATA:  Abdominal pain and distention. Metastatic lung carcinoma. EXAM: ABDOMEN - 1 VIEW COMPARISON:  03/08/2017 FINDINGS: Gas-filled nondilated small bowel loops are seen. Moderate stool also noted, predominantly in the right colon. Right upper quadrant surgical clips again noted from prior cholecystectomy. Brachytherapy seeds seen in the prostate bed. IMPRESSION: Nonobstructive bowel gas pattern.  No acute findings. Electronically Signed   By: Marlaine Hind M.D.   On: 11/15/2021 13:09   CT Angio Chest PE W/Cm &/Or Wo Cm  Result Date: 11/09/2021 CLINICAL DATA:  Pulmonary embolism (PE) suspected, high prob Two month history of nausea and decreased appetite. Symptoms after patient's wife done 2 months ago. EXAM: CT ANGIOGRAPHY CHEST WITH CONTRAST TECHNIQUE: Multidetector CT imaging of the chest was performed using the standard protocol during bolus administration of intravenous contrast. Multiplanar CT image reconstructions and MIPs were obtained to evaluate the vascular anatomy. RADIATION DOSE REDUCTION: This exam was performed according to the departmental dose-optimization program which includes automated exposure  control, adjustment of the mA and/or kV according to patient size and/or use of iterative reconstruction technique. CONTRAST:  37mL OMNIPAQUE IOHEXOL 350 MG/ML SOLN COMPARISON:  Chest radiograph earlier today. FINDINGS: Cardiovascular: There are no filling defects within the pulmonary arteries to suggest pulmonary embolus. The lingular pulmonary arteries are attenuated due to left perihilar mass. Atherosclerosis of the thoracic aorta. No aortic aneurysm or acute aortic findings. The heart is normal in size. There is no pericardial effusion. Mediastinum/Nodes: Left upper lobe perihilar mass is contiguous with the hilum. There is likely separate suprahilar adenopathy within 18 mm lymph node series 5, image 46. Likely infrahilar lymph node measuring 2.4 cm series 5, image 58. 9 mm right hilar node. Occasional prominent left lower paratracheal nodes, including a 10 mm node series 5, image 38. No definite supraclavicular adenopathy. No visualized thyroid nodule. Upper esophagus is patulous. No esophageal wall thickening. Lungs/Pleura: Left perihilar upper lobe masslike opacity. This measures 6.6 x 5.0 x 6.1 cm, measured on series 7, image 46 and series 8, image 55. Margins are irregular and spiculated with linear extension to the periphery of the pleural surface of the left upper lobe. There are small adjacent satellite nodules. Separate left upper lobe 3 mm nodule series 7, image 32. Irregular opacity at the left lung apex which appears triangular measuring 11 mm, series 7, image 21. Biapical pleuroparenchymal scarring, some of which is calcified. Bilateral calcified pleural plaques. Multiple calcified granuloma in the right lower lobe. Severe narrowing of the left upper lobe bronchus due to in left perihilar mass. No pleural effusion. Upper Abdomen: Innumerable low-density lesions within the liver, largest lesions appear to be simple fluid density. Previous abdominal MRI 06/22/2019 demonstrated multiple hepatic cysts.  No discrete adrenal nodule. Musculoskeletal: Slight flattening of mid thoracic vertebra. No destructive lytic or focal blastic osseous lesion. Review of the MIP images confirms the above findings. IMPRESSION: 1. No pulmonary embolus. 2. Left perihilar upper lobe masslike opacity measuring 6.6 x 5.0 x 6.1 cm, contiguous with the hilum. Margins are irregular and spiculated with linear extension to  the periphery of the left upper lobe. Findings are highly suspicious for primary bronchogenic malignancy. 3. Perihilar mass is contiguous with the hilum, however there appears to be a discrete left hilar adenopathy. Prominent left lower paratracheal nodes, and a nonspecific 9 mm right hilar node. 4. Separate 3 mm left upper lobe nodule. Irregular opacity at the left lung apex appears triangular measuring 11 mm, nonspecific but may be scarring. 5. Severe narrowing of the left upper lobe bronchus due to left perihilar mass. 6. Innumerable low-density lesions within the liver, largest lesions appear to be simple fluid density. These were previously characterized as hepatic cysts on MRI, although not well assessed on the current exam due to phase of contrast. 7. 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 Aortic Atherosclerosis (ICD10-I70.0). Electronically Signed   By: Keith Rake M.D.   On: 11/09/2021 18:02   MR BRAIN W WO CONTRAST  Result Date: 11/10/2021 CLINICAL DATA:  Metastatic disease evaluation, lung mass EXAM: MRI HEAD WITHOUT AND WITH CONTRAST TECHNIQUE: Multiplanar, multiecho pulse sequences of the brain and surrounding structures were obtained without and with intravenous contrast. CONTRAST:  49mL GADAVIST GADOBUTROL 1 MMOL/ML IV SOLN COMPARISON:  None. FINDINGS: Brain: There is no acute infarction or intracranial hemorrhage. There is no intracranial mass, mass effect, or edema. There is no hydrocephalus or extra-axial fluid collection. Prominence of the ventricles and sulci reflects  parenchymal volume loss. Patchy T2 hyperintensity in the supratentorial white matter is nonspecific but may reflect minor chronic microvascular ischemic changes. No abnormal enhancement. Vascular: Major vessel flow voids at the skull base are preserved. Skull and upper cervical spine: Normal marrow signal is preserved. Sinuses/Orbits: Paranasal sinus mucosal thickening. Orbits are unremarkable. Other: Sella is unremarkable.  Mastoid air cells are clear. IMPRESSION: No evidence of intracranial metastatic disease. Electronically Signed   By: Macy Mis M.D.   On: 11/10/2021 14:37   CT ABDOMEN PELVIS W CONTRAST  Result Date: 11/11/2021 CLINICAL DATA:  Non-small cell lung cancer staging. EXAM: CT ABDOMEN AND PELVIS WITH CONTRAST TECHNIQUE: Multidetector CT imaging of the abdomen and pelvis was performed using the standard protocol following bolus administration of intravenous contrast. RADIATION DOSE REDUCTION: This exam was performed according to the departmental dose-optimization program which includes automated exposure control, adjustment of the mA and/or kV according to patient size and/or use of iterative reconstruction technique. CONTRAST:  6mL OMNIPAQUE IOHEXOL 300 MG/ML  SOLN COMPARISON:  Chest CTA 11/09/2021, abdominal MRI 06/22/2019 and abdominal ultrasound 01/02/2018. FINDINGS: Lower chest: Calcified pleural plaque, pleural thickening and linear scarring at both lung bases. The known left hilar mass is not imaged on this abdominal CT. No significant pleural or pericardial effusion. Aortic and coronary artery atherosclerosis are noted. Hepatobiliary: Numerous hepatic cysts are again noted, similar to previous MRI. No suspicious liver lesions are identified. There is no significant biliary dilatation status post cholecystectomy. Pancreas: Unremarkable. No pancreatic ductal dilatation or surrounding inflammatory changes. Spleen: Normal in size without focal abnormality. Adrenals/Urinary Tract: Both  adrenal glands appear normal. Small bilateral renal cysts. No evidence of renal mass, urinary tract calculus or hydronephrosis. The bladder appears unremarkable. Stomach/Bowel: Enteric contrast was administered and has passed into the rectum. The stomach appears unremarkable for its degree of distension. No evidence of bowel wall thickening, distention or surrounding inflammatory change. The appendix appears normal. Moderate stool throughout the colon. Vascular/Lymphatic: There are no enlarged abdominal or pelvic lymph nodes. Diffuse aortic and branch vessel atherosclerosis. No evidence of aneurysm or large vessel occlusion. Reproductive: Multiple  brachytherapy seeds are present within the prostate gland which otherwise appears unremarkable. Other: No ascites or peritoneal nodularity. Musculoskeletal: There is a lytic destructive mass within the right iliac bone with an associated soft tissue mass, highly suspicious for metastatic disease. This measures 3.7 x 3.2 cm on image 59/2. No other suspicious osseous findings are seen. There are degenerative changes throughout the lumbar spine which contribute to mild spinal stenosis and foraminal narrowing at the lower 4 disc space levels. IMPRESSION: 1. 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 As this patient also has a remote history of prostate cancer, correlation with serum PSA levels recommended, although there are no blastic lesions. 2. No other evidence of metastatic disease in the abdomen or pelvis. 3. Innumerable hepatic cysts, similar to previous MRI. Small renal cysts. 4. Multilevel lumbar spondylosis. 5. Aortic Atherosclerosis (ICD10-I70.0) and Emphysema (ICD10-J43.9). Electronically Signed   By: Richardean Sale M.D.   On: 11/11/2021 09:10   DG Chest Port 1 View  Result Date: 11/12/2021 CLINICAL DATA:  Status post left bronchoscopy EXAM: PORTABLE CHEST 1 VIEW COMPARISON:  Previous studies  including the examination of 11/09/2021 FINDINGS: Cardiac size is within normal limits. There is 7.5 cm homogeneous opacity in the left hilum and parahilar region with no significant interval change. There is no pneumothorax. Increased interstitial markings are seen in both lower lung fields which may be partly due to poor inspiration. There is minimal blunting of left lateral CP angle. There are scattered pleural calcifications. IMPRESSION: There is 7.5 cm opacity in the left parahilar region suggesting neoplastic process. Subtle increased markings in the lower lung fields may be due to poor inspiration. There is blunting of left lateral CP angle suggesting minimal effusion. Electronically Signed   By: Elmer Picker M.D.   On: 11/12/2021 15:37   DG C-Arm 1-60 Min-No Report  Result Date: 11/12/2021 Fluoroscopy was utilized by the requesting physician.  No radiographic interpretation.   ECHOCARDIOGRAM COMPLETE  Result Date: 11/10/2021    ECHOCARDIOGRAM REPORT   Patient Name:   Jared Mullen Date of Exam: 11/10/2021 Medical Rec #:  [id number]   Height:       69.0 in Accession #:    [id number]  Weight:       135.0 lb Date of Birth:  01/01/1941    BSA:          1.748 m Patient Age:    57 years    BP:           129/84 mmHg Patient Gender: M           HR:           78 bpm. Exam Location:  [facility] Procedure: 2D Echo, Color Doppler, Cardiac Doppler and Strain Analysis Indications:     R07.9 Chest Pain  History:         Patient has no prior history of Echocardiogram examinations.                  Risk Factors:Sleep Apnea.  Sonographer:     Charmayne Sheer Referring Phys:  [place] Diagnosing Phys: Ida Rogue MD  Sonographer Comments: Suboptimal parasternal window. Global longitudinal strain was attempted. IMPRESSIONS  1. Left ventricular ejection fraction, by estimation, is 55 to 60%. The left ventricle has normal function. The left ventricle has no regional wall motion abnormalities. Left ventricular  diastolic parameters are consistent with Grade I diastolic dysfunction (impaired relaxation). The  average left ventricular global longitudinal strain is -18.0 %. The global longitudinal strain is normal.  2. Right ventricular systolic function is normal. The right ventricular size is normal.  3. The mitral valve is normal in structure. No evidence of mitral valve regurgitation. No evidence of mitral stenosis.  4. The aortic valve was not well visualized. Aortic valve regurgitation is not visualized. No aortic stenosis is present.  5. The inferior vena cava is normal in size with greater than 50% respiratory variability, suggesting right atrial pressure of 3 mmHg. FINDINGS  Left Ventricle: Left ventricular ejection fraction, by estimation, is 55 to 60%. The left ventricle has normal function. The left ventricle has no regional wall motion abnormalities. The average left ventricular global longitudinal strain is -18.0 %. The global longitudinal strain is normal. The left ventricular internal cavity size was normal in size. There is no left ventricular hypertrophy. Left ventricular diastolic parameters are consistent with Grade I diastolic dysfunction (impaired relaxation). Right Ventricle: The right ventricular size is normal. No increase in right ventricular wall thickness. Right ventricular systolic function is normal. Left Atrium: Left atrial size was normal in size. Right Atrium: Right atrial size was normal in size. Pericardium: There is no evidence of pericardial effusion. Mitral Valve: The mitral valve is normal in structure. No evidence of mitral valve regurgitation. No evidence of mitral valve stenosis. MV peak gradient, 2.4 mmHg. The mean mitral valve gradient is 1.0 mmHg. Tricuspid Valve: The tricuspid valve is normal in structure. Tricuspid valve regurgitation is not demonstrated. No evidence of tricuspid stenosis. Aortic Valve: The aortic valve was not well visualized. Aortic valve regurgitation is not  visualized. No aortic stenosis is present. Aortic valve mean gradient measures 3.0 mmHg. Aortic valve peak gradient measures 5.4 mmHg. Aortic valve area, by VTI measures 2.93 cm. Pulmonic Valve: The pulmonic valve was normal in structure. Pulmonic valve regurgitation is not visualized. No evidence of pulmonic stenosis. Aorta: The aortic root is normal in size and structure. Venous: The inferior vena cava is normal in size with greater than 50% respiratory variability, suggesting right atrial pressure of 3 mmHg. IAS/Shunts: No atrial level shunt detected by color flow Doppler.  LEFT VENTRICLE PLAX 2D LVIDd:         4.03 cm   Diastology LVIDs:         2.25 cm   LV e' medial:    6.09 cm/s LV PW:         0.96 cm   LV E/e' medial:  11.1 LV IVS:        0.70 cm   LV e' lateral:   11.00 cm/s LVOT diam:     2.10 cm   LV E/e' lateral: 6.2 LV SV:         56 LV SV Index:   32        2D Longitudinal Strain LVOT Area:     3.46 cm  2D Strain GLS Avg:     -18.0 %  RIGHT VENTRICLE RV Basal diam:  3.68 cm LEFT ATRIUM             Index        RIGHT ATRIUM           Index LA diam:        2.70 cm 1.54 cm/m   RA Area:     13.10 cm LA Vol (A2C):   37.0 ml 21.16 ml/m  RA Volume:   31.20 ml  17.85 ml/m LA Vol (A4C):  25.1 ml 14.36 ml/m LA Biplane Vol: 31.8 ml 18.19 ml/m  AORTIC VALVE                    PULMONIC VALVE AV Area (Vmax):    2.77 cm     PV Vmax:       0.61 m/s AV Area (Vmean):   2.55 cm     PV Vmean:      41.500 cm/s AV Area (VTI):     2.93 cm     PV VTI:        0.105 m AV Vmax:           116.00 cm/s  PV Peak grad:  1.5 mmHg AV Vmean:          81.000 cm/s  PV Mean grad:  1.0 mmHg AV VTI:            0.190 m AV Peak Grad:      5.4 mmHg AV Mean Grad:      3.0 mmHg LVOT Vmax:         92.70 cm/s LVOT Vmean:        59.600 cm/s LVOT VTI:          0.161 m LVOT/AV VTI ratio: 0.85  AORTA Ao Root diam: 3.60 cm MITRAL VALVE MV Area (PHT): 3.97 cm    SHUNTS MV Area VTI:   2.77 cm    Systemic VTI:  0.16 m MV Peak grad:  2.4 mmHg     Systemic Diam: 2.10 cm MV Mean grad:  1.0 mmHg MV Vmax:       0.78 m/s MV Vmean:      55.6 cm/s MV Decel Time: 191 msec MV E velocity: 67.70 cm/s MV A velocity: 81.80 cm/s MV E/A ratio:  0.83 Ida Rogue MD Electronically signed by Ida Rogue MD Signature Date/Time: 11/10/2021/2:12:02 PM    Final     PERFORMANCE STATUS (ECOG) : 2 - Symptomatic, <50% confined to bed  Review of Systems Unless otherwise noted, a complete review of systems is negative.  Physical Exam General: NAD Pulmonary: Unlabored Abdomen: soft, nontender GU: no suprapubic tenderness Extremities: no edema, no joint deformities Skin: no rashes Neurological: Weakness but otherwise nonfocal  IMPRESSION: Follow-up visit.  Patient is pending discharge today but has not had a bowel movement in about a week.  Patient feels extremely constipated and attempted to manually disimpact himself yesterday with only being successful in removing 2 small round stools.  KUB yesterday showed nonobstructive bowel gas pattern.  Patient has benign abdominal exam.  Unfortunately, it does not appear that patient has consistently received a daily bowel regimen.  Last dose of lactulose was 2 days ago and he has not received any of the as needed Senokot.  Patient did receive a fleets enema yesterday without much success.  Discussed bowel regimen with patient, daughter, and RN.  We will repeat enema today and ask RN to manually assess for impaction and disimpact if needed.  We will also order lactulose and Dulcolax suppository.  Constipation is unlikely related to opioids as patient has not really been receiving pain medications.  Therefore, peripheral opioid receptor antagonists are unlikely to yield any benefit.  Pain is well controlled and patient denies other symptomatic concerns.  PLAN: -Liberalize bowel regimen: Enema, manual disimpaction, laxative suppositories, lactulose -Patient will need to have a bowel movement prior to  discharge -We will plan follow-up virtual visit later this week   Time Total: 25 minutes  Visit consisted of counseling and education dealing with the complex and emotionally intense issues of symptom management and palliative care in the setting of serious and potentially life-threatening illness.Greater than 50%  of this time was spent counseling and coordinating care related to the above assessment and plan.  Signed by: Altha Harm, PhD, NP-C

## 2021-11-16 NOTE — Progress Notes (Signed)
Pt was able to succesfully ambulated approxiamtely 43ft becoming nausea. He states that he feels a "rumble in his stomach". Patient is taken back into this room and toileted however no effectiveness.

## 2021-11-17 NOTE — TOC Transition Note (Signed)
Transition of Care [facility]) - CM/SW Discharge Note   Patient Details  Name: BELFORD PASCUCCI MRN: [id number] Date of Birth: 1941/09/18  [facility] CM/SW Contact:  Beverly Sessions, RN Phone Number: 11/17/2021, 10:03 AM   Clinical Narrative:     Confirmed with MD that patient will DC today.  Cory with [facility] notified   Final next level of care: [place] Barriers to Discharge: Barriers Resolved   Patient Goals and CMS Choice        Discharge Placement                       Discharge Plan and Services                DME Arranged: 3-N-1 DME Agency: [facility]       HH Arranged: PT, OT HH Agency: [place] Date [place]: 11/16/21 Time [place]: 1205 Representative spoke with at [place]: [place]) Interventions     Readmission Risk Interventions No flowsheet data found.

## 2021-11-18 ENCOUNTER — Inpatient Hospital Stay: Payer: Medicare Other | Attending: Hospice and Palliative Medicine | Admitting: Hospice and Palliative Medicine

## 2021-11-18 DIAGNOSIS — R5383 Other fatigue: Secondary | ICD-10-CM | POA: Insufficient documentation

## 2021-11-18 DIAGNOSIS — Z79899 Other long term (current) drug therapy: Secondary | ICD-10-CM | POA: Insufficient documentation

## 2021-11-18 DIAGNOSIS — I700 Atherosclerosis of aorta: Secondary | ICD-10-CM | POA: Insufficient documentation

## 2021-11-18 DIAGNOSIS — N486 Induration penis plastica: Secondary | ICD-10-CM | POA: Insufficient documentation

## 2021-11-18 DIAGNOSIS — Z87891 Personal history of nicotine dependence: Secondary | ICD-10-CM | POA: Insufficient documentation

## 2021-11-18 DIAGNOSIS — R918 Other nonspecific abnormal finding of lung field: Secondary | ICD-10-CM | POA: Diagnosis not present

## 2021-11-18 DIAGNOSIS — R110 Nausea: Secondary | ICD-10-CM | POA: Insufficient documentation

## 2021-11-18 DIAGNOSIS — R351 Nocturia: Secondary | ICD-10-CM | POA: Insufficient documentation

## 2021-11-18 DIAGNOSIS — R634 Abnormal weight loss: Secondary | ICD-10-CM | POA: Insufficient documentation

## 2021-11-18 DIAGNOSIS — G4730 Sleep apnea, unspecified: Secondary | ICD-10-CM | POA: Insufficient documentation

## 2021-11-18 DIAGNOSIS — C3412 Malignant neoplasm of upper lobe, left bronchus or lung: Secondary | ICD-10-CM | POA: Insufficient documentation

## 2021-11-18 DIAGNOSIS — J439 Emphysema, unspecified: Secondary | ICD-10-CM | POA: Insufficient documentation

## 2021-11-18 DIAGNOSIS — C61 Malignant neoplasm of prostate: Secondary | ICD-10-CM | POA: Insufficient documentation

## 2021-11-18 MED ORDER — MIRTAZAPINE 7.5 MG PO TABS: 7.5000 mg | ORAL_TABLET | Freq: Every day | ORAL | 2 refills | Status: DC

## 2021-11-18 MED ORDER — ONDANSETRON HCL 4 MG PO TABS: 4.0000 mg | ORAL_TABLET | Freq: Three times a day (TID) | ORAL | 0 refills | Status: DC | PRN

## 2021-11-18 MED ORDER — PROCHLORPERAZINE MALEATE 10 MG PO TABS: 10.0000 mg | ORAL_TABLET | Freq: Four times a day (QID) | ORAL | 0 refills | Status: DC | PRN

## 2021-11-18 NOTE — Progress Notes (Signed)
Virtual Visit via Telephone Note  I connected with [name] on 11/18/21 at  2:00 PM EST by telephone and verified that I am speaking with the correct person using two identifiers.  Location: Patient: Home Provider: Clinic   I discussed the limitations, risks, security and privacy concerns of performing an evaluation and management service by telephone and the availability of in person appointments. I also discussed with the patient that there may be a patient responsible charge related to this service. The patient expressed understanding and agreed to proceed.   History of Present Illness: Jared Mullen is a 81 y.o. male with multiple medical problems including with multiple medical problems including history of prostate cancer, sleep apnea, admitted to the hospital 11/10/2021 for evaluation of progressive weakness and weight loss.  CTA of the chest revealed a left upper lobe lung mass.  CT of the abdomen and pelvis revealed a lytic lesion to the right iliac crest.  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  Palliative care was consulted to help address goals.    Observations/Objective: I called to speak with patient/daughter.  Reportedly, since discharging home patient has been doing reasonably well.  He had significant constipation prior to discharge from the hospital but this has resolved.  We discussed his home bowel regimen in detail including use of MiraLAX/senna once or twice daily with a target of having a bowel movement once a day or once every other day.    He does have occasional nausea and would like to have antiemetics available for use as needed.  Patient denies vomiting.  Appetite is reportedly poor.  Would recommend adding oral nutritional supplements and focusing on high-protein/high-calorie foods.  Daughter is concerned that patient is crying often given the recent loss of his wife.  I had discussed starting him on an antidepressant in the  hospital and patient was reluctant at that time but is now agreeable.  We will start on mirtazapine as this may also help appetite and sleep.  We will also send referral to [name], LCSW.  I also strongly encouraged grief counseling through [facility].  Assessment and Plan: Stage IV NSCLC -patient has follow-up visit next week with Dr. Janese Banks to discuss treatment options  Neoplasm related pain - Patient is fearful of taking opioid pain medications due to constipation.  He would like to stick with acetaminophen for now.  Nausea -we will start Zofran/Compazine as needed.  Referral for nutrition  Depression -start mirtazapine 7.5 mg nightly.  Referral to LCSW.  Recommend grief counseling.  Follow Up Instructions: RTC next week   I discussed the assessment and treatment plan with the patient. The patient was provided an opportunity to ask questions and all were answered. The patient agreed with the plan and demonstrated an understanding of the instructions.   The patient was advised to call back or seek an in-person evaluation if the symptoms worsen or if the condition fails to improve as anticipated.  I provided 10 minutes of non-face-to-face time during this encounter.   [name], NP

## 2021-11-19 ENCOUNTER — Other Ambulatory Visit: Payer: Medicare Other

## 2021-11-19 ENCOUNTER — Telehealth: Payer: Self-pay | Admitting: *Deleted

## 2021-11-19 MED ORDER — PROCHLORPERAZINE MALEATE 10 MG PO TABS: 10.0000 mg | ORAL_TABLET | Freq: Four times a day (QID) | ORAL | 0 refills | Status: DC | PRN

## 2021-11-19 NOTE — Progress Notes (Signed)
Tumor Board Documentation  Jared Mullen was presented by Dr Janese Banks at our Tumor Board on 11/19/2021, which included representatives from medical oncology, radiation oncology, radiology, pathology, pulmonology, pharmacy, genetics, research, palliative care, internal medicine, navigation, surgical.  Jared Mullen currently presents as a new patient, for Jared Mullen, for new positive pathology with history of the following treatments: surgical intervention(s).  Additionally, we reviewed previous medical and familial history, history of present illness, and recent lab results along with all available histopathologic and imaging studies. The tumor board considered available treatment options and made the following recommendations: Additional screening, Immunotherapy, Chemotherapy (NGS testing)    The following procedures/referrals were also placed: No orders of the defined types were placed in this encounter.   Clinical Trial Status: not discussed   Staging used: AJCC Stage Group AJCC Staging: T: 3 N: 1 M: 1b Group: Stae IV Adenocarcinoma of LUL Lung   National site-specific guidelines NCCN were discussed with respect to the case.  Tumor board is a meeting of clinicians from various specialty areas who evaluate and discuss patients for whom a multidisciplinary approach is being considered. Final determinations in the plan of care are those of the provider(s). The responsibility for follow up of recommendations given during tumor board is that of the provider.   Todays extended care, comprehensive team conference, Jared Mullen was not present for the discussion and was not examined.   Multidisciplinary Tumor Board is a multidisciplinary case peer review process.  Decisions discussed in the Multidisciplinary Tumor Board reflect the opinions of the specialists present at the conference without having examined the patient.  Ultimately, treatment and diagnostic decisions rest with the primary provider(s) and the patient.

## 2021-11-19 NOTE — Telephone Encounter (Signed)
Pharmacy requesting diagnosis code for Compazine to be added to prescription before they can fill it per his insurance

## 2021-11-20 ENCOUNTER — Encounter: Payer: Self-pay | Admitting: Licensed Clinical Social Worker

## 2021-11-20 NOTE — Progress Notes (Signed)
[place] Work  Clinical Social Work was referred by [employer] for assessment of psychosocial needs.  Clinical Social Worker  contacted patient and caregiver [name] [phone], daughter   to offer support and assess for needs. CSW left voicemail with contact information and request for return call.      Jared Mullen, Jared Mullen       First Attempt

## 2021-11-23 ENCOUNTER — Encounter: Payer: Self-pay | Admitting: Licensed Clinical Social Worker

## 2021-11-23 ENCOUNTER — Inpatient Hospital Stay (HOSPITAL_BASED_OUTPATIENT_CLINIC_OR_DEPARTMENT_OTHER): Payer: Medicare Other | Admitting: Oncology

## 2021-11-23 ENCOUNTER — Inpatient Hospital Stay (HOSPITAL_BASED_OUTPATIENT_CLINIC_OR_DEPARTMENT_OTHER): Payer: Medicare Other | Admitting: Hospice and Palliative Medicine

## 2021-11-23 ENCOUNTER — Encounter: Payer: Self-pay | Admitting: *Deleted

## 2021-11-23 ENCOUNTER — Other Ambulatory Visit: Payer: Self-pay | Admitting: Hospice and Palliative Medicine

## 2021-11-23 ENCOUNTER — Encounter: Payer: Self-pay | Admitting: Oncology

## 2021-11-23 ENCOUNTER — Other Ambulatory Visit: Payer: Self-pay

## 2021-11-23 VITALS — BP 113/78 | HR 107 | Temp 98.2°F | Resp 18 | Ht 69.0 in | Wt 153.5 lb

## 2021-11-23 DIAGNOSIS — R634 Abnormal weight loss: Secondary | ICD-10-CM | POA: Diagnosis not present

## 2021-11-23 DIAGNOSIS — Z515 Encounter for palliative care: Secondary | ICD-10-CM

## 2021-11-23 DIAGNOSIS — R918 Other nonspecific abnormal finding of lung field: Secondary | ICD-10-CM

## 2021-11-23 DIAGNOSIS — Z7189 Other specified counseling: Secondary | ICD-10-CM

## 2021-11-23 DIAGNOSIS — R5383 Other fatigue: Secondary | ICD-10-CM | POA: Diagnosis not present

## 2021-11-23 DIAGNOSIS — G893 Neoplasm related pain (acute) (chronic): Secondary | ICD-10-CM

## 2021-11-23 DIAGNOSIS — R110 Nausea: Secondary | ICD-10-CM | POA: Diagnosis not present

## 2021-11-23 DIAGNOSIS — J439 Emphysema, unspecified: Secondary | ICD-10-CM | POA: Diagnosis not present

## 2021-11-23 DIAGNOSIS — C3412 Malignant neoplasm of upper lobe, left bronchus or lung: Secondary | ICD-10-CM

## 2021-11-23 DIAGNOSIS — Z87891 Personal history of nicotine dependence: Secondary | ICD-10-CM | POA: Diagnosis not present

## 2021-11-23 DIAGNOSIS — G4730 Sleep apnea, unspecified: Secondary | ICD-10-CM | POA: Diagnosis not present

## 2021-11-23 DIAGNOSIS — N486 Induration penis plastica: Secondary | ICD-10-CM | POA: Diagnosis not present

## 2021-11-23 DIAGNOSIS — R351 Nocturia: Secondary | ICD-10-CM | POA: Diagnosis not present

## 2021-11-23 DIAGNOSIS — I700 Atherosclerosis of aorta: Secondary | ICD-10-CM | POA: Diagnosis not present

## 2021-11-23 DIAGNOSIS — C61 Malignant neoplasm of prostate: Secondary | ICD-10-CM | POA: Diagnosis not present

## 2021-11-23 DIAGNOSIS — Z79899 Other long term (current) drug therapy: Secondary | ICD-10-CM | POA: Diagnosis not present

## 2021-11-23 MED ORDER — TRAMADOL HCL 50 MG PO TABS: 25.0000 mg | ORAL_TABLET | Freq: Four times a day (QID) | ORAL | 0 refills | Status: DC | PRN

## 2021-11-23 MED ORDER — PROCHLORPERAZINE MALEATE 10 MG PO TABS: 10.0000 mg | ORAL_TABLET | Freq: Four times a day (QID) | ORAL | 0 refills | Status: DC | PRN

## 2021-11-23 NOTE — Progress Notes (Signed)
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  CT chest showed left perihilar upper lobe masslike opacity measuring 6.6 x 5 x 6.1 cm contiguous with the hilum.  Left hilar adenopathy as well as paratracheal adenopathy.  Severe narrowing of the left upper lobe bronchus due to left perihilar mass.  CT abdomen and pelvis with contrast showed large lytic lesion involving the right iliac bone.  MRI brain with and without contrast did not show any evidence of metastatic disease.  Patient underwent bronchoscopy with biopsy of the left hilar mass which was consistent with adenocarcinoma.  NGS testing and PD-L1 testing is currently pending  Interval history-patient was discharged home and is overall doing poorly.  He reports ongoing fatigue.  Also has on and off dry heaves.  He has persistent right sided chest wall pain.  He was prescribed narcotics but was fearful of taking the medication due to side effects and concern for constipation  ECOG PS- 2 Pain scale- 5 Opioid associated constipation- no  Review of systems- Review of Systems  Constitutional:  Positive for malaise/fatigue. Negative for chills, fever and weight loss.  HENT:  Negative for congestion, ear discharge and nosebleeds.   Eyes:   Negative for blurred vision.  Respiratory:  Negative for cough, hemoptysis, sputum production, shortness of breath and wheezing.        Chest wall pain  Cardiovascular:  Negative for chest pain, palpitations, orthopnea and claudication.  Gastrointestinal:  Positive for nausea. Negative for abdominal pain, blood in stool, constipation, diarrhea, heartburn, melena and vomiting.  Genitourinary:  Negative for dysuria, flank pain, frequency, hematuria and urgency.  Musculoskeletal:  Negative for back pain, joint pain and myalgias.  Skin:  Negative for rash.  Neurological:  Negative for dizziness, tingling, focal weakness, seizures, weakness and headaches.  Endo/Heme/Allergies:  Does not bruise/bleed easily.  Psychiatric/Behavioral:  Negative for depression and suicidal ideas. The patient does not have insomnia.      No Known Allergies   Past Medical History:  Diagnosis Date   Abnormal ECG    a. baseline inferolateral ST elevation dating back to at least 2012.   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   Sleep apnea    Urinary frequency    Weak urinary stream    Weight loss      Past Surgical History:  Procedure Laterality Date   biospy  09/30/2011-   TRUS/Bx Prostate - 5/12/biopsies positive for cancer, glandular size 32.5 cc   CHOLECYSTECTOMY  1992   RADIOACTIVE SEED IMPLANT  02/09/2012   Procedure: RADIOACTIVE SEED IMPLANT;  Surgeon: Franchot Gallo, MD;  Location: [facility];  Service: Urology;  Laterality: N/A;  RAD  TECH OK PER ANNE AT MAIN OR    UVULOPALATOPHARYNGOPLASTY     VIDEO BRONCHOSCOPY WITH ENDOBRONCHIAL ULTRASOUND Bilateral 11/12/2021   Procedure: VIDEO BRONCHOSCOPY WITH ENDOBRONCHIAL ULTRASOUND;  Surgeon: Ottie Glazier, MD;  Location: [facility];  Service: Thoracic;  Laterality: Bilateral;    Social History    Socioeconomic History   Marital status: Widowed    Spouse name: Not on file   Number of children: 3   Years of education: Not on file   Highest education level: High school graduate  Occupational History   Occupation: retired  Tobacco Use   Smoking status: Former    Packs/day: 2.00    Years: 28.00    Pack years: 56.00    Types: Cigarettes    Quit date: 10/18/1980    Years since quitting: 41.1   Smokeless tobacco: Never  Substance and Sexual Activity   Alcohol use: No   Drug use: No   Sexual activity: Not on file    Comment: Low libido  Other Topics Concern   Not on file  Social History Narrative   Lives locally.  Was caring for his wife until she passed in December.  Sedentary.   Social Determinants of Health   Financial Resource Strain: Not on file  Food Insecurity: Not on file  Transportation Needs: Not on file  Physical Activity: Not on file  Stress: Not on file  Social Connections: Not on file  Intimate Partner Violence: Not on file    Family History  Problem Relation Age of Onset   Heart disease Father    Breast cancer Sister    Cancer Sister        lung   Lung cancer Brother    Cancer Brother        bladder   Lung cancer Sister    Cancer Sister        breast   Kidney disease Brother      Current Outpatient Medications:    acetaminophen (TYLENOL) 325 MG tablet, Take 650 mg by mouth every 6 (six) hours as needed., Disp: , Rfl:    mirtazapine (REMERON) 7.5 MG tablet, Take 1 tablet (7.5 mg total) by mouth at bedtime., Disp: 30 tablet, Rfl: 2   ondansetron (ZOFRAN) 4 MG tablet, Take 1 tablet (4 mg total) by mouth every 8 (eight) hours as needed for nausea or vomiting., Disp: 20 tablet, Rfl: 0   atorvastatin (LIPITOR) 80 MG tablet, Take 1 tablet (80 mg total) by mouth daily. (Patient not taking: Reported on 11/23/2021), Disp: 30 tablet, Rfl: 0   benzonatate (TESSALON) 100 MG capsule, Take 2 capsules (200 mg total) by mouth every 8 (eight) hours. (Patient not  taking: Reported on 11/23/2021), Disp: 21 capsule, Rfl: 0   ipratropium (ATROVENT) 0.06 % nasal spray, Place 2 sprays into both nostrils 4 (four) times daily. (Patient not taking: Reported on 11/23/2021), Disp: 15 mL, Rfl: 12   metoprolol tartrate (LOPRESSOR) 25 MG tablet, Take 0.5 tablets (12.5 mg total) by mouth 2 (two) times daily. (Patient not taking: Reported on 11/23/2021), Disp: 30 tablet, Rfl: 0   prochlorperazine (COMPAZINE) 10 MG tablet, Take 1 tablet (10 mg total) by mouth every 6 (six) hours as needed for nausea or vomiting., Disp: 30 tablet, Rfl: 0   traMADol (ULTRAM) 50 MG tablet, Take 0.5-1 tablets (25-50 mg total) by mouth every 6 (six) hours as needed., Disp: 30 tablet, Rfl: 0  Physical exam:  Vitals:   11/23/21 1400 11/23/21 1431  BP: 113/78  Pulse: (!) 107   Resp: 16 18  Temp: 98.2 F (36.8 C)   TempSrc: Tympanic   SpO2: 96%   Weight: 153 lb 8 oz (69.6 kg)   Height: 5' 9"  (1.753 m) 5' 9"  (1.753 m)   Physical Exam Constitutional:      Comments: Sitting in a wheelchair.  Appears fatigued  Cardiovascular:     Rate and Rhythm: Regular rhythm. Tachycardia present.     Heart sounds: Normal heart sounds.  Pulmonary:     Effort: Pulmonary effort is normal.     Breath sounds: Normal breath sounds.  Abdominal:     General: Bowel sounds are normal.     Palpations: Abdomen is soft.  Skin:    General: Skin is warm and dry.  Neurological:     Mental Status: He is alert and oriented to person, place, and time.     CMP Latest Ref Rng & Units 11/16/2021  Glucose 70 - 99 mg/dL 105(H)  BUN 8 - 23 mg/dL 14  Creatinine 0.61 - 1.24 mg/dL 0.87  Sodium 135 - 145 mmol/L 136  Potassium 3.5 - 5.1 mmol/L 4.5  Chloride 98 - 111 mmol/L 99  CO2 22 - 32 mmol/L 30  Calcium 8.9 - 10.3 mg/dL 8.9  Total Protein 6.5 - 8.1 g/dL -  Total Bilirubin 0.3 - 1.2 mg/dL -  Alkaline Phos 38 - 126 U/L -  AST 15 - 41 U/L -  ALT 0 - 44 U/L -   CBC Latest Ref Rng & Units 11/16/2021  WBC 4.0 - 10.5 K/uL  8.6  Hemoglobin 13.0 - 17.0 g/dL 13.2  Hematocrit 39.0 - 52.0 % 40.2  Platelets 150 - 400 K/uL 231    No images are attached to the encounter.  DG Chest 2 View  Result Date: 11/09/2021 CLINICAL DATA:  Nausea, belching decreased appetite for 2 months, history prostate cancer EXAM: CHEST - 2 VIEW COMPARISON:  03/07/2017 rib radiographs, chest radiograph 11/16/2016 FINDINGS: Normal heart size, mediastinal contours, and pulmonary vascularity. Small calcifications in the chest bilaterally again identified likely calcified pleural plaques. New LEFT perihilar mass 6.6 x 6.4 cm highly suspicious for pulmonary neoplasm. No acute infiltrate, pleural effusion or pneumothorax. No definite acute bone lesions. IMPRESSION: New LEFT perihilar mass 6.6 x 6.4 cm highly suspicious for a pulmonary neoplasm; CT chest with contrast recommended for further evaluation. Calcifications in the chest bilaterally most consistent with calcified pleural plaques, unchanged, question prior asbestos exposure. Aortic Atherosclerosis (ICD10-I70.0). Electronically Signed   By: Lavonia Dana M.D.   On: 11/09/2021 17:09   DG Abd 1 View  Result Date: 11/15/2021 CLINICAL DATA:  Abdominal pain and distention. Metastatic lung carcinoma. EXAM: ABDOMEN - 1 VIEW COMPARISON:  03/08/2017 FINDINGS: Gas-filled nondilated small bowel loops are seen. Moderate stool also noted, predominantly in the right colon. Right upper quadrant surgical clips again noted from prior cholecystectomy. Brachytherapy seeds seen in the prostate bed. IMPRESSION: Nonobstructive bowel gas pattern.  No acute findings. Electronically Signed   By: Marlaine Hind M.D.   On: 11/15/2021 13:09   CT Angio Chest PE W/Cm &/Or Wo Cm  Result Date: 11/09/2021 CLINICAL DATA:  Pulmonary embolism (PE) suspected, high prob Two month history of nausea and decreased appetite. Symptoms after patient's wife done 2 months ago. EXAM: CT ANGIOGRAPHY CHEST WITH CONTRAST TECHNIQUE: Multidetector CT  imaging of the chest was performed using the standard protocol during bolus administration of intravenous contrast. Multiplanar CT image reconstructions and MIPs were obtained to evaluate  the vascular anatomy. RADIATION DOSE REDUCTION: This exam was performed according to the departmental dose-optimization program which includes automated exposure control, adjustment of the mA and/or kV according to patient size and/or use of iterative reconstruction technique. CONTRAST:  65m OMNIPAQUE IOHEXOL 350 MG/ML SOLN COMPARISON:  Chest radiograph earlier today. FINDINGS: Cardiovascular: There are no filling defects within the pulmonary arteries to suggest pulmonary embolus. The lingular pulmonary arteries are attenuated due to left perihilar mass. Atherosclerosis of the thoracic aorta. No aortic aneurysm or acute aortic findings. The heart is normal in size. There is no pericardial effusion. Mediastinum/Nodes: Left upper lobe perihilar mass is contiguous with the hilum. There is likely separate suprahilar adenopathy within 18 mm lymph node series 5, image 46. Likely infrahilar lymph node measuring 2.4 cm series 5, image 58. 9 mm right hilar node. Occasional prominent left lower paratracheal nodes, including a 10 mm node series 5, image 38. No definite supraclavicular adenopathy. No visualized thyroid nodule. Upper esophagus is patulous. No esophageal wall thickening. Lungs/Pleura: Left perihilar upper lobe masslike opacity. This measures 6.6 x 5.0 x 6.1 cm, measured on series 7, image 46 and series 8, image 55. Margins are irregular and spiculated with linear extension to the periphery of the pleural surface of the left upper lobe. There are small adjacent satellite nodules. Separate left upper lobe 3 mm nodule series 7, image 32. Irregular opacity at the left lung apex which appears triangular measuring 11 mm, series 7, image 21. Biapical pleuroparenchymal scarring, some of which is calcified. Bilateral calcified pleural  plaques. Multiple calcified granuloma in the right lower lobe. Severe narrowing of the left upper lobe bronchus due to in left perihilar mass. No pleural effusion. Upper Abdomen: Innumerable low-density lesions within the liver, largest lesions appear to be simple fluid density. Previous abdominal MRI 06/22/2019 demonstrated multiple hepatic cysts. No discrete adrenal nodule. Musculoskeletal: Slight flattening of mid thoracic vertebra. No destructive lytic or focal blastic osseous lesion. Review of the MIP images confirms the above findings. IMPRESSION: 1. No pulmonary embolus. 2. Left perihilar upper lobe masslike opacity measuring 6.6 x 5.0 x 6.1 cm, contiguous with the hilum. Margins are irregular and spiculated with linear extension to the periphery of the left upper lobe. Findings are highly suspicious for primary bronchogenic malignancy. 3. Perihilar mass is contiguous with the hilum, however there appears to be a discrete left hilar adenopathy. Prominent left lower paratracheal nodes, and a nonspecific 9 mm right hilar node. 4. Separate 3 mm left upper lobe nodule. Irregular opacity at the left lung apex appears triangular measuring 11 mm, nonspecific but may be scarring. 5. Severe narrowing of the left upper lobe bronchus due to left perihilar mass. 6. Innumerable low-density lesions within the liver, largest lesions appear to be simple fluid density. These were previously characterized as hepatic cysts on MRI, although not well assessed on the current exam due to phase of contrast. 7. 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 Aortic Atherosclerosis (ICD10-I70.0). Electronically Signed   By: MKeith RakeM.D.   On: 11/09/2021 18:02   MR BRAIN W WO CONTRAST  Result Date: 11/10/2021 CLINICAL DATA:  Metastatic disease evaluation, lung mass EXAM: MRI HEAD WITHOUT AND WITH CONTRAST TECHNIQUE: Multiplanar, multiecho pulse sequences of the brain and surrounding structures were  obtained without and with intravenous contrast. CONTRAST:  723mGADAVIST GADOBUTROL 1 MMOL/ML IV SOLN COMPARISON:  None. FINDINGS: Brain: There is no acute infarction or intracranial hemorrhage. There is no intracranial mass, mass effect, or edema. There  is no hydrocephalus or extra-axial fluid collection. Prominence of the ventricles and sulci reflects parenchymal volume loss. Patchy T2 hyperintensity in the supratentorial white matter is nonspecific but may reflect minor chronic microvascular ischemic changes. No abnormal enhancement. Vascular: Major vessel flow voids at the skull base are preserved. Skull and upper cervical spine: Normal marrow signal is preserved. Sinuses/Orbits: Paranasal sinus mucosal thickening. Orbits are unremarkable. Other: Sella is unremarkable.  Mastoid air cells are clear. IMPRESSION: No evidence of intracranial metastatic disease. Electronically Signed   By: Macy Mis M.D.   On: 11/10/2021 14:37   CT ABDOMEN PELVIS W CONTRAST  Result Date: 11/11/2021 CLINICAL DATA:  Non-small cell lung cancer staging. EXAM: CT ABDOMEN AND PELVIS WITH CONTRAST TECHNIQUE: Multidetector CT imaging of the abdomen and pelvis was performed using the standard protocol following bolus administration of intravenous contrast. RADIATION DOSE REDUCTION: This exam was performed according to the departmental dose-optimization program which includes automated exposure control, adjustment of the mA and/or kV according to patient size and/or use of iterative reconstruction technique. CONTRAST:  53m OMNIPAQUE IOHEXOL 300 MG/ML  SOLN COMPARISON:  Chest CTA 11/09/2021, abdominal MRI 06/22/2019 and abdominal ultrasound 01/02/2018. FINDINGS: Lower chest: Calcified pleural plaque, pleural thickening and linear scarring at both lung bases. The known left hilar mass is not imaged on this abdominal CT. No significant pleural or pericardial effusion. Aortic and coronary artery atherosclerosis are noted. Hepatobiliary:  Numerous hepatic cysts are again noted, similar to previous MRI. No suspicious liver lesions are identified. There is no significant biliary dilatation status post cholecystectomy. Pancreas: Unremarkable. No pancreatic ductal dilatation or surrounding inflammatory changes. Spleen: Normal in size without focal abnormality. Adrenals/Urinary Tract: Both adrenal glands appear normal. Small bilateral renal cysts. No evidence of renal mass, urinary tract calculus or hydronephrosis. The bladder appears unremarkable. Stomach/Bowel: Enteric contrast was administered and has passed into the rectum. The stomach appears unremarkable for its degree of distension. No evidence of bowel wall thickening, distention or surrounding inflammatory change. The appendix appears normal. Moderate stool throughout the colon. Vascular/Lymphatic: There are no enlarged abdominal or pelvic lymph nodes. Diffuse aortic and branch vessel atherosclerosis. No evidence of aneurysm or large vessel occlusion. Reproductive: Multiple brachytherapy seeds are present within the prostate gland which otherwise appears unremarkable. Other: No ascites or peritoneal nodularity. Musculoskeletal: There is a lytic destructive mass within the right iliac bone with an associated soft tissue mass, highly suspicious for metastatic disease. This measures 3.7 x 3.2 cm on image 59/2. No other suspicious osseous findings are seen. There are degenerative changes throughout the lumbar spine which contribute to mild spinal stenosis and foraminal narrowing at the lower 4 disc space levels. IMPRESSION: 1. 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 As this patient also has a remote history of prostate cancer, correlation with serum PSA levels recommended, although there are no blastic lesions. 2. No other evidence of metastatic disease in the abdomen or pelvis. 3. Innumerable hepatic cysts, similar to previous  MRI. Small renal cysts. 4. Multilevel lumbar spondylosis. 5. Aortic Atherosclerosis (ICD10-I70.0) and Emphysema (ICD10-J43.9). Electronically Signed   By: WRichardean SaleM.D.   On: 11/11/2021 09:10   DG Chest Port 1 View  Result Date: 11/12/2021 CLINICAL DATA:  Status post left bronchoscopy EXAM: PORTABLE CHEST 1 VIEW COMPARISON:  Previous studies including the examination of 11/09/2021 FINDINGS: Cardiac size is within normal limits. There is 7.5 cm homogeneous opacity in the left hilum and parahilar region with no  significant interval change. There is no pneumothorax. Increased interstitial markings are seen in both lower lung fields which may be partly due to poor inspiration. There is minimal blunting of left lateral CP angle. There are scattered pleural calcifications. IMPRESSION: There is 7.5 cm opacity in the left parahilar region suggesting neoplastic process. Subtle increased markings in the lower lung fields may be due to poor inspiration. There is blunting of left lateral CP angle suggesting minimal effusion. Electronically Signed   By: Elmer Picker M.D.   On: 11/12/2021 15:37   DG C-Arm 1-60 Min-No Report  Result Date: 11/12/2021 Fluoroscopy was utilized by the requesting physician.  No radiographic interpretation.   ECHOCARDIOGRAM COMPLETE  Result Date: 11/10/2021    ECHOCARDIOGRAM REPORT   Patient Name:   STEPHANOS FAN Date of Exam: 11/10/2021 Medical Rec #:  [id number]   Height:       69.0 in Accession #:    [id number]  Weight:       135.0 lb Date of Birth:  February 15, 1941    BSA:          1.748 m Patient Age:    38 years    BP:           129/84 mmHg Patient Gender: M           HR:           78 bpm. Exam Location:  [facility] Procedure: 2D Echo, Color Doppler, Cardiac Doppler and Strain Analysis Indications:     R07.9 Chest Pain  History:         Patient has no prior history of Echocardiogram examinations.                  Risk Factors:Sleep Apnea.  Sonographer:     Charmayne Sheer Referring Phys:   [place] Diagnosing Phys: Ida Rogue MD  Sonographer Comments: Suboptimal parasternal window. Global longitudinal strain was attempted. IMPRESSIONS  1. Left ventricular ejection fraction, by estimation, is 55 to 60%. The left ventricle has normal function. The left ventricle has no regional wall motion abnormalities. Left ventricular diastolic parameters are consistent with Grade I diastolic dysfunction (impaired relaxation). The average left ventricular global longitudinal strain is -18.0 %. The global longitudinal strain is normal.  2. Right ventricular systolic function is normal. The right ventricular size is normal.  3. The mitral valve is normal in structure. No evidence of mitral valve regurgitation. No evidence of mitral stenosis.  4. The aortic valve was not well visualized. Aortic valve regurgitation is not visualized. No aortic stenosis is present.  5. The inferior vena cava is normal in size with greater than 50% respiratory variability, suggesting right atrial pressure of 3 mmHg. FINDINGS  Left Ventricle: Left ventricular ejection fraction, by estimation, is 55 to 60%. The left ventricle has normal function. The left ventricle has no regional wall motion abnormalities. The average left ventricular global longitudinal strain is -18.0 %. The global longitudinal strain is normal. The left ventricular internal cavity size was normal in size. There is no left ventricular hypertrophy. Left ventricular diastolic parameters are consistent with Grade I diastolic dysfunction (impaired relaxation). Right Ventricle: The right ventricular size is normal. No increase in right ventricular wall thickness. Right ventricular systolic function is normal. Left Atrium: Left atrial size was normal in size. Right Atrium: Right atrial size was normal in size. Pericardium: There is no evidence of pericardial effusion. Mitral Valve: The mitral valve is normal in structure. No evidence of  mitral valve  regurgitation. No evidence of mitral valve stenosis. MV peak gradient, 2.4 mmHg. The mean mitral valve gradient is 1.0 mmHg. Tricuspid Valve: The tricuspid valve is normal in structure. Tricuspid valve regurgitation is not demonstrated. No evidence of tricuspid stenosis. Aortic Valve: The aortic valve was not well visualized. Aortic valve regurgitation is not visualized. No aortic stenosis is present. Aortic valve mean gradient measures 3.0 mmHg. Aortic valve peak gradient measures 5.4 mmHg. Aortic valve area, by VTI measures 2.93 cm. Pulmonic Valve: The pulmonic valve was normal in structure. Pulmonic valve regurgitation is not visualized. No evidence of pulmonic stenosis. Aorta: The aortic root is normal in size and structure. Venous: The inferior vena cava is normal in size with greater than 50% respiratory variability, suggesting right atrial pressure of 3 mmHg. IAS/Shunts: No atrial level shunt detected by color flow Doppler.  LEFT VENTRICLE PLAX 2D LVIDd:         4.03 cm   Diastology LVIDs:         2.25 cm   LV e' medial:    6.09 cm/s LV PW:         0.96 cm   LV E/e' medial:  11.1 LV IVS:        0.70 cm   LV e' lateral:   11.00 cm/s LVOT diam:     2.10 cm   LV E/e' lateral: 6.2 LV SV:         56 LV SV Index:   32        2D Longitudinal Strain LVOT Area:     3.46 cm  2D Strain GLS Avg:     -18.0 %  RIGHT VENTRICLE RV Basal diam:  3.68 cm LEFT ATRIUM             Index        RIGHT ATRIUM           Index LA diam:        2.70 cm 1.54 cm/m   RA Area:     13.10 cm LA Vol (A2C):   37.0 ml 21.16 ml/m  RA Volume:   31.20 ml  17.85 ml/m LA Vol (A4C):   25.1 ml 14.36 ml/m LA Biplane Vol: 31.8 ml 18.19 ml/m  AORTIC VALVE                    PULMONIC VALVE AV Area (Vmax):    2.77 cm     PV Vmax:       0.61 m/s AV Area (Vmean):   2.55 cm     PV Vmean:      41.500 cm/s AV Area (VTI):     2.93 cm     PV VTI:        0.105 m AV Vmax:           116.00 cm/s  PV Peak grad:  1.5 mmHg AV Vmean:          81.000 cm/s  PV  Mean grad:  1.0 mmHg AV VTI:            0.190 m AV Peak Grad:      5.4 mmHg AV Mean Grad:      3.0 mmHg LVOT Vmax:         92.70 cm/s LVOT Vmean:        59.600 cm/s LVOT VTI:          0.161 m LVOT/AV VTI ratio: 0.85  AORTA Ao Root diam:  3.60 cm MITRAL VALVE MV Area (PHT): 3.97 cm    SHUNTS MV Area VTI:   2.77 cm    Systemic VTI:  0.16 m MV Peak grad:  2.4 mmHg    Systemic Diam: 2.10 cm MV Mean grad:  1.0 mmHg MV Vmax:       0.78 m/s MV Vmean:      55.6 cm/s MV Decel Time: 191 msec MV E velocity: 67.70 cm/s MV A velocity: 81.80 cm/s MV E/A ratio:  0.83 Ida Rogue MD Electronically signed by Ida Rogue MD Signature Date/Time: 11/10/2021/2:12:02 PM    Final      Assessment and plan- Patient is a 81 y.o. male with newly diagnosed stage IV adenocarcinoma of the lung with right iliac bone metastases  Overall patient is doing poorly as evidenced by fatigue, ongoing nauseaAs well as chest wall pain.  He is meeting palliative care today to address his symptoms as well.  Discussed differences between non-small cell and small cell lung cancer.  Discussed that he has stage IV adenocarcinoma of the lung which would be treated with systemic therapy in the absence of actionable mutations.  At this time we are awaiting NGS testing to see if there are any actionable mutations which would enable [place] to use oral medications to treat lung cancer.  If he does not have any actionable mutations-I would favor single agent immunotherapy if PD-L1 expression is good on his tumor.  Patient is unlikely to tolerate combination chemoimmunotherapy at this time.  Without treatment patient's prognosis is less than 6 months.  With treatment we could be looking at 1 to 1.5 years if he responds to treatment.  Patient does not have any significant shortness of breath and therefore I will hold off on any palliative radiation to his lung mass and his bone lesion at this time.  If we decide to pursue treatment I will discuss  bisphosphonates at my next visit.  Neoplasm related pain: Patient is fearful of opioids.  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 Goals of care, counseling/discussion      Dr. Randa Evens, MD, MPH [facility] [place] [facility] [phone] 11/23/2021 4:26 PM

## 2021-11-23 NOTE — Progress Notes (Signed)
**Note Jared-Identified via Obfuscation** [place] [facility] [phone] [phone]   Name: Jared Mullen Date: 11/23/2021 MRN: [id number]  DOB: Sep 05, 1941  Patient Care Team: Birdie Sons, MD as PCP - General (Family Medicine) Rockey Situ, Kathlene November, MD as PCP - Cardiology (Cardiology) Franchot Gallo, MD as Consulting Physician (Urology) Telford Nab, RN as Oncology Nurse Navigator    REASON FOR CONSULTATION: Jared Mullen is a 81 y.o. male with multiple medical problems including with multiple medical problems including with multiple medical problems including history of prostate cancer, sleep apnea, admitted to the hospital 11/10/2021 for evaluation of progressive weakness and weight loss.  CTA of the chest revealed a left upper lobe lung mass.  CT of the abdomen and pelvis revealed a lytic lesion to the right iliac crest.  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  Palliative care was consulted to help address goals. .   SOCIAL HISTORY:     reports that he quit smoking about 41 years ago. His smoking use included cigarettes. He has a 56.00 pack-year smoking history. He has never used smokeless tobacco. He reports that he does not drink alcohol and does not use drugs.  Patient is recently a widower after being married for 60 years.  Patient's wife died 11-03-2021 at the [facility].  Patient has 3 daughters who are involved in his care.  Patient previously worked as a Banker for [employer].  ADVANCE DIRECTIVES:  Does not have  CODE STATUS:   PAST MEDICAL HISTORY: Past Medical History:  Diagnosis Date   Abnormal ECG    a. baseline inferolateral ST elevation dating back to at least 2012.   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;  Service: Urology;  Laterality: N/A;  RAD TECH OK PER ANNE AT MAIN OR    UVULOPALATOPHARYNGOPLASTY     VIDEO BRONCHOSCOPY WITH ENDOBRONCHIAL ULTRASOUND Bilateral 11/12/2021   Procedure: VIDEO BRONCHOSCOPY WITH ENDOBRONCHIAL ULTRASOUND;  Surgeon: Ottie Glazier, MD;  Location: [facility];  Service: Thoracic;  Laterality: Bilateral;    HEMATOLOGY/ONCOLOGY HISTORY:  Oncology History   No history exists.    ALLERGIES:  has No Known Allergies.  MEDICATIONS:  Current Outpatient Medications  Medication Sig Dispense Refill   acetaminophen (TYLENOL) 325 MG tablet Take 650 mg by mouth every 6 (six) hours as needed.     atorvastatin (LIPITOR) 80 MG tablet Take 1 tablet (80 mg total) by mouth daily. (Patient not taking: Reported on 11/23/2021) 30 tablet 0   benzonatate (TESSALON) 100 MG capsule Take 2 capsules (200 mg total) by mouth every 8 (eight) hours. (Patient not taking: Reported on 11/23/2021) 21 capsule 0   ipratropium (ATROVENT) 0.06 % nasal spray Place 2 sprays into both nostrils 4 (four) times daily. (Patient not taking:  Reported on 11/23/2021) 15 mL 12   metoprolol tartrate (LOPRESSOR) 25 MG tablet Take 0.5 tablets (12.5 mg total) by mouth 2 (two) times daily. (Patient not taking: Reported on 11/23/2021) 30 tablet 0   mirtazapine (REMERON) 7.5 MG tablet Take 1 tablet (7.5 mg total) by mouth at bedtime. 30 tablet 2   ondansetron (ZOFRAN) 4 MG tablet Take 1 tablet (4 mg total) by mouth every 8 (eight) hours as needed for nausea or vomiting. 20 tablet 0   prochlorperazine (COMPAZINE) 10 MG  tablet Take 1 tablet (10 mg total) by mouth every 6 (six) hours as needed for nausea or vomiting. 30 tablet 0   No current facility-administered medications for this visit.    VITAL SIGNS: There were no vitals taken for this visit. There were no vitals filed for this visit.  Estimated body mass index is 22.67 kg/m as calculated from the following:   Height as of an earlier encounter on 11/23/21: 5' 9" (1.753 m).   Weight as of an earlier encounter on 11/23/21: 153 lb 8 oz (69.6 kg).  LABS: CBC:    Component Value Date/Time   WBC 8.6 11/16/2021 0453   HGB 13.2 11/16/2021 0453   HGB 15.1 11/02/2021 0954   HCT 40.2 11/16/2021 0453   HCT 44.7 11/02/2021 0954   PLT 231 11/16/2021 0453   PLT 222 11/02/2021 0954   MCV 90.1 11/16/2021 0453   MCV 90 11/02/2021 0954   NEUTROABS 5.6 11/09/2021 1536   LYMPHSABS 0.9 11/09/2021 1536   MONOABS 1.0 11/09/2021 1536   EOSABS 0.1 11/09/2021 1536   BASOSABS 0.0 11/09/2021 1536   Comprehensive Metabolic Panel:    Component Value Date/Time   NA 136 11/16/2021 0453   NA 141 11/02/2021 0954   K 4.5 11/16/2021 0453   CL 99 11/16/2021 0453   CO2 30 11/16/2021 0453   BUN 14 11/16/2021 0453   BUN 17 11/02/2021 0954   CREATININE 0.87 11/16/2021 0453   GLUCOSE 105 (H) 11/16/2021 0453   CALCIUM 8.9 11/16/2021 0453   AST 17 11/09/2021 1536   ALT 13 11/09/2021 1536   ALKPHOS 71 11/09/2021 1536   BILITOT 1.2 11/09/2021 1536   BILITOT 0.6 11/02/2021 0954   PROT 7.4 11/09/2021 1536   PROT 7.0 11/02/2021 0954   ALBUMIN 3.5 11/09/2021 1536   ALBUMIN 3.8 11/02/2021 0954    RADIOGRAPHIC STUDIES: DG Chest 2 View  Result Date: 11/09/2021 CLINICAL DATA:  Nausea, belching decreased appetite for 2 months, history prostate cancer EXAM: CHEST - 2 VIEW COMPARISON:  03/07/2017 rib radiographs, chest radiograph 11/16/2016 FINDINGS: Normal heart size, mediastinal contours, and pulmonary vascularity. Small calcifications in the chest bilaterally again identified  likely calcified pleural plaques. New LEFT perihilar mass 6.6 x 6.4 cm highly suspicious for pulmonary neoplasm. No acute infiltrate, pleural effusion or pneumothorax. No definite acute bone lesions. IMPRESSION: New LEFT perihilar mass 6.6 x 6.4 cm highly suspicious for a pulmonary neoplasm; CT chest with contrast recommended for further evaluation. Calcifications in the chest bilaterally most consistent with calcified pleural plaques, unchanged, question prior asbestos exposure. Aortic Atherosclerosis (ICD10-I70.0). Electronically Signed   By: Lavonia Dana M.D.   On: 11/09/2021 17:09   DG Abd 1 View  Result Date: 11/15/2021 CLINICAL DATA:  Abdominal pain and distention. Metastatic lung carcinoma. EXAM: ABDOMEN - 1 VIEW COMPARISON:  03/08/2017 FINDINGS: Gas-filled nondilated small bowel loops are seen. Moderate stool also noted, predominantly in the right colon. Right upper quadrant surgical clips again noted from prior  cholecystectomy. Brachytherapy seeds seen in the prostate bed. IMPRESSION: Nonobstructive bowel gas pattern.  No acute findings. Electronically Signed   By: Marlaine Hind M.D.   On: 11/15/2021 13:09   CT Angio Chest PE W/Cm &/Or Wo Cm  Result Date: 11/09/2021 CLINICAL DATA:  Pulmonary embolism (PE) suspected, high prob Two month history of nausea and decreased appetite. Symptoms after patient's wife done 2 months ago. EXAM: CT ANGIOGRAPHY CHEST WITH CONTRAST TECHNIQUE: Multidetector CT imaging of the chest was performed using the standard protocol during bolus administration of intravenous contrast. Multiplanar CT image reconstructions and MIPs were obtained to evaluate the vascular anatomy. RADIATION DOSE REDUCTION: This exam was performed according to the departmental dose-optimization program which includes automated exposure control, adjustment of the mA and/or kV according to patient size and/or use of iterative reconstruction technique. CONTRAST:  25m OMNIPAQUE IOHEXOL 350 MG/ML SOLN  COMPARISON:  Chest radiograph earlier today. FINDINGS: Cardiovascular: There are no filling defects within the pulmonary arteries to suggest pulmonary embolus. The lingular pulmonary arteries are attenuated due to left perihilar mass. Atherosclerosis of the thoracic aorta. No aortic aneurysm or acute aortic findings. The heart is normal in size. There is no pericardial effusion. Mediastinum/Nodes: Left upper lobe perihilar mass is contiguous with the hilum. There is likely separate suprahilar adenopathy within 18 mm lymph node series 5, image 46. Likely infrahilar lymph node measuring 2.4 cm series 5, image 58. 9 mm right hilar node. Occasional prominent left lower paratracheal nodes, including a 10 mm node series 5, image 38. No definite supraclavicular adenopathy. No visualized thyroid nodule. Upper esophagus is patulous. No esophageal wall thickening. Lungs/Pleura: Left perihilar upper lobe masslike opacity. This measures 6.6 x 5.0 x 6.1 cm, measured on series 7, image 46 and series 8, image 55. Margins are irregular and spiculated with linear extension to the periphery of the pleural surface of the left upper lobe. There are small adjacent satellite nodules. Separate left upper lobe 3 mm nodule series 7, image 32. Irregular opacity at the left lung apex which appears triangular measuring 11 mm, series 7, image 21. Biapical pleuroparenchymal scarring, some of which is calcified. Bilateral calcified pleural plaques. Multiple calcified granuloma in the right lower lobe. Severe narrowing of the left upper lobe bronchus due to in left perihilar mass. No pleural effusion. Upper Abdomen: Innumerable low-density lesions within the liver, largest lesions appear to be simple fluid density. Previous abdominal MRI 06/22/2019 demonstrated multiple hepatic cysts. No discrete adrenal nodule. Musculoskeletal: Slight flattening of mid thoracic vertebra. No destructive lytic or focal blastic osseous lesion. Review of the MIP  images confirms the above findings. IMPRESSION: 1. No pulmonary embolus. 2. Left perihilar upper lobe masslike opacity measuring 6.6 x 5.0 x 6.1 cm, contiguous with the hilum. Margins are irregular and spiculated with linear extension to the periphery of the left upper lobe. Findings are highly suspicious for primary bronchogenic malignancy. 3. Perihilar mass is contiguous with the hilum, however there appears to be a discrete left hilar adenopathy. Prominent left lower paratracheal nodes, and a nonspecific 9 mm right hilar node. 4. Separate 3 mm left upper lobe nodule. Irregular opacity at the left lung apex appears triangular measuring 11 mm, nonspecific but may be scarring. 5. Severe narrowing of the left upper lobe bronchus due to left perihilar mass. 6. Innumerable low-density lesions within the liver, largest lesions appear to be simple fluid density. These were previously characterized as hepatic cysts on MRI, although not well assessed on the current exam due to  phase of contrast. 7. 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 Aortic Atherosclerosis (ICD10-I70.0). Electronically Signed   By: Keith Rake M.D.   On: 11/09/2021 18:02   MR BRAIN W WO CONTRAST  Result Date: 11/10/2021 CLINICAL DATA:  Metastatic disease evaluation, lung mass EXAM: MRI HEAD WITHOUT AND WITH CONTRAST TECHNIQUE: Multiplanar, multiecho pulse sequences of the brain and surrounding structures were obtained without and with intravenous contrast. CONTRAST:  21m GADAVIST GADOBUTROL 1 MMOL/ML IV SOLN COMPARISON:  None. FINDINGS: Brain: There is no acute infarction or intracranial hemorrhage. There is no intracranial mass, mass effect, or edema. There is no hydrocephalus or extra-axial fluid collection. Prominence of the ventricles and sulci reflects parenchymal volume loss. Patchy T2 hyperintensity in the supratentorial white matter is nonspecific but may reflect minor chronic microvascular ischemic changes.  No abnormal enhancement. Vascular: Major vessel flow voids at the skull base are preserved. Skull and upper cervical spine: Normal marrow signal is preserved. Sinuses/Orbits: Paranasal sinus mucosal thickening. Orbits are unremarkable. Other: Sella is unremarkable.  Mastoid air cells are clear. IMPRESSION: No evidence of intracranial metastatic disease. Electronically Signed   By: PMacy MisM.D.   On: 11/10/2021 14:37   CT ABDOMEN PELVIS W CONTRAST  Result Date: 11/11/2021 CLINICAL DATA:  Non-small cell lung cancer staging. EXAM: CT ABDOMEN AND PELVIS WITH CONTRAST TECHNIQUE: Multidetector CT imaging of the abdomen and pelvis was performed using the standard protocol following bolus administration of intravenous contrast. RADIATION DOSE REDUCTION: This exam was performed according to the departmental dose-optimization program which includes automated exposure control, adjustment of the mA and/or kV according to patient size and/or use of iterative reconstruction technique. CONTRAST:  867mOMNIPAQUE IOHEXOL 300 MG/ML  SOLN COMPARISON:  Chest CTA 11/09/2021, abdominal MRI 06/22/2019 and abdominal ultrasound 01/02/2018. FINDINGS: Lower chest: Calcified pleural plaque, pleural thickening and linear scarring at both lung bases. The known left hilar mass is not imaged on this abdominal CT. No significant pleural or pericardial effusion. Aortic and coronary artery atherosclerosis are noted. Hepatobiliary: Numerous hepatic cysts are again noted, similar to previous MRI. No suspicious liver lesions are identified. There is no significant biliary dilatation status post cholecystectomy. Pancreas: Unremarkable. No pancreatic ductal dilatation or surrounding inflammatory changes. Spleen: Normal in size without focal abnormality. Adrenals/Urinary Tract: Both adrenal glands appear normal. Small bilateral renal cysts. No evidence of renal mass, urinary tract calculus or hydronephrosis. The bladder appears unremarkable.  Stomach/Bowel: Enteric contrast was administered and has passed into the rectum. The stomach appears unremarkable for its degree of distension. No evidence of bowel wall thickening, distention or surrounding inflammatory change. The appendix appears normal. Moderate stool throughout the colon. Vascular/Lymphatic: There are no enlarged abdominal or pelvic lymph nodes. Diffuse aortic and branch vessel atherosclerosis. No evidence of aneurysm or large vessel occlusion. Reproductive: Multiple brachytherapy seeds are present within the prostate gland which otherwise appears unremarkable. Other: No ascites or peritoneal nodularity. Musculoskeletal: There is a lytic destructive mass within the right iliac bone with an associated soft tissue mass, highly suspicious for metastatic disease. This measures 3.7 x 3.2 cm on image 59/2. No other suspicious osseous findings are seen. There are degenerative changes throughout the lumbar spine which contribute to mild spinal stenosis and foraminal narrowing at the lower 4 disc space levels. IMPRESSION: 1. 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 As this patient also has a remote history of prostate cancer, correlation with serum PSA levels recommended, although there are  no blastic lesions. 2. No other evidence of metastatic disease in the abdomen or pelvis. 3. Innumerable hepatic cysts, similar to previous MRI. Small renal cysts. 4. Multilevel lumbar spondylosis. 5. Aortic Atherosclerosis (ICD10-I70.0) and Emphysema (ICD10-J43.9). Electronically Signed   By: Richardean Sale M.D.   On: 11/11/2021 09:10   DG Chest Port 1 View  Result Date: 11/12/2021 CLINICAL DATA:  Status post left bronchoscopy EXAM: PORTABLE CHEST 1 VIEW COMPARISON:  Previous studies including the examination of 11/09/2021 FINDINGS: Cardiac size is within normal limits. There is 7.5 cm homogeneous opacity in the left hilum and parahilar region  with no significant interval change. There is no pneumothorax. Increased interstitial markings are seen in both lower lung fields which may be partly due to poor inspiration. There is minimal blunting of left lateral CP angle. There are scattered pleural calcifications. IMPRESSION: There is 7.5 cm opacity in the left parahilar region suggesting neoplastic process. Subtle increased markings in the lower lung fields may be due to poor inspiration. There is blunting of left lateral CP angle suggesting minimal effusion. Electronically Signed   By: Elmer Picker M.D.   On: 11/12/2021 15:37   DG C-Arm 1-60 Min-No Report  Result Date: 11/12/2021 Fluoroscopy was utilized by the requesting physician.  No radiographic interpretation.   ECHOCARDIOGRAM COMPLETE  Result Date: 11/10/2021    ECHOCARDIOGRAM REPORT   Patient Name:   LEANARD DIMAIO Date of Exam: 11/10/2021 Medical Rec #:  [id number]   Height:       69.0 in Accession #:    [id number]  Weight:       135.0 lb Date of Birth:  1941-10-01    BSA:          1.748 m Patient Age:    41 years    BP:           129/84 mmHg Patient Gender: M           HR:           78 bpm. Exam Location:  [facility] Procedure: 2D Echo, Color Doppler, Cardiac Doppler and Strain Analysis Indications:     R07.9 Chest Pain  History:         Patient has no prior history of Echocardiogram examinations.                  Risk Factors:Sleep Apnea.  Sonographer:     Charmayne Sheer Referring Phys:  [place] Diagnosing Phys: Ida Rogue MD  Sonographer Comments: Suboptimal parasternal window. Global longitudinal strain was attempted. IMPRESSIONS  1. Left ventricular ejection fraction, by estimation, is 55 to 60%. The left ventricle has normal function. The left ventricle has no regional wall motion abnormalities. Left ventricular diastolic parameters are consistent with Grade I diastolic dysfunction (impaired relaxation). The average left ventricular global longitudinal strain is -18.0 %. The  global longitudinal strain is normal.  2. Right ventricular systolic function is normal. The right ventricular size is normal.  3. The mitral valve is normal in structure. No evidence of mitral valve regurgitation. No evidence of mitral stenosis.  4. The aortic valve was not well visualized. Aortic valve regurgitation is not visualized. No aortic stenosis is present.  5. The inferior vena cava is normal in size with greater than 50% respiratory variability, suggesting right atrial pressure of 3 mmHg. FINDINGS  Left Ventricle: Left ventricular ejection fraction, by estimation, is 55 to 60%. The left ventricle has normal function. The left ventricle has no regional wall  motion abnormalities. The average left ventricular global longitudinal strain is -18.0 %. The global longitudinal strain is normal. The left ventricular internal cavity size was normal in size. There is no left ventricular hypertrophy. Left ventricular diastolic parameters are consistent with Grade I diastolic dysfunction (impaired relaxation). Right Ventricle: The right ventricular size is normal. No increase in right ventricular wall thickness. Right ventricular systolic function is normal. Left Atrium: Left atrial size was normal in size. Right Atrium: Right atrial size was normal in size. Pericardium: There is no evidence of pericardial effusion. Mitral Valve: The mitral valve is normal in structure. No evidence of mitral valve regurgitation. No evidence of mitral valve stenosis. MV peak gradient, 2.4 mmHg. The mean mitral valve gradient is 1.0 mmHg. Tricuspid Valve: The tricuspid valve is normal in structure. Tricuspid valve regurgitation is not demonstrated. No evidence of tricuspid stenosis. Aortic Valve: The aortic valve was not well visualized. Aortic valve regurgitation is not visualized. No aortic stenosis is present. Aortic valve mean gradient measures 3.0 mmHg. Aortic valve peak gradient measures 5.4 mmHg. Aortic valve area, by VTI  measures 2.93 cm. Pulmonic Valve: The pulmonic valve was normal in structure. Pulmonic valve regurgitation is not visualized. No evidence of pulmonic stenosis. Aorta: The aortic root is normal in size and structure. Venous: The inferior vena cava is normal in size with greater than 50% respiratory variability, suggesting right atrial pressure of 3 mmHg. IAS/Shunts: No atrial level shunt detected by color flow Doppler.  LEFT VENTRICLE PLAX 2D LVIDd:         4.03 cm   Diastology LVIDs:         2.25 cm   LV e' medial:    6.09 cm/s LV PW:         0.96 cm   LV E/e' medial:  11.1 LV IVS:        0.70 cm   LV e' lateral:   11.00 cm/s LVOT diam:     2.10 cm   LV E/e' lateral: 6.2 LV SV:         56 LV SV Index:   32        2D Longitudinal Strain LVOT Area:     3.46 cm  2D Strain GLS Avg:     -18.0 %  RIGHT VENTRICLE RV Basal diam:  3.68 cm LEFT ATRIUM             Index        RIGHT ATRIUM           Index LA diam:        2.70 cm 1.54 cm/m   RA Area:     13.10 cm LA Vol (A2C):   37.0 ml 21.16 ml/m  RA Volume:   31.20 ml  17.85 ml/m LA Vol (A4C):   25.1 ml 14.36 ml/m LA Biplane Vol: 31.8 ml 18.19 ml/m  AORTIC VALVE                    PULMONIC VALVE AV Area (Vmax):    2.77 cm     PV Vmax:       0.61 m/s AV Area (Vmean):   2.55 cm     PV Vmean:      41.500 cm/s AV Area (VTI):     2.93 cm     PV VTI:        0.105 m AV Vmax:           116.00 cm/s  PV  Peak grad:  1.5 mmHg AV Vmean:          81.000 cm/s  PV Mean grad:  1.0 mmHg AV VTI:            0.190 m AV Peak Grad:      5.4 mmHg AV Mean Grad:      3.0 mmHg LVOT Vmax:         92.70 cm/s LVOT Vmean:        59.600 cm/s LVOT VTI:          0.161 m LVOT/AV VTI ratio: 0.85  AORTA Ao Root diam: 3.60 cm MITRAL VALVE MV Area (PHT): 3.97 cm    SHUNTS MV Area VTI:   2.77 cm    Systemic VTI:  0.16 m MV Peak grad:  2.4 mmHg    Systemic Diam: 2.10 cm MV Mean grad:  1.0 mmHg MV Vmax:       0.78 m/s MV Vmean:      55.6 cm/s MV Decel Time: 191 msec MV E velocity: 67.70 cm/s MV A  velocity: 81.80 cm/s MV E/A ratio:  0.83 Ida Rogue MD Electronically signed by Ida Rogue MD Signature Date/Time: 11/10/2021/2:12:02 PM    Final     PERFORMANCE STATUS (ECOG) : 2 - Symptomatic, <50% confined to bed  Review of Systems Unless otherwise noted, a complete review of systems is negative.  Physical Exam General: NAD Pulmonary: Nonlabored Extremities: no edema, no joint deformities Skin: no rashes Neurological: Weakness but otherwise nonfocal  IMPRESSION: I met with patient and daughter today in clinic following their visit with Dr. Janese Banks.  Since discharging from the hospital, patient endorses severe fatigue and chronic nausea without vomiting.  He has also had persistent rib pain.  Appetite has been poor.  Performance status is poor with patient primarily sitting in chair during the day.  Patient has been using Zofran twice daily without significant improvement in nausea.  Recommended increasing Zofran to 3 times daily and adding prochlorperazine every 6 hours as needed.  We discussed scheduled dosing of antiemetics for the next several days.  Regarding pain, patient has been fearful of taking pain medications due to history of constipation.  He is agreeable to trying low-dose tramadol.  We discussed the importance of maintaining daily bowel regimen with MiraLAX/senna with a target of a bowel movement every day or every other day.  Patient is quite weak/frail.  I suggested that we could involve home health and/for community palliative care.  Patient wants to think about these options.  We discussed CODE STATUS.  Patient does not think that he would want to be resuscitated or have his life prolonged artificially on machines.  He does think that he would want to treat the treatable.  I sent patient home with a MOST form to review with family.  Patient was also sent home with ACP documents to complete.  Patient says that his goals are to pursue treatment if any options are  available.  However, he is unlikely a viable candidate for systemic chemotherapy.  NGS testing is pending.  Dr. Janese Banks also discussed the option of single agent immunotherapy versus hospice.  We will plan to follow-up with patient virtually next week regarding symptoms and to further discuss goals.  PLAN: -Continue current scope of treatment -Tramadol 25 to 50 mg every 6 hours as needed #30 tablets -Daily bowel regimen -ACP/MOST form reviewed -Follow-up telephone visit next week   Patient expressed understanding and was in agreement with this plan.  He also understands that He can call the clinic at any time with any questions, concerns, or complaints.     Time Total: 15 minutes  Visit consisted of counseling and education dealing with the complex and emotionally intense issues of symptom management and palliative care in the setting of serious and potentially life-threatening illness.Greater than 50%  of this time was spent counseling and coordinating care related to the above assessment and plan.  Signed by: Altha Harm, PhD, NP-C

## 2021-11-23 NOTE — Progress Notes (Signed)
[place] Work  Initial Assessment   Jared Mullen is a 81 y.o. year old male contacted by phone. Clinical Social Work was referred by Jared Chang NP for assessment of psychosocial needs.   SDOH (Social Determinants of Health) assessments performed: No   Distress Screen completed: No No flowsheet data found.    Family/Social Information:  Housing Arrangement: patient lives alonebut daughter Jared Mullen staying with him indefinitely and two other daughters Jared Mullen and Jared Mullen are assisting with care giving. Family members/support persons in your life? Family Transportation concerns: no  Employment: Retired. Income source: Paediatric nurse concerns: No Type of concern: None Food access concerns: no Religious or spiritual practice: yes Services Currently in place:  N/A  Coping/ Adjustment to diagnosis: Patient understands treatment plan and what happens next? yes Concerns about diagnosis and/or treatment: Pain or discomfort during procedures Patient reported stressors: Depression and Adjusting to my illness Hopes and priorities: N/A Patient enjoys time with family/ friends Current coping skills/ strengths: Scientist, research (life sciences)  and Supportive family/friends     SUMMARY: Current SDOH Barriers:  ADL IADL limitations, Mental Health Concerns , and Social Isolation  Clinical Social Work Clinical Goal(s):  patient will work with SW to address concerns related to patient's symptoms of depression and anxiety  Interventions: Discussed common feeling and emotions when being diagnosed with cancer, and the importance of support during treatment Informed patient of the support team roles and support services at [facility] Provided [place] contact information and encouraged patient to call with any questions or concerns Provided patient with information about Lung Cancer Support Group and Caregiver Connections group and Provided education regarding resources available to the  patient and role of CSW in patient care.   Follow Up Plan: Patient will contact CSW with any support or resource needs Patient verbalizes understanding of plan: Yes  CSW spoke with patient's primary care giver, daughter, Jared Mullen [phone].  Jared Mullen stated patient is sleeping a lot and states he is too tired to get out of bed.  Jared Mullen stated the patient is drinking plenty of fluid and is eating soups and pureed foods, since he has difficulty chewing due to missing molars.  Jared Mullen stated she is concerned the patient broke a rib from vomiting, and the patient is refusing to take narcotic pain medication due to concerns of constipation.  Jared Mullen stated the patient was in a lot of pain and she did not think she would be able to assist him with ambulation to vehicle to come to his appointment this afternoon.  CSW stated I would update the patient Oncologist, RN and Engineer, site.  Jared Mullen also inquired about palliative care and stated she was not certain if patient had been referred to palliative care but she was interested in the patient receiving palliative care at home.  CSW stated I would update palliative care NP at [facility]. CSW spoke to Jared Mullen about resources available to the patient and to the care giver.  Jared Mullen expressed an interest in the Caregiver Connections group.  CSW stated I would send her information to [email or web address].  CSW stated there may be a waiting list, and Jared Mullen verbalized understanding.  Jared Mullen , LCSW

## 2021-11-24 DIAGNOSIS — C3412 Malignant neoplasm of upper lobe, left bronchus or lung: Secondary | ICD-10-CM | POA: Insufficient documentation

## 2021-11-24 NOTE — Progress Notes (Signed)
Met with patient and his daughter during follow up visit with Dr. Janese Banks. All questions answered during visit. Reviewed upcoming appts. Contact info given and instructed to call with any questions or needs. Pt and his daughter verbalized understanding.

## 2021-11-25 ENCOUNTER — Telehealth: Payer: Medicare Other | Admitting: Hospice and Palliative Medicine

## 2021-11-27 ENCOUNTER — Inpatient Hospital Stay: Payer: Medicare Other

## 2021-11-27 NOTE — Progress Notes (Signed)
Nutrition  Patient did not attend nutrition appointment today at 2:30pm.  Will send message to scheduling to offer another appointment. Will also send message to provider.    Jobie Popp B. Zenia Resides, [place], [place] Registered Dietitian [phone] (mobile)

## 2021-12-01 ENCOUNTER — Telehealth: Payer: Self-pay | Admitting: Student

## 2021-12-01 ENCOUNTER — Inpatient Hospital Stay: Payer: Medicare Other

## 2021-12-01 ENCOUNTER — Other Ambulatory Visit: Payer: Self-pay

## 2021-12-01 NOTE — Progress Notes (Signed)
Nutrition Assessment   Reason for Assessment:  Referral from [name], NP for poor appetite and weight loss   ASSESSMENT:  81 year old male with left upper lobe lung mass and lytic lesion to right iliac crest.  Past medical history of prostate cancer, sleep apnea, recent hospital admission for weight loss and weakness.  Wife past away in Dec 2022.    Messaged received from scheduling to call daughter and discuss purpose of nutrition visit.    RD called and spoke with daughter, Linus Orn via phone.  Explained RD role.  Linus Orn reports that patient has a poor appetite, teeth have been pulled recently and has nausea.  She prepares patient soups, applesauce.  Patient drinks ensure plus.    Medications: reviewed   Labs: reviewed   Anthropometrics:   Height: 69 inches Weight: 153 lb 8 oz on 2/6 165 lb 8 oz on 11/02/21 BMI: 22  7% weight loss in the 3 weeks,  significant   NUTRITION DIAGNOSIS: Unintentional weight loss related to likely cancer and recent loss of wife as evidenced by 7% weight loss in the 3 weeks and poor po intake   INTERVENTION:  Encouraged utilizing antiemetics, both zofran and compazine to help with nausea. Strategies discussed to help with nausea.  Will mail handout Encouraged 350 calorie oral nutrition supplement or higher.  Discussed options.  Will mail coupons Contact information provided. Daughter will reach out to RD if needed in the future   Next Visit: no follow-up at this time Daughter prefers to reach out to [place]. Zenia Resides, [place], [place] Registered Dietitian [phone] (mobile)

## 2021-12-01 NOTE — Telephone Encounter (Signed)
Attempted to contact patient to offer to schedule Palliative Consult, no answer - left message with reason for call along with my name and call back number requesting a call back to answer any questions and to schedule an appointment

## 2021-12-02 ENCOUNTER — Encounter: Payer: Self-pay | Admitting: *Deleted

## 2021-12-02 ENCOUNTER — Encounter: Payer: Self-pay | Admitting: Medical

## 2021-12-02 ENCOUNTER — Ambulatory Visit (INDEPENDENT_AMBULATORY_CARE_PROVIDER_SITE_OTHER): Payer: Medicare Other | Admitting: Medical

## 2021-12-02 VITALS — BP 120/80 | HR 94 | Ht 69.0 in

## 2021-12-02 DIAGNOSIS — I214 Non-ST elevation (NSTEMI) myocardial infarction: Secondary | ICD-10-CM

## 2021-12-02 DIAGNOSIS — C3490 Malignant neoplasm of unspecified part of unspecified bronchus or lung: Secondary | ICD-10-CM

## 2021-12-02 DIAGNOSIS — R778 Other specified abnormalities of plasma proteins: Secondary | ICD-10-CM | POA: Diagnosis not present

## 2021-12-02 NOTE — Progress Notes (Signed)
Cardiology Office Note:    Date:  12/02/2021   ID:  Jared Mullen, DOB 06-Mar-1941, MRN [id number]  PCP:  Birdie Sons, MD  [facility] Cardiologist:  Ida Rogue, MD  [place] Electrophysiologist:  None   Referring MD: Birdie Sons, MD   Chief Complaint: hospital follow-up  History of Present Illness:    Jared Mullen is a 81 y.o. male with a hx of prostate cancer, smoking history, sleep apnea, recent dx of lung cancer who is being seen for hospital follow-up.  Over the past 6-12 months, he has had a productive cough, which has been treated with tessalon and ipratropium.Also, he reported abdominal discomfort and nausea as well as decreased appetite and weight loss of 30lbs. 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  The patient was recently admitted 11/10/21 with the above symptoms. HS trop was elevated to 400s and EKG showed mild inferolateral ST elevation and prior septal infarct w/ TWI V1 and V2, overall similar to prior. CTA negative for PE, but showed large left lung mass and muktipe nodules. He was treated with IV heparin x 48 hours. Echo showed LVEF 55-60%. Plan was for ischemic evaluation as outpatient. Biopsy showed adenocarcinoma.   Today, the patient reports has been doing Ok at home. HE has cancer and stating he does not want treatment. They are still doing some work-up, but patient not interested in further treatments.without treatment he would live maybe 6 month or less.  Says he doesn't want to live much longer. Patient sleeps a lot. HE is tired all the time. No chest pain or shortness of breath. No appetite, eats very little. They will get palliative care in the home. Discussed stress test and cardiac CTA, however would not likely change care. Family said they are not giving him lipitor or Toprol due to low energy with limited life expenctancy.   Past Medical History:  Diagnosis Date   Abnormal ECG    a. baseline inferolateral ST  elevation dating back to at least 2012.   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   Sleep apnea    Urinary frequency    Weak urinary stream    Weight loss     Past Surgical History:  Procedure Laterality Date   biospy  09/30/2011-   TRUS/Bx Prostate - 5/12/biopsies positive for cancer, glandular size 32.5 cc   CHOLECYSTECTOMY  1992   RADIOACTIVE SEED IMPLANT  02/09/2012   Procedure: RADIOACTIVE SEED IMPLANT;  Surgeon: Franchot Gallo, MD;  Location: [facility];  Service: Urology;  Laterality: N/A;  RAD TECH OK PER ANNE AT MAIN OR    UVULOPALATOPHARYNGOPLASTY     VIDEO BRONCHOSCOPY WITH ENDOBRONCHIAL ULTRASOUND Bilateral 11/12/2021   Procedure: VIDEO BRONCHOSCOPY WITH ENDOBRONCHIAL ULTRASOUND;  Surgeon: Ottie Glazier, MD;  Location: [facility] ORS;  Service: Thoracic;  Laterality: Bilateral;    Current Medications: Current Meds  Medication Sig   acetaminophen (TYLENOL) 325 MG tablet Take 650 mg by mouth every 6 (six) hours as needed.   atorvastatin (LIPITOR) 80 MG tablet Take 1 tablet (80 mg total) by mouth daily.   metoprolol tartrate (LOPRESSOR) 25 MG tablet Take 0.5 tablets (12.5 mg total) by mouth 2 (two) times daily.   mirtazapine (REMERON) 7.5 MG tablet Take 1 tablet (  7.5 mg total) by mouth at bedtime.   ondansetron (ZOFRAN) 4 MG tablet Take 1 tablet (4 mg total) by mouth every 8 (eight) hours as needed for nausea or vomiting.   prochlorperazine (COMPAZINE) 10 MG tablet Take 1 tablet (10 mg total) by mouth every 6 (six) hours as needed for nausea or vomiting.   traMADol (ULTRAM) 50 MG tablet Take 0.5-1 tablets (25-50 mg total) by mouth every 6 (six) hours as needed.     Allergies:   Patient has no known allergies.   Social History   Socioeconomic History   Marital status: Widowed    Spouse name: Not on file   Number of  children: 3   Years of education: Not on file   Highest education level: High school graduate  Occupational History   Occupation: retired  Tobacco Use   Smoking status: Former    Packs/day: 2.00    Years: 28.00    Pack years: 56.00    Types: Cigarettes    Quit date: 10/18/1980    Years since quitting: 41.1   Smokeless tobacco: Never  Substance and Sexual Activity   Alcohol use: No   Drug use: No   Sexual activity: Not on file    Comment: Low libido  Other Topics Concern   Not on file  Social History Narrative   Lives locally.  Was caring for his wife until she passed in December.  Sedentary.   Social Determinants of Health   Financial Resource Strain: Not on file  Food Insecurity: Not on file  Transportation Needs: Not on file  Physical Activity: Not on file  Stress: Not on file  Social Connections: Not on file     Family History: The patient's family history includes Breast cancer in his sister; Cancer in his brother, sister, and sister; Heart disease in his father; Kidney disease in his brother; Lung cancer in his brother and sister.  ROS:   Please see the history of present illness.     All other systems reviewed and are negative.  EKGs/Labs/Other Studies Reviewed:    The following studies were reviewed today: 2D Echocardiogram 01.24.2023    1. Left ventricular ejection fraction, by estimation, is 55 to 60%. The  left ventricle has normal function. The left ventricle has no regional  wall motion abnormalities. Left ventricular diastolic parameters are  consistent with Grade I diastolic  dysfunction (impaired relaxation). The average left ventricular global  longitudinal strain is -18.0 %. The global longitudinal strain is normal.   2. Right ventricular systolic function is normal. The right ventricular  size is normal.   3. The mitral valve is normal in structure. No evidence of mitral valve  regurgitation. No evidence of mitral stenosis.   4. The aortic valve  was not well visualized. Aortic valve regurgitation  is not visualized. No aortic stenosis is present.   5. The inferior vena cava is normal in size with greater than 50%  respiratory variability, suggesting right atrial pressure of 3 mmHg.   EKG:  EKG is  ordered today.  The ekg ordered today demonstrates NSR, 94bpm, no significant ST/T wave changes  Recent Labs: 11/09/2021: ALT 13; TSH 1.767 11/16/2021: BUN 14; Creatinine, Ser 0.87; Hemoglobin 13.2; Platelets 231; Potassium 4.5; Sodium 136  Recent Lipid Panel    Component Value Date/Time   CHOL 134 11/09/2021 1536   CHOL 184 05/01/2018 1048   TRIG 34 11/09/2021 1536   HDL 41 11/09/2021 1536   HDL 59 05/01/2018 1048  CHOLHDL 3.3 11/09/2021 1536   VLDL 7 11/09/2021 1536   LDLCALC 86 11/09/2021 1536   LDLCALC 112 (H) 05/01/2018 1048    Physical Exam:    VS:  BP 120/80 (BP Location: Right Arm, Patient Position: Sitting, Cuff Size: Normal)    Pulse 94    Ht 5\' 9"  (1.753 m)    BMI 22.67 kg/m     Wt Readings from Last 3 Encounters:  11/23/21 153 lb 8 oz (69.6 kg)  11/09/21 135 lb (61.2 kg)  11/02/21 165 lb 8 oz (75.1 kg)     GEN:  frail elderly male HEENT: Normal NECK: No JVD; No carotid bruits LYMPHATICS: No lymphadenopathy CARDIAC: RRR, no murmurs, rubs, gallops RESPIRATORY:  diffusely diminished  ABDOMEN: Soft, non-tender, non-distended MUSCULOSKELETAL:  No edema; No deformity  SKIN: Warm and dry NEUROLOGIC:  Alert and oriented x 3 PSYCHIATRIC:  Normal affect   ASSESSMENT:    1. NSTEMI (non-ST elevated myocardial infarction) (HCC)   2. Elevated troponin   3. 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 Patient not wanting any treatment, not wanting to live anymore. Without treatment estimated 6 months to live. They are starting to follow  with palliative care. Patient is tired all day, sleeps a lot, and has low appetite. He will continue to follow with oncology/palliative.  Elevated troponin Elevated troponin in the hospital to 400s. No anginal symptoms reported. He was sent home on Lipitor and Lopressor, however patient had generalized weakness, and family stopped medications with improvement of symptoms. Long discussion with patient and family. Given the above situation, no plan for further ischemic work-up as it would not change significantly change outcome or expected prognosis . Can follow-up PRN.     Disposition: Follow up prn with MD/APP     Signed, Marcell Chavarin Ninfa Meeker, PA-C  12/02/2021 2:50 PM    [facility]

## 2021-12-02 NOTE — Patient Instructions (Signed)
Medication Instructions:  Your physician recommends that you continue on your current medications as directed. Please refer to the Current Medication list given to you today.   *If you need a refill on your cardiac medications before your next appointment, please call your pharmacy*   Lab Work: None ordered If you have labs (blood work) drawn today and your tests are completely normal, you will receive your results only by: [place] (if you have MyChart) OR A paper copy in the mail If you have any lab test that is abnormal or we need to change your treatment, we will call you to review the results.   Testing/Procedures: None Ordered   Follow-Up: At [facility], you and your health needs are our priority.  As part of our continuing mission to provide you with exceptional heart care, we have created designated Provider Care Teams.  These Care Teams include your primary Cardiologist (physician) and Advanced Practice Providers (APPs -  Physician Assistants and Nurse Practitioners) who all work together to provide you with the care you need, when you need it.  We recommend signing up for the patient portal called "MyChart".  Sign up information is provided on this After Visit Summary.  MyChart is used to connect with patients for Virtual Visits (Telemedicine).  Patients are able to view lab/test results, encounter notes, upcoming appointments, etc.  Non-urgent messages can be sent to your provider as well.   To learn more about what you can do with MyChart, go to [email or web address].    Your next appointment:   Follow up as needed  The format for your next appointment:   In person  Provider:   You may see Ida Rogue, MD or one of the following Advanced Practice Providers on your designated Care Team:   Murray Hodgkins, NP Christell Faith, PA-C Cadence Kathlen Mody, [place]    Other Instructions

## 2021-12-02 NOTE — Progress Notes (Signed)
Per [facility], Jared Mullen results are still pending at this time. Results expected to be released around 2/28. Per Dr. Janese Banks, okay to reschedule his video visit on 2/16. Appts rescheduled and pt's daughter, [name], made aware and confirmed appts. Instructed to call back with any questions or needs. [name] verbalized understanding.

## 2021-12-03 ENCOUNTER — Inpatient Hospital Stay: Payer: Medicare Other | Admitting: Oncology

## 2021-12-04 ENCOUNTER — Inpatient Hospital Stay (HOSPITAL_BASED_OUTPATIENT_CLINIC_OR_DEPARTMENT_OTHER): Payer: Medicare Other | Admitting: Hospice and Palliative Medicine

## 2021-12-04 ENCOUNTER — Other Ambulatory Visit: Payer: Self-pay

## 2021-12-04 DIAGNOSIS — R918 Other nonspecific abnormal finding of lung field: Secondary | ICD-10-CM | POA: Diagnosis not present

## 2021-12-04 MED ORDER — PROCHLORPERAZINE MALEATE 10 MG PO TABS: 10.0000 mg | ORAL_TABLET | Freq: Four times a day (QID) | ORAL | 0 refills | Status: DC | PRN

## 2021-12-04 MED ORDER — MIRTAZAPINE 7.5 MG PO TABS: 15.0000 mg | ORAL_TABLET | Freq: Every day | ORAL | 2 refills | Status: DC

## 2021-12-04 MED ORDER — ONDANSETRON HCL 4 MG PO TABS: 4.0000 mg | ORAL_TABLET | Freq: Three times a day (TID) | ORAL | 0 refills | Status: DC | PRN

## 2021-12-04 NOTE — Progress Notes (Signed)
Virtual Visit via Telephone Note  I connected with Jared Mullen on 12/04/21 at  1:30 PM EST by telephone and verified that I am speaking with the correct person using two identifiers.  Location: Patient: Home Provider: Clinic   I discussed the limitations, risks, security and privacy concerns of performing an evaluation and management service by telephone and the availability of in person appointments. I also discussed with the patient that there may be a patient responsible charge related to this service. The patient expressed understanding and agreed to proceed.   History of Present Illness: Jared Mullen is a 81 y.o. male with multiple medical problems including with multiple medical problems including with multiple medical problems including history of prostate cancer, sleep apnea, admitted to the hospital 11/10/2021 for evaluation of progressive weakness and weight loss.  CTA of the chest revealed a left upper lobe lung mass.  CT of the abdomen and pelvis revealed a lytic lesion to the right iliac crest.  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  Palliative care was consulted to help address goals.   Observations/Objective: I called and spoke with patient and daughter by phone.  Patient reports that he is still having significant difficulty sleeping.  He was started on mirtazapine but daughter does not feel like the dose is strong enough.  It is reasonable to increase mirtazapine to 15 mg nightly and see if that helps him sleep as a likely also help with depression and appetite.  Daughter request refills of Zofran and prochlorperazine.  Patient continues to have occasional pain but is not interested in taking tramadol.  He is taking acetaminophen as needed.  Patient is mostly sedentary and daughter reports that he is having some skin breakdown.  We talked about offloading pressure and utilizing zinc oxide cream as a barrier protectant.  NGS testing is  still pending but daughter thinks that patient will likely forego treatment options.  We talked about the option of hospice.  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 The patient was provided an opportunity to ask questions and all were answered. The patient agreed with the plan and demonstrated an understanding of the instructions.   The patient was advised to call back or seek an in-person evaluation if the symptoms worsen or if the condition fails to improve as anticipated.  I provided 5 minutes of non-face-to-face time during this encounter.   [name], NP

## 2021-12-08 ENCOUNTER — Telehealth: Payer: Self-pay

## 2021-12-08 ENCOUNTER — Encounter: Payer: Self-pay | Admitting: Oncology

## 2021-12-08 NOTE — Telephone Encounter (Signed)
Scheduled initial palliative visit with Daughter, [name]

## 2021-12-09 ENCOUNTER — Other Ambulatory Visit (HOSPITAL_COMMUNITY): Payer: Self-pay

## 2021-12-11 ENCOUNTER — Telehealth: Payer: Self-pay

## 2021-12-11 ENCOUNTER — Inpatient Hospital Stay (HOSPITAL_BASED_OUTPATIENT_CLINIC_OR_DEPARTMENT_OTHER): Payer: Medicare Other | Admitting: Hospice and Palliative Medicine

## 2021-12-11 DIAGNOSIS — R918 Other nonspecific abnormal finding of lung field: Secondary | ICD-10-CM

## 2021-12-11 DIAGNOSIS — G4700 Insomnia, unspecified: Secondary | ICD-10-CM | POA: Diagnosis not present

## 2021-12-11 MED ORDER — ONDANSETRON HCL 4 MG PO TABS: 4.0000 mg | ORAL_TABLET | Freq: Three times a day (TID) | ORAL | 0 refills | Status: DC | PRN

## 2021-12-11 MED ORDER — PROCHLORPERAZINE MALEATE 10 MG PO TABS: 10.0000 mg | ORAL_TABLET | Freq: Four times a day (QID) | ORAL | 0 refills | Status: DC | PRN

## 2021-12-11 MED ORDER — TRAZODONE HCL 50 MG PO TABS: 50.0000 mg | ORAL_TABLET | Freq: Every day | ORAL | 0 refills | Status: DC

## 2021-12-11 NOTE — Telephone Encounter (Signed)
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  Labs from 11/16/21 assessed, no relevant lab abnormalities.   Current medication list in Epic reviewed, 1 relevant DDI with osimertinib identified: - atorvastatin - monitor for side effects of increased atorvastatin concentrations (elevated AST/ALT, myalgias)  Evaluated chart and no patient barriers to medication adherence identified.   Oral Oncology Clinic will continue to follow for insurance authorization, copayment issues, initial counseling and start date.  Benn Moulder, PharmD Pharmacy Resident  12/11/2021 9:05 AM

## 2021-12-11 NOTE — Progress Notes (Signed)
Virtual Visit via Telephone Note  I connected with Jared Mullen on 12/11/21 at 10:50 AM EST by telephone and verified that I am speaking with the correct person using two identifiers.  Location: Patient: Home Provider: Clinic   I discussed the limitations, risks, security and privacy concerns of performing an evaluation and management service by telephone and the availability of in person appointments. I also discussed with the patient that there may be a patient responsible charge related to this service. The patient expressed understanding and agreed to proceed.   History of Present Illness: Jared Mullen is a 81 y.o. male with multiple medical problems including with multiple medical problems including with multiple medical problems including history of prostate cancer, sleep apnea, admitted to the hospital 11/10/2021 for evaluation of progressive weakness and weight loss.  CTA of the chest revealed a left upper lobe lung mass.  CT of the abdomen and pelvis revealed a lytic lesion to the right iliac crest.  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  Palliative care was consulted to help address goals.   Observations/Objective: I called and spoke with and daughter by phone.    Discussed results of NGS testing.  EGFR mutation allows option for Tagrisso.  Patient/daughter are interested in trial of Tagrisso.  They recommend as they always have the option of discontinuing treatment in the future and pursuing hospice at that time.  Symptomatically, he still endorses insomnia despite increased dose of mirtazapine.  We will discontinue mirtazapine and rotate to trazodone.  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  Spoke with daughter who is in agreement with trial of Tagrisso.  Depression/insomnia -discontinue mirtazapine and start trazodone nightly  Nausea -refill  antiemetics  Follow Up Instructions: Telephone visit 2 to 3 weeks   I discussed the assessment and treatment plan with the patient. The patient was provided an opportunity to ask questions and all were answered. The patient agreed with the plan and demonstrated an understanding of the instructions.   The patient was advised to call back or seek an in-person evaluation if the symptoms worsen or if the condition fails to improve as anticipated.  I provided 10 minutes of non-face-to-face time during this encounter.   Jared Hong, NP

## 2021-12-14 ENCOUNTER — Other Ambulatory Visit (HOSPITAL_COMMUNITY): Payer: Self-pay

## 2021-12-14 ENCOUNTER — Encounter: Payer: Self-pay | Admitting: Oncology

## 2021-12-14 ENCOUNTER — Telehealth: Payer: Self-pay | Admitting: Pharmacist

## 2021-12-14 ENCOUNTER — Inpatient Hospital Stay (HOSPITAL_BASED_OUTPATIENT_CLINIC_OR_DEPARTMENT_OTHER): Payer: Medicare Other | Admitting: Oncology

## 2021-12-14 DIAGNOSIS — R918 Other nonspecific abnormal finding of lung field: Secondary | ICD-10-CM

## 2021-12-14 DIAGNOSIS — G4700 Insomnia, unspecified: Secondary | ICD-10-CM | POA: Diagnosis not present

## 2021-12-14 DIAGNOSIS — C3412 Malignant neoplasm of upper lobe, left bronchus or lung: Secondary | ICD-10-CM

## 2021-12-14 MED ORDER — OSIMERTINIB MESYLATE 80 MG PO TABS: 80.0000 mg | ORAL_TABLET | Freq: Every day | ORAL | 1 refills | Status: DC

## 2021-12-14 NOTE — Telephone Encounter (Addendum)
Oral Chemotherapy Pharmacist Encounter  Patient's daughter [name] came to clinic to pick up Ramos and for medication education.   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 Patient will take 1 tablet (80 mg total) by mouth daily.  Side effects include but not limited to: rash, diarrhea, decreased wbc/hgb/plt. Rash/itchy skin: They were informed to call and report any rash/itchy skin   Diarrhea: suggested the pick up loperamide to have on hand if needed. They know to call if he is having 4 or more loose stools per day.  [name] reported that her father was no longer taking his atorvastatin and metoprolol, both were removed from his medication list.   Reviewed with [name] importance of keeping a medication schedule and plan for any missed doses.  After discussion with [name], no patient barriers to medication adherence identified.   [name] voiced understanding and appreciation. All questions answered. Medication handout and NCCN patient guidelines provided.  Provided [name] with Oral Chemotherapy Navigation Clinic phone number. [name] knows to call the office with questions or concerns. Oral Chemotherapy Navigation Clinic will continue to follow.  Darl Pikes, PharmD, BCPS, BCOP, CPP Hematology/Oncology Clinical Pharmacist Practitioner [place]/DB/AP [facility] [phone]  12/14/2021 4:16 PM

## 2021-12-14 NOTE — Progress Notes (Signed)
Patient daughter states he is not eating and not wanting to take medications she believes he is giving up.

## 2021-12-15 ENCOUNTER — Telehealth: Payer: Self-pay | Admitting: Oncology

## 2021-12-15 ENCOUNTER — Other Ambulatory Visit (HOSPITAL_COMMUNITY): Payer: Self-pay

## 2021-12-15 ENCOUNTER — Other Ambulatory Visit: Payer: Self-pay

## 2021-12-15 ENCOUNTER — Telehealth: Payer: Self-pay

## 2021-12-15 ENCOUNTER — Other Ambulatory Visit: Payer: Self-pay | Admitting: Student

## 2021-12-15 DIAGNOSIS — C3412 Malignant neoplasm of upper lobe, left bronchus or lung: Secondary | ICD-10-CM

## 2021-12-15 DIAGNOSIS — C7951 Secondary malignant neoplasm of bone: Secondary | ICD-10-CM

## 2021-12-15 DIAGNOSIS — Z515 Encounter for palliative care: Secondary | ICD-10-CM

## 2021-12-15 DIAGNOSIS — E43 Unspecified severe protein-calorie malnutrition: Secondary | ICD-10-CM

## 2021-12-15 DIAGNOSIS — R630 Anorexia: Secondary | ICD-10-CM

## 2021-12-15 MED ORDER — OSIMERTINIB MESYLATE 80 MG PO TABS: 80.0000 mg | ORAL_TABLET | Freq: Every day | ORAL | 1 refills | Status: DC

## 2021-12-15 NOTE — Telephone Encounter (Signed)
Patient's daughter called and reported that patient has coughed up blood 6 times today, every time he coughs up dime size of blood. Also he has a " fever" of 99.5.  Per daughter, he is not more shortness of breath than his baseline. Patient has stage IV lung cancer, started on Tagrisso.  Explained to patient that a temperature of 99.5 does not meet fever criteria. Recommend patient to go to emergency room for further evaluation for hemoptysis.

## 2021-12-15 NOTE — Progress Notes (Signed)
[facility] Consult Note Telephone: [phone]  Fax: [phone]   Date of encounter: 12/15/21 2:25 PM PATIENT NAME: Jared Mullen [address] [place] [address]   [phone] (home)  DOB: 12/25/1940 MRN: [id number] PRIMARY CARE PROVIDER:    Birdie Sons, MD,  [address] [place] [address] [place] [address] [phone]  REFERRING PROVIDER:   Altha Harm, NP  RESPONSIBLE PARTY:    Contact Information     Name Relation Home Work Mobile   Rex,Tracey Daughter   [phone]   Jerrye Bushy Daughter   [phone]        Due to the COVID-19 crisis, this visit was done via telemedicine from my office and it was initiated and consent by this patient and or family.  I connected with  [name] OR PROXY on 12/15/21 by a video enabled telemedicine application and verified that I am speaking with the correct person using two identifiers.   I discussed the limitations of evaluation and management by telemedicine. The patient expressed understanding and agreed to proceed.                                     ASSESSMENT AND PLAN / RECOMMENDATIONS:   Advance Care Planning/Goals of Care: Goals include to maximize quality of life and symptom management. Patient/health care surrogate gave his/her permission to discuss.Our advance care planning conversation included a discussion about:    The value and importance of advance care planning  Experiences with loved ones who have been seriously ill or have died  Exploration of personal, cultural or spiritual beliefs that might influence medical decisions  Exploration of goals of care in the event of a sudden injury or illness  HCPOA-daughters Simone Curia and Jerrye Bushy golden rod DNR for patient completed; uploaded to [facility]. CODE STATUS: DNR  Education on Palliative Medicine vs. Hospice services. Patient started Tagrisso  today. He would like to see how this treatment  goes.  He and daughter made aware that should he discontinue treatment, he will be eligible for hospice services given his cancer with metastasis. Palliative Medicine will continue to provide symptom management and ongoing support.   I spent 18 minutes providing this consultation. More than 50% of the time in this consultation was spent in counseling and care coordination.  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 He denies any symptoms such as pain, nausea, diarrhea. Will monitor for symptoms; palliative medicine will continue to provide symptom management as needed.   Appetite, protein calorie malnutrition-patient with poor appetite. He states he has stopped Ensures, which was causing some nausea. Encourage good oral hygiene, adequate fluids. Encourage foods patient enjoys, Ensure clear as an option. Discussed medications that can promote appetite, should he continue to not eat well.   Follow up Palliative Care Visit: Palliative care will continue to follow for complex medical decision making, advance care planning, and clarification of goals. Return in 3-4 weeks or prn.   This visit was coded based on medical decision making (MDM).  PPS: 50%  HOSPICE ELIGIBILITY/DIAGNOSIS: TBD  Chief Complaint: Palliative Medicine initial consult.   HISTORY OF PRESENT ILLNESS:  Jared Mullen is a 81 y.o. 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  Patient resides at home;  daughter [name] is in the home. He started Tagrisso today; so far states he is doing okay. He denies pain, nausea, shortness of breath, constipation, diarrhea. Daughter states he will complain of leg pain if he stands too long. Poor appetite; clothes  fitting looser. He states today he was able to eat brunswick stew, apple sauce. He stopped Ensures and buttermilk; states they were upsetting his stomach, nausea. He does require assistance with adl's; assist from family. Uses walker for ambulation. Endorses fatigue. A 10 point ROS is negative, except for the pertinent positives and negatives detailed per the HPI.   History obtained from review of EMR, discussion with primary team, and interview with family, facility staff/caregiver and/or Mr. Chadderdon.  I reviewed available labs, medications, imaging, studies and related documents from the EMR.  Records reviewed and summarized above.   Physical Exam:  Constitutional: NAD General: frail appearing, thin EYES: anicteric sclera, lids intact, no discharge  ENMT: intact hearing Pulmonary: no increased work of breathing, no cough, room air GU: deferred MSK: Sarcopenia, moves all extremities, ambulatory Skin: warm and dry, no rashes or wounds on visible skin Neuro: generalized weakness,  no cognitive impairment Psych: non-anxious affect, A and O x 3 Hem/lymph/immuno: no widespread bruising CURRENT PROBLEM LIST:  Patient Active Problem List   Diagnosis Date Noted   Primary cancer of left upper lobe of lung ([place]) 11/24/2021   Constipation    Goals of care, counseling/discussion    Protein-calorie malnutrition, severe 11/11/2021   Palliative care encounter    Demand ischemia ([place]) 11/10/2021   Elevated troponin    Nausea    Prostate cancer ([place])    Abdominal pain 11/09/2021   Lung mass 11/09/2021   NSTEMI (non-ST elevated myocardial infarction) ([place]) 11/09/2021   Osteopenia 12/15/2020   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   Seborrhea capitis 04/24/2015   H/O adenomatous polyp of colon 11/18/2011   History of prostate cancer 11/04/2011   PAST MEDICAL HISTORY:  Active Ambulatory Problems    Diagnosis Date Noted   History of prostate cancer 11/04/2011   H/O adenomatous polyp of  colon 11/18/2011   Seborrhea capitis 04/24/2015   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   Osteopenia 12/15/2020   Abdominal pain 11/09/2021   Lung mass 11/09/2021   NSTEMI (non-ST elevated myocardial infarction) ([facility] [place]) 11/09/2021   Demand ischemia ([place]) 11/10/2021   Elevated troponin    Nausea    Prostate cancer ([place])    Protein-calorie malnutrition, severe 11/11/2021   Palliative care encounter    Goals of care, counseling/discussion    Constipation    Primary cancer of left upper lobe of lung ([place]) 11/24/2021   Resolved Ambulatory Problems    Diagnosis Date Noted   Encounter for screening for malignant neoplasm of colon 04/24/2015   Encounter for screening for diabetes mellitus 04/24/2015   Past Medical History:  Diagnosis Date   Abnormal ECG    Cataract immature right eye    History of radiation therapy 12/21/11 to 01/24/12   Nocturia    Peyronie's disease    Sleep apnea    Urinary frequency    Weak urinary stream    Weight loss    SOCIAL HX:  Social History   Tobacco Use   Smoking status: Former    Packs/day: 2.00    Years: 28.00    Pack years: 56.00    Types: Cigarettes    Quit date: 10/18/1980    Years since quitting: 41.1   Smokeless  tobacco: Never  Substance Use Topics   Alcohol use: No   FAMILY HX:  Family History  Problem Relation Age of Onset   Heart disease Father    Breast cancer Sister    Cancer Sister        lung   Lung cancer Brother    Cancer Brother        bladder   Lung cancer Sister    Cancer Sister        breast   Kidney disease Brother       ALLERGIES: No Known Allergies   PERTINENT MEDICATIONS:  Outpatient Encounter Medications as of 12/15/2021  Medication Sig   acetaminophen (TYLENOL) 325 MG tablet Take 650 mg by mouth every 6 (six) hours as needed.   atorvastatin (LIPITOR) 80 MG tablet Take 1 tablet (80 mg total) by mouth daily. (Patient not taking: Reported on 12/14/2021)   benzonatate (TESSALON) 100 MG capsule Take 2  capsules (200 mg total) by mouth every 8 (eight) hours. (Patient not taking: Reported on 11/23/2021)   ipratropium (ATROVENT) 0.06 % nasal spray Place 2 sprays into both nostrils 4 (four) times daily. (Patient not taking: Reported on 11/23/2021)   metoprolol tartrate (LOPRESSOR) 25 MG tablet Take 0.5 tablets (12.5 mg total) by mouth 2 (two) times daily. (Patient not taking: Reported on 12/14/2021)   ondansetron (ZOFRAN) 4 MG tablet Take 1 tablet (4 mg total) by mouth every 8 (eight) hours as needed for nausea or vomiting.   prochlorperazine (COMPAZINE) 10 MG tablet Take 1 tablet (10 mg total) by mouth every 6 (six) hours as needed for nausea or vomiting.   traMADol (ULTRAM) 50 MG tablet Take 0.5-1 tablets (25-50 mg total) by mouth every 6 (six) hours as needed. (Patient not taking: Reported on 12/14/2021)   traZODone (DESYREL) 50 MG tablet Take 1-2 tablets (50-100 mg total) by mouth at bedtime.   No facility-administered encounter medications on file as of 12/15/2021.   Thank you for the opportunity to participate in the care of Mr. Venard.  The palliative care team will continue to follow. Please call our office at [phone] if we can be of additional assistance.   Ezekiel Slocumb, NP   COVID-19 PATIENT SCREENING TOOL Asked and negative response unless otherwise noted:  Have you had symptoms of covid, tested positive or been in contact with someone with symptoms/positive test in the past 5-10 days? No

## 2021-12-15 NOTE — Telephone Encounter (Signed)
Oral Chemotherapy Pharmacist Encounter  Successfully signed the patient up for a copay card for Tagrisso (osimertinib).  ID: [id number] BIN: [id number] Group: [id number] PCN: [id number]  Billing information will be shared with [facility]. I will place a copy of the card to be scanned into patient's chart.Benn Moulder, PharmD Pharmacy Resident  12/15/2021 11:38 AM

## 2021-12-16 NOTE — Addendum Note (Signed)
Addended by: Darl Pikes on: 12/16/2021 12:46 PM ? ? Modules accepted: Orders ? ?

## 2021-12-17 NOTE — Progress Notes (Signed)
I connected with Jared Mullen on 12/17/21 at  1:30 PM EST by video enabled telemedicine visit and verified that I am speaking with the correct person using two identifiers.   I discussed the limitations, risks, security and privacy concerns of performing an evaluation and management service by telemedicine and the availability of in-person appointments. I also discussed with the patient that there may be a patient responsible charge related to this service. The patient expressed understanding and agreed to proceed.  Other persons participating in the visit and their role in the encounter:  patients daughter  Patient's location:  home Provider's location:  work  Risk analyst Complaint:   Discuss NGS testing results and further management  History of present illness: Patient is a 81 year old male who was admitted to the hospital in January 2023 with symptoms of generalized weakness cough and ongoing weight loss.  CT chest showed left perihilar upper lobe masslike opacity measuring 6.6 x 5 x 6.1 cm contiguous with the hilum.  Left hilar adenopathy as well as paratracheal adenopathy.  Severe narrowing of the left upper lobe bronchus due to left perihilar mass.  CT abdomen and pelvis with contrast showed large lytic lesion involving the right iliac bone.  MRI brain with and without contrast did not show any evidence of metastatic disease.  Patient underwent bronchoscopy with biopsy of the left hilar mass which was consistent with adenocarcinoma.  NGS testing showed EGFR L858R mutation that could be targetable with Tagrisso.  Tumor mutational burden high.  PD-L1 less than 1%.  No other actionable mutations.  Interval history patient has ongoing fatigue as well as exertional shortness of breath.  Reports intermittent nausea.  Appetite is fair   Review of Systems  Constitutional:  Positive for malaise/fatigue and weight loss. Negative for chills and fever.  HENT:  Negative for congestion, ear discharge and  nosebleeds.   Eyes:  Negative for blurred vision.  Respiratory:  Negative for cough, hemoptysis, sputum production, shortness of breath and wheezing.   Cardiovascular:  Negative for chest pain, palpitations, orthopnea and claudication.  Gastrointestinal:  Positive for nausea. Negative for abdominal pain, blood in stool, constipation, diarrhea, heartburn, melena and vomiting.  Genitourinary:  Negative for dysuria, flank pain, frequency, hematuria and urgency.  Musculoskeletal:  Negative for back pain, joint pain and myalgias.  Skin:  Negative for rash.  Neurological:  Negative for dizziness, tingling, focal weakness, seizures, weakness and headaches.  Endo/Heme/Allergies:  Does not bruise/bleed easily.  Psychiatric/Behavioral:  Negative for depression and suicidal ideas. The patient does not have insomnia.    No Known Allergies  Past Medical History:  Diagnosis Date   Abnormal ECG    a. baseline inferolateral ST elevation dating back to at least 2012.   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   Sleep apnea    Urinary frequency    Weak urinary stream    Weight loss     Past Surgical History:  Procedure Laterality Date   biospy  09/30/2011-   TRUS/Bx Prostate - 5/12/biopsies positive for cancer, glandular size 32.5 cc   CHOLECYSTECTOMY  1992   RADIOACTIVE SEED IMPLANT  02/09/2012   Procedure: RADIOACTIVE SEED IMPLANT;  Surgeon: Franchot Gallo, MD;  Location: [facility];  Service: Urology;  Laterality: N/A;  RAD TECH OK PER ANNE AT MAIN OR  UVULOPALATOPHARYNGOPLASTY     VIDEO BRONCHOSCOPY WITH ENDOBRONCHIAL ULTRASOUND Bilateral 11/12/2021   Procedure: VIDEO BRONCHOSCOPY WITH ENDOBRONCHIAL ULTRASOUND;  Surgeon: Ottie Glazier, MD;  Location: [facility];  Service: Thoracic;  Laterality: Bilateral;    Social History    Socioeconomic History   Marital status: Widowed    Spouse name: Not on file   Number of children: 3   Years of education: Not on file   Highest education level: High school graduate  Occupational History   Occupation: retired  Tobacco Use   Smoking status: Former    Packs/day: 2.00    Years: 28.00    Pack years: 56.00    Types: Cigarettes    Quit date: 10/18/1980    Years since quitting: 41.1   Smokeless tobacco: Never  Substance and Sexual Activity   Alcohol use: No   Drug use: No   Sexual activity: Not on file    Comment: Low libido  Other Topics Concern   Not on file  Social History Narrative   Lives locally.  Was caring for his wife until she passed in December.  Sedentary.   Social Determinants of Health   Financial Resource Strain: Not on file  Food Insecurity: Not on file  Transportation Needs: Not on file  Physical Activity: Not on file  Stress: Not on file  Social Connections: Not on file  Intimate Partner Violence: Not on file    Family History  Problem Relation Age of Onset   Heart disease Father    Breast cancer Sister    Cancer Sister        lung   Lung cancer Brother    Cancer Brother        bladder   Lung cancer Sister    Cancer Sister        breast   Kidney disease Brother      Current Outpatient Medications:    acetaminophen (TYLENOL) 325 MG tablet, Take 650 mg by mouth every 6 (six) hours as needed., Disp: , Rfl:    ondansetron (ZOFRAN) 4 MG tablet, Take 1 tablet (4 mg total) by mouth every 8 (eight) hours as needed for nausea or vomiting., Disp: 45 tablet, Rfl: 0   prochlorperazine (COMPAZINE) 10 MG tablet, Take 1 tablet (10 mg total) by mouth every 6 (six) hours as needed for nausea or vomiting., Disp: 45 tablet, Rfl: 0   traZODone (DESYREL) 50 MG tablet, Take 1-2 tablets (50-100 mg total) by mouth at bedtime., Disp: 45 tablet, Rfl: 0   benzonatate (TESSALON) 100 MG capsule, Take 2 capsules (200 mg total) by mouth every 8 (eight) hours.  (Patient not taking: Reported on 11/23/2021), Disp: 21 capsule, Rfl: 0   ipratropium (ATROVENT) 0.06 % nasal spray, Place 2 sprays into both nostrils 4 (four) times daily. (Patient not taking: Reported on 11/23/2021), Disp: 15 mL, Rfl: 12   osimertinib mesylate (TAGRISSO) 80 MG tablet, Take 1 tablet (80 mg total) by mouth daily., Disp: 30 tablet, Rfl: 1   traMADol (ULTRAM) 50 MG tablet, Take 0.5-1 tablets (25-50 mg total) by mouth every 6 (six) hours as needed. (Patient not taking: Reported on 12/14/2021), Disp: 30 tablet, Rfl: 0  No results found.  No images are attached to the encounter.   CMP Latest Ref Rng & Units 11/16/2021  Glucose 70 - 99 mg/dL 105(H)  BUN 8 - 23 mg/dL 14  Creatinine 0.61 - 1.24 mg/dL 0.87  Sodium 135 - 145 mmol/L 136  Potassium 3.5 - 5.1 mmol/L  4.5  Chloride 98 - 111 mmol/L 99  CO2 22 - 32 mmol/L 30  Calcium 8.9 - 10.3 mg/dL 8.9  Total Protein 6.5 - 8.1 g/dL -  Total Bilirubin 0.3 - 1.2 mg/dL -  Alkaline Phos 38 - 126 U/L -  AST 15 - 41 U/L -  ALT 0 - 44 U/L -   CBC Latest Ref Rng & Units 11/16/2021  WBC 4.0 - 10.5 K/uL 8.6  Hemoglobin 13.0 - 17.0 g/dL 13.2  Hematocrit 39.0 - 52.0 % 40.2  Platelets 150 - 400 K/uL 231     Observation/objective: Appears in no acute distress over video visit today.  Breathing is nonlabored  Assessment and plan: Patient is a 81 year old male with newly diagnosed stage IV metastatic adenocarcinoma of the lung EGFR positive here to discuss NGS testing results and further management  Patient has stage IV disease based on large right iliac lesion.  Patient also has a central/left upper lobe lung mass measuring 6.6 x 5 x 6.1 cm.  This is consistent with stage IV disease.  NGS testing does show EGFR L858R mutation which would be targetable by Tagrisso.  I therefore recommend starting Tagrisso 80 mg daily until progression or toxicity.  He did have baseline echocardiogram while he was hospitalized which is within normal limits.  Patient's  daughter will come to the cancer center to pick up the drug today.  I will check CBC with differential CMP again in 4 weeks and see him thereafter.  Treatment will be given with a palliative intent.  If patient has progression on frontline Tagrisso second line chemo and/or immunotherapy can be considered based on performance status  In the phase III FLAURA trial, 556 patients with treatment-nave, EGFR-mutated advanced NSCLC were randomly assigned to osimertinib versus standard of care (SOC) EGFR TKI (gefitinib or erlotinib) . Neurologically stable patients with central nervous system metastases were permitted in this study. Osimertinib demonstrated improvement in progression-free survival (PFS; 18.9 versus 10.2 months; hazard ratio [HR] 0.46, 95% CI 0.37-0.57) and duration of response (17.2 versus 8.5 months) relative to [facility]. The PFS benefit was consistent across subgroups, including either patients with or without brain metastases. In reporting of overall survival (OS) results at 58 percent maturity, osimertinib improved OS relative to [place] (38.6 versus 31.8 months; HR 0.80, 95% CI 0.64-0.997)    Follow-up instructions: As above  I discussed the assessment and treatment plan with the patient. The patient was provided an opportunity to ask questions and all were answered. The patient agreed with the plan and demonstrated an understanding of the instructions.   The patient was advised to call back or seek an in-person evaluation if the symptoms worsen or if the condition fails to improve as anticipated.   Visit Diagnosis: 1. Insomnia, unspecified type   2. Lung mass     Dr. Randa Evens, MD, MPH [facility] Tel- [phone] 12/17/2021 11:17 AM

## 2021-12-23 ENCOUNTER — Telehealth: Payer: Self-pay | Admitting: *Deleted

## 2021-12-23 NOTE — Telephone Encounter (Signed)
Patient wishes to change today's appointment with [name] NP to a virtual visit. ?

## 2021-12-25 ENCOUNTER — Inpatient Hospital Stay: Payer: Medicare Other | Attending: Hospice and Palliative Medicine | Admitting: Hospice and Palliative Medicine

## 2021-12-25 DIAGNOSIS — R531 Weakness: Secondary | ICD-10-CM | POA: Insufficient documentation

## 2021-12-25 DIAGNOSIS — C3412 Malignant neoplasm of upper lobe, left bronchus or lung: Secondary | ICD-10-CM | POA: Diagnosis not present

## 2021-12-25 DIAGNOSIS — Z79899 Other long term (current) drug therapy: Secondary | ICD-10-CM | POA: Insufficient documentation

## 2021-12-25 DIAGNOSIS — R918 Other nonspecific abnormal finding of lung field: Secondary | ICD-10-CM | POA: Insufficient documentation

## 2021-12-25 DIAGNOSIS — R5383 Other fatigue: Secondary | ICD-10-CM | POA: Insufficient documentation

## 2021-12-25 DIAGNOSIS — Z515 Encounter for palliative care: Secondary | ICD-10-CM | POA: Diagnosis not present

## 2021-12-25 DIAGNOSIS — Z803 Family history of malignant neoplasm of breast: Secondary | ICD-10-CM | POA: Insufficient documentation

## 2021-12-25 DIAGNOSIS — C7951 Secondary malignant neoplasm of bone: Secondary | ICD-10-CM | POA: Insufficient documentation

## 2021-12-25 DIAGNOSIS — Z801 Family history of malignant neoplasm of trachea, bronchus and lung: Secondary | ICD-10-CM | POA: Insufficient documentation

## 2021-12-25 DIAGNOSIS — Z923 Personal history of irradiation: Secondary | ICD-10-CM | POA: Insufficient documentation

## 2021-12-25 DIAGNOSIS — G4730 Sleep apnea, unspecified: Secondary | ICD-10-CM | POA: Insufficient documentation

## 2021-12-25 NOTE — Progress Notes (Signed)
Virtual Visit via Telephone Note ? ?I connected with Jared Mullen on 12/25/21 at 11:30 AM EST by telephone and verified that I am speaking with the correct person using two identifiers. ? ?Location: ?Patient: Home ?Provider: Clinic ?  ?I discussed the limitations, risks, security and privacy concerns of performing an evaluation and management service by telephone and the availability of in person appointments. I also discussed with the patient that there may be a patient responsible charge related to this service. The patient expressed understanding and agreed to proceed. ? ? ?History of Present Illness: ?Jared Mullen is a 81 y.o. male with multiple medical problems including with multiple medical problems including with multiple medical problems including history of prostate cancer, sleep apnea, admitted to the hospital 11/10/2021 for evaluation of progressive weakness and weight loss.  CTA of the chest revealed a left upper lobe lung mass.  CT of the abdomen and pelvis revealed a lytic lesion to the right iliac crest.  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  Palliative care was consulted to help address goals. ?  ?Observations/Objective: ?I called and spoke with patient and daughter by phone.   ? ?Patient has been on Tagrisso about a week and seems to be tolerating reasonably well.  He denies significant changes or concerns.  He does endorse persistent fatigue but this is unchanged in characteristic or severity.  He is ambulating with use of a walker and denies falls.  He denies pain today.  He reports improved sleep on trazodone. ? ?Appetite is reportedly fair to poor.  Encouraged use of oral nutritional supplements. ? 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 ? ?Depression/insomnia -continue trazodone ? ?Follow Up Instructions: ?Telephone visit 1 to 2 months ?  ?I discussed the  assessment and treatment plan with the patient. The patient was provided an opportunity to ask questions and all were answered. The patient agreed with the plan and demonstrated an understanding of the instructions. ?  ?The patient was advised to call back or seek an in-person evaluation if the symptoms worsen or if the condition fails to improve as anticipated. ? ?I provided 5 minutes of non-face-to-face time during this encounter. ? ? ?[name], NP ? ? ?

## 2021-12-29 ENCOUNTER — Encounter: Payer: Self-pay | Admitting: *Deleted

## 2022-01-05 ENCOUNTER — Telehealth: Payer: Self-pay | Admitting: Student

## 2022-01-05 NOTE — Telephone Encounter (Signed)
Palliative NP left message regarding f/u visit that was scheduled via Zoom today; no response per patient/daughter.  ?

## 2022-01-07 ENCOUNTER — Telehealth: Payer: Self-pay | Admitting: *Deleted

## 2022-01-07 ENCOUNTER — Other Ambulatory Visit: Payer: Self-pay | Admitting: *Deleted

## 2022-01-07 MED ORDER — HYDRALAZINE HCL 25 MG PO TABS: 25.0000 mg | ORAL_TABLET | Freq: Four times a day (QID) | ORAL | 0 refills | Status: DC | PRN

## 2022-01-07 MED ORDER — HYDROXYZINE HCL 25 MG PO TABS: 25.0000 mg | ORAL_TABLET | Freq: Four times a day (QID) | ORAL | 0 refills | Status: DC | PRN

## 2022-01-07 NOTE — Telephone Encounter (Signed)
Called the pt and let him know that NP - Josh rec: hydralazine 25 mg q 6 hours as needed. Told him if it does not help in next 2-3 days then call back. He was ok with the plan ?

## 2022-01-07 NOTE — Telephone Encounter (Signed)
I put the wrong drug in for the itching and I called the pharmacy and spoke to Missy and she will cancel the order and I will enter the hydroxyzine for itching ?

## 2022-01-07 NOTE — Telephone Encounter (Signed)
Jared Mullen called reporting that patient is on Tagrisso and patient is itching and has bruised and scratched up his arms because of it. She is asking if medicine can be ordered for the itching. Please advise ?

## 2022-01-13 ENCOUNTER — Inpatient Hospital Stay (HOSPITAL_BASED_OUTPATIENT_CLINIC_OR_DEPARTMENT_OTHER): Payer: Medicare Other | Admitting: Oncology

## 2022-01-13 ENCOUNTER — Inpatient Hospital Stay: Payer: Medicare Other

## 2022-01-13 ENCOUNTER — Encounter: Payer: Self-pay | Admitting: Oncology

## 2022-01-13 ENCOUNTER — Encounter: Payer: Self-pay | Admitting: *Deleted

## 2022-01-13 VITALS — BP 107/69 | HR 75 | Temp 97.2°F | Resp 14 | Wt 140.4 lb

## 2022-01-13 DIAGNOSIS — C7951 Secondary malignant neoplasm of bone: Secondary | ICD-10-CM | POA: Diagnosis not present

## 2022-01-13 DIAGNOSIS — Z801 Family history of malignant neoplasm of trachea, bronchus and lung: Secondary | ICD-10-CM | POA: Diagnosis not present

## 2022-01-13 DIAGNOSIS — C3491 Malignant neoplasm of unspecified part of right bronchus or lung: Secondary | ICD-10-CM | POA: Diagnosis not present

## 2022-01-13 DIAGNOSIS — C3490 Malignant neoplasm of unspecified part of unspecified bronchus or lung: Secondary | ICD-10-CM

## 2022-01-13 DIAGNOSIS — Z923 Personal history of irradiation: Secondary | ICD-10-CM | POA: Diagnosis not present

## 2022-01-13 DIAGNOSIS — G4730 Sleep apnea, unspecified: Secondary | ICD-10-CM | POA: Diagnosis not present

## 2022-01-13 DIAGNOSIS — Z79899 Other long term (current) drug therapy: Secondary | ICD-10-CM | POA: Diagnosis not present

## 2022-01-13 DIAGNOSIS — Z803 Family history of malignant neoplasm of breast: Secondary | ICD-10-CM | POA: Diagnosis not present

## 2022-01-13 DIAGNOSIS — R918 Other nonspecific abnormal finding of lung field: Secondary | ICD-10-CM | POA: Diagnosis not present

## 2022-01-13 DIAGNOSIS — C3412 Malignant neoplasm of upper lobe, left bronchus or lung: Secondary | ICD-10-CM | POA: Diagnosis present

## 2022-01-13 DIAGNOSIS — G4700 Insomnia, unspecified: Secondary | ICD-10-CM

## 2022-01-13 DIAGNOSIS — R531 Weakness: Secondary | ICD-10-CM | POA: Diagnosis not present

## 2022-01-13 DIAGNOSIS — R5383 Other fatigue: Secondary | ICD-10-CM | POA: Diagnosis not present

## 2022-01-13 LAB — CBC WITH DIFFERENTIAL/PLATELET
Abs Immature Granulocytes: 0.01 10*3/uL (ref 0.00–0.07)
Basophils Absolute: 0.1 10*3/uL (ref 0.0–0.1)
Basophils Relative: 1 %
Eosinophils Absolute: 0.5 10*3/uL (ref 0.0–0.5)
Eosinophils Relative: 7 %
HCT: 38.2 % — ABNORMAL LOW (ref 39.0–52.0)
Hemoglobin: 12.5 g/dL — ABNORMAL LOW (ref 13.0–17.0)
Immature Granulocytes: 0 %
Lymphocytes Relative: 14 %
Lymphs Abs: 0.9 10*3/uL (ref 0.7–4.0)
MCH: 28.9 pg (ref 26.0–34.0)
MCHC: 32.7 g/dL (ref 30.0–36.0)
MCV: 88.4 fL (ref 80.0–100.0)
Monocytes Absolute: 0.7 10*3/uL (ref 0.1–1.0)
Monocytes Relative: 11 %
Neutro Abs: 4.1 10*3/uL (ref 1.7–7.7)
Neutrophils Relative %: 67 %
Platelets: 146 10*3/uL — ABNORMAL LOW (ref 150–400)
RBC: 4.32 MIL/uL (ref 4.22–5.81)
RDW: 15.2 % (ref 11.5–15.5)
WBC: 6.2 10*3/uL (ref 4.0–10.5)
nRBC: 0 % (ref 0.0–0.2)

## 2022-01-13 LAB — COMPREHENSIVE METABOLIC PANEL
ALT: 20 U/L (ref 0–44)
AST: 21 U/L (ref 15–41)
Albumin: 3.1 g/dL — ABNORMAL LOW (ref 3.5–5.0)
Alkaline Phosphatase: 74 U/L (ref 38–126)
Anion gap: 5 (ref 5–15)
BUN: 12 mg/dL (ref 8–23)
CO2: 29 mmol/L (ref 22–32)
Calcium: 8.9 mg/dL (ref 8.9–10.3)
Chloride: 100 mmol/L (ref 98–111)
Creatinine, Ser: 0.71 mg/dL (ref 0.61–1.24)
GFR, Estimated: >60 mL/min (ref >60)
Glucose, Bld: 111 mg/dL — ABNORMAL HIGH (ref 70–99)
Potassium: 4.3 mmol/L (ref 3.5–5.1)
Sodium: 134 mmol/L — ABNORMAL LOW (ref 135–145)
Total Bilirubin: 0.7 mg/dL (ref 0.3–1.2)
Total Protein: 7 g/dL (ref 6.5–8.1)

## 2022-01-13 NOTE — Progress Notes (Signed)
? ? ? ?Hematology/Oncology Consult note ?[place]  ?Telephone:([phone]) [phone] [phone] ? ?Patient Care Team: ?Birdie Sons, MD as PCP - General (Family Medicine) ?Minna Merritts, MD as PCP - Cardiology (Cardiology) ?Franchot Gallo, MD as Consulting Physician (Urology) ?Telford Nab, RN as Sales executive  ? ?Name of the patient: Jared Mullen  ?[id number]  ?Jan 11, 1941  ? ?Date of visit: 01/13/22 ? 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 ?Chief complaint/ Reason for visit-routine follow-up of lung cancer on Tagrisso ? ?Heme/Onc history: Patient is a 81 year old male who was admitted to the hospital in January 2023 with symptoms of generalized weakness cough and ongoing weight loss.  CT chest showed left perihilar upper lobe masslike opacity measuring 6.6 x 5 x 6.1 cm contiguous with the hilum.  Left hilar adenopathy as well as paratracheal adenopathy.  Severe narrowing of the left upper lobe bronchus due to left perihilar mass.  CT abdomen and pelvis with contrast showed large lytic lesion involving the right iliac bone.  MRI brain with and without contrast did not show any evidence of metastatic disease.  Patient underwent bronchoscopy with biopsy of the left hilar mass which was consistent with adenocarcinoma. ?  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  Tumor mutational burden high.  PD-L1 less than 1%.  No other actionable mutations. ?  ? ?Interval history-reports tolerating Tagrisso well and other than occasional nausea she denies other complaints at this time.  He is independent of his ADLs.  Reports that his bowel movements are regular ? ?ECOG PS- 2 ?Pain scale- 0 ? ? ?Review of systems- Review of Systems  ?Constitutional:  Positive for malaise/fatigue. Negative for chills, fever and weight loss.  ?HENT:  Negative for congestion, ear discharge and nosebleeds.   ?Eyes:  Negative for blurred  vision.  ?Respiratory:  Negative for cough, hemoptysis, sputum production, shortness of breath and wheezing.   ?Cardiovascular:  Negative for chest pain, palpitations, orthopnea and claudication.  ?Gastrointestinal:  Positive for nausea. Negative for abdominal pain, blood in stool, constipation, diarrhea, heartburn, melena and vomiting.  ?Genitourinary:  Negative for dysuria, flank pain, frequency, hematuria and urgency.  ?Musculoskeletal:  Negative for back pain, joint pain and myalgias.  ?Skin:  Negative for rash.  ?Neurological:  Negative for dizziness, tingling, focal weakness, seizures, weakness and headaches.  ?Endo/Heme/Allergies:  Does not bruise/bleed easily.  ?Psychiatric/Behavioral:  Negative for depression and suicidal ideas. The patient does not have insomnia.    ? ? ? ?No Known Allergies ? ? ?Past Medical History:  ?Diagnosis Date  ? Abnormal ECG   ? a. baseline inferolateral ST elevation dating back to at least 2012.  ? Cataract immature right eye   ? History of radiation therapy 12/21/11 to 01/24/12  ? prostate  ? Nocturia   ? Peyronie's disease   ? Prostate cancer ([place]) dx 09/30/2011  ? Gleason 7  ? Sleep apnea   ? Urinary frequency   ? Weak urinary stream   ? Weight loss   ? ? ? ?Past Surgical History:  ?Procedure Laterality Date  ? biospy  09/30/2011-  ? TRUS/Bx Prostate - 5/12/biopsies positive for cancer, glandular size 32.5 cc  ? CHOLECYSTECTOMY  1992  ? RADIOACTIVE SEED IMPLANT  02/09/2012  ? Procedure: RADIOACTIVE SEED IMPLANT;  Surgeon: Franchot Gallo, MD;  Location: [facility];  Service: Urology;  Laterality: N/A;  RAD TECH OK PER ANNE AT MAIN OR ?  ?  UVULOPALATOPHARYNGOPLASTY    ? VIDEO BRONCHOSCOPY WITH ENDOBRONCHIAL ULTRASOUND Bilateral 11/12/2021  ? Procedure: VIDEO BRONCHOSCOPY WITH ENDOBRONCHIAL ULTRASOUND;  Surgeon: Ottie Glazier, MD;  Location: [facility];  Service: Thoracic;  Laterality: Bilateral;  ? ? ?Social History  ? ?Socioeconomic History  ? Marital status:  Widowed  ?  Spouse name: Not on file  ? Number of children: 3  ? Years of education: Not on file  ? Highest education level: High school graduate  ?Occupational History  ? Occupation: retired  ?Tobacco Use  ? Smoking status: Former  ?  Packs/day: 2.00  ?  Years: 28.00  ?  Pack years: 56.00  ?  Types: Cigarettes  ?  Quit date: 10/18/1980  ?  Years since quitting: 41.2  ? Smokeless tobacco: Never  ?Substance and Sexual Activity  ? Alcohol use: No  ? Drug use: No  ? Sexual activity: Not on file  ?  Comment: Low libido  ?Other Topics Concern  ? Not on file  ?Social History Narrative  ? Lives locally.  Was caring for his wife until she passed in December.  Sedentary.  ? ?Social Determinants of Health  ? ?Financial Resource Strain: Not on file  ?Food Insecurity: Not on file  ?Transportation Needs: Not on file  ?Physical Activity: Not on file  ?Stress: Not on file  ?Social Connections: Not on file  ?Intimate Partner Violence: Not on file  ? ? ?Family History  ?Problem Relation Age of Onset  ? Heart disease Father   ? Breast cancer Sister   ? Cancer Sister   ?     lung  ? Lung cancer Brother   ? Cancer Brother   ?     bladder  ? Lung cancer Sister   ? Cancer Sister   ?     breast  ? Kidney disease Brother   ? ? ? ?Current Outpatient Medications:  ?  acetaminophen (TYLENOL) 325 MG tablet, Take 650 mg by mouth every 6 (six) hours as needed., Disp: , Rfl:  ?  hydrOXYzine (ATARAX) 25 MG tablet, Take 1 tablet (25 mg total) by mouth every 6 (six) hours as needed., Disp: 30 tablet, Rfl: 0 ?  lactulose (CHRONULAC) 10 GM/15ML solution, TAKE 15MLS BY MOUTH ONCE DAILY FOR 10 DAYS, Disp: , Rfl:  ?  ondansetron (ZOFRAN) 4 MG tablet, Take 1 tablet (4 mg total) by mouth every 8 (eight) hours as needed for nausea or vomiting., Disp: 45 tablet, Rfl: 0 ?  osimertinib mesylate (TAGRISSO) 80 MG tablet, Take 1 tablet (80 mg total) by mouth daily., Disp: 30 tablet, Rfl: 1 ?  prochlorperazine (COMPAZINE) 10 MG tablet, Take 1 tablet (10 mg total) by  mouth every 6 (six) hours as needed for nausea or vomiting., Disp: 45 tablet, Rfl: 0 ?  benzonatate (TESSALON) 100 MG capsule, Take 2 capsules (200 mg total) by mouth every 8 (eight) hours. (Patient not taking: Reported on 11/23/2021), Disp: 21 capsule, Rfl: 0 ?  ipratropium (ATROVENT) 0.06 % nasal spray, Place 2 sprays into both nostrils 4 (four) times daily. (Patient not taking: Reported on 11/23/2021), Disp: 15 mL, Rfl: 12 ?  mirtazapine (REMERON) 7.5 MG tablet, Take by mouth. (Patient not taking: Reported on 01/13/2022), Disp: , Rfl:  ?  traMADol (ULTRAM) 50 MG tablet, Take 0.5-1 tablets (25-50 mg total) by mouth every 6 (six) hours as needed. (Patient not taking: Reported on 12/14/2021), Disp: 30 tablet, Rfl: 0 ?  traZODone (DESYREL) 50 MG tablet, Take 1-2 tablets (50-100  mg total) by mouth at bedtime. (Patient not taking: Reported on 01/13/2022), Disp: 45 tablet, Rfl: 0 ? ?Physical exam:  ?Vitals:  ? 01/13/22 1458  ?BP: 107/69  ?Pulse: 75  ?Resp: 14  ?Temp: (!) 97.2 ?F (36.2 ?C)  ?SpO2: 96%  ?Weight: 140 lb 6.4 oz (63.7 kg)  ? ?Physical Exam ?Constitutional:   ?   Comments: He is sitting in a wheelchair.  Appears in no acute distress  ?Cardiovascular:  ?   Rate and Rhythm: Normal rate and regular rhythm.  ?   Heart sounds: Normal heart sounds.  ?Pulmonary:  ?   Effort: Pulmonary effort is normal.  ?   Breath sounds: Normal breath sounds.  ?Abdominal:  ?   General: Bowel sounds are normal.  ?   Palpations: Abdomen is soft.  ?Skin: ?   General: Skin is warm and dry.  ?Neurological:  ?   Mental Status: He is alert and oriented to person, place, and time.  ?  ? ? ?  Latest Ref Rng & Units 01/13/2022  ?  2:18 PM  ?CMP  ?Glucose 70 - 99 mg/dL 111    ?BUN 8 - 23 mg/dL 12    ?Creatinine 0.61 - 1.24 mg/dL 0.71    ?Sodium 135 - 145 mmol/L 134    ?Potassium 3.5 - 5.1 mmol/L 4.3    ?Chloride 98 - 111 mmol/L 100    ?CO2 22 - 32 mmol/L 29    ?Calcium 8.9 - 10.3 mg/dL 8.9    ?Total Protein 6.5 - 8.1 g/dL 7.0    ?Total Bilirubin 0.3  - 1.2 mg/dL 0.7    ?Alkaline Phos 38 - 126 U/L 74    ?AST 15 - 41 U/L 21    ?ALT 0 - 44 U/L 20    ? ? ?  Latest Ref Rng & Units 01/13/2022  ?  2:18 PM  ?CBC  ?WBC 4.0 - 10.5 K/uL 6.2    ?Hemoglobin 13.0 - 17.0

## 2022-01-13 NOTE — Progress Notes (Signed)
Nutrition ? ?Patient scheduled for nutrition visit after MD visit today but patient declined staying for nutrition visit.  Ready to go home per nursing.  ? ?Discussed with nursing to have scheduling, reschedule patient if patient desires.   ? ?RD available if needed.  ? ?Jared Mullen, RD, LDN ?Registered Dietitian ?[phone] ? ?

## 2022-01-25 ENCOUNTER — Other Ambulatory Visit (HOSPITAL_COMMUNITY): Payer: Self-pay

## 2022-02-02 ENCOUNTER — Telehealth: Payer: Self-pay | Admitting: *Deleted

## 2022-02-02 NOTE — Telephone Encounter (Signed)
Please call him and have him seen at [facility] ?

## 2022-02-02 NOTE — Telephone Encounter (Signed)
Message from late yesterday evening. Jared Mullen called reporting that patient went out to pull weeds yesterday and became light headed and dizzy. He was assisted into a wheelchair and brought into the garage where he then passed out. EMS was called and patient refused to g to ER. She reports that his vital signs were stable. She is asking if something needs to be done or if he needs to be seen. Please adivse. ?

## 2022-02-02 NOTE — Telephone Encounter (Signed)
Called and spoke to [place]. She reports that pt is feeling much better, and thought that he had gotten too hot. I offered a visit in [facility]. [name] could be heard asking pt if he wanted to be seen today, but he declined, stating that he is feeling much better. Encouraged to call back if symptoms returned.  ?

## 2022-02-10 ENCOUNTER — Inpatient Hospital Stay (HOSPITAL_BASED_OUTPATIENT_CLINIC_OR_DEPARTMENT_OTHER): Payer: Medicare Other | Admitting: Oncology

## 2022-02-10 ENCOUNTER — Encounter: Payer: Self-pay | Admitting: Oncology

## 2022-02-10 ENCOUNTER — Inpatient Hospital Stay: Payer: Medicare Other | Attending: Oncology

## 2022-02-10 VITALS — BP 117/73 | HR 82 | Temp 96.4°F | Resp 16 | Wt 146.6 lb

## 2022-02-10 DIAGNOSIS — C3490 Malignant neoplasm of unspecified part of unspecified bronchus or lung: Secondary | ICD-10-CM

## 2022-02-10 DIAGNOSIS — Z923 Personal history of irradiation: Secondary | ICD-10-CM | POA: Diagnosis not present

## 2022-02-10 DIAGNOSIS — Z8546 Personal history of malignant neoplasm of prostate: Secondary | ICD-10-CM | POA: Diagnosis not present

## 2022-02-10 DIAGNOSIS — C7951 Secondary malignant neoplasm of bone: Secondary | ICD-10-CM | POA: Diagnosis not present

## 2022-02-10 DIAGNOSIS — C3491 Malignant neoplasm of unspecified part of right bronchus or lung: Secondary | ICD-10-CM | POA: Insufficient documentation

## 2022-02-10 DIAGNOSIS — Z801 Family history of malignant neoplasm of trachea, bronchus and lung: Secondary | ICD-10-CM | POA: Diagnosis not present

## 2022-02-10 DIAGNOSIS — Z87891 Personal history of nicotine dependence: Secondary | ICD-10-CM | POA: Diagnosis not present

## 2022-02-10 DIAGNOSIS — Z803 Family history of malignant neoplasm of breast: Secondary | ICD-10-CM | POA: Diagnosis not present

## 2022-02-10 DIAGNOSIS — G4730 Sleep apnea, unspecified: Secondary | ICD-10-CM | POA: Diagnosis not present

## 2022-02-10 DIAGNOSIS — Z79899 Other long term (current) drug therapy: Secondary | ICD-10-CM | POA: Insufficient documentation

## 2022-02-10 LAB — CBC WITH DIFFERENTIAL/PLATELET
Abs Immature Granulocytes: 0.01 10*3/uL (ref 0.00–0.07)
Basophils Absolute: 0 10*3/uL (ref 0.0–0.1)
Basophils Relative: 1 %
Eosinophils Absolute: 0.3 10*3/uL (ref 0.0–0.5)
Eosinophils Relative: 4 %
HCT: 37.2 % — ABNORMAL LOW (ref 39.0–52.0)
Hemoglobin: 12.1 g/dL — ABNORMAL LOW (ref 13.0–17.0)
Immature Granulocytes: 0 %
Lymphocytes Relative: 13 %
Lymphs Abs: 0.8 10*3/uL (ref 0.7–4.0)
MCH: 29.2 pg (ref 26.0–34.0)
MCHC: 32.5 g/dL (ref 30.0–36.0)
MCV: 89.9 fL (ref 80.0–100.0)
Monocytes Absolute: 0.9 10*3/uL (ref 0.1–1.0)
Monocytes Relative: 14 %
Neutro Abs: 4.3 10*3/uL (ref 1.7–7.7)
Neutrophils Relative %: 68 %
Platelets: 164 10*3/uL (ref 150–400)
RBC: 4.14 MIL/uL — ABNORMAL LOW (ref 4.22–5.81)
RDW: 16.6 % — ABNORMAL HIGH (ref 11.5–15.5)
WBC: 6.2 10*3/uL (ref 4.0–10.5)
nRBC: 0 % (ref 0.0–0.2)

## 2022-02-10 LAB — COMPREHENSIVE METABOLIC PANEL
ALT: 17 U/L (ref 0–44)
AST: 19 U/L (ref 15–41)
Albumin: 3.4 g/dL — ABNORMAL LOW (ref 3.5–5.0)
Alkaline Phosphatase: 71 U/L (ref 38–126)
Anion gap: 6 (ref 5–15)
BUN: 15 mg/dL (ref 8–23)
CO2: 29 mmol/L (ref 22–32)
Calcium: 8.9 mg/dL (ref 8.9–10.3)
Chloride: 97 mmol/L — ABNORMAL LOW (ref 98–111)
Creatinine, Ser: 0.68 mg/dL (ref 0.61–1.24)
GFR, Estimated: >60 mL/min (ref >60)
Glucose, Bld: 121 mg/dL — ABNORMAL HIGH (ref 70–99)
Potassium: 4.1 mmol/L (ref 3.5–5.1)
Sodium: 132 mmol/L — ABNORMAL LOW (ref 135–145)
Total Bilirubin: 0.8 mg/dL (ref 0.3–1.2)
Total Protein: 7.7 g/dL (ref 6.5–8.1)

## 2022-02-11 NOTE — Progress Notes (Signed)
? ? ? ?Hematology/Oncology Consult note ?[place]  ?Telephone:(336) [phone] [phone] ? ?Patient Care Team: ?Birdie Sons, MD as PCP - General (Family Medicine) ?Minna Merritts, MD as PCP - Cardiology (Cardiology) ?Franchot Gallo, MD as Consulting Physician (Urology) ?Telford Nab, RN as Sales executive  ? ?Name of the patient: Jared Mullen  ?[id number]  ?[birth year]  ? ?Date of visit: 02/11/22 ? 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 ?Chief complaint/ Reason for visit- routine f/u of lung cancer on tagrisso ? ?Heme/Onc history: Patient is a 81 year old male who was admitted to the hospital in January 2023 with symptoms of generalized weakness cough and ongoing weight loss.  CT chest showed left perihilar upper lobe masslike opacity measuring 6.6 x 5 x 6.1 cm contiguous with the hilum.  Left hilar adenopathy as well as paratracheal adenopathy.  Severe narrowing of the left upper lobe bronchus due to left perihilar mass.  CT abdomen and pelvis with contrast showed large lytic lesion involving the right iliac bone.  MRI brain with and without contrast did not show any evidence of metastatic disease.  Patient underwent bronchoscopy with biopsy of the left hilar mass which was consistent with adenocarcinoma. ?  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  Tumor mutational burden high.  PD-L1 less than 1%.  No other actionable mutations. ?  ? ?Interval history- tolerating tagrisso well. Nausea has reolved. Denies anyhip pain. He has been going out for walks. Appetite is fair. He has gained 6 lb ? ?ECOG PS- 2 ?Pain scale- 0 ? ?Review of systems- Review of Systems  ?Constitutional:  Positive for malaise/fatigue. Negative for chills, fever and weight loss.  ?HENT:  Negative for congestion, ear discharge and nosebleeds.   ?Eyes:  Negative for blurred vision.  ?Respiratory:  Negative for cough, hemoptysis,  sputum production, shortness of breath and wheezing.   ?Cardiovascular:  Negative for chest pain, palpitations, orthopnea and claudication.  ?Gastrointestinal:  Negative for abdominal pain, blood in stool, constipation, diarrhea, heartburn, melena, nausea and vomiting.  ?Genitourinary:  Negative for dysuria, flank pain, frequency, hematuria and urgency.  ?Musculoskeletal:  Negative for back pain, joint pain and myalgias.  ?Skin:  Negative for rash.  ?Neurological:  Negative for dizziness, tingling, focal weakness, seizures, weakness and headaches.  ?Endo/Heme/Allergies:  Does not bruise/bleed easily.  ?Psychiatric/Behavioral:  Negative for depression and suicidal ideas. The patient does not have insomnia.    ? ? ?No Known Allergies ? ? ?Past Medical History:  ?Diagnosis Date  ? Abnormal ECG   ? a. baseline inferolateral ST elevation dating back to at least 2012.  ? Cataract immature right eye   ? History of radiation therapy 12/21/11 to 01/24/12  ? prostate  ? Nocturia   ? Peyronie's disease   ? 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 Sleep apnea   ? Urinary frequency   ? Weak urinary stream   ? Weight loss   ? ? ? ?Past Surgical History:  ?Procedure Laterality Date  ? biospy  09/30/2011-  ? TRUS/Bx Prostate - 5/12/biopsies positive for cancer, glandular size 32.5 cc  ? CHOLECYSTECTOMY  1992  ? RADIOACTIVE SEED IMPLANT  02/09/2012  ? Procedure: RADIOACTIVE SEED IMPLANT;  Surgeon: Franchot Gallo, MD;  Location: [facility];  Service: Urology;  Laterality: N/A;  RAD TECH OK PER ANNE AT MAIN OR ?  ? UVULOPALATOPHARYNGOPLASTY    ? VIDEO BRONCHOSCOPY WITH  ENDOBRONCHIAL ULTRASOUND Bilateral 11/12/2021  ? Procedure: VIDEO BRONCHOSCOPY WITH ENDOBRONCHIAL ULTRASOUND;  Surgeon: Ottie Glazier, MD;  Location: [facility];  Service: Thoracic;  Laterality: Bilateral;  ? ? ?Social History  ? ?Socioeconomic History  ? Marital status: Widowed  ?  Spouse name: Not on file  ? Number of children: 3  ? Years of  education: Not on file  ? Highest education level: High school graduate  ?Occupational History  ? Occupation: retired  ?Tobacco Use  ? Smoking status: Former  ?  Packs/day: 2.00  ?  Years: 28.00  ?  Pack years: 56.00  ?  Types: Cigarettes  ?  Quit date: 10/18/1980  ?  Years since quitting: 41.3  ? Smokeless tobacco: Never  ?Substance and Sexual Activity  ? Alcohol use: No  ? Drug use: No  ? Sexual activity: Not on file  ?  Comment: Low libido  ?Other Topics Concern  ? Not on file  ?Social History Narrative  ? Lives locally.  Was caring for his wife until she passed in December.  Sedentary.  ? ?Social Determinants of Health  ? ?Financial Resource Strain: Not on file  ?Food Insecurity: Not on file  ?Transportation Needs: Not on file  ?Physical Activity: Not on file  ?Stress: Not on file  ?Social Connections: Not on file  ?Intimate Partner Violence: Not on file  ? ? ?Family History  ?Problem Relation Age of Onset  ? Heart disease Father   ? Breast cancer Sister   ? Cancer Sister   ?     lung  ? Lung cancer Brother   ? Cancer Brother   ?     bladder  ? Lung cancer Sister   ? Cancer Sister   ?     breast  ? Kidney disease Brother   ? ? ? ?Current Outpatient Medications:  ?  hydrOXYzine (ATARAX) 25 MG tablet, Take 1 tablet (25 mg total) by mouth every 6 (six) hours as needed., Disp: 30 tablet, Rfl: 0 ?  osimertinib mesylate (TAGRISSO) 80 MG tablet, Take 1 tablet (80 mg total) by mouth daily., Disp: 30 tablet, Rfl: 1 ?  acetaminophen (TYLENOL) 325 MG tablet, Take 650 mg by mouth every 6 (six) hours as needed. (Patient not taking: Reported on 02/10/2022), Disp: , Rfl:  ?  benzonatate (TESSALON) 100 MG capsule, Take 2 capsules (200 mg total) by mouth every 8 (eight) hours. (Patient not taking: Reported on 11/23/2021), Disp: 21 capsule, Rfl: 0 ?  ipratropium (ATROVENT) 0.06 % nasal spray, Place 2 sprays into both nostrils 4 (four) times daily. (Patient not taking: Reported on 11/23/2021), Disp: 15 mL, Rfl: 12 ?  mirtazapine  (REMERON) 7.5 MG tablet, Take by mouth. (Patient not taking: Reported on 01/13/2022), Disp: , Rfl:  ?  ondansetron (ZOFRAN) 4 MG tablet, Take 1 tablet (4 mg total) by mouth every 8 (eight) hours as needed for nausea or vomiting. (Patient not taking: Reported on 02/10/2022), Disp: 45 tablet, Rfl: 0 ?  prochlorperazine (COMPAZINE) 10 MG tablet, Take 1 tablet (10 mg total) by mouth every 6 (six) hours as needed for nausea or vomiting. (Patient not taking: Reported on 02/10/2022), Disp: 45 tablet, Rfl: 0 ?  traMADol (ULTRAM) 50 MG tablet, Take 0.5-1 tablets (25-50 mg total) by mouth every 6 (six) hours as needed. (Patient not taking: Reported on 12/14/2021), Disp: 30 tablet, Rfl: 0 ?  traZODone (DESYREL) 50 MG tablet, Take 1-2 tablets (50-100 mg total) by mouth at bedtime. (Patient not taking: Reported  on 01/13/2022), Disp: 45 tablet, Rfl: 0 ? ?Physical exam:  ?Vitals:  ? 02/10/22 1541  ?BP: 117/73  ?Pulse: 82  ?Resp: 16  ?Temp: (!) 96.4 ?F (35.8 ?C)  ?SpO2: 95%  ?Weight: 146 lb 9.6 oz (66.5 kg)  ? ?Physical Exam ?Constitutional:   ?   General: He is not in acute distress. ?Cardiovascular:  ?   Rate and Rhythm: Normal rate and regular rhythm.  ?   Heart sounds: Normal heart sounds.  ?Pulmonary:  ?   Effort: Pulmonary effort is normal.  ?   Breath sounds: Normal breath sounds.  ?Abdominal:  ?   General: Bowel sounds are normal.  ?   Palpations: Abdomen is soft.  ?Skin: ?   General: Skin is warm and dry.  ?Neurological:  ?   Mental Status: He is alert and oriented to person, place, and time.  ?  ? ? ?  Latest Ref Rng & Units 02/10/2022  ?  3:26 PM  ?CMP  ?Glucose 70 - 99 mg/dL 121    ?BUN 8 - 23 mg/dL 15    ?Creatinine 0.61 - 1.24 mg/dL 0.68    ?Sodium 135 - 145 mmol/L 132    ?Potassium 3.5 - 5.1 mmol/L 4.1    ?Chloride 98 - 111 mmol/L 97    ?CO2 22 - 32 mmol/L 29    ?Calcium 8.9 - 10.3 mg/dL 8.9    ?Total Protein 6.5 - 8.1 g/dL 7.7    ?Total Bilirubin 0.3 - 1.2 mg/dL 0.8    ?Alkaline Phos 38 - 126 U/L 71    ?AST 15 - 41 U/L 19     ?ALT 0 - 44 U/L 17    ? ? ?  Latest Ref Rng & Units 02/10/2022  ?  3:26 PM  ?CBC  ?WBC 4.0 - 10.5 K/uL 6.2    ?Hemoglobin 13.0 - 17.0 g/dL 12.1    ?Hematocrit 39.0 - 52.0 % 37.2    ?Platelets 150 - 400 K/uL 164

## 2022-02-19 ENCOUNTER — Inpatient Hospital Stay: Payer: Medicare Other | Admitting: Hospice and Palliative Medicine

## 2022-02-22 ENCOUNTER — Inpatient Hospital Stay: Payer: Medicare Other | Attending: Hospice and Palliative Medicine | Admitting: Hospice and Palliative Medicine

## 2022-02-22 DIAGNOSIS — Z515 Encounter for palliative care: Secondary | ICD-10-CM

## 2022-02-22 NOTE — Progress Notes (Signed)
I spoke with patient's daughter by phone.  Patient was sleeping.  Daughter feels like patient is doing reasonably well.  Appetite is improved and patient has gained weight.  Patient has some generalized itching.  Recommended trial an antihistamine.  Reviewed upcoming appointments including CT scan on 5/26 and appoint with Dr. Janese Banks on 5/31.  Will reschedule telephone visit in 1 to 2 months. ?

## 2022-02-26 ENCOUNTER — Other Ambulatory Visit: Payer: Self-pay | Admitting: Oncology

## 2022-02-26 DIAGNOSIS — C3412 Malignant neoplasm of upper lobe, left bronchus or lung: Secondary | ICD-10-CM

## 2022-02-26 NOTE — Telephone Encounter (Signed)
CBC with Differential/Platelet ?Order: [id number] ?Status: Final result    ?Visible to patient: Yes (seen)    ?Next appt: 03/12/2022 at 11:00 AM in Radiology ([facility])    ?Dx: Primary malignant neoplasm of right l...    ?0 Result Notes ?          ?Component Ref Range & Units 2 wk ago ?(02/10/22) 1 mo ago ?(01/13/22) 3 mo ago ?(11/16/21) 3 mo ago ?(11/15/21) 3 mo ago ?(11/14/21) 3 mo ago ?(11/13/21) 3 mo ago ?(11/12/21)  ?WBC 4.0 - 10.5 K/uL 6.2  6.2  8.6  9.8  10.7 High   8.0  8.0   ?RBC 4.22 - 5.81 MIL/uL 4.14 Low   4.32  4.46  4.54  4.46  4.64  4.37   ?Hemoglobin 13.0 - 17.0 g/dL 12.1 Low   12.5 Low   13.2  13.3  13.0  13.5  12.8 Low    ?HCT 39.0 - 52.0 % 37.2 Low   38.2 Low   40.2  40.0  39.4  40.4  38.5 Low    ?MCV 80.0 - 100.0 fL 89.9  88.4  90.1  88.1  88.3  87.1  88.1   ?MCH 26.0 - 34.0 pg 29.2  28.9  29.6  29.3  29.1  29.1  29.3   ?MCHC 30.0 - 36.0 g/dL 32.5  32.7  32.8  33.3  33.0  33.4  33.2   ?RDW 11.5 - 15.5 % 16.6 High   15.2  13.3  13.5  13.5  13.3  13.6   ?Platelets 150 - 400 K/uL 164  146 Low   231  231  230  228  205   ?nRBC 0.0 - 0.2 % 0.0  0.0  0.0 CM  0.0 CM  0.0 CM  0.0 CM  0.0 CM   ?Neutrophils Relative % % 68  67        ?Neutro Abs 1.7 - 7.7 K/uL 4.3  4.1        ?Lymphocytes Relative % 13  14        ?Lymphs Abs 0.7 - 4.0 K/uL 0.8  0.9        ?Monocytes Relative % 14  11        ?Monocytes Absolute 0.1 - 1.0 K/uL 0.9  0.7        ?Eosinophils Relative % 4  7        ?Eosinophils Absolute 0.0 - 0.5 K/uL 0.3  0.5        ?Basophils Relative % 1  1        ?Basophils Absolute 0.0 - 0.1 K/uL 0.0  0.1        ?Immature Granulocytes % 0  0        ?Abs Immature Granulocytes 0.00 - 0.07 K/uL 0.01  0.01 CM        ?Comment: Performed at [facility], [address] [place], [place] [address]  ?Resulting Agency  [place] [facility] [place] [facility] [place] [facility] [place] [facility] [place] [facility] [place] [facility] [place] [facility]  ?  ? ?  ?  ?Specimen Collected: 02/10/22 15:26 Last Resulted: 02/10/22 15:45  ?  ?  Lab Flowsheet   ? Order  Details   ? View Encounter   ? Lab and Collection Details   ? Routing   ? Result History    ?View Encounter Conversation    ?  ?CM=Additional comments    ?  ?Result Care  Coordination ? ? ?Patient Communication ? ? Add Comments   Seen Back to Top  ?  ?  ? ?Other Results from 02/10/2022 ? ? Contains abnormal data Comprehensive metabolic panel ?Order: [id number] ?Status: Final result    ?Visible to patient: Yes (seen)    ?Next appt: 03/12/2022 at 11:00 AM in Radiology ([facility])    ?Dx: Primary malignant neoplasm of right l...    ?0 Result Notes ?          ?Component Ref Range & Units 2 wk ago ?(02/10/22) 1 mo ago ?(01/13/22) 3 mo ago ?(11/16/21) 3 mo ago ?(11/15/21) 3 mo ago ?(11/14/21) 3 mo ago ?(11/13/21) 3 mo ago ?(11/12/21)  ?Sodium 135 - 145 mmol/L 132 Low   134 Low   136  137  135  134 Low   134 Low    ?Potassium 3.5 - 5.1 mmol/L 4.1  4.3  4.5  4.3  4.0  4.6  4.1   ?Chloride 98 - 111 mmol/L 97 Low   100  99  99  101  101  99   ?CO2 22 - 32 mmol/L 29  29  30  29  28  27  28    ?Glucose, Bld 70 - 99 mg/dL 121 High   111 High  CM  105 High  CM  111 High  CM  111 High  CM  177 High  CM  116 High  CM   ?Comment: Glucose reference range applies only to samples taken after fasting for at least 8 hours.  ?BUN 8 - 23 mg/dL 15  12  14  22  24  High   21  14   ?Creatinine, Ser 0.61 - 1.24 mg/dL 0.68  0.71  0.87  0.91  0.62  0.75  0.75   ?Calcium 8.9 - 10.3 mg/dL 8.9  8.9  8.9  9.5  9.1  9.3  9.0   ?Total Protein 6.5 - 8.1 g/dL 7.7  7.0        ?Albumin 3.5 - 5.0 g/dL 3.4 Low   3.1 Low         ?AST 15 - 41 U/L 19  21        ?ALT 0 - 44 U/L 17  20        ?Alkaline Phosphatase 38 - 126 U/L 71  74        ?Total Bilirubin 0.3 - 1.2 mg/dL 0.8  0.7        ?GFR, Estimated >60 mL/min >60  >60 CM  >60 CM  >60 CM  >60 CM  >60 CM  >60 CM   ?Comment: (NOTE)  ?Calculated using the CKD-EPI Creatinine Equation (2021)   ?Anion gap 5 - 15 6  5  CM  7 CM  9 CM  6 CM  6 CM  7 CM   ?Comment: Performed at [facility], [address]  [place], [place] [address]  ?Resulting Agency  [place] [facility] [place] [facility] [place] [facility] [place] [facility] [place] [facility] [place] [facility] [name] [facility]  ?  ? ?  ?  ?Specimen Collected: 02/10/22 15:26 Last Resulted: 02/10/22 16:01  ?  ?  ? ?

## 2022-03-05 ENCOUNTER — Telehealth: Payer: Self-pay | Admitting: *Deleted

## 2022-03-05 NOTE — Telephone Encounter (Signed)
CAll returned to [facility] and informed of doctor response, She thanked me for calling back and letting her know

## 2022-03-05 NOTE — Telephone Encounter (Signed)
Patient daughter called reporting hat patient is having pain in his right arm and hand and that he is having difficulty using them and wants to know if the CT scan patient is having will show if he has cancer there causing this problem. Please advise

## 2022-03-05 NOTE — Telephone Encounter (Signed)
It can show what it going on the pain but if it doesn't and problem persists, he will need MRI spine. He should call if pain worsens over next few days

## 2022-03-12 ENCOUNTER — Encounter
Admission: RE | Admit: 2022-03-12 | Discharge: 2022-03-12 | Disposition: A | Discharge status: Home / Self Care | Payer: Medicare Other | Source: Ambulatory Visit | Attending: Oncology | Admitting: Oncology

## 2022-03-12 DIAGNOSIS — C3491 Malignant neoplasm of unspecified part of right bronchus or lung: Secondary | ICD-10-CM

## 2022-03-12 DIAGNOSIS — C7951 Secondary malignant neoplasm of bone: Secondary | ICD-10-CM | POA: Diagnosis not present

## 2022-03-12 MED ORDER — IOHEXOL 300 MG/ML  SOLN
100.0000 mL | Freq: Once | INTRAMUSCULAR | Status: AC | PRN
  Administered 2022-03-12: 100 mL via INTRAVENOUS

## 2022-03-12 MED ORDER — TECHNETIUM TC 99M MEDRONATE IV KIT
20.0000 | PACK | Freq: Once | INTRAVENOUS | Status: AC | PRN
  Administered 2022-03-12: 20.52 via INTRAVENOUS

## 2022-03-16 ENCOUNTER — Telehealth: Payer: Self-pay | Admitting: *Deleted

## 2022-03-16 NOTE — Telephone Encounter (Signed)
Called asking for results, patient has appointment Friday to go over results  IMPRESSION: Improving central left upper lobe mass, corresponding to the patient's known primary bronchogenic neoplasm, with surrounding radiation changes. Associated thoracic lymphadenopathy, mildly improved.   Interval progression of a 2.7 cm satellite nodule in the anterior left upper lobe.   Improving lytic metastasis in the right iliac bone.   Additional ancillary findings as above.     Electronically Signed   By: Julian Hy M.D.   On: 03/15/2022 00:48  IMPRESSION: Large focus of increased tracer uptake at RIGHT iliac bone consistent with osseous metastatic disease and corresponding to lytic lesion on CT.   No additional concerning scintigraphic findings.     Electronically Signed   By: Lavonia Dana M.D.   On: 03/13/2022 14:45

## 2022-03-17 ENCOUNTER — Inpatient Hospital Stay: Payer: Medicare Other

## 2022-03-17 ENCOUNTER — Inpatient Hospital Stay: Payer: Medicare Other | Admitting: Oncology

## 2022-03-17 NOTE — Telephone Encounter (Signed)
Call returned to [place], got voice mail. Eft message that doctor will go over results on Friday at his scheduled appointment.

## 2022-03-19 ENCOUNTER — Inpatient Hospital Stay (HOSPITAL_BASED_OUTPATIENT_CLINIC_OR_DEPARTMENT_OTHER): Payer: Medicare Other | Admitting: Oncology

## 2022-03-19 ENCOUNTER — Inpatient Hospital Stay: Payer: Medicare Other | Attending: Hospice and Palliative Medicine

## 2022-03-19 ENCOUNTER — Encounter: Payer: Self-pay | Admitting: Oncology

## 2022-03-19 VITALS — BP 103/71 | HR 79 | Temp 97.6°F | Resp 16 | Wt 150.0 lb

## 2022-03-19 DIAGNOSIS — C7951 Secondary malignant neoplasm of bone: Secondary | ICD-10-CM | POA: Diagnosis not present

## 2022-03-19 DIAGNOSIS — Z79899 Other long term (current) drug therapy: Secondary | ICD-10-CM | POA: Diagnosis not present

## 2022-03-19 DIAGNOSIS — G4730 Sleep apnea, unspecified: Secondary | ICD-10-CM | POA: Insufficient documentation

## 2022-03-19 DIAGNOSIS — C3491 Malignant neoplasm of unspecified part of right bronchus or lung: Secondary | ICD-10-CM

## 2022-03-19 DIAGNOSIS — M25521 Pain in right elbow: Secondary | ICD-10-CM | POA: Diagnosis not present

## 2022-03-19 DIAGNOSIS — C3412 Malignant neoplasm of upper lobe, left bronchus or lung: Secondary | ICD-10-CM | POA: Insufficient documentation

## 2022-03-19 DIAGNOSIS — M47816 Spondylosis without myelopathy or radiculopathy, lumbar region: Secondary | ICD-10-CM | POA: Diagnosis not present

## 2022-03-19 DIAGNOSIS — Z7189 Other specified counseling: Secondary | ICD-10-CM | POA: Diagnosis not present

## 2022-03-19 DIAGNOSIS — Z87891 Personal history of nicotine dependence: Secondary | ICD-10-CM | POA: Diagnosis not present

## 2022-03-19 DIAGNOSIS — N281 Cyst of kidney, acquired: Secondary | ICD-10-CM | POA: Diagnosis not present

## 2022-03-19 DIAGNOSIS — Z801 Family history of malignant neoplasm of trachea, bronchus and lung: Secondary | ICD-10-CM | POA: Insufficient documentation

## 2022-03-19 DIAGNOSIS — Z8052 Family history of malignant neoplasm of bladder: Secondary | ICD-10-CM | POA: Insufficient documentation

## 2022-03-19 DIAGNOSIS — H2512 Age-related nuclear cataract, left eye: Secondary | ICD-10-CM | POA: Insufficient documentation

## 2022-03-19 DIAGNOSIS — J432 Centrilobular emphysema: Secondary | ICD-10-CM | POA: Diagnosis not present

## 2022-03-19 LAB — CBC WITH DIFFERENTIAL/PLATELET
Abs Immature Granulocytes: 0.01 10*3/uL (ref 0.00–0.07)
Basophils Absolute: 0 10*3/uL (ref 0.0–0.1)
Basophils Relative: 1 %
Eosinophils Absolute: 0.3 10*3/uL (ref 0.0–0.5)
Eosinophils Relative: 6 %
HCT: 37.8 % — ABNORMAL LOW (ref 39.0–52.0)
Hemoglobin: 12.4 g/dL — ABNORMAL LOW (ref 13.0–17.0)
Immature Granulocytes: 0 %
Lymphocytes Relative: 14 %
Lymphs Abs: 0.8 10*3/uL (ref 0.7–4.0)
MCH: 30.5 pg (ref 26.0–34.0)
MCHC: 32.8 g/dL (ref 30.0–36.0)
MCV: 92.9 fL (ref 80.0–100.0)
Monocytes Absolute: 0.7 10*3/uL (ref 0.1–1.0)
Monocytes Relative: 13 %
Neutro Abs: 3.5 10*3/uL (ref 1.7–7.7)
Neutrophils Relative %: 66 %
Platelets: 146 10*3/uL — ABNORMAL LOW (ref 150–400)
RBC: 4.07 MIL/uL — ABNORMAL LOW (ref 4.22–5.81)
RDW: 15.7 % — ABNORMAL HIGH (ref 11.5–15.5)
WBC: 5.2 10*3/uL (ref 4.0–10.5)
nRBC: 0 % (ref 0.0–0.2)

## 2022-03-19 LAB — COMPREHENSIVE METABOLIC PANEL
ALT: 15 U/L (ref 0–44)
AST: 19 U/L (ref 15–41)
Albumin: 3.5 g/dL (ref 3.5–5.0)
Alkaline Phosphatase: 50 U/L (ref 38–126)
Anion gap: 5 (ref 5–15)
BUN: 15 mg/dL (ref 8–23)
CO2: 30 mmol/L (ref 22–32)
Calcium: 9 mg/dL (ref 8.9–10.3)
Chloride: 103 mmol/L (ref 98–111)
Creatinine, Ser: 0.78 mg/dL (ref 0.61–1.24)
GFR, Estimated: >60 mL/min (ref >60)
Glucose, Bld: 108 mg/dL — ABNORMAL HIGH (ref 70–99)
Potassium: 4.9 mmol/L (ref 3.5–5.1)
Sodium: 138 mmol/L (ref 135–145)
Total Bilirubin: 0.6 mg/dL (ref 0.3–1.2)
Total Protein: 7.1 g/dL (ref 6.5–8.1)

## 2022-03-19 NOTE — Progress Notes (Unsigned)
Pt states that from his rt hand t his elbow feels discomfort and causes pain at times. Also, studerrig is starting to become more frequent; will repeat the same thing multiple times. Along, with more frequently SOB.

## 2022-03-20 NOTE — Progress Notes (Signed)
Hematology/Oncology Consult note 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  CT chest showed left perihilar upper lobe masslike opacity measuring 6.6 x 5 x 6.1 cm contiguous with the hilum.  Left hilar adenopathy as well as paratracheal adenopathy.  Severe narrowing of the left upper lobe bronchus due to left perihilar mass.  CT abdomen and pelvis with contrast showed large lytic lesion involving the right iliac bone.  MRI brain with and without contrast did not show any evidence of metastatic disease.  Patient underwent bronchoscopy with biopsy of the left hilar mass which was consistent with adenocarcinoma.   NGS testing showed EGFR L858R mutation that could be targetable with Tagrisso.  Tumor mutational burden high.  PD-L1 less than 1%.  No other actionable mutations.    Interval history-patient states that he is doing well overall.  He has gained 4 pounds.  Nausea is much improved.  He is independent of his ADLs.  Reports occasional pain in his right elbow  ECOG PS- 1 Pain scale- 0   Review of systems- Review of Systems  Constitutional:  Positive for malaise/fatigue. Negative for chills, fever and weight loss.  HENT:  Negative for congestion, ear discharge and nosebleeds.   Eyes:  Negative for blurred  vision.  Respiratory:  Negative for cough, hemoptysis, sputum production, shortness of breath and wheezing.   Cardiovascular:  Negative for chest pain, palpitations, orthopnea and claudication.  Gastrointestinal:  Negative for abdominal pain, blood in stool, constipation, diarrhea, heartburn, melena, nausea and vomiting.  Genitourinary:  Negative for dysuria, flank pain, frequency, hematuria and urgency.  Musculoskeletal:  Negative for back pain, joint pain and myalgias.  Skin:  Negative for rash.  Neurological:  Negative for dizziness, tingling, focal weakness, seizures, weakness and headaches.  Endo/Heme/Allergies:  Does not bruise/bleed easily.  Psychiatric/Behavioral:  Negative for depression and suicidal ideas. The patient does not have insomnia.      No Known Allergies   Past Medical History:  Diagnosis Date   Abnormal ECG    a. baseline inferolateral ST elevation dating back to at least 2012.   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   Sleep apnea    Urinary frequency    Weak urinary stream    Weight loss      Past Surgical History:  Procedure Laterality Date   biospy  09/30/2011-   TRUS/Bx Prostate - 5/12/biopsies positive for cancer, glandular size 32.5 cc   CHOLECYSTECTOMY  1992   RADIOACTIVE SEED IMPLANT  02/09/2012   Procedure: RADIOACTIVE SEED IMPLANT;  Surgeon: Franchot Gallo, MD;  Location: [facility];  Service: Urology;  Laterality: N/A;  RAD TECH OK PER ANNE AT MAIN OR  UVULOPALATOPHARYNGOPLASTY     VIDEO BRONCHOSCOPY WITH ENDOBRONCHIAL ULTRASOUND Bilateral 11/12/2021   Procedure: VIDEO BRONCHOSCOPY WITH ENDOBRONCHIAL ULTRASOUND;  Surgeon: Ottie Glazier, MD;  Location: [facility] ORS;  Service: Thoracic;  Laterality: Bilateral;    Social History   Socioeconomic History   Marital status: Widowed    Spouse  name: Not on file   Number of children: 3   Years of education: Not on file   Highest education level: High school graduate  Occupational History   Occupation: retired  Tobacco Use   Smoking status: Former    Packs/day: 2.00    Years: 28.00    Pack years: 56.00    Types: Cigarettes    Quit date: 10/18/1980    Years since quitting: 41.4   Smokeless tobacco: Never  Substance and Sexual Activity   Alcohol use: No   Drug use: No   Sexual activity: Not on file    Comment: Low libido  Other Topics Concern   Not on file  Social History Narrative   Lives locally.  Was caring for his wife until she passed in December.  Sedentary.   Social Determinants of Health   Financial Resource Strain: Not on file  Food Insecurity: Not on file  Transportation Needs: Not on file  Physical Activity: Not on file  Stress: Not on file  Social Connections: Not on file  Intimate Partner Violence: Not on file    Family History  Problem Relation Age of Onset   Heart disease Father    Breast cancer Sister    Cancer Sister        lung   Lung cancer Brother    Cancer Brother        bladder   Lung cancer Sister    Cancer Sister        breast   Kidney disease Brother      Current Outpatient Medications:    TAGRISSO 80 MG tablet, TAKE ONE TABLET BY MOUTH ONCE DAILY AT THE SAME TIME. MAY TAKE WITH OR WITHOUT FOOD., Disp: 15 tablet, Rfl: 1   acetaminophen (TYLENOL) 325 MG tablet, Take 650 mg by mouth every 6 (six) hours as needed. (Patient not taking: Reported on 02/10/2022), Disp: , Rfl:    ondansetron (ZOFRAN) 4 MG tablet, Take 1 tablet (4 mg total) by mouth every 8 (eight) hours as needed for nausea or vomiting. (Patient not taking: Reported on 02/10/2022), Disp: 45 tablet, Rfl: 0   prochlorperazine (COMPAZINE) 10 MG tablet, Take 1 tablet (10 mg total) by mouth every 6 (six) hours as needed for nausea or vomiting. (Patient not taking: Reported on 02/10/2022), Disp: 45 tablet, Rfl: 0  Physical exam:   Vitals:   03/19/22 0907  BP: 103/71  Pulse: 79  Resp: 16  Temp: 97.6 F (36.4 C)  SpO2: 100%  Weight: 150 lb (68 kg)   Physical Exam Constitutional:      General: He is not in acute distress. Cardiovascular:     Rate and Rhythm: Normal rate and regular rhythm.     Heart sounds: Normal heart sounds.  Pulmonary:     Effort: Pulmonary effort is normal.     Breath sounds: Normal breath sounds.  Abdominal:     General: Bowel sounds are normal.     Palpations: Abdomen is soft.  Skin:    General: Skin is warm and dry.  Neurological:     Mental Status: He is alert and oriented to person, place, and time.  Latest Ref Rng & Units 03/19/2022    8:49 AM  CMP  Glucose 70 - 99 mg/dL 108    BUN 8 - 23 mg/dL 15    Creatinine 0.61 - 1.24 mg/dL 0.78    Sodium 135 - 145 mmol/L 138    Potassium 3.5 - 5.1 mmol/L 4.9    Chloride 98 - 111 mmol/L 103    CO2 22 - 32 mmol/L 30    Calcium 8.9 - 10.3 mg/dL 9.0    Total Protein 6.5 - 8.1 g/dL 7.1    Total Bilirubin 0.3 - 1.2 mg/dL 0.6    Alkaline Phos 38 - 126 U/L 50    AST 15 - 41 U/L 19    ALT 0 - 44 U/L 15        Latest Ref Rng & Units 03/19/2022    8:49 AM  CBC  WBC 4.0 - 10.5 K/uL 5.2    Hemoglobin 13.0 - 17.0 g/dL 12.4    Hematocrit 39.0 - 52.0 % 37.8    Platelets 150 - 400 K/uL 146      No images are attached to the encounter.  NM Bone Scan Whole Body  Result Date: 03/13/2022 CLINICAL DATA:  Metastatic lung cancer EXAM: NUCLEAR MEDICINE WHOLE BODY BONE SCAN TECHNIQUE: Whole body anterior and posterior images were obtained approximately 3 hours after intravenous injection of radiopharmaceutical. RADIOPHARMACEUTICALS:  23.44 mCi Technetium-45mMDP IV COMPARISON:  CT chest abdomen pelvis 03/12/2022 FINDINGS: Large area of abnormal increased tracer uptake within RIGHT iliac bone consistent with osseous metastatic disease. This corresponds to a lytic lesion on CT. Mild uptake of tracer at multiple levels in the lumbar spine  corresponding to degenerative changes on CT. Uptake at shoulders and sternoclavicular joints typically degenerative. No additional worrisome sites of tracer accumulation. Expected urinary tract and soft tissue distribution of tracer. IMPRESSION: Large focus of increased tracer uptake at RIGHT iliac bone consistent with osseous metastatic disease and corresponding to lytic lesion on CT. No additional concerning scintigraphic findings. Electronically Signed   By: MLavonia DanaM.D.   On: 03/13/2022 14:45   CT CHEST ABDOMEN PELVIS W CONTRAST  Result Date: 03/15/2022 CLINICAL DATA:  Metastatic lung cancer EXAM: CT CHEST, ABDOMEN, AND PELVIS WITH CONTRAST TECHNIQUE: Multidetector CT imaging of the chest, abdomen and pelvis was performed following the standard protocol during bolus administration of intravenous contrast. RADIATION DOSE REDUCTION: This exam was performed according to the departmental dose-optimization program which includes automated exposure control, adjustment of the mA and/or kV according to patient size and/or use of iterative reconstruction technique. CONTRAST:  1063mOMNIPAQUE IOHEXOL 300 MG/ML  SOLN COMPARISON:  CT chest dated 11/09/2021. CT abdomen/pelvis dated 11/11/2021. FINDINGS: CT CHEST FINDINGS Cardiovascular: Heart is normal in size.  No pericardial effusion. No evidence of thoracic aortic aneurysm. Atherosclerotic calcifications of the aortic arch. Mediastinum/Nodes: Mild thoracic lymphadenopathy, improved, including: --8 mm short axis AP window node (series 2/image 23), previously 9 mm --13 mm short axis AP window node (series 2/image 25), previously 18 mm --9 mm short axis subcarinal node (series 2/image 30), previously 10 mm --14 mm short axis left hilar node (series 2/image 31), previously 24 mm Visualized thyroid is unremarkable. Lungs/Pleura: 4.2 cm spiculated central left upper lobe mass with narrowing of the left upper lobe bronchus (series 6/image 51), corresponding to the  patient's known primary bronchogenic neoplasm with surrounding radiation changes, previously 6.6 cm. Associated 2.7 cm satellite nodule in the anterior left upper lobe (series 6/image 32), previously 11  mm. Biapical pleural-parenchymal scarring. Mild centrilobular emphysematous changes, upper lung predominant. Mild subpleural scarring in the bilateral lower lobes. Calcified pleural plaques bilaterally. No pleural effusion or pneumothorax. Musculoskeletal: Degenerative changes of the thoracic spine. CT ABDOMEN PELVIS FINDINGS Hepatobiliary: Numerous hepatic cysts, right lobe predominant, including a 4.0 cm septated cyst in the right hepatic dome (series 2/image 83), benign. Status post cholecystectomy. No intrahepatic or extrahepatic duct dilatation. Pancreas: Within normal limits. Spleen: Within normal limits. Adrenals/Urinary Tract: Adrenal glands are within normal limits. Subcentimeter bilateral renal cysts. No hydronephrosis. Thick-walled bladder. Stomach/Bowel: Stomach is notable for postsurgical changes the GE junction. No evidence of bowel obstruction. Normal appendix (series 2/image 85). Left colon is decompressed. Vascular/Lymphatic: No evidence of abdominal aortic aneurysm. Atherosclerotic calcifications of the abdominal aorta and branch vessels. No suspicious abdominopelvic lymphadenopathy. Reproductive: Prostate is notable for brachytherapy seeds. Other: No abdominopelvic ascites. Musculoskeletal: Lytic metastasis in the right iliac bone (series 2/image 95), with improving soft tissue component. Mild degenerative changes of the lumbar spine. IMPRESSION: Improving central left upper lobe mass, corresponding to the patient's known primary bronchogenic neoplasm, with surrounding radiation changes. Associated thoracic lymphadenopathy, mildly improved. Interval progression of a 2.7 cm satellite nodule in the anterior left upper lobe. Improving lytic metastasis in the right iliac bone. Additional ancillary  findings as above. Electronically Signed   By: Julian Hy M.D.   On: 03/15/2022 00:48     Assessment and plan- Patient is a 81 y.o. male with EGFR positive metastatic lung adenocarcinoma with bone metastases here to discuss CT scan results and further management  I have reviewedCT chest abdomen pelvis as well as bone scan images independently and discussed findings with the patient.  His dominant left upper lobe lung mass is overall decreasing in size from 6.6 cm to 4.2 cm.  Hilar and mediastinal adenopathy is also reducing.  He also had another satellite nodule in the anterior part of the left upper lobe which was previously 1.1 cm but now is 2.7 cm.  Right iliac lytic metastases is overall stable.  The only area that appears to have progressed is the nondominant left upper lobe lung nodule.  He is otherwise tolerating Tagrisso well without any significant side effects.  I would therefore like to refer him to radiation oncology for consideration of SBRT to the growing left upper lobe lung nodule.  I also discussed the role for bisphosphonates with the patient again given his right iliac bone metastases and patient continues to refuse bisphosphonates.  I will let Dr. Donella Stade decide if he needs radiation to that area as well.  I will see him back in 1 month with labs   Visit Diagnosis 1. Primary malignant neoplasm of right lung metastatic to other site [facility])   2. High risk medication use   3. Goals of care, counseling/discussion      Dr. Randa Evens, MD, MPH [facility] [id number] 03/20/2022 12:34 PM

## 2022-03-22 ENCOUNTER — Other Ambulatory Visit: Payer: Self-pay | Admitting: Oncology

## 2022-03-22 DIAGNOSIS — C3412 Malignant neoplasm of upper lobe, left bronchus or lung: Secondary | ICD-10-CM

## 2022-03-23 NOTE — Telephone Encounter (Signed)
Contains abnormal data CBC with Differential/Platelet Order: [id number] Status: Final result    Visible to patient: Yes (seen)    Next appt: 03/24/2022 at 11:00 AM in Radiation Oncology Noreene Filbert, MD)    Dx: Primary malignant neoplasm of right l...    0 Result Notes           Component Ref Range & Units 4 d ago (03/19/22) 1 mo ago (02/10/22) 2 mo ago (01/13/22) 4 mo ago (11/16/21) 4 mo ago (11/15/21) 4 mo ago (11/14/21) 4 mo ago (11/13/21)  WBC 4.0 - 10.5 K/uL 5.2  6.2  6.2  8.6  9.8  10.7 High   8.0   RBC 4.22 - 5.81 MIL/uL 4.07 Low   4.14 Low   4.32  4.46  4.54  4.46  4.64   Hemoglobin 13.0 - 17.0 g/dL 12.4 Low   12.1 Low   12.5 Low   13.2  13.3  13.0  13.5   HCT 39.0 - 52.0 % 37.8 Low   37.2 Low   38.2 Low   40.2  40.0  39.4  40.4   MCV 80.0 - 100.0 fL 92.9  89.9  88.4  90.1  88.1  88.3  87.1   MCH 26.0 - 34.0 pg 30.5  29.2  28.9  29.6  29.3  29.1  29.1   MCHC 30.0 - 36.0 g/dL 32.8  32.5  32.7  32.8  33.3  33.0  33.4   RDW 11.5 - 15.5 % 15.7 High   16.6 High   15.2  13.3  13.5  13.5  13.3   Platelets 150 - 400 K/uL 146 Low   164  146 Low   231  231  230  228   Comment: SPECIMEN CHECKED FOR CLOTS  nRBC 0.0 - 0.2 % 0.0  0.0  0.0  0.0 CM  0.0 CM  0.0 CM  0.0 CM   Neutrophils Relative % % 66  68  67       Neutro Abs 1.7 - 7.7 K/uL 3.5  4.3  4.1       Lymphocytes Relative % 14  13  14        Lymphs Abs 0.7 - 4.0 K/uL 0.8  0.8  0.9       Monocytes Relative % 13  14  11        Monocytes Absolute 0.1 - 1.0 K/uL 0.7  0.9  0.7       Eosinophils Relative % 6  4  7        Eosinophils Absolute 0.0 - 0.5 K/uL 0.3  0.3  0.5       Basophils Relative % 1  1  1        Basophils Absolute 0.0 - 0.1 K/uL 0.0  0.0  0.1       Immature Granulocytes % 0  0  0       Abs Immature Granulocytes 0.00 - 0.07 K/uL 0.01  0.01 CM  0.01 CM       Comment: Performed at [facility], [address] [place], [place] [address]  Resulting Agency  [facility] CLIN LAB [place] CL[facility] [place] [facility] [place] [facility] [place] [facility] [place] [facility]  LAB [facility] CLIN LAB         Specimen Collected: 03/19/22 08:49 Last Resulted: 03/19/22 09:10      Lab Flowsheet    Order Details    View Encounter    Lab and Collection Details  Routing    Result History    Brewing technologist      CM=Additional comments      Result Care Coordination   Patient Communication   Add Comments   Seen Back to Top       Other Results from 03/19/2022   Contains abnormal data Comprehensive metabolic panel Order: [id number] Status: Final result    Visible to patient: Yes (seen)    Next appt: 03/24/2022 at 11:00 AM in Radiation Oncology Noreene Filbert, MD)    Dx: Primary malignant neoplasm of right l...    0 Result Notes           Component Ref Range & Units 4 d ago (03/19/22) 1 mo ago (02/10/22) 2 mo ago (01/13/22) 4 mo ago (11/16/21) 4 mo ago (11/15/21) 4 mo ago (11/14/21) 4 mo ago (11/13/21)  Sodium 135 - 145 mmol/L 138  132 Low   134 Low   136  137  135  134 Low    Potassium 3.5 - 5.1 mmol/L 4.9  4.1  4.3  4.5  4.3  4.0  4.6   Chloride 98 - 111 mmol/L 103  97 Low   100  99  99  101  101   CO2 22 - 32 mmol/L 30  29  29  30  29  28  27    Glucose, Bld 70 - 99 mg/dL 108 High   121 High  CM  111 High  CM  105 High  CM  111 High  CM  111 High  CM  177 High  CM   Comment: Glucose reference range applies only to samples taken after fasting for at least 8 hours.  BUN 8 - 23 mg/dL 15  15  12  14  22  24  High   21   Creatinine, Ser 0.61 - 1.24 mg/dL 0.78  0.68  0.71  0.87  0.91  0.62  0.75   Calcium 8.9 - 10.3 mg/dL 9.0  8.9  8.9  8.9  9.5  9.1  9.3   Total Protein 6.5 - 8.1 g/dL 7.1  7.7  7.0       Albumin 3.5 - 5.0 g/dL 3.5  3.4 Low   3.1 Low        AST 15 - 41 U/L 19  19  21        ALT 0 - 44 U/L 15  17  20        Alkaline Phosphatase 38 - 126 U/L 50  71  74       Total Bilirubin 0.3 - 1.2 mg/dL 0.6  0.8  0.7       GFR, Estimated >60 mL/min >60  >60 CM  >60 CM  >60 CM  >60 CM  >60 CM  >60 CM   Comment: (NOTE)  Calculated using the CKD-EPI  Creatinine Equation (2021)   Anion gap 5 - 15 5  6  CM  5 CM  7 CM  9 CM  6 CM  6 CM   Comment: Performed at [facility], [address] [place], [place] [address]  Resulting Agency  [facility] CLIN LAB [place] CLIN LAB [place] CLIN LAB [place] CLIN LAB [place] CLIN LAB [place] CL[facility] [place] [facility] LAB         Specimen Collected: 03/19/22 08:49 Last Resulted: 03/19/22 09:15

## 2022-03-24 ENCOUNTER — Encounter: Payer: Self-pay | Admitting: Radiation Oncology

## 2022-03-24 ENCOUNTER — Ambulatory Visit
Admission: RE | Admit: 2022-03-24 | Discharge: 2022-03-24 | Disposition: A | Discharge status: Home / Self Care | Payer: Medicare Other | Source: Ambulatory Visit | Attending: Radiation Oncology | Admitting: Radiation Oncology

## 2022-03-24 VITALS — BP 105/67 | HR 91 | Temp 96.6°F | Resp 16 | Ht 69.5 in | Wt 152.7 lb

## 2022-03-24 DIAGNOSIS — Z923 Personal history of irradiation: Secondary | ICD-10-CM | POA: Insufficient documentation

## 2022-03-24 DIAGNOSIS — Z87891 Personal history of nicotine dependence: Secondary | ICD-10-CM | POA: Diagnosis not present

## 2022-03-24 DIAGNOSIS — C3412 Malignant neoplasm of upper lobe, left bronchus or lung: Secondary | ICD-10-CM | POA: Diagnosis not present

## 2022-03-24 DIAGNOSIS — Z79899 Other long term (current) drug therapy: Secondary | ICD-10-CM | POA: Diagnosis not present

## 2022-03-24 DIAGNOSIS — Z809 Family history of malignant neoplasm, unspecified: Secondary | ICD-10-CM | POA: Diagnosis not present

## 2022-03-24 DIAGNOSIS — Z801 Family history of malignant neoplasm of trachea, bronchus and lung: Secondary | ICD-10-CM | POA: Diagnosis not present

## 2022-03-24 DIAGNOSIS — G4730 Sleep apnea, unspecified: Secondary | ICD-10-CM | POA: Diagnosis not present

## 2022-03-24 DIAGNOSIS — Z803 Family history of malignant neoplasm of breast: Secondary | ICD-10-CM | POA: Insufficient documentation

## 2022-03-24 DIAGNOSIS — C7951 Secondary malignant neoplasm of bone: Secondary | ICD-10-CM | POA: Insufficient documentation

## 2022-03-24 NOTE — Consult Note (Addendum)
NEW PATIENT EVALUATION  Name: Jared Mullen  MRN: [id number]  Date:   03/24/2022     DOB: 10-Jun-1941   This 81 y.o. 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  REFERRING PHYSICIAN: Birdie Sons, MD  CHIEF COMPLAINT:  Chief Complaint  Patient presents with   Lung Cancer    DIAGNOSIS: The encounter diagnosis was Primary cancer of left upper lobe of lung ([place]).   PREVIOUS INVESTIGATIONS:  CT scans bone scan MRI of brain all reviewed Clinical notes reviewed Pathology reports reviewed  HPI: Patient is an 81 year old male initially met admitted to the hospital with weakness cough and weight loss back in January 23.  CT scan of the chest showed a 6.6 cm mass contiguous with the hilum and left hilar adenopathy.  He also had paratracheal adenopathy.  Work-up included a lytic lesion involving the right iliac bone MRI scan of the brain without contrast did not show evidence of metastatic disease.  He underwent Boscia microscopy which was positive of the left hilar mass consistent with adenocarcinoma EGFR positive.  Patient has been on Jessie which she is tolerated well.  He has had a good response with decreased size of 6.6 cm of the left hilar mass to 4.2 cm.  He does have progression of a left upper lobe nodule from 1.1 to 2.7 cm.  I been asked to evaluate him for SBRT to that lesion based on the progressive growth.  He also has a right iliac lytic lesion which is asymptomatic.  Patient also has a history of prostate cancer treated with external beam treatment as well as interstitial implant with no progression of disease.  The lesion to the right iliac wing is approximately 4 cm in greatest dimension.  PLANNED TREATMENT REGIMEN: SBRT to left upper lobe and hypofractionated course of radiation to his right iliac wing  PAST MEDICAL HISTORY:  has a past medical history of Abnormal ECG, Cataract immature  (right eye ), History of radiation therapy (12/21/11 to 01/24/12), Nocturia, Peyronie's disease, Prostate cancer ([place]) (dx 09/30/2011), Sleep apnea, Urinary frequency, Weak urinary stream, and Weight loss.    PAST SURGICAL HISTORY:  Past Surgical History:  Procedure Laterality Date   biospy  09/30/2011-   TRUS/Bx Prostate - 5/12/biopsies positive for cancer, glandular size 32.5 cc   CHOLECYSTECTOMY  1992   RADIOACTIVE SEED IMPLANT  02/09/2012   Procedure: RADIOACTIVE SEED IMPLANT;  Surgeon: Franchot Gallo, MD;  Location: [facility];  Service: Urology;  Laterality: N/A;  RAD TECH OK PER ANNE AT MAIN OR    UVULOPALATOPHARYNGOPLASTY     VIDEO BRONCHOSCOPY WITH ENDOBRONCHIAL ULTRASOUND Bilateral 11/12/2021   Procedure: VIDEO BRONCHOSCOPY WITH ENDOBRONCHIAL ULTRASOUND;  Surgeon: Ottie Glazier, MD;  Location: [facility];  Service: Thoracic;  Laterality: Bilateral;    FAMILY HISTORY: family history includes Breast cancer in his sister; Cancer in his brother, sister, and sister; Heart disease in his father; Kidney disease in his brother; Lung cancer in his brother and sister.  SOCIAL HISTORY:  reports that he quit smoking about 41 years ago. His smoking use included cigarettes. He has a 56.00 pack-year smoking history. He has never used smokeless tobacco. He reports that he does not drink alcohol and does not use drugs.  ALLERGIES: Patient has no known allergies.  MEDICATIONS:  Current Outpatient Medications  Medication Sig Dispense Refill   TAGRISSO 80 MG tablet TAKE ONE TABLET BY MOUTH  ONCE DAILY AT THE SAME TIME. MAY TAKE WITH OR WITHOUT FOOD. 15 tablet 1   acetaminophen (TYLENOL) 325 MG tablet Take 650 mg by mouth every 6 (six) hours as needed. (Patient not taking: Reported on 02/10/2022)     ondansetron (ZOFRAN) 4 MG tablet Take 1 tablet (4 mg total) by mouth every 8 (eight) hours as needed for nausea or vomiting. (Patient not taking: Reported on 02/10/2022) 45 tablet 0    prochlorperazine (COMPAZINE) 10 MG tablet Take 1 tablet (10 mg total) by mouth every 6 (six) hours as needed for nausea or vomiting. (Patient not taking: Reported on 02/10/2022) 45 tablet 0   No current facility-administered medications for this encounter.    ECOG PERFORMANCE STATUS:  0 - Asymptomatic  REVIEW OF SYSTEMS: Patient denies any weight loss, fatigue, weakness, fever, chills or night sweats. Patient denies any loss of vision, blurred vision. Patient denies any ringing  of the ears or hearing loss. No irregular heartbeat. Patient denies heart murmur or history of fainting. Patient denies any chest pain or pain radiating to her upper extremities. Patient denies any shortness of breath, difficulty breathing at night, cough or hemoptysis. Patient denies any swelling in the lower legs. Patient denies any nausea vomiting, vomiting of blood, or coffee ground material in the vomitus. Patient denies any stomach pain. Patient states has had normal bowel movements no significant constipation or diarrhea. Patient denies any dysuria, hematuria or significant nocturia. Patient denies any problems walking, swelling in the joints or loss of balance. Patient denies any skin changes, loss of hair or loss of weight. Patient denies any excessive worrying or anxiety or significant depression. Patient denies any problems with insomnia. Patient denies excessive thirst, polyuria, polydipsia. Patient denies any swollen glands, patient denies easy bruising or easy bleeding. Patient denies any recent infections, allergies or URI. Patient "s visual fields have not changed significantly in recent time.   PHYSICAL EXAM: BP 105/67 (BP Location: Left Arm, Patient Position: Sitting, Cuff Size: Normal)   Pulse 91   Temp (!) 96.6 F (35.9 C) (Tympanic)   Resp 16   Ht 5' 9.5" (1.765 m)   Wt 152 lb 11.2 oz (69.3 kg)   BMI 22.23 kg/m  Well-developed well-nourished patient in NAD. HEENT reveals PERLA, EOMI, discs not  visualized.  Oral cavity is clear. No oral mucosal lesions are identified. Neck is clear without evidence of cervical or supraclavicular adenopathy. Lungs are clear to A&P. Cardiac examination is essentially unremarkable with regular rate and rhythm without murmur rub or thrill. Abdomen is benign with no organomegaly or masses noted. Motor sensory and DTR levels are equal and symmetric in the upper and lower extremities. Cranial nerves II through XII are grossly intact. Proprioception is intact. No peripheral adenopathy or edema is identified. No motor or sensory levels are noted. Crude visual fields are within normal range.  LABORATORY DATA: Pathology reports reviewed    RADIOLOGY RESULTS: CT scans MRI scans bone scan all reviewed compatible with above-stated findings   IMPRESSION: Stage IV adenocarcinoma of the left lung with progression of the left upper lobe nodule excellent response to Tagrisso to the main lung dominant lesion.  Also metastatic disease of the right iliac crest in 81 year old male  PLAN: This time of offered SBRT to the left upper lobe lesion.  Would plan on delivering 60 Gray in 5 fractions.  Low side effect profile was discussed with the patient and his daughter.  They both seem to comprehend the recommendations well.  Would use motion restriction as well as for dimensional treatment planning.  I have set him up for simulation next week.  Also would offer 20 Gray in 5 fractions to the right iliac wing metastatic lesion.  This eventually will fracture to prevent further pain and discomfort would like to treated at this point rather than wait for it becomes symptomatic.  Patient comprehends my recommendations well.  I would like to take this opportunity to thank you for allowing me to participate in the care of your patient.Noreene Filbert, MD

## 2022-04-01 ENCOUNTER — Ambulatory Visit: Admission: RE | Admit: 2022-04-01 | Payer: Medicare Other | Source: Ambulatory Visit

## 2022-04-01 DIAGNOSIS — Z87891 Personal history of nicotine dependence: Secondary | ICD-10-CM | POA: Diagnosis not present

## 2022-04-01 DIAGNOSIS — Z510 Encounter for antineoplastic radiation therapy: Secondary | ICD-10-CM | POA: Diagnosis not present

## 2022-04-01 DIAGNOSIS — C7951 Secondary malignant neoplasm of bone: Secondary | ICD-10-CM | POA: Diagnosis present

## 2022-04-01 DIAGNOSIS — C3412 Malignant neoplasm of upper lobe, left bronchus or lung: Secondary | ICD-10-CM | POA: Diagnosis present

## 2022-04-02 ENCOUNTER — Inpatient Hospital Stay (HOSPITAL_BASED_OUTPATIENT_CLINIC_OR_DEPARTMENT_OTHER): Payer: Medicare Other | Admitting: Hospice and Palliative Medicine

## 2022-04-02 DIAGNOSIS — C3491 Malignant neoplasm of unspecified part of right bronchus or lung: Secondary | ICD-10-CM

## 2022-04-02 NOTE — Progress Notes (Signed)
I called and spoke with patient's daughter.  Patient was feeling tired and was laying down.  No significant changes or concerns reported.  Reviewed upcoming appointments.

## 2022-04-05 ENCOUNTER — Telehealth: Payer: Medicare Other | Admitting: Hospice and Palliative Medicine

## 2022-04-09 DIAGNOSIS — Z510 Encounter for antineoplastic radiation therapy: Secondary | ICD-10-CM | POA: Diagnosis not present

## 2022-04-13 ENCOUNTER — Ambulatory Visit
Admission: RE | Admit: 2022-04-13 | Discharge: 2022-04-13 | Disposition: A | Discharge status: Home / Self Care | Payer: Medicare Other | Source: Ambulatory Visit | Attending: Radiation Oncology | Admitting: Radiation Oncology

## 2022-04-13 ENCOUNTER — Other Ambulatory Visit: Payer: Self-pay

## 2022-04-13 DIAGNOSIS — Z510 Encounter for antineoplastic radiation therapy: Secondary | ICD-10-CM | POA: Diagnosis not present

## 2022-04-13 LAB — RAD ONC ARIA SESSION SUMMARY
Course Elapsed Days: 0
Plan Fractions Treated to Date: 1
Plan Prescribed Dose Per Fraction: 12 Gy
Plan Total Fractions Prescribed: 5
Plan Total Prescribed Dose: 60 Gy
Reference Point Dosage Given to Date: 12 Gy
Reference Point Session Dosage Given: 12 Gy
Session Number: 1

## 2022-04-15 ENCOUNTER — Other Ambulatory Visit: Payer: Self-pay

## 2022-04-15 ENCOUNTER — Ambulatory Visit
Admission: RE | Admit: 2022-04-15 | Discharge: 2022-04-15 | Disposition: A | Discharge status: Home / Self Care | Payer: Medicare Other | Source: Ambulatory Visit | Attending: Radiation Oncology | Admitting: Radiation Oncology

## 2022-04-15 DIAGNOSIS — Z510 Encounter for antineoplastic radiation therapy: Secondary | ICD-10-CM | POA: Diagnosis not present

## 2022-04-15 LAB — RAD ONC ARIA SESSION SUMMARY
Course Elapsed Days: 2
Plan Fractions Treated to Date: 2
Plan Prescribed Dose Per Fraction: 12 Gy
Plan Total Fractions Prescribed: 5
Plan Total Prescribed Dose: 60 Gy
Reference Point Dosage Given to Date: 24 Gy
Reference Point Session Dosage Given: 12 Gy
Session Number: 2

## 2022-04-19 ENCOUNTER — Encounter: Payer: Self-pay | Admitting: Oncology

## 2022-04-19 ENCOUNTER — Inpatient Hospital Stay: Payer: Medicare Other | Attending: Hospice and Palliative Medicine

## 2022-04-19 ENCOUNTER — Inpatient Hospital Stay (HOSPITAL_BASED_OUTPATIENT_CLINIC_OR_DEPARTMENT_OTHER): Payer: Medicare Other | Admitting: Oncology

## 2022-04-19 VITALS — BP 108/66 | HR 63 | Temp 97.8°F | Resp 16 | Ht 69.5 in | Wt 154.0 lb

## 2022-04-19 DIAGNOSIS — Z803 Family history of malignant neoplasm of breast: Secondary | ICD-10-CM | POA: Diagnosis not present

## 2022-04-19 DIAGNOSIS — Z801 Family history of malignant neoplasm of trachea, bronchus and lung: Secondary | ICD-10-CM | POA: Diagnosis not present

## 2022-04-19 DIAGNOSIS — R590 Localized enlarged lymph nodes: Secondary | ICD-10-CM | POA: Diagnosis not present

## 2022-04-19 DIAGNOSIS — Z79899 Other long term (current) drug therapy: Secondary | ICD-10-CM | POA: Diagnosis not present

## 2022-04-19 DIAGNOSIS — Z8546 Personal history of malignant neoplasm of prostate: Secondary | ICD-10-CM | POA: Diagnosis not present

## 2022-04-19 DIAGNOSIS — C7951 Secondary malignant neoplasm of bone: Secondary | ICD-10-CM | POA: Diagnosis not present

## 2022-04-19 DIAGNOSIS — Z923 Personal history of irradiation: Secondary | ICD-10-CM | POA: Diagnosis not present

## 2022-04-19 DIAGNOSIS — C3412 Malignant neoplasm of upper lobe, left bronchus or lung: Secondary | ICD-10-CM | POA: Diagnosis present

## 2022-04-19 DIAGNOSIS — G4730 Sleep apnea, unspecified: Secondary | ICD-10-CM | POA: Insufficient documentation

## 2022-04-19 DIAGNOSIS — C3491 Malignant neoplasm of unspecified part of right bronchus or lung: Secondary | ICD-10-CM

## 2022-04-19 LAB — CBC WITH DIFFERENTIAL/PLATELET
Abs Immature Granulocytes: 0.03 10*3/uL (ref 0.00–0.07)
Basophils Absolute: 0 10*3/uL (ref 0.0–0.1)
Basophils Relative: 1 %
Eosinophils Absolute: 0.3 10*3/uL (ref 0.0–0.5)
Eosinophils Relative: 5 %
HCT: 39.8 % (ref 39.0–52.0)
Hemoglobin: 13 g/dL (ref 13.0–17.0)
Immature Granulocytes: 1 %
Lymphocytes Relative: 13 %
Lymphs Abs: 0.7 10*3/uL (ref 0.7–4.0)
MCH: 30.4 pg (ref 26.0–34.0)
MCHC: 32.7 g/dL (ref 30.0–36.0)
MCV: 93 fL (ref 80.0–100.0)
Monocytes Absolute: 0.9 10*3/uL (ref 0.1–1.0)
Monocytes Relative: 16 %
Neutro Abs: 3.4 10*3/uL (ref 1.7–7.7)
Neutrophils Relative %: 64 %
Platelets: 123 10*3/uL — ABNORMAL LOW (ref 150–400)
RBC: 4.28 MIL/uL (ref 4.22–5.81)
RDW: 14.7 % (ref 11.5–15.5)
WBC: 5.3 10*3/uL (ref 4.0–10.5)
nRBC: 0 % (ref 0.0–0.2)

## 2022-04-19 LAB — COMPREHENSIVE METABOLIC PANEL
ALT: 15 U/L (ref 0–44)
AST: 20 U/L (ref 15–41)
Albumin: 3.6 g/dL (ref 3.5–5.0)
Alkaline Phosphatase: 51 U/L (ref 38–126)
Anion gap: 6 (ref 5–15)
BUN: 16 mg/dL (ref 8–23)
CO2: 30 mmol/L (ref 22–32)
Calcium: 9 mg/dL (ref 8.9–10.3)
Chloride: 102 mmol/L (ref 98–111)
Creatinine, Ser: 0.78 mg/dL (ref 0.61–1.24)
GFR, Estimated: >60 mL/min (ref >60)
Glucose, Bld: 105 mg/dL — ABNORMAL HIGH (ref 70–99)
Potassium: 4.7 mmol/L (ref 3.5–5.1)
Sodium: 138 mmol/L (ref 135–145)
Total Bilirubin: 1.2 mg/dL (ref 0.3–1.2)
Total Protein: 6.8 g/dL (ref 6.5–8.1)

## 2022-04-19 NOTE — Progress Notes (Signed)
Hematology/Oncology Consult note [facility]  Telephone:([phone] [phone]  Patient Care Team: Birdie Sons, MD as PCP - General (Family Medicine) Rockey Situ Kathlene November, MD as PCP - Cardiology (Cardiology) Franchot Gallo, MD as Consulting Physician (Urology) Telford Nab, RN as Oncology Nurse Navigator   Name of the patient: Jared Mullen  [id number]  06/29/1941   Date of visit: 04/19/22  Diagnosis- metastatic EGFR positive adenocarcinoma of the lung with bone metastases    Chief complaint/ Reason for visit-routine follow-up of lung cancer on Tagrisso  Heme/Onc history: Patient is a 81 year old male who was admitted to the hospital in January 2023 with symptoms of generalized weakness cough and ongoing weight loss.  CT chest showed left perihilar upper lobe masslike opacity measuring 6.6 x 5 x 6.1 cm contiguous with the hilum.  Left hilar adenopathy as well as paratracheal adenopathy.  Severe narrowing of the left upper lobe bronchus due to left perihilar mass.  CT abdomen and pelvis with contrast showed large lytic lesion involving the right iliac bone.  MRI brain with and without contrast did not show any evidence of metastatic disease.  Patient underwent bronchoscopy with biopsy of the left hilar mass which was consistent with adenocarcinoma.   NGS testing showed EGFR L858R mutation that could be targetable with Tagrisso.  Tumor mutational burden high.  PD-L1 less than 1%.  No other actionable mutations.    Interval history-patient is tolerating Tagrisso well without any significant side effects.  He is presently going through radiation treatment for his left upper lobe lesion.  After that he will be going for radiation for the right iliac bone as well.  Patient's daughter reports that he is having some word finding difficulty of late.  Otherwise doing well  ECOG PS- 1 Pain scale- 0 Opioid associated constipation- no  Review of systems-  Review of Systems  Constitutional:  Positive for malaise/fatigue. Negative for chills, fever and weight loss.  HENT:  Negative for congestion, ear discharge and nosebleeds.   Eyes:  Negative for blurred vision.  Respiratory:  Negative for cough, hemoptysis, sputum production, shortness of breath and wheezing.   Cardiovascular:  Negative for chest pain, palpitations, orthopnea and claudication.  Gastrointestinal:  Negative for abdominal pain, blood in stool, constipation, diarrhea, heartburn, melena, nausea and vomiting.  Genitourinary:  Negative for dysuria, flank pain, frequency, hematuria and urgency.  Musculoskeletal:  Negative for back pain, joint pain and myalgias.  Skin:  Negative for rash.  Neurological:  Negative for dizziness, tingling, focal weakness, seizures, weakness and headaches.  Endo/Heme/Allergies:  Does not bruise/bleed easily.  Psychiatric/Behavioral:  Negative for depression and suicidal ideas. The patient does not have insomnia.       No Known Allergies   Past Medical History:  Diagnosis Date   Abnormal ECG    a. baseline inferolateral ST elevation dating back to at least 2012.   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   Sleep apnea    Urinary frequency    Weak urinary stream    Weight loss      Past Surgical History:  Procedure Laterality Date   biospy  09/30/2011-   TRUS/Bx Prostate - 5/12/biopsies positive for cancer, glandular size 32.5 cc   CHOLECYSTECTOMY  1992   RADIOACTIVE SEED IMPLANT  02/09/2012  Procedure: RADIOACTIVE SEED IMPLANT;  Surgeon: Franchot Gallo, MD;  Location: [facility];  Service: Urology;  Laterality: N/A;  RAD TECH OK PER ANNE AT MAIN OR    UVULOPALATOPHARYNGOPLASTY     VIDEO BRONCHOSCOPY WITH ENDOBRONCHIAL ULTRASOUND Bilateral 11/12/2021   Procedure: VIDEO BRONCHOSCOPY WITH  ENDOBRONCHIAL ULTRASOUND;  Surgeon: Ottie Glazier, MD;  Location: [facility];  Service: Thoracic;  Laterality: Bilateral;    Social History   Socioeconomic History   Marital status: Widowed    Spouse name: Not on file   Number of children: 3   Years of education: Not on file   Highest education level: High school graduate  Occupational History   Occupation: retired  Tobacco Use   Smoking status: Former    Packs/day: 2.00    Years: 28.00    Total pack years: 56.00    Types: Cigarettes    Quit date: 10/18/1980    Years since quitting: 41.5   Smokeless tobacco: Never  Substance and Sexual Activity   Alcohol use: No   Drug use: No   Sexual activity: Not on file    Comment: Low libido  Other Topics Concern   Not on file  Social History Narrative   Lives locally.  Was caring for his wife until she passed in December.  Sedentary.   Social Determinants of Health   Financial Resource Strain: Low Risk  (05/14/2019)   Overall Financial Resource Strain (CARDIA)    Difficulty of Paying Living Expenses: Not hard at all  Food Insecurity: No Food Insecurity (05/14/2019)   Hunger Vital Sign    Worried About Running Out of Food in the Last Year: Never true    Ran Out of Food in the Last Year: Never true  Transportation Needs: No Transportation Needs (05/14/2019)   PRAPARE - Hydrologist (Medical): No    Lack of Transportation (Non-Medical): No  Physical Activity: Inactive (05/14/2019)   Exercise Vital Sign    Days of Exercise per Week: 0 days    Minutes of Exercise per Session: 0 min  Stress: No Stress Concern Present (05/14/2019)   [place]    Feeling of Stress : Not at all  Social Connections: Unknown (05/14/2019)   Social Connection and Isolation Panel [NHANES]    Frequency of Communication with Friends and Family: Patient refused    Frequency of Social Gatherings with Friends and Family:  Patient refused    Attends Religious Services: Patient refused    Active Member of Clubs or Organizations: Patient refused    Attends Archivist Meetings: Patient refused    Marital Status: Patient refused  Intimate Partner Violence: Unknown (05/14/2019)   Humiliation, Afraid, Rape, and Kick questionnaire    Fear of Current or Ex-Partner: Patient refused    Emotionally Abused: Patient refused    Physically Abused: Patient refused    Sexually Abused: Patient refused    Family History  Problem Relation Age of Onset   Heart disease Father    Breast cancer Sister    Cancer Sister        lung   Lung cancer Brother    Cancer Brother        bladder   Lung cancer Sister    Cancer Sister        breast   Kidney disease Brother      Current Outpatient Medications:    TAGRISSO 80 MG tablet, TAKE ONE TABLET  BY MOUTH ONCE DAILY AT THE SAME TIME. MAY TAKE WITH OR WITHOUT FOOD., Disp: 15 tablet, Rfl: 1   acetaminophen (TYLENOL) 325 MG tablet, Take 650 mg by mouth every 6 (six) hours as needed. (Patient not taking: Reported on 02/10/2022), Disp: , Rfl:    ondansetron (ZOFRAN) 4 MG tablet, Take 1 tablet (4 mg total) by mouth every 8 (eight) hours as needed for nausea or vomiting. (Patient not taking: Reported on 02/10/2022), Disp: 45 tablet, Rfl: 0   prochlorperazine (COMPAZINE) 10 MG tablet, Take 1 tablet (10 mg total) by mouth every 6 (six) hours as needed for nausea or vomiting. (Patient not taking: Reported on 02/10/2022), Disp: 45 tablet, Rfl: 0  Physical exam:  Vitals:   04/19/22 1037  BP: 108/66  Pulse: 63  Resp: 16  Temp: 97.8 F (36.6 C)  TempSrc: Tympanic  SpO2: 100%  Weight: 154 lb (69.9 kg)  Height: 5' 9.5" (1.765 m)   Physical Exam Constitutional:      General: He is not in acute distress. Cardiovascular:     Rate and Rhythm: Normal rate and regular rhythm.     Heart sounds: Normal heart sounds.  Pulmonary:     Effort: Pulmonary effort is normal.     Breath  sounds: Normal breath sounds.  Abdominal:     General: Bowel sounds are normal.     Palpations: Abdomen is soft.  Skin:    General: Skin is warm and dry.  Neurological:     Mental Status: He is alert and oriented to person, place, and time.         Latest Ref Rng & Units 04/19/2022   10:00 AM  CMP  Glucose 70 - 99 mg/dL 105   BUN 8 - 23 mg/dL 16   Creatinine 0.61 - 1.24 mg/dL 0.78   Sodium 135 - 145 mmol/L 138   Potassium 3.5 - 5.1 mmol/L 4.7   Chloride 98 - 111 mmol/L 102   CO2 22 - 32 mmol/L 30   Calcium 8.9 - 10.3 mg/dL 9.0   Total Protein 6.5 - 8.1 g/dL 6.8   Total Bilirubin 0.3 - 1.2 mg/dL 1.2   Alkaline Phos 38 - 126 U/L 51   AST 15 - 41 U/L 20   ALT 0 - 44 U/L 15       Latest Ref Rng & Units 04/19/2022   10:00 AM  CBC  WBC 4.0 - 10.5 K/uL 5.3   Hemoglobin 13.0 - 17.0 g/dL 13.0   Hematocrit 39.0 - 52.0 % 39.8   Platelets 150 - 400 K/uL 123      Assessment and plan- Patient is a 82 y.o. male with EGFR positive metastatic lung adenocarcinoma with bone metastases.  He is here for routine follow-up of lung cancer on Tagrisso  Patient is tolerating Tagrisso well without any significant side effects.  He is currently undergoing palliative radiation to his left upper lobe followed by right iliac bone lesion.  Newman Nip will continue during this time.  I will see him back after he gets a repeat scan end of August and I will plan to get a CT chest abdomen and pelvis with contrast as well as a bone scan.  Word finding difficulty: Baseline MRI brain did not show any evidence of brain mets.  I will repeat an MRI brain at this time  Mild thrombocytopenia possibly secondary to [place] continue to monitor   Visit Diagnosis 1. Primary malignant neoplasm of right lung metastatic to other site (  [place])   2. High risk medication use      Dr. Randa Evens, MD, MPH [facility] at [facility] [phone] 04/19/2022 12:56 PM

## 2022-04-21 ENCOUNTER — Ambulatory Visit
Admission: RE | Admit: 2022-04-21 | Discharge: 2022-04-21 | Disposition: A | Discharge status: Home / Self Care | Payer: Medicare Other | Source: Ambulatory Visit | Attending: Radiation Oncology | Admitting: Radiation Oncology

## 2022-04-21 ENCOUNTER — Other Ambulatory Visit: Payer: Self-pay

## 2022-04-21 DIAGNOSIS — C7951 Secondary malignant neoplasm of bone: Secondary | ICD-10-CM | POA: Insufficient documentation

## 2022-04-21 DIAGNOSIS — Z87891 Personal history of nicotine dependence: Secondary | ICD-10-CM | POA: Diagnosis not present

## 2022-04-21 DIAGNOSIS — Z510 Encounter for antineoplastic radiation therapy: Secondary | ICD-10-CM | POA: Insufficient documentation

## 2022-04-21 DIAGNOSIS — C3412 Malignant neoplasm of upper lobe, left bronchus or lung: Secondary | ICD-10-CM | POA: Insufficient documentation

## 2022-04-21 LAB — RAD ONC ARIA SESSION SUMMARY
Course Elapsed Days: 8
Plan Fractions Treated to Date: 3
Plan Prescribed Dose Per Fraction: 12 Gy
Plan Total Fractions Prescribed: 5
Plan Total Prescribed Dose: 60 Gy
Reference Point Dosage Given to Date: 36 Gy
Reference Point Session Dosage Given: 12 Gy
Session Number: 3

## 2022-04-23 ENCOUNTER — Ambulatory Visit
Admission: RE | Admit: 2022-04-23 | Discharge: 2022-04-23 | Disposition: A | Discharge status: Home / Self Care | Payer: Medicare Other | Source: Ambulatory Visit | Attending: Radiation Oncology | Admitting: Radiation Oncology

## 2022-04-23 ENCOUNTER — Other Ambulatory Visit: Payer: Self-pay

## 2022-04-23 DIAGNOSIS — C7951 Secondary malignant neoplasm of bone: Secondary | ICD-10-CM | POA: Diagnosis not present

## 2022-04-23 LAB — RAD ONC ARIA SESSION SUMMARY
Course Elapsed Days: 10
Plan Fractions Treated to Date: 4
Plan Prescribed Dose Per Fraction: 12 Gy
Plan Total Fractions Prescribed: 5
Plan Total Prescribed Dose: 60 Gy
Reference Point Dosage Given to Date: 48 Gy
Reference Point Session Dosage Given: 12 Gy
Session Number: 4

## 2022-04-26 ENCOUNTER — Other Ambulatory Visit: Payer: Self-pay

## 2022-04-26 ENCOUNTER — Ambulatory Visit
Admission: RE | Admit: 2022-04-26 | Discharge: 2022-04-26 | Disposition: A | Discharge status: Home / Self Care | Payer: Medicare Other | Source: Ambulatory Visit | Attending: Radiation Oncology | Admitting: Radiation Oncology

## 2022-04-26 DIAGNOSIS — C7951 Secondary malignant neoplasm of bone: Secondary | ICD-10-CM | POA: Diagnosis not present

## 2022-04-26 LAB — RAD ONC ARIA SESSION SUMMARY
Course Elapsed Days: 13
Plan Fractions Treated to Date: 5
Plan Prescribed Dose Per Fraction: 12 Gy
Plan Total Fractions Prescribed: 5
Plan Total Prescribed Dose: 60 Gy
Reference Point Dosage Given to Date: 60 Gy
Reference Point Session Dosage Given: 12 Gy
Session Number: 5

## 2022-04-28 ENCOUNTER — Ambulatory Visit: Payer: Medicare Other

## 2022-04-29 NOTE — Addendum Note (Signed)
Encounter addended by: Noreene Filbert, MD on: 04/29/2022 3:43 PM  Actions taken: Clinical Note Signed

## 2022-04-30 ENCOUNTER — Ambulatory Visit
Admission: RE | Admit: 2022-04-30 | Discharge: 2022-04-30 | Disposition: A | Discharge status: Home / Self Care | Payer: Medicare Other | Source: Ambulatory Visit | Attending: Radiation Oncology | Admitting: Radiation Oncology

## 2022-04-30 ENCOUNTER — Other Ambulatory Visit: Payer: Self-pay | Admitting: Oncology

## 2022-04-30 DIAGNOSIS — C7951 Secondary malignant neoplasm of bone: Secondary | ICD-10-CM | POA: Diagnosis not present

## 2022-04-30 DIAGNOSIS — C3412 Malignant neoplasm of upper lobe, left bronchus or lung: Secondary | ICD-10-CM

## 2022-04-30 NOTE — Telephone Encounter (Signed)
CBC with Differential/Platelet Order: [id number] Status: Final result    Visible to patient: Yes (seen)    Next appt: 05/11/2022 at 11:30 AM in Radiation Oncology [facility])    Dx: Primary malignant neoplasm of right l...    0 Result Notes           Component Ref Range & Units 11 d ago (04/19/22) 1 mo ago (03/19/22) 2 mo ago (02/10/22) 3 mo ago (01/13/22) 5 mo ago (11/16/21) 5 mo ago (11/15/21) 5 mo ago (11/14/21)  WBC 4.0 - 10.5 K/uL 5.3  5.2  6.2  6.2  8.6  9.8  10.7 High    RBC 4.22 - 5.81 MIL/uL 4.28  4.07 Low   4.14 Low   4.32  4.46  4.54  4.46   Hemoglobin 13.0 - 17.0 g/dL 13.0  12.4 Low   12.1 Low   12.5 Low   13.2  13.3  13.0   HCT 39.0 - 52.0 % 39.8  37.8 Low   37.2 Low   38.2 Low   40.2  40.0  39.4   MCV 80.0 - 100.0 fL 93.0  92.9  89.9  88.4  90.1  88.1  88.3   MCH 26.0 - 34.0 pg 30.4  30.5  29.2  28.9  29.6  29.3  29.1   MCHC 30.0 - 36.0 g/dL 32.7  32.8  32.5  32.7  32.8  33.3  33.0   RDW 11.5 - 15.5 % 14.7  15.7 High   16.6 High   15.2  13.3  13.5  13.5   Platelets 150 - 400 K/uL 123 Low   146 Low  CM  164  146 Low   231  231  230   Comment: SPECIMEN CHECKED FOR CLOTS  REPEATED TO VERIFY   nRBC 0.0 - 0.2 % 0.0  0.0  0.0  0.0  0.0 CM  0.0 CM  0.0 CM   Neutrophils Relative % % 64  66  68  67      Neutro Abs 1.7 - 7.7 K/uL 3.4  3.5  4.3  4.1      Lymphocytes Relative % 13  14  13  14       Lymphs Abs 0.7 - 4.0 K/uL 0.7  0.8  0.8  0.9      Monocytes Relative % 16  13  14  11       Monocytes Absolute 0.1 - 1.0 K/uL 0.9  0.7  0.9  0.7      Eosinophils Relative % 5  6  4  7       Eosinophils Absolute 0.0 - 0.5 K/uL 0.3  0.3  0.3  0.5      Basophils Relative % 1  1  1  1       Basophils Absolute 0.0 - 0.1 K/uL 0.0  0.0  0.0  0.1      Immature Granulocytes % 1  0  0  0      Abs Immature Granulocytes 0.00 - 0.07 K/uL 0.03  0.01 CM  0.01 CM  0.01 CM      Comment: Performed at [facility] [address] [place], [place] [address]  Resulting Agency  [facility] CLIN LAB [place] CLIN LAB [place]  CLIN LAB [place] CLIN LAB [place] CLIN LAB [place] CLIN LAB [place] [facility] LAB         Specimen Collected: 04/19/22 10:00 Last Resulted: 04/19/22 10:18      Lab Flowsheet  Order Details    View Encounter    Lab and Collection Details    Routing    Result History    View All Conversations on this Encounter      CM=Additional comments      Result Care Coordination   Patient Communication   Add Comments   Seen Back to Top       Other Results from 04/19/2022   Contains abnormal data Comprehensive metabolic panel Order: [id number] Status: Final result    Visible to patient: Yes (seen)    Next appt: 05/11/2022 at 11:30 AM in Radiation Oncology ([place])    Dx: Primary malignant neoplasm of right l...    0 Result Notes           Component Ref Range & Units 11 d ago (04/19/22) 1 mo ago (03/19/22) 2 mo ago (02/10/22) 3 mo ago (01/13/22) 5 mo ago (11/16/21) 5 mo ago (11/15/21) 5 mo ago (11/14/21)  Sodium 135 - 145 mmol/L 138  138  132 Low   134 Low   136  137  135   Potassium 3.5 - 5.1 mmol/L 4.7  4.9  4.1  4.3  4.5  4.3  4.0   Chloride 98 - 111 mmol/L 102  103  97 Low   100  99  99  101   CO2 22 - 32 mmol/L 30  30  29  29  30  29  28    Glucose, Bld 70 - 99 mg/dL 105 High   108 High  CM  121 High  CM  111 High  CM  105 High  CM  111 High  CM  111 High  CM   Comment: Glucose reference range applies only to samples taken after fasting for at least 8 hours.  BUN 8 - 23 mg/dL 16  15  15  12  14  22  24  High    Creatinine, Ser 0.61 - 1.24 mg/dL 0.78  0.78  0.68  0.71  0.87  0.91  0.62   Calcium 8.9 - 10.3 mg/dL 9.0  9.0  8.9  8.9  8.9  9.5  9.1   Total Protein 6.5 - 8.1 g/dL 6.8  7.1  7.7  7.0      Albumin 3.5 - 5.0 g/dL 3.6  3.5  3.4 Low   3.1 Low       AST 15 - 41 U/L 20  19  19  21       ALT 0 - 44 U/L 15  15  17  20       Alkaline Phosphatase 38 - 126 U/L 51  50  71  74      Total Bilirubin 0.3 - 1.2 mg/dL 1.2  0.6  0.8  0.7      GFR, Estimated >60 mL/min >60  >60 CM  >60 CM  >60 CM  >60 CM  >60  CM  >60 CM   Comment: (NOTE)  Calculated using the CKD-EPI Creatinine Equation (2021)   Anion gap 5 - 15 6  5  CM  6 CM  5 CM  7 CM  9 CM  6 CM   Comment: Performed at [facility], [address] [place], [place] [address]  Resulting Agency  [facility] CLIN LAB [place] CLIN LAB [place] CLIN LAB [place] CLIN LAB [place] CLIN LAB [place] CLIN LAB [place] CL[facility] LAB         Specimen Collected: 04/19/22 10:00 Last Resulted: 04/19/22 10:27

## 2022-05-03 DIAGNOSIS — C7951 Secondary malignant neoplasm of bone: Secondary | ICD-10-CM | POA: Diagnosis not present

## 2022-05-06 ENCOUNTER — Ambulatory Visit: Payer: Medicare Other

## 2022-05-07 ENCOUNTER — Ambulatory Visit: Payer: Medicare Other

## 2022-05-10 ENCOUNTER — Encounter: Payer: Self-pay | Admitting: Oncology

## 2022-05-10 ENCOUNTER — Telehealth: Payer: Self-pay

## 2022-05-10 NOTE — Telephone Encounter (Signed)
1104 am.  Follow up call made to patient to complete a check-in and offer a home visit with Charlann Boxer, NP.  No answer.  Message left requesting a call back.

## 2022-05-11 ENCOUNTER — Ambulatory Visit
Admission: RE | Admit: 2022-05-11 | Discharge: 2022-05-11 | Disposition: A | Discharge status: Home / Self Care | Payer: Medicare Other | Source: Ambulatory Visit | Attending: Radiation Oncology | Admitting: Radiation Oncology

## 2022-05-11 ENCOUNTER — Other Ambulatory Visit: Payer: Self-pay

## 2022-05-11 DIAGNOSIS — C7951 Secondary malignant neoplasm of bone: Secondary | ICD-10-CM | POA: Diagnosis not present

## 2022-05-11 LAB — RAD ONC ARIA SESSION SUMMARY
Course Elapsed Days: 0
Plan Fractions Treated to Date: 1
Plan Prescribed Dose Per Fraction: 4 Gy
Plan Total Fractions Prescribed: 5
Plan Total Prescribed Dose: 20 Gy
Reference Point Dosage Given to Date: 4 Gy
Reference Point Session Dosage Given: 4 Gy
Session Number: 1

## 2022-05-12 ENCOUNTER — Inpatient Hospital Stay (HOSPITAL_BASED_OUTPATIENT_CLINIC_OR_DEPARTMENT_OTHER): Payer: Medicare Other | Admitting: Hospice and Palliative Medicine

## 2022-05-12 DIAGNOSIS — C3491 Malignant neoplasm of unspecified part of right bronchus or lung: Secondary | ICD-10-CM | POA: Diagnosis not present

## 2022-05-12 NOTE — Progress Notes (Signed)
Virtual Visit via Telephone Note  I connected with Jared Mullen on 05/12/22 at 11:30 AM EDT by telephone and verified that I am speaking with the correct person using two identifiers.  Location: Patient: Home Provider: Clinic   I discussed the limitations, risks, security and privacy concerns of performing an evaluation and management service by telephone and the availability of in person appointments. I also discussed with the patient that there may be a patient responsible charge related to this service. The patient expressed understanding and agreed to proceed.   History of Present Illness: Jared Mullen is a 81 y.o. male with multiple medical problems including with multiple medical problems including with multiple medical problems including history of prostate cancer, sleep apnea, admitted to the hospital 11/10/2021 for evaluation of progressive weakness and weight loss.  CTA of the chest revealed a left upper lobe lung mass.  CT of the abdomen and pelvis revealed a lytic lesion to the right iliac crest.  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  Palliative care was consulted to help address goals.   Observations/Objective: I called and spoke with patient by phone  Patient is on [place] and seems to be tolerating well.  Patient denied any changes or concerns.  No symptomatic complaints.  No pain.  He says that his weight has fluctuated as has his appetite.  Overall, he feels like he is doing okay.  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  Depression/insomnia -continue trazodone  Follow Up Instructions: Telephone visit 1 to 2 months   I discussed the assessment and treatment plan with the patient. The patient was provided an opportunity to ask questions and all were answered. The patient agreed with the plan and demonstrated an understanding of the  instructions.   The patient was advised to call back or seek an in-person evaluation if the symptoms worsen or if the condition fails to improve as anticipated.  I provided 5 minutes of non-face-to-face time during this encounter.   [name], NP

## 2022-05-13 ENCOUNTER — Other Ambulatory Visit: Payer: Self-pay

## 2022-05-13 ENCOUNTER — Ambulatory Visit
Admission: RE | Admit: 2022-05-13 | Discharge: 2022-05-13 | Disposition: A | Discharge status: Home / Self Care | Payer: Medicare Other | Source: Ambulatory Visit | Attending: Radiation Oncology | Admitting: Radiation Oncology

## 2022-05-13 DIAGNOSIS — C7951 Secondary malignant neoplasm of bone: Secondary | ICD-10-CM | POA: Diagnosis not present

## 2022-05-13 LAB — RAD ONC ARIA SESSION SUMMARY
Course Elapsed Days: 2
Plan Fractions Treated to Date: 2
Plan Prescribed Dose Per Fraction: 4 Gy
Plan Total Fractions Prescribed: 5
Plan Total Prescribed Dose: 20 Gy
Reference Point Dosage Given to Date: 8 Gy
Reference Point Session Dosage Given: 4 Gy
Session Number: 2

## 2022-05-18 ENCOUNTER — Other Ambulatory Visit: Payer: Self-pay

## 2022-05-18 ENCOUNTER — Ambulatory Visit
Admission: RE | Admit: 2022-05-18 | Discharge: 2022-05-18 | Disposition: A | Discharge status: Home / Self Care | Payer: Medicare Other | Source: Ambulatory Visit | Attending: Radiation Oncology | Admitting: Radiation Oncology

## 2022-05-18 DIAGNOSIS — Z510 Encounter for antineoplastic radiation therapy: Secondary | ICD-10-CM | POA: Insufficient documentation

## 2022-05-18 DIAGNOSIS — C7951 Secondary malignant neoplasm of bone: Secondary | ICD-10-CM | POA: Insufficient documentation

## 2022-05-18 DIAGNOSIS — Z87891 Personal history of nicotine dependence: Secondary | ICD-10-CM | POA: Diagnosis not present

## 2022-05-18 DIAGNOSIS — C3412 Malignant neoplasm of upper lobe, left bronchus or lung: Secondary | ICD-10-CM | POA: Insufficient documentation

## 2022-05-18 LAB — RAD ONC ARIA SESSION SUMMARY
Course Elapsed Days: 7
Plan Fractions Treated to Date: 3
Plan Prescribed Dose Per Fraction: 4 Gy
Plan Total Fractions Prescribed: 5
Plan Total Prescribed Dose: 20 Gy
Reference Point Dosage Given to Date: 12 Gy
Reference Point Session Dosage Given: 4 Gy
Session Number: 3

## 2022-05-20 ENCOUNTER — Other Ambulatory Visit: Payer: Self-pay

## 2022-05-20 ENCOUNTER — Ambulatory Visit
Admission: RE | Admit: 2022-05-20 | Discharge: 2022-05-20 | Disposition: A | Discharge status: Home / Self Care | Payer: Medicare Other | Source: Ambulatory Visit | Attending: Radiation Oncology | Admitting: Radiation Oncology

## 2022-05-20 DIAGNOSIS — C7951 Secondary malignant neoplasm of bone: Secondary | ICD-10-CM | POA: Diagnosis not present

## 2022-05-20 LAB — RAD ONC ARIA SESSION SUMMARY
Course Elapsed Days: 9
Plan Fractions Treated to Date: 4
Plan Prescribed Dose Per Fraction: 4 Gy
Plan Total Fractions Prescribed: 5
Plan Total Prescribed Dose: 20 Gy
Reference Point Dosage Given to Date: 16 Gy
Reference Point Session Dosage Given: 4 Gy
Session Number: 4

## 2022-05-24 ENCOUNTER — Other Ambulatory Visit: Payer: Self-pay | Admitting: Oncology

## 2022-05-24 DIAGNOSIS — C3412 Malignant neoplasm of upper lobe, left bronchus or lung: Secondary | ICD-10-CM

## 2022-05-25 ENCOUNTER — Ambulatory Visit
Admission: RE | Admit: 2022-05-25 | Discharge: 2022-05-25 | Disposition: A | Discharge status: Home / Self Care | Payer: Medicare Other | Source: Ambulatory Visit | Attending: Radiation Oncology | Admitting: Radiation Oncology

## 2022-05-25 ENCOUNTER — Other Ambulatory Visit: Payer: Self-pay

## 2022-05-25 DIAGNOSIS — C7951 Secondary malignant neoplasm of bone: Secondary | ICD-10-CM | POA: Diagnosis not present

## 2022-05-25 LAB — RAD ONC ARIA SESSION SUMMARY
Course Elapsed Days: 14
Plan Fractions Treated to Date: 5
Plan Prescribed Dose Per Fraction: 4 Gy
Plan Total Fractions Prescribed: 5
Plan Total Prescribed Dose: 20 Gy
Reference Point Dosage Given to Date: 20 Gy
Reference Point Session Dosage Given: 4 Gy
Session Number: 5

## 2022-05-25 NOTE — Telephone Encounter (Signed)
CBC with Differential/Platelet Order: [id number] Status: Final result    Visible to patient: Yes (seen)    Next appt: Today at 11:30 AM in Radiation Oncology [facility])    Dx: Primary malignant neoplasm of right l...    0 Result Notes           Component Ref Range & Units 1 mo ago (04/19/22) 2 mo ago (03/19/22) 3 mo ago (02/10/22) 4 mo ago (01/13/22) 6 mo ago (11/16/21) 6 mo ago (11/15/21) 6 mo ago (11/14/21)  WBC 4.0 - 10.5 K/uL 5.3  5.2  6.2  6.2  8.6  9.8  10.7 High    RBC 4.22 - 5.81 MIL/uL 4.28  4.07 Low   4.14 Low   4.32  4.46  4.54  4.46   Hemoglobin 13.0 - 17.0 g/dL 13.0  12.4 Low   12.1 Low   12.5 Low   13.2  13.3  13.0   HCT 39.0 - 52.0 % 39.8  37.8 Low   37.2 Low   38.2 Low   40.2  40.0  39.4   MCV 80.0 - 100.0 fL 93.0  92.9  89.9  88.4  90.1  88.1  88.3   MCH 26.0 - 34.0 pg 30.4  30.5  29.2  28.9  29.6  29.3  29.1   MCHC 30.0 - 36.0 g/dL 32.7  32.8  32.5  32.7  32.8  33.3  33.0   RDW 11.5 - 15.5 % 14.7  15.7 High   16.6 High   15.2  13.3  13.5  13.5   Platelets 150 - 400 K/uL 123 Low   146 Low  CM  164  146 Low   231  231  230   Comment: SPECIMEN CHECKED FOR CLOTS  REPEATED TO VERIFY   nRBC 0.0 - 0.2 % 0.0  0.0  0.0  0.0  0.0 CM  0.0 CM  0.0 CM   Neutrophils Relative % % 64  66  68  67      Neutro Abs 1.7 - 7.7 K/uL 3.4  3.5  4.3  4.1      Lymphocytes Relative % 13  14  13  14       Lymphs Abs 0.7 - 4.0 K/uL 0.7  0.8  0.8  0.9      Monocytes Relative % 16  13  14  11       Monocytes Absolute 0.1 - 1.0 K/uL 0.9  0.7  0.9  0.7      Eosinophils Relative % 5  6  4  7       Eosinophils Absolute 0.0 - 0.5 K/uL 0.3  0.3  0.3  0.5      Basophils Relative % 1  1  1  1       Basophils Absolute 0.0 - 0.1 K/uL 0.0  0.0  0.0  0.1      Immature Granulocytes % 1  0  0  0      Abs Immature Granulocytes 0.00 - 0.07 K/uL 0.03  0.01 CM  0.01 CM  0.01 CM      Comment: Performed at [facility], [address] [place], [place] [address]  Resulting Agency  [facility] CLIN LAB [place] [facility] LAB [place] [facility]  LAB [place] CL[facility] LAB [place] [facility] LAB [place] [facility] [place] [facility]         Specimen Collected: 04/19/22 10:00 Last Resulted: 04/19/22 10:18      Lab Flowsheet  Order Details    View Encounter    Lab and Collection Details    Routing    Result History    View All Conversations on this Encounter      CM=Additional comments      Result Care Coordination   Patient Communication   Add Comments   Seen Back to Top       Other Results from 04/19/2022   Contains abnormal data Comprehensive metabolic panel Order: [id number] Status: Final result    Visible to patient: Yes (seen)    Next appt: Today at 11:30 AM in Radiation Oncology [facility])    Dx: Primary malignant neoplasm of right l...    0 Result Notes           Component Ref Range & Units 1 mo ago (04/19/22) 2 mo ago (03/19/22) 3 mo ago (02/10/22) 4 mo ago (01/13/22) 6 mo ago (11/16/21) 6 mo ago (11/15/21) 6 mo ago (11/14/21)  Sodium 135 - 145 mmol/L 138  138  132 Low   134 Low   136  137  135   Potassium 3.5 - 5.1 mmol/L 4.7  4.9  4.1  4.3  4.5  4.3  4.0   Chloride 98 - 111 mmol/L 102  103  97 Low   100  99  99  101   CO2 22 - 32 mmol/L 30  30  29  29  30  29  28    Glucose, Bld 70 - 99 mg/dL 105 High   108 High  CM  121 High  CM  111 High  CM  105 High  CM  111 High  CM  111 High  CM   Comment: Glucose reference range applies only to samples taken after fasting for at least 8 hours.  BUN 8 - 23 mg/dL 16  15  15  12  14  22  24  High    Creatinine, Ser 0.61 - 1.24 mg/dL 0.78  0.78  0.68  0.71  0.87  0.91  0.62   Calcium 8.9 - 10.3 mg/dL 9.0  9.0  8.9  8.9  8.9  9.5  9.1   Total Protein 6.5 - 8.1 g/dL 6.8  7.1  7.7  7.0      Albumin 3.5 - 5.0 g/dL 3.6  3.5  3.4 Low   3.1 Low       AST 15 - 41 U/L 20  19  19  21       ALT 0 - 44 U/L 15  15  17  20       Alkaline Phosphatase 38 - 126 U/L 51  50  71  74      Total Bilirubin 0.3 - 1.2 mg/dL 1.2  0.6  0.8  0.7      GFR, Estimated >60 mL/min >60  >60 CM  >60 CM  >60 CM  >60 CM  >60 CM  >60 CM    Comment: (NOTE)  Calculated using the CKD-EPI Creatinine Equation (2021)   Anion gap 5 - 15 6  5  CM  6 CM  5 CM  7 CM  9 CM  6 CM   Comment: Performed at [facility], [address] [place], [place] [address]  Resulting Agency  [facility] CLIN LAB [place] [facility] [place] [facility] [place] [facility] [place] [facility] [place] [facility] [place] [facility]         Specimen Collected: 04/19/22 10:00 Last Resulted: 04/19/22 10:27

## 2022-05-26 ENCOUNTER — Ambulatory Visit: Payer: Medicare Other | Admitting: Radiation Oncology

## 2022-06-07 ENCOUNTER — Ambulatory Visit
Admission: RE | Admit: 2022-06-07 | Discharge: 2022-06-07 | Disposition: A | Discharge status: Home / Self Care | Payer: Medicare Other | Source: Ambulatory Visit | Attending: Oncology | Admitting: Oncology

## 2022-06-07 ENCOUNTER — Encounter
Admission: RE | Admit: 2022-06-07 | Discharge: 2022-06-07 | Disposition: A | Discharge status: Home / Self Care | Payer: Medicare Other | Source: Ambulatory Visit | Attending: Oncology | Admitting: Oncology

## 2022-06-07 DIAGNOSIS — C3491 Malignant neoplasm of unspecified part of right bronchus or lung: Secondary | ICD-10-CM | POA: Insufficient documentation

## 2022-06-07 LAB — POCT I-STAT CREATININE: Creatinine, Ser: 1 mg/dL (ref 0.61–1.24)

## 2022-06-07 MED ORDER — TECHNETIUM TC 99M MEDRONATE IV KIT
20.0000 | PACK | Freq: Once | INTRAVENOUS | Status: AC | PRN
  Administered 2022-06-07: 21.49 via INTRAVENOUS

## 2022-06-07 MED ORDER — IOHEXOL 300 MG/ML  SOLN
100.0000 mL | Freq: Once | INTRAMUSCULAR | Status: AC | PRN
  Administered 2022-06-07: 100 mL via INTRAVENOUS

## 2022-06-17 ENCOUNTER — Ambulatory Visit: Admission: RE | Admit: 2022-06-17 | Payer: Medicare Other | Source: Ambulatory Visit

## 2022-06-23 ENCOUNTER — Inpatient Hospital Stay: Payer: Medicare Other | Attending: Hospice and Palliative Medicine | Admitting: Oncology

## 2022-06-23 ENCOUNTER — Encounter: Payer: Self-pay | Admitting: Oncology

## 2022-06-23 ENCOUNTER — Inpatient Hospital Stay: Payer: Medicare Other

## 2022-06-23 VITALS — BP 119/82 | HR 80 | Temp 96.4°F | Resp 16 | Wt 146.3 lb

## 2022-06-23 DIAGNOSIS — Z7189 Other specified counseling: Secondary | ICD-10-CM | POA: Diagnosis not present

## 2022-06-23 DIAGNOSIS — Z801 Family history of malignant neoplasm of trachea, bronchus and lung: Secondary | ICD-10-CM | POA: Diagnosis not present

## 2022-06-23 DIAGNOSIS — C3491 Malignant neoplasm of unspecified part of right bronchus or lung: Secondary | ICD-10-CM | POA: Diagnosis not present

## 2022-06-23 DIAGNOSIS — Z803 Family history of malignant neoplasm of breast: Secondary | ICD-10-CM | POA: Insufficient documentation

## 2022-06-23 DIAGNOSIS — Z8546 Personal history of malignant neoplasm of prostate: Secondary | ICD-10-CM | POA: Diagnosis not present

## 2022-06-23 DIAGNOSIS — C7951 Secondary malignant neoplasm of bone: Secondary | ICD-10-CM | POA: Diagnosis not present

## 2022-06-23 DIAGNOSIS — C3412 Malignant neoplasm of upper lobe, left bronchus or lung: Secondary | ICD-10-CM | POA: Insufficient documentation

## 2022-06-23 DIAGNOSIS — Z923 Personal history of irradiation: Secondary | ICD-10-CM | POA: Diagnosis not present

## 2022-06-23 DIAGNOSIS — N486 Induration penis plastica: Secondary | ICD-10-CM | POA: Diagnosis not present

## 2022-06-23 DIAGNOSIS — G4730 Sleep apnea, unspecified: Secondary | ICD-10-CM | POA: Insufficient documentation

## 2022-06-23 DIAGNOSIS — Z79899 Other long term (current) drug therapy: Secondary | ICD-10-CM | POA: Diagnosis not present

## 2022-06-23 LAB — COMPREHENSIVE METABOLIC PANEL
ALT: 16 U/L (ref 0–44)
AST: 22 U/L (ref 15–41)
Albumin: 3.9 g/dL (ref 3.5–5.0)
Alkaline Phosphatase: 49 U/L (ref 38–126)
Anion gap: 5 (ref 5–15)
BUN: 23 mg/dL (ref 8–23)
CO2: 30 mmol/L (ref 22–32)
Calcium: 9.6 mg/dL (ref 8.9–10.3)
Chloride: 102 mmol/L (ref 98–111)
Creatinine, Ser: 0.97 mg/dL (ref 0.61–1.24)
GFR, Estimated: >60 mL/min (ref >60)
Glucose, Bld: 111 mg/dL — ABNORMAL HIGH (ref 70–99)
Potassium: 4.1 mmol/L (ref 3.5–5.1)
Sodium: 137 mmol/L (ref 135–145)
Total Bilirubin: 1.3 mg/dL — ABNORMAL HIGH (ref 0.3–1.2)
Total Protein: 7.2 g/dL (ref 6.5–8.1)

## 2022-06-23 LAB — CBC WITH DIFFERENTIAL/PLATELET
Abs Immature Granulocytes: 0.01 10*3/uL (ref 0.00–0.07)
Basophils Absolute: 0 10*3/uL (ref 0.0–0.1)
Basophils Relative: 1 %
Eosinophils Absolute: 0.2 10*3/uL (ref 0.0–0.5)
Eosinophils Relative: 5 %
HCT: 38.7 % — ABNORMAL LOW (ref 39.0–52.0)
Hemoglobin: 12.8 g/dL — ABNORMAL LOW (ref 13.0–17.0)
Immature Granulocytes: 0 %
Lymphocytes Relative: 10 %
Lymphs Abs: 0.4 10*3/uL — ABNORMAL LOW (ref 0.7–4.0)
MCH: 30.5 pg (ref 26.0–34.0)
MCHC: 33.1 g/dL (ref 30.0–36.0)
MCV: 92.4 fL (ref 80.0–100.0)
Monocytes Absolute: 0.6 10*3/uL (ref 0.1–1.0)
Monocytes Relative: 15 %
Neutro Abs: 2.6 10*3/uL (ref 1.7–7.7)
Neutrophils Relative %: 69 %
Platelets: 109 10*3/uL — ABNORMAL LOW (ref 150–400)
RBC: 4.19 MIL/uL — ABNORMAL LOW (ref 4.22–5.81)
RDW: 14.7 % (ref 11.5–15.5)
WBC: 3.8 10*3/uL — ABNORMAL LOW (ref 4.0–10.5)
nRBC: 0 % (ref 0.0–0.2)

## 2022-06-23 LAB — TSH: TSH: 3.085 u[IU]/mL (ref 0.350–4.500)

## 2022-06-23 NOTE — Progress Notes (Signed)
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  CT chest showed left perihilar upper lobe masslike opacity measuring 6.6 x 5 x 6.1 cm contiguous with the hilum.  Left hilar adenopathy as well as paratracheal adenopathy.  Severe narrowing of the left upper lobe bronchus due to left perihilar mass.  CT abdomen and pelvis with contrast showed large lytic lesion involving the right iliac bone.  MRI brain with and without contrast did not show any evidence of metastatic disease.  Patient underwent bronchoscopy with biopsy of the left hilar mass which was consistent with adenocarcinoma.   NGS testing showed EGFR L858R mutation that could be targetable with Tagrisso.  Tumor mutational burden high.  PD-L1 less than 1%.  No other actionable mutations.    Interval history-patient is doing well.  He has been compliant with Tagrisso and denies any specific complaints at this time  ECOG PS- 1 Pain scale- 0   Review of systems- Review of Systems  Constitutional:  Negative for chills, fever, malaise/fatigue and weight loss.  HENT:  Negative for congestion, ear discharge and nosebleeds.   Eyes:  Negative for blurred vision.  Respiratory:  Negative for cough, hemoptysis, sputum production,  shortness of breath and wheezing.   Cardiovascular:  Negative for chest pain, palpitations, orthopnea and claudication.  Gastrointestinal:  Negative for abdominal pain, blood in stool, constipation, diarrhea, heartburn, melena, nausea and vomiting.  Genitourinary:  Negative for dysuria, flank pain, frequency, hematuria and urgency.  Musculoskeletal:  Negative for back pain, joint pain and myalgias.  Skin:  Negative for rash.  Neurological:  Negative for dizziness, tingling, focal weakness, seizures, weakness and headaches.  Endo/Heme/Allergies:  Does not bruise/bleed easily.  Psychiatric/Behavioral:  Negative for depression and suicidal ideas. The patient does not have insomnia.       No Known Allergies   Past Medical History:  Diagnosis Date   Abnormal ECG    a. baseline inferolateral ST elevation dating back to at least 2012.   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   Sleep apnea    Urinary frequency    Weak urinary stream    Weight loss      Past Surgical History:  Procedure Laterality Date   biospy  09/30/2011-   TRUS/Bx Prostate - 5/12/biopsies positive for cancer, glandular size 32.5 cc   CHOLECYSTECTOMY  1992   RADIOACTIVE SEED IMPLANT  02/09/2012   Procedure: RADIOACTIVE SEED IMPLANT;  Surgeon: Franchot Gallo, MD;  Location: [facility];  Service: Urology;  Laterality: N/A;  RAD TECH OK PER ANNE AT MAIN OR    UVULOPALATOPHARYNGOPLASTY     VIDEO BRONCHOSCOPY WITH ENDOBRONCHIAL ULTRASOUND Bilateral 11/12/2021  Procedure: VIDEO BRONCHOSCOPY WITH ENDOBRONCHIAL ULTRASOUND;  Surgeon: Ottie Glazier, MD;  Location: [facility];  Service: Thoracic;  Laterality: Bilateral;    Social History   Socioeconomic History   Marital status: Widowed    Spouse name: Not on file   Number of children: 3   Years of education: Not on  file   Highest education level: High school graduate  Occupational History   Occupation: retired  Tobacco Use   Smoking status: Former    Packs/day: 2.00    Years: 28.00    Total pack years: 56.00    Types: Cigarettes    Quit date: 10/18/1980    Years since quitting: 41.7   Smokeless tobacco: Never  Substance and Sexual Activity   Alcohol use: No   Drug use: No   Sexual activity: Not on file    Comment: Low libido  Other Topics Concern   Not on file  Social History Narrative   Lives locally.  Was caring for his wife until she passed in December.  Sedentary.   Social Determinants of Health   Financial Resource Strain: Low Risk  (05/14/2019)   Overall Financial Resource Strain (CARDIA)    Difficulty of Paying Living Expenses: Not hard at all  Food Insecurity: No Food Insecurity (05/14/2019)   Hunger Vital Sign    Worried About Running Out of Food in the Last Year: Never true    Ran Out of Food in the Last Year: Never true  Transportation Needs: No Transportation Needs (05/14/2019)   PRAPARE - Hydrologist (Medical): No    Lack of Transportation (Non-Medical): No  Physical Activity: Inactive (05/14/2019)   Exercise Vital Sign    Days of Exercise per Week: 0 days    Minutes of Exercise per Session: 0 min  Stress: No Stress Concern Present (05/14/2019)   [place]    Feeling of Stress : Not at all  Social Connections: Unknown (05/14/2019)   Social Connection and Isolation Panel [NHANES]    Frequency of Communication with Friends and Family: Patient refused    Frequency of Social Gatherings with Friends and Family: Patient refused    Attends Religious Services: Patient refused    Active Member of Clubs or Organizations: Patient refused    Attends Archivist Meetings: Patient refused    Marital Status: Patient refused  Intimate Partner Violence: Unknown (05/14/2019)    Humiliation, Afraid, Rape, and Kick questionnaire    Fear of Current or Ex-Partner: Patient refused    Emotionally Abused: Patient refused    Physically Abused: Patient refused    Sexually Abused: Patient refused    Family History  Problem Relation Age of Onset   Heart disease Father    Breast cancer Sister    Cancer Sister        lung   Lung cancer Brother    Cancer Brother        bladder   Lung cancer Sister    Cancer Sister        breast   Kidney disease Brother      Current Outpatient Medications:    TAGRISSO 80 MG tablet, TAKE ONE TABLET BY MOUTH ONCE DAILY AT THE SAME TIME. MAY TAKE WITH OR WITHOUT FOOD., Disp: 15 tablet, Rfl: 1   acetaminophen (TYLENOL) 325 MG tablet, Take 650 mg by mouth every 6 (six) hours as needed. (Patient not taking: Reported on 06/23/2022), Disp: , Rfl:  ondansetron (ZOFRAN) 4 MG tablet, Take 1 tablet (4 mg total) by mouth every 8 (eight) hours as needed for nausea or vomiting. (Patient not taking: Reported on 02/10/2022), Disp: 45 tablet, Rfl: 0   prochlorperazine (COMPAZINE) 10 MG tablet, Take 1 tablet (10 mg total) by mouth every 6 (six) hours as needed for nausea or vomiting. (Patient not taking: Reported on 02/10/2022), Disp: 45 tablet, Rfl: 0  Physical exam:  Vitals:   06/23/22 0944  BP: 119/82  Pulse: 80  Resp: 16  Temp: (!) 96.4 F (35.8 C)  SpO2: 100%  Weight: 146 lb 4.8 oz (66.4 kg)   Physical Exam Cardiovascular:     Rate and Rhythm: Normal rate and regular rhythm.     Heart sounds: Normal heart sounds.  Pulmonary:     Effort: Pulmonary effort is normal.     Breath sounds: Normal breath sounds.  Abdominal:     General: Bowel sounds are normal.     Palpations: Abdomen is soft.  Skin:    General: Skin is warm and dry.  Neurological:     Mental Status: He is alert and oriented to person, place, and time.         Latest Ref Rng & Units 06/23/2022    9:30 AM  CMP  Glucose 70 - 99 mg/dL 111   BUN 8 - 23 mg/dL 23    Creatinine 0.61 - 1.24 mg/dL 0.97   Sodium 135 - 145 mmol/L 137   Potassium 3.5 - 5.1 mmol/L 4.1   Chloride 98 - 111 mmol/L 102   CO2 22 - 32 mmol/L 30   Calcium 8.9 - 10.3 mg/dL 9.6   Total Protein 6.5 - 8.1 g/dL 7.2   Total Bilirubin 0.3 - 1.2 mg/dL 1.3   Alkaline Phos 38 - 126 U/L 49   AST 15 - 41 U/L 22   ALT 0 - 44 U/L 16       Latest Ref Rng & Units 06/23/2022    9:30 AM  CBC  WBC 4.0 - 10.5 K/uL 3.8   Hemoglobin 13.0 - 17.0 g/dL 12.8   Hematocrit 39.0 - 52.0 % 38.7   Platelets 150 - 400 K/uL 109     No images are attached to the encounter.  NM Bone Scan Whole Body  Result Date: 06/09/2022 CLINICAL DATA:  Metastatic lung cancer with right iliac bone lesion. EXAM: NUCLEAR MEDICINE WHOLE BODY BONE SCAN TECHNIQUE: Whole body anterior and posterior images were obtained approximately 3 hours after intravenous injection of radiopharmaceutical. RADIOPHARMACEUTICALS:  23.44 mCi Technetium-58mMDP IV COMPARISON:  Whole-body bone scan 03/12/2022, chest, abdomen and pelvis CT 03/12/2022, and chest, abdomen or pelvis CP 06/07/2022. FINDINGS: Abnormal tracer deposition is again noted in the right iliac wing in the location of the known lytic destructive lesion, on today's CT measuring 4.3 x 2.2 cm and not visually significantly changed compared to prior exam. There is again noted slight thoracic dextroscoliosis, and multilevel mild degenerative changes in the thoracolumbar spine. Additional mild symmetric degenerative changes are noted of the shoulders. There is intense radiotracer deposition in the anterior end of the right fourth rib, on CT corresponds to a partially healed nondisplaced rib fracture not seen previously. This does not grossly appear to be pathologic such as through a focal bone lesion. No other new or significant skeletal activity is seen. Soft tissue and renal uptake are unremarkable. IMPRESSION: 1. Activity in the location of the known lytic metastasis in the right iliac wing.  No  obvious increase in size. 2. Intense deposition in the anterior end of the right fourth rib, corresponding to a partially healed fracture on CT which grossly does not appear to have been a pathologic fracture. 3. Degenerative changes. Electronically Signed   By: Telford Nab M.D.   On: 06/09/2022 03:15   CT CHEST ABDOMEN PELVIS W CONTRAST  Result Date: 06/08/2022 CLINICAL DATA:  Metastatic lung cancer EXAM: CT CHEST, ABDOMEN, AND PELVIS WITH CONTRAST TECHNIQUE: Multidetector CT imaging of the chest, abdomen and pelvis was performed following the standard protocol during bolus administration of intravenous contrast. RADIATION DOSE REDUCTION: This exam was performed according to the departmental dose-optimization program which includes automated exposure control, adjustment of the mA and/or kV according to patient size and/or use of iterative reconstruction technique. CONTRAST:  137m OMNIPAQUE IOHEXOL 300 MG/ML  SOLN COMPARISON:  03/12/2022 FINDINGS: CT CHEST FINDINGS Cardiovascular: Mild coronary artery calcification. Global cardiac size within normal limits. No pericardial effusion. Central pulmonary arteries are of normal caliber. Mild atherosclerotic calcification within the thoracic aorta. No aortic aneurysm. Mediastinum/Nodes: Visualized thyroid is unremarkable. Esophagus is unremarkable. Mediastinal adenopathy is stable: AP window node, image # 24/2, 9 mm, stable AP window node, image # 26/2, 14 mm, stable Subcarinal lymph node, image # 30/2, 11 mm, stable Left hilar lymph node, image # 31/2, 12 mm, stable Lungs/Pleura: Spiculated left hilar mass is again identified narrowing and occluding several bronchi of the left upper lobe with resultant left upper lobe volume loss. The mass measures 3.6 x 4.4 cm at axial image # 63/4, stable when measured in identical fashion. Previously noted satellite nodule within the peripheral left upper lobe has decreased in size, measuring 0.5 x 1.5 cm at axial image #  37/4. Ground-glass pulmonary infiltrate within the left upper lobe has progressed slightly in the interval since prior examination possibly reflecting changes of post radiation pneumonitis multiple nodular densities within the left upper lobe anteriorly and axial image # 67/4, are nonspecific and may simply represent the sequela of radiation therapy. Bilateral calcified pleural plaques are again identified. Subpleural consolidation within the left lower lobe appears stable at axial image # 119/4 likely representing subpleural fibrotic change. Bandlike scarring noted within the right lower lobe. No pneumothorax or pleural effusion. Central airways are widely patent. Musculoskeletal: No acute bone abnormality. No lytic or blastic bone lesion. CT ABDOMEN PELVIS FINDINGS Hepatobiliary: Innumerable hepatic cyst or biliary hamartoma are again identified scattered throughout the liver, predominantly within the right hepatic lobe, grossly unchanged from prior examination. No enhancing intrahepatic mass. Cholecystectomy has been performed. No intra or extrahepatic biliary ductal dilation. Pancreas: Unremarkable Spleen: Unremarkable Adrenals/Urinary Tract: The adrenal glands are unremarkable. The kidneys are normal. Bladder is unremarkable. Stomach/Bowel: Stomach is within normal limits. Appendix appears normal. No evidence of bowel wall thickening, distention, or inflammatory changes. Vascular/Lymphatic: Aortic atherosclerosis. No enlarged abdominal or pelvic lymph nodes. Reproductive: Brachytherapy seeds are seen within the prostate gland. Other: Tiny fat containing umbilical hernia. Musculoskeletal: Lytic lesion within the right ilium is stable in size but demonstrates increasing marginal sclerosis in keeping with response of therapy. No new lytic or blastic bone lesions are identified. IMPRESSION: 1. Mixed response to therapy. Stable primary left hilar mass and mediastinal adenopathy. Interval response to therapy with  decrease in size of satellite nodule within the left apex. Increasing sclerosis involving the isolated lytic metastasis within the right ilium in keeping with response to therapy. 2. Progressive ground-glass pulmonary infiltrate within the left upper lobe, possibly reflecting changes of  post radiation pneumonitis. Associated nodular densities within the left upper lobe may simply reflect changes related to radiation therapy, however, close attention on subsequent examination is warranted Aortic Atherosclerosis (ICD10-I70.0). Electronically Signed   By: Fidela Salisbury M.D.   On: 06/08/2022 04:17     Assessment and plan- Patient is a 81 y.o. male with EGFR positive lung cancer with bone metastases currently on Tagrisso here for routine follow-up  I have reviewed CT chest abdomen and pelvis images independently and discussed findings with the patient which shows stable disease in his lungs as well as right iliac bone lesion.  He has also received palliative radiation to both these areas.  He will continue Tagrisso until progression or toxicity at this time.  Patient does have bone metastases and would qualify for Zometa to be given on a monthly basis to reduce the risk of future skeletal events.  This was discussed with him in the past as well.  Today he is more receptive to this discussion and is willing to consider it.  We will obtain dental clearance prior to starting Zometa given the risk of osteonecrosis of jaw.  I will see him back in 2 months with labs and if we obtain dental clearance by then we will proceed with his first dose of Zometa at that time   Visit Diagnosis 1. Primary malignant neoplasm of right lung metastatic to other site [facility])   2. Goals of care, counseling/discussion   3. High risk medication use      Dr. Randa Evens, MD, MPH [facility] at [facility] [phone] 06/23/2022 5:05 PM

## 2022-06-24 ENCOUNTER — Telehealth: Payer: Self-pay

## 2022-06-24 NOTE — Telephone Encounter (Signed)
Dental clearance has been emailed to [employer] at [email or web address] due to multiple tries via fax and kees failing; have recieved a email confirmation.

## 2022-07-01 ENCOUNTER — Other Ambulatory Visit: Payer: Self-pay | Admitting: Medical Oncology

## 2022-07-01 DIAGNOSIS — C3412 Malignant neoplasm of upper lobe, left bronchus or lung: Secondary | ICD-10-CM

## 2022-07-01 MED ORDER — OSIMERTINIB MESYLATE 80 MG PO TABS: ORAL_TABLET | ORAL | 1 refills | Status: DC

## 2022-07-07 ENCOUNTER — Telehealth: Payer: Self-pay | Admitting: Oncology

## 2022-07-07 NOTE — Telephone Encounter (Signed)
Patients daughter called to cancel upcoming appointment. Jared Mullen states her father does not want the treatment. Appointments have been cancelled for 11/7. Thank you message left for Jared Mullen to call back if they need anything.

## 2022-07-08 ENCOUNTER — Other Ambulatory Visit: Payer: Self-pay | Admitting: *Deleted

## 2022-07-08 ENCOUNTER — Ambulatory Visit
Admission: RE | Admit: 2022-07-08 | Discharge: 2022-07-08 | Disposition: A | Discharge status: Home / Self Care | Payer: Medicare Other | Source: Ambulatory Visit | Attending: Radiation Oncology | Admitting: Radiation Oncology

## 2022-07-08 VITALS — BP 122/76 | HR 73 | Temp 96.3°F | Resp 18 | Ht 69.5 in | Wt 151.4 lb

## 2022-07-08 DIAGNOSIS — C3412 Malignant neoplasm of upper lobe, left bronchus or lung: Secondary | ICD-10-CM | POA: Diagnosis not present

## 2022-07-08 DIAGNOSIS — C7951 Secondary malignant neoplasm of bone: Secondary | ICD-10-CM | POA: Insufficient documentation

## 2022-07-08 DIAGNOSIS — Z923 Personal history of irradiation: Secondary | ICD-10-CM | POA: Insufficient documentation

## 2022-07-08 NOTE — Progress Notes (Signed)
Radiation Oncology Follow up Note  Name: Jared Mullen   Date:   07/08/2022 MRN:  [id number] DOB: 24-Aug-1941    This 81 y.o. 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  REFERRING PROVIDER: Birdie Sons, MD  HPI: Patient is an 81 year old male now at 1 month having completed both radiation therapy to his chest as well as.  His right iliac bone.  As well as left upper lobe nodule measuring 2.7 cm was treated with SBRT as well as the lytic lesion in his right iliac bone.  Seen today in routine follow-up he is doing well he specifically denies cough hemoptysis or chest tightness.  He does have some right chest pain over the last few days although he washed his car last week may be related to some muscle strain.  He does have a bone scan back in August which did show intense deposition in the right fourth rib not thought to be associated with pathologic fracture but there was a partially healed fracture.  He also had a CT scan back in August showing mixed response to therapy with stable primary left hilar mass and mediastinal adenopathy with decrease in size of the satellite nodule in the right left apex which we treated.  He is currently on Tagrisso which she is tolerating well.  COMPLICATIONS OF TREATMENT: none  FOLLOW UP COMPLIANCE: keeps appointments   PHYSICAL EXAM:  BP 122/76   Pulse 73   Temp (!) 96.3 F (35.7 C)   Resp 18   Ht 5' 9.5" (1.765 m)   Wt 151 lb 6.4 oz (68.7 kg)   BMI 22.04 kg/m  Deep palpation of his right chest does not elicit pain.  Well-developed well-nourished patient in NAD. HEENT reveals PERLA, EOMI, discs not visualized.  Oral cavity is clear. No oral mucosal lesions are identified. Neck is clear without evidence of cervical or supraclavicular adenopathy. Lungs are clear to A&P. Cardiac examination is essentially  unremarkable with regular rate and rhythm without murmur rub or thrill. Abdomen is benign with no organomegaly or masses noted. Motor sensory and DTR levels are equal and symmetric in the upper and lower extremities. Cranial nerves II through XII are grossly intact. Proprioception is intact. No peripheral adenopathy or edema is identified. No motor or sensory levels are noted. Crude visual fields are within normal range.  RADIOLOGY RESULTS: CT scan reviewed as well as bone scan compatible with above-stated findings  PLAN: Present time patient continues treatment through medical oncology.  He appears stable at this point in time.  He continues on Tagrisso.  I have asked to see him back in 4 to 5 months.  We will review any CT scans that become available.  Patient and family know to call with any concerns.  I would like to take this opportunity to thank you for allowing me to participate in the care of your patient.Noreene Filbert, MD

## 2022-07-12 ENCOUNTER — Other Ambulatory Visit: Payer: Self-pay | Admitting: *Deleted

## 2022-07-12 ENCOUNTER — Other Ambulatory Visit: Payer: Self-pay

## 2022-07-12 DIAGNOSIS — C3412 Malignant neoplasm of upper lobe, left bronchus or lung: Secondary | ICD-10-CM

## 2022-07-12 DIAGNOSIS — C3491 Malignant neoplasm of unspecified part of right bronchus or lung: Secondary | ICD-10-CM

## 2022-07-13 ENCOUNTER — Inpatient Hospital Stay (HOSPITAL_BASED_OUTPATIENT_CLINIC_OR_DEPARTMENT_OTHER): Payer: Medicare Other | Admitting: Hospice and Palliative Medicine

## 2022-07-13 DIAGNOSIS — C3412 Malignant neoplasm of upper lobe, left bronchus or lung: Secondary | ICD-10-CM

## 2022-07-13 DIAGNOSIS — Z515 Encounter for palliative care: Secondary | ICD-10-CM

## 2022-07-13 NOTE — Progress Notes (Signed)
Virtual Visit via Telephone Note  I connected with [name] on 07/13/22 at 11:30 AM EDT by telephone and verified that I am speaking with the correct person using two identifiers.  Location: Patient: Home Provider: Clinic   I discussed the limitations, risks, security and privacy concerns of performing an evaluation and management service by telephone and the availability of in person appointments. I also discussed with the patient that there may be a patient responsible charge related to this service. The patient expressed understanding and agreed to proceed.   History of Present Illness: Jared Mullen is a 81 y.o. male with multiple medical problems including with multiple medical problems including with multiple medical problems including history of prostate cancer, sleep apnea, admitted to the hospital 11/10/2021 for evaluation of progressive weakness and weight loss.  CTA of the chest revealed a left upper lobe lung mass.  CT of the abdomen and pelvis revealed a lytic lesion to the right iliac crest.  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  Palliative care was consulted to help address goals.   Observations/Objective: I called and spoke with patient by phone  Patient is on [place] and seems to be tolerating well.  Patient denies significant changes.  He does endorse some mild mid back pain over the past couple of months.  Patient says that the pain is intermittent and does not require medication.  He says that this was discussed with Dr. Baruch Gouty with plan for repeat CTs prior to patient returning in January.  Of note, bone scan in August 2023 revealed multilevel degenerative changes in the thoracolumbar spine.  Discussed with patient that if pain worsens we can certainly bring him into the clinic to evaluate and consider repeat imaging.  He felt like he was fine for now.  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  Depression/insomnia -continue trazodone  Follow Up Instructions: Telephone visit 1 to 2 months   I discussed the assessment and treatment plan with the patient. The patient was provided an opportunity to ask questions and all were answered. The patient agreed with the plan and demonstrated an understanding of the instructions.   The patient was advised to call back or seek an in-person evaluation if the symptoms worsen or if the condition fails to improve as anticipated.  I provided 10 minutes of non-face-to-face time during this encounter.   [name], NP

## 2022-07-14 ENCOUNTER — Encounter: Payer: Self-pay | Admitting: *Deleted

## 2022-07-15 ENCOUNTER — Telehealth: Payer: Self-pay

## 2022-07-15 NOTE — Telephone Encounter (Signed)
Pt decided he did not want any Bisphosphonates treatment and wanted to cancel his appointment regarding the tx. However, pt did not mean cancel all future appointments. Pt will return in order to see Dr. Janese Banks for future follow up appts.

## 2022-07-21 ENCOUNTER — Other Ambulatory Visit: Payer: Self-pay | Admitting: Medical Oncology

## 2022-07-21 DIAGNOSIS — C3412 Malignant neoplasm of upper lobe, left bronchus or lung: Secondary | ICD-10-CM

## 2022-07-21 MED ORDER — OSIMERTINIB MESYLATE 80 MG PO TABS: ORAL_TABLET | ORAL | 1 refills | Status: DC

## 2022-07-28 ENCOUNTER — Other Ambulatory Visit: Payer: Self-pay | Admitting: *Deleted

## 2022-07-28 DIAGNOSIS — C3412 Malignant neoplasm of upper lobe, left bronchus or lung: Secondary | ICD-10-CM

## 2022-07-28 MED ORDER — OSIMERTINIB MESYLATE 80 MG PO TABS: ORAL_TABLET | ORAL | 3 refills | Status: DC

## 2022-07-29 ENCOUNTER — Ambulatory Visit
Admission: RE | Admit: 2022-07-29 | Discharge: 2022-07-29 | Disposition: A | Discharge status: Home / Self Care | Payer: Medicare Other | Source: Ambulatory Visit | Attending: Hospice and Palliative Medicine | Admitting: Hospice and Palliative Medicine

## 2022-07-29 ENCOUNTER — Telehealth: Payer: Self-pay | Admitting: *Deleted

## 2022-07-29 DIAGNOSIS — C3412 Malignant neoplasm of upper lobe, left bronchus or lung: Secondary | ICD-10-CM | POA: Insufficient documentation

## 2022-07-29 MED ORDER — GADOBUTROL 1 MMOL/ML IV SOLN
6.0000 mL | Freq: Once | INTRAVENOUS | Status: AC | PRN
  Administered 2022-07-29: 6 mL via INTRAVENOUS

## 2022-07-29 NOTE — Telephone Encounter (Signed)
alyson- fyi. also we just rcvd an rx renewal for his Tagrisso. robin in HIM is bringing it to you.

## 2022-07-29 NOTE — Telephone Encounter (Signed)
I called and spoke with patient.  He says that he has not lost vision but has had some visual acuity changes over the past few weeks.  He says that he has had some blurry vision in both eyes, tearing, and feeling like there is a gritty sensation in both eyes.  He denies foreign body sensation.  No itchiness or redness.  He says that he can see clearly out of each eye when he closes the other.  He denies headaches, weakness, dizziness, or any other neurological symptoms.  Patient states that he has a history of retinal detachments and is known to have bilateral cataracts, which have so far been unable to be operated given his other health conditions.  I strongly encouraged patient to go to the ED to obtain brain imaging but he was reluctant to do so.  Will order stat MRI of the brain but explained to patient that this could potentially take a couple of weeks to obtain and that he would need to go to the ED for any worsening or changing in symptoms.  Patient has called his ophthalmologist at the [facility] but they were unable to see him before 10/31.  Patient states that he previously saw Dr. Sandra Cockayne at [facility].  We will see if we can send an urgent referral for sooner follow-up.

## 2022-07-29 NOTE — Telephone Encounter (Signed)
Per Merrily Pew- NP spoke with daughter. advised to go to the ER. pt refusing ER. Josh asked me to call Dr. Lewayne Bunting office at [facility] eye. pt has had a h/o retinal detachment. I just tried calling but the office is closed b/w 12-1pm for lunch. I will have to try again after lunch to get a stat apt.  Per NP- pt needs stat mri brain. I will see if the schedulers can arrange and PA team to obtain auth.

## 2022-07-29 NOTE — Telephone Encounter (Signed)
I spoke again with patient. He says he wants to hold off on the referral to [facility] and would prefer to just keep his Jared Mullen appointment.

## 2022-07-29 NOTE — Telephone Encounter (Signed)
Jared Mullen called on behalf of patient asking that Jared Mullen please call him back [phone]. He has lost vision in one of his eyes.

## 2022-08-15 ENCOUNTER — Encounter: Payer: Self-pay | Admitting: Hospice and Palliative Medicine

## 2022-08-16 ENCOUNTER — Inpatient Hospital Stay: Payer: Medicare Other | Attending: Hospice and Palliative Medicine | Admitting: Hospice and Palliative Medicine

## 2022-08-16 DIAGNOSIS — L989 Disorder of the skin and subcutaneous tissue, unspecified: Secondary | ICD-10-CM | POA: Insufficient documentation

## 2022-08-16 DIAGNOSIS — Z923 Personal history of irradiation: Secondary | ICD-10-CM | POA: Diagnosis not present

## 2022-08-16 DIAGNOSIS — C3412 Malignant neoplasm of upper lobe, left bronchus or lung: Secondary | ICD-10-CM | POA: Diagnosis present

## 2022-08-16 DIAGNOSIS — Z8546 Personal history of malignant neoplasm of prostate: Secondary | ICD-10-CM | POA: Insufficient documentation

## 2022-08-16 NOTE — Telephone Encounter (Signed)
Pt came to clinic today and discussed these concerns with josh directly

## 2022-08-16 NOTE — Progress Notes (Signed)
Symptom Management [place] at [facility] Telephone:(336) [phone] Fax:(336) [phone]  Patient Care Team: Birdie Sons, MD as PCP - General (Family Medicine) Rockey Situ Kathlene November, MD as PCP - Cardiology (Cardiology) Franchot Gallo, MD as Consulting Physician (Urology) Telford Nab, RN as Oncology Nurse Navigator   NAME OF PATIENT: Jared Mullen  [id number]  [birth year]   DATE OF VISIT: 08/16/22  REASON FOR CONSULT: Jared Mullen is a 81 y.o. male with multiple medical problems including with multiple medical problems including with multiple medical problems including history of prostate cancer, sleep apnea, admitted to the hospital 11/10/2021 for evaluation of progressive weakness and weight loss.  CTA of the chest revealed a left upper lobe lung mass.  CT of the abdomen and pelvis revealed a lytic lesion to the right iliac crest.  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  He is on treatment with Tagrisso  INTERVAL HISTORY: Patient walks in to clinic today requesting to be seen.  He requested a dermatology referral for nonhealing scalp wound present over the past month.  He reports that he has previously seen dermatology and had similar lesions removed by the past.  Additionally, he wanted to ensure that the Tagrisso would not interact with an upcoming cataract surgery.  Patient denies any other changes or concerns.  Denies any neurologic complaints. Denies recent fevers or illnesses. Denies any easy bleeding or bruising. Reports good appetite and denies weight loss. Denies chest pain. Denies any nausea, vomiting, constipation, or diarrhea. Denies urinary complaints. Patient offers no further specific complaints today.   PAST MEDICAL HISTORY: Past Medical History:  Diagnosis Date   Abnormal ECG    a. baseline inferolateral ST elevation dating back to at least 2012.   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   Sleep apnea    Urinary frequency    Weak urinary stream    Weight loss     PAST SURGICAL HISTORY:  Past Surgical History:  Procedure Laterality Date   biospy  09/30/2011-   TRUS/Bx Prostate - 5/12/biopsies positive for cancer, glandular size 32.5 cc   CHOLECYSTECTOMY  1992   RADIOACTIVE SEED IMPLANT  02/09/2012   Procedure: RADIOACTIVE SEED IMPLANT;  Surgeon: Franchot Gallo, MD;  Location: [facility];  Service: Urology;  Laterality: N/A;  RAD TECH OK PER ANNE AT MAIN OR    UVULOPALATOPHARYNGOPLASTY     VIDEO BRONCHOSCOPY WITH ENDOBRONCHIAL ULTRASOUND Bilateral 11/12/2021   Procedure: VIDEO BRONCHOSCOPY WITH ENDOBRONCHIAL ULTRASOUND;  Surgeon: Ottie Glazier, MD;  Location: [facility];  Service: Thoracic;  Laterality: Bilateral;    HEMATOLOGY/ONCOLOGY HISTORY:  Oncology History  Primary cancer of left upper lobe of lung ([place])  11/24/2021 Initial Diagnosis   Primary cancer of left upper lobe of lung ([place])   11/24/2021 Cancer Staging   Staging form: Lung, AJCC 8th Edition - Clinical stage from 11/24/2021: Stage IV (cT3, cN2, cM1) - Signed by Sindy Guadeloupe, MD on 11/24/2021 Histopathologic type: Adenocarcinoma, NOS     ALLERGIES:  has No Known Allergies.  MEDICATIONS:  Current Outpatient Medications  Medication Sig Dispense Refill   acetaminophen (TYLENOL) 325 MG tablet Take 650 mg by mouth every 6 (six) hours as needed. (Patient not taking: Reported on 06/23/2022)     ondansetron (ZOFRAN) 4  MG tablet Take 1 tablet (4 mg total) by mouth every 8 (eight) hours as needed for nausea or vomiting. (Patient not taking: Reported on 02/10/2022) 45 tablet 0   osimertinib mesylate (TAGRISSO) 80 MG tablet TAKE ONE TABLET BY MOUTH ONCE DAILY AT THE SAME TIME. MAY TAKE WITH OR WITHOUT FOOD. 30 tablet 3   prochlorperazine (COMPAZINE) 10 MG tablet Take 1  tablet (10 mg total) by mouth every 6 (six) hours as needed for nausea or vomiting. (Patient not taking: Reported on 02/10/2022) 45 tablet 0   No current facility-administered medications for this visit.    VITAL SIGNS: There were no vitals taken for this visit. There were no vitals filed for this visit.  Estimated body mass index is 22.04 kg/m as calculated from the following:   Height as of 07/08/22: 5' 9.5" (1.765 m).   Weight as of 07/08/22: 151 lb 6.4 oz (68.7 kg).  LABS: CBC:    Component Value Date/Time   WBC 3.8 (L) 06/23/2022 0930   HGB 12.8 (L) 06/23/2022 0930   HGB 15.1 11/02/2021 0954   HCT 38.7 (L) 06/23/2022 0930   HCT 44.7 11/02/2021 0954   PLT 109 (L) 06/23/2022 0930   PLT 222 11/02/2021 0954   MCV 92.4 06/23/2022 0930   MCV 90 11/02/2021 0954   NEUTROABS 2.6 06/23/2022 0930   LYMPHSABS 0.4 (L) 06/23/2022 0930   MONOABS 0.6 06/23/2022 0930   EOSABS 0.2 06/23/2022 0930   BASOSABS 0.0 06/23/2022 0930   Comprehensive Metabolic Panel:    Component Value Date/Time   NA 137 06/23/2022 0930   NA 141 11/02/2021 0954   K 4.1 06/23/2022 0930   CL 102 06/23/2022 0930   CO2 30 06/23/2022 0930   BUN 23 06/23/2022 0930   BUN 17 11/02/2021 0954   CREATININE 0.97 06/23/2022 0930   GLUCOSE 111 (H) 06/23/2022 0930   CALCIUM 9.6 06/23/2022 0930   AST 22 06/23/2022 0930   ALT 16 06/23/2022 0930   ALKPHOS 49 06/23/2022 0930   BILITOT 1.3 (H) 06/23/2022 0930   BILITOT 0.6 11/02/2021 0954   PROT 7.2 06/23/2022 0930   PROT 7.0 11/02/2021 0954   ALBUMIN 3.9 06/23/2022 0930   ALBUMIN 3.8 11/02/2021 0954    RADIOGRAPHIC STUDIES: MR Brain W Wo Contrast  Result Date: 07/29/2022 CLINICAL DATA:  Brain metastases suspected. Vision changes. History of lung cancer. EXAM: MRI HEAD WITHOUT AND WITH CONTRAST TECHNIQUE: Multiplanar, multiecho pulse sequences of the brain and surrounding structures were obtained without and with intravenous contrast. CONTRAST:  37mL GADAVIST  GADOBUTROL 1 MMOL/ML IV SOLN COMPARISON:  Head MRI 11/10/2021 FINDINGS: Brain: There is no evidence of an acute infarct, mass, midline shift, or extra-axial fluid collection. A single focus of chronic microhemorrhage in the high posterior right frontal lobe was not evident on the prior MRI. Minimal periventricular white matter T2 hyperintensity is within normal limits. There is mild generalized cerebral atrophy. No abnormal enhancement is identified. Vascular: Major intracranial vascular flow voids are preserved. Skull and upper cervical spine: Unremarkable bone marrow signal para Sinuses/Orbits: Unremarkable orbits. Minimal mucosal thickening in the paranasal sinuses. Clear mastoid air cells. Other: None. IMPRESSION: No evidence of intracranial metastases or acute abnormality. Electronically Signed   By: Logan Bores M.D.   On: 07/29/2022 17:47    PERFORMANCE STATUS (ECOG) : 1 - Symptomatic but completely ambulatory  Review of Systems Unless otherwise noted, a complete review of systems is negative.  Physical Exam General: NAD Cardiovascular: regular rate and rhythm  Pulmonary: clear ant fields Abdomen: soft, nontender, + bowel sounds GU: no suprapubic tenderness Extremities: no edema, no joint deformities Skin: Small open lesion on forehead Neurological: Weakness but otherwise nonfocal     IMPRESSION/PLAN: Skin lesion -referral to dermatology.  Patient has previously been seen by Dr. Nehemiah Massed.  Lung cancer -on Tagrisso.  Will speak with Dr. Janese Banks regarding patient's concerns about possible interaction with cataract surgery.  Patient has preop surgical appointment tomorrow but does not yet have surgery scheduled.  Preop surgical clinic phone number is [phone] extension [phone].   Patient expressed understanding and was in agreement with this plan. He also understands that He can call clinic at any time with any questions, concerns, or complaints.   Thank you for allowing me to  participate in the care of this very pleasant patient.   Time Total: 15 minutes  Visit consisted of counseling and education dealing with the complex and emotionally intense issues of symptom management in the setting of serious illness.Greater than 50%  of this time was spent counseling and coordinating care related to the above assessment and plan.  Signed by: Altha Harm, PhD, NP-C

## 2022-08-19 ENCOUNTER — Telehealth: Payer: Self-pay | Admitting: Oncology

## 2022-08-19 NOTE — Telephone Encounter (Signed)
Answering service sent a message from Dr Otilio Saber [facility]) stating she wants to talk to Dr Janese Banks about this patient. Call her cell phone anytime [phone] or call office [phone].

## 2022-08-24 ENCOUNTER — Ambulatory Visit: Payer: Medicare Other | Admitting: Oncology

## 2022-08-24 ENCOUNTER — Other Ambulatory Visit: Payer: Medicare Other

## 2022-08-24 ENCOUNTER — Inpatient Hospital Stay: Payer: Medicare Other | Attending: Hospice and Palliative Medicine

## 2022-08-24 ENCOUNTER — Ambulatory Visit: Payer: Medicare Other

## 2022-08-24 DIAGNOSIS — C3412 Malignant neoplasm of upper lobe, left bronchus or lung: Secondary | ICD-10-CM | POA: Diagnosis not present

## 2022-08-24 DIAGNOSIS — C3491 Malignant neoplasm of unspecified part of right bronchus or lung: Secondary | ICD-10-CM

## 2022-08-24 LAB — CBC WITH DIFFERENTIAL/PLATELET
Abs Immature Granulocytes: 0.02 10*3/uL (ref 0.00–0.07)
Basophils Absolute: 0 10*3/uL (ref 0.0–0.1)
Basophils Relative: 1 %
Eosinophils Absolute: 0.2 10*3/uL (ref 0.0–0.5)
Eosinophils Relative: 5 %
HCT: 38.4 % — ABNORMAL LOW (ref 39.0–52.0)
Hemoglobin: 12.6 g/dL — ABNORMAL LOW (ref 13.0–17.0)
Immature Granulocytes: 0 %
Lymphocytes Relative: 11 %
Lymphs Abs: 0.5 10*3/uL — ABNORMAL LOW (ref 0.7–4.0)
MCH: 31 pg (ref 26.0–34.0)
MCHC: 32.8 g/dL (ref 30.0–36.0)
MCV: 94.3 fL (ref 80.0–100.0)
Monocytes Absolute: 0.6 10*3/uL (ref 0.1–1.0)
Monocytes Relative: 13 %
Neutro Abs: 3.2 10*3/uL (ref 1.7–7.7)
Neutrophils Relative %: 70 %
Platelets: 155 10*3/uL (ref 150–400)
RBC: 4.07 MIL/uL — ABNORMAL LOW (ref 4.22–5.81)
RDW: 14.1 % (ref 11.5–15.5)
WBC: 4.7 10*3/uL (ref 4.0–10.5)
nRBC: 0 % (ref 0.0–0.2)

## 2022-08-24 LAB — COMPREHENSIVE METABOLIC PANEL
ALT: 20 U/L (ref 0–44)
AST: 25 U/L (ref 15–41)
Albumin: 4 g/dL (ref 3.5–5.0)
Alkaline Phosphatase: 54 U/L (ref 38–126)
Anion gap: 11 (ref 5–15)
BUN: 28 mg/dL — ABNORMAL HIGH (ref 8–23)
CO2: 27 mmol/L (ref 22–32)
Calcium: 9.8 mg/dL (ref 8.9–10.3)
Chloride: 100 mmol/L (ref 98–111)
Creatinine, Ser: 0.84 mg/dL (ref 0.61–1.24)
GFR, Estimated: >60 mL/min (ref >60)
Glucose, Bld: 105 mg/dL — ABNORMAL HIGH (ref 70–99)
Potassium: 4.3 mmol/L (ref 3.5–5.1)
Sodium: 138 mmol/L (ref 135–145)
Total Bilirubin: 0.9 mg/dL (ref 0.3–1.2)
Total Protein: 7.4 g/dL (ref 6.5–8.1)

## 2022-09-01 ENCOUNTER — Encounter: Payer: Self-pay | Admitting: Hospice and Palliative Medicine

## 2022-09-02 ENCOUNTER — Encounter: Payer: Self-pay | Admitting: Hospice and Palliative Medicine

## 2022-09-02 ENCOUNTER — Inpatient Hospital Stay (HOSPITAL_BASED_OUTPATIENT_CLINIC_OR_DEPARTMENT_OTHER): Payer: Medicare Other | Admitting: Hospice and Palliative Medicine

## 2022-09-02 ENCOUNTER — Telehealth: Payer: Self-pay | Admitting: *Deleted

## 2022-09-02 ENCOUNTER — Other Ambulatory Visit: Payer: Self-pay

## 2022-09-02 VITALS — BP 124/76 | HR 78 | Temp 96.9°F | Resp 18 | Ht 69.5 in | Wt 153.0 lb

## 2022-09-02 DIAGNOSIS — C3412 Malignant neoplasm of upper lobe, left bronchus or lung: Secondary | ICD-10-CM

## 2022-09-02 DIAGNOSIS — R21 Rash and other nonspecific skin eruption: Secondary | ICD-10-CM

## 2022-09-02 MED ORDER — TRIAMCINOLONE ACETONIDE 0.5 % EX OINT: 1.0000 | TOPICAL_OINTMENT | Freq: Two times a day (BID) | CUTANEOUS | 1 refills | Status: DC

## 2022-09-02 NOTE — Progress Notes (Addendum)
Symptom Management [place] at [facility] Telephone:(336) [phone] Fax:(336) [phone]  Patient Care Team: Birdie Sons, MD as PCP - General (Family Medicine) Rockey Situ Kathlene November, MD as PCP - Cardiology (Cardiology) Franchot Gallo, MD as Consulting Physician (Urology) Telford Nab, RN as Oncology Nurse Navigator   NAME OF PATIENT: Jared Mullen  [id number]  1940-12-10   DATE OF VISIT: 09/02/22  REASON FOR CONSULT: LOMAN LOGAN is a 81 y.o. male with multiple medical problems including with multiple medical problems including with multiple medical problems including history of prostate cancer, sleep apnea, admitted to the hospital 11/10/2021 for evaluation of progressive weakness and weight loss.  CTA of the chest revealed a left upper lobe lung mass.  CT of the abdomen and pelvis revealed a lytic lesion to the right iliac crest.  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  He is on treatment with Tagrisso  INTERVAL HISTORY: Patient presents to clinic today for evaluation of a dry, pruritic rash on both arms starting since he began [place].  Patient denies hives or swelling.  No lesions, ulcers, or drainage.  He has tried to apply lotions but that has not helped.  Denies any neurologic complaints. Denies recent fevers or illnesses. Denies any easy bleeding or bruising. Reports good appetite and denies weight loss. Denies chest pain. Denies any nausea, vomiting, constipation, or diarrhea. Denies urinary complaints. Patient offers no further specific complaints today.   PAST MEDICAL HISTORY: Past Medical History:  Diagnosis Date   Abnormal ECG    a. baseline inferolateral ST elevation dating back to at least 2012.   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   Sleep apnea    Urinary  frequency    Weak urinary stream    Weight loss     PAST SURGICAL HISTORY:  Past Surgical History:  Procedure Laterality Date   biospy  09/30/2011-   TRUS/Bx Prostate - 5/12/biopsies positive for cancer, glandular size 32.5 cc   CHOLECYSTECTOMY  1992   RADIOACTIVE SEED IMPLANT  02/09/2012   Procedure: RADIOACTIVE SEED IMPLANT;  Surgeon: Franchot Gallo, MD;  Location: [facility];  Service: Urology;  Laterality: N/A;  RAD TECH OK PER ANNE AT MAIN OR    UVULOPALATOPHARYNGOPLASTY     VIDEO BRONCHOSCOPY WITH ENDOBRONCHIAL ULTRASOUND Bilateral 11/12/2021   Procedure: VIDEO BRONCHOSCOPY WITH ENDOBRONCHIAL ULTRASOUND;  Surgeon: Ottie Glazier, MD;  Location: [facility];  Service: Thoracic;  Laterality: Bilateral;    HEMATOLOGY/ONCOLOGY HISTORY:  Oncology History  Primary cancer of left upper lobe of lung ([place])  11/24/2021 Initial Diagnosis   Primary cancer of left upper lobe of lung ([place])   11/24/2021 Cancer Staging   Staging form: Lung, AJCC 8th Edition - Clinical stage from 11/24/2021: Stage IV (cT3, cN2, cM1) - Signed by Sindy Guadeloupe, MD on 11/24/2021 Histopathologic type: Adenocarcinoma, NOS     ALLERGIES:  has No Known Allergies.  MEDICATIONS:  Current Outpatient Medications  Medication Sig Dispense Refill   osimertinib mesylate (TAGRISSO) 80 MG tablet TAKE ONE TABLET BY MOUTH ONCE DAILY AT THE SAME TIME. MAY TAKE WITH OR WITHOUT FOOD. 30 tablet 3   triamcinolone ointment (KENALOG) 0.5 % Apply 1 Application topically 2 (two) times daily. 30 g 1   acetaminophen (TYLENOL) 325 MG tablet Take  650 mg by mouth every 6 (six) hours as needed. (Patient not taking: Reported on 06/23/2022)     No current facility-administered medications for this visit.    VITAL SIGNS: BP 124/76   Pulse 78   Temp (!) 96.9 F (36.1 C) (Tympanic)   Resp 18   Ht 5' 9.5" (1.765 m)   Wt 153 lb (69.4 kg)   BMI 22.27 kg/m  Filed Weights   09/02/22 1046  Weight: 153 lb (69.4 kg)     Estimated body mass index is 22.27 kg/m as calculated from the following:   Height as of this encounter: 5' 9.5" (1.765 m).   Weight as of this encounter: 153 lb (69.4 kg).  LABS: CBC:    Component Value Date/Time   WBC 4.7 08/24/2022 1348   HGB 12.6 (L) 08/24/2022 1348   HGB 15.1 11/02/2021 0954   HCT 38.4 (L) 08/24/2022 1348   HCT 44.7 11/02/2021 0954   PLT 155 08/24/2022 1348   PLT 222 11/02/2021 0954   MCV 94.3 08/24/2022 1348   MCV 90 11/02/2021 0954   NEUTROABS 3.2 08/24/2022 1348   LYMPHSABS 0.5 (L) 08/24/2022 1348   MONOABS 0.6 08/24/2022 1348   EOSABS 0.2 08/24/2022 1348   BASOSABS 0.0 08/24/2022 1348   Comprehensive Metabolic Panel:    Component Value Date/Time   NA 138 08/24/2022 1348   NA 141 11/02/2021 0954   K 4.3 08/24/2022 1348   CL 100 08/24/2022 1348   CO2 27 08/24/2022 1348   BUN 28 (H) 08/24/2022 1348   BUN 17 11/02/2021 0954   CREATININE 0.84 08/24/2022 1348   GLUCOSE 105 (H) 08/24/2022 1348   CALCIUM 9.8 08/24/2022 1348   AST 25 08/24/2022 1348   ALT 20 08/24/2022 1348   ALKPHOS 54 08/24/2022 1348   BILITOT 0.9 08/24/2022 1348   BILITOT 0.6 11/02/2021 0954   PROT 7.4 08/24/2022 1348   PROT 7.0 11/02/2021 0954   ALBUMIN 4.0 08/24/2022 1348   ALBUMIN 3.8 11/02/2021 0954    RADIOGRAPHIC STUDIES: No results found.  PERFORMANCE STATUS (ECOG) : 1 - Symptomatic but completely ambulatory  Review of Systems Unless otherwise noted, a complete review of systems is negative.  Physical Exam General: NAD Cardiovascular: regular rate and rhythm Pulmonary: clear ant fields Abdomen: soft, nontender, + bowel sounds GU: no suprapubic tenderness Extremities: no edema, no joint deformities Skin: Dry skin with slight erythema both arms Neurological: Weakness but otherwise nonfocal          IMPRESSION/PLAN: Rash-patient pending evaluation by dermatology in January. Skin appears to be severely dry. Recommended emollient cream after he  showers. Tagrisso could be a potential cause.  Will also start Kenalog cream.  Lung cancer -on Tagrisso.     Patient expressed understanding and was in agreement with this plan. He also understands that He can call clinic at any time with any questions, concerns, or complaints.   Thank you for allowing me to participate in the care of this very pleasant patient.   Time Total: 15 minutes  Visit consisted of counseling and education dealing with the complex and emotionally intense issues of symptom management in the setting of serious illness.Greater than 50%  of this time was spent counseling and coordinating care related to the above assessment and plan.  Signed by: Altha Harm, PhD, NP-C

## 2022-09-02 NOTE — Progress Notes (Signed)
Patient c/o burning rash on arms. He also c/o of left ear pain. He stated he attempted to use a 'bobby-pin' to remove wax from his left ear. RN examined his ears bilaterally. Mild wax present in his Right ear; R ear drum/tympanic membrane is intact/clear/patent. Regarding the left ear- there is red scratches on the external auditory ear canal. L ear drum/tympanic membrane is intact/clear/patent. No signs of infection.

## 2022-09-02 NOTE — Telephone Encounter (Signed)
Daughter sent mychart msg to report that has a burning rash. Would like to be evaluated. Apt made for [facility] clinic today at 1115. Contacted daughter- apts provided.

## 2022-09-15 ENCOUNTER — Inpatient Hospital Stay (HOSPITAL_BASED_OUTPATIENT_CLINIC_OR_DEPARTMENT_OTHER): Payer: Medicare Other | Admitting: Hospice and Palliative Medicine

## 2022-09-15 DIAGNOSIS — C3412 Malignant neoplasm of upper lobe, left bronchus or lung: Secondary | ICD-10-CM | POA: Diagnosis not present

## 2022-09-15 DIAGNOSIS — Z515 Encounter for palliative care: Secondary | ICD-10-CM | POA: Diagnosis not present

## 2022-09-15 NOTE — Progress Notes (Signed)
Virtual Visit via Telephone Note  I connected with [name] on 09/15/22 at 11:30 AM EST by telephone and verified that I am speaking with the correct person using two identifiers.  Location: Patient: Home Provider: Clinic   I discussed the limitations, risks, security and privacy concerns of performing an evaluation and management service by telephone and the availability of in person appointments. I also discussed with the patient that there may be a patient responsible charge related to this service. The patient expressed understanding and agreed to proceed.   History of Present Illness: Jared Mullen is a 81 y.o. male with multiple medical problems including with multiple medical problems including with multiple medical problems including history of prostate cancer, sleep apnea, admitted to the hospital 11/10/2021 for evaluation of progressive weakness and weight loss.  CTA of the chest revealed a left upper lobe lung mass.  CT of the abdomen and pelvis revealed a lytic lesion to the right iliac crest.  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  Palliative care was consulted to help address goals.   Observations/Objective: Spoke with patient by phone.  He reports that he is doing well.  He denies any acute changes or concerns.  Patient says that his arm rash has resolved.  He also feels like his lesion on his forehead is improving.  He is pending dermatology evaluation for scalp lesion.  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  Rash -resolved  Follow Up Instructions: Telephone visit 1 to 2 months   I discussed the assessment and treatment plan with the patient. The patient was provided an opportunity to ask questions and all were answered. The patient agreed with the plan and demonstrated an understanding of the instructions.   The patient was advised to  call back or seek an in-person evaluation if the symptoms worsen or if the condition fails to improve as anticipated.  I provided 5 minutes of non-face-to-face time during this encounter.   [name], NP

## 2022-09-16 ENCOUNTER — Telehealth: Payer: Medicare Other | Admitting: Hospice and Palliative Medicine

## 2022-10-20 ENCOUNTER — Ambulatory Visit
Admission: RE | Admit: 2022-10-20 | Discharge: 2022-10-20 | Disposition: A | Discharge status: Home / Self Care | Payer: Medicare Other | Source: Ambulatory Visit | Attending: Oncology | Admitting: Oncology

## 2022-10-20 ENCOUNTER — Ambulatory Visit
Admission: RE | Admit: 2022-10-20 | Discharge: 2022-10-20 | Disposition: A | Discharge status: Home / Self Care | Payer: Medicare Other | Attending: Oncology | Admitting: Oncology

## 2022-10-20 ENCOUNTER — Telehealth: Payer: Self-pay | Admitting: *Deleted

## 2022-10-20 DIAGNOSIS — R0781 Pleurodynia: Secondary | ICD-10-CM | POA: Diagnosis present

## 2022-10-20 NOTE — Telephone Encounter (Signed)
Ok to get xray first and then see smc this week

## 2022-10-20 NOTE — Telephone Encounter (Signed)
Chest Xray 2 view ordered per v/o Dr. Janese Banks. Patient added to Lauren's schedule for smc on Friday at 11 am.

## 2022-10-20 NOTE — Telephone Encounter (Signed)
Patient daughter called reporting that patient is having right rib pain and is not sure if he may have a fractured rib. Asking if he can be seen or at least have an order for an xray. Please advise

## 2022-10-22 ENCOUNTER — Inpatient Hospital Stay: Payer: Medicare Other | Attending: Hospice and Palliative Medicine | Admitting: Medical Oncology

## 2022-10-22 ENCOUNTER — Inpatient Hospital Stay: Payer: Medicare Other | Admitting: Nurse Practitioner

## 2022-10-22 VITALS — BP 127/73 | HR 73 | Temp 97.3°F | Wt 154.6 lb

## 2022-10-22 DIAGNOSIS — Z79899 Other long term (current) drug therapy: Secondary | ICD-10-CM | POA: Diagnosis not present

## 2022-10-22 DIAGNOSIS — G4730 Sleep apnea, unspecified: Secondary | ICD-10-CM | POA: Insufficient documentation

## 2022-10-22 DIAGNOSIS — R0781 Pleurodynia: Secondary | ICD-10-CM | POA: Insufficient documentation

## 2022-10-22 DIAGNOSIS — Z923 Personal history of irradiation: Secondary | ICD-10-CM | POA: Insufficient documentation

## 2022-10-22 DIAGNOSIS — C3412 Malignant neoplasm of upper lobe, left bronchus or lung: Secondary | ICD-10-CM | POA: Diagnosis not present

## 2022-10-22 NOTE — Progress Notes (Signed)
Symptom Management [place] at [facility] [phone] [phone]  Patient Care Team: Birdie Sons, MD as PCP - General (Family Medicine) Rockey Situ Kathlene November, MD as PCP - Cardiology (Cardiology) Franchot Gallo, MD as Consulting Physician (Urology) Telford Nab, RN as Oncology Nurse Navigator   NAME OF PATIENT: Jared Mullen  [id number]  [birth year]   DATE OF VISIT: 10/22/22  REASON FOR CONSULT: Jared Mullen is a 82 y.o. male with multiple medical problems including history of prostate cancer, sleep apnea, admitted to the hospital 11/10/2021 for evaluation of progressive weakness and weight loss.  CTA of the chest revealed a left upper lobe lung mass.  CT of the abdomen and pelvis revealed a lytic lesion to the right iliac crest.  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  He is on treatment with Tagrisso  INTERVAL HISTORY: Patient was assessed on 10/20/2022 for right rib pain. Our office submitted an order for an x ray of the chest which was completed on 10/20/2022. X ray imaging showed:   "IMPRESSION: Left upper lobe lung mass is smaller compared to prior exam. 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 He was in an uncomfortable position with his right arm above his head for about 25 minutes. Roughly since this time he has had the rib pain. He wanted to ensure that he did not have a "cracked rib" from this. He has tried tylenol (2 tablets total) for symptoms with some relief. He declines pain medication today.  Denies any neurologic complaints. Denies recent fevers or illnesses. Denies any easy bleeding or bruising. Reports good appetite and denies weight loss. Denies chest pain, SOB, hemoptysis, rash. Denies any nausea, vomiting, constipation, or diarrhea. Denies urinary  complaints. Patient offers no further specific complaints today.   PAST MEDICAL HISTORY: Past Medical History:  Diagnosis Date   Abnormal ECG    a. baseline inferolateral ST elevation dating back to at least 2012.   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   Sleep apnea    Urinary frequency    Weak urinary stream    Weight loss     PAST SURGICAL HISTORY:  Past Surgical History:  Procedure Laterality Date   biospy  09/30/2011-   TRUS/Bx Prostate - 5/12/biopsies positive for cancer, glandular size 32.5 cc   CHOLECYSTECTOMY  1992   RADIOACTIVE SEED IMPLANT  02/09/2012   Procedure: RADIOACTIVE SEED IMPLANT;  Surgeon: Franchot Gallo, MD;  Location: [facility];  Service: Urology;  Laterality: N/A;  RAD TECH OK PER ANNE AT MAIN OR    UVULOPALATOPHARYNGOPLASTY     VIDEO BRONCHOSCOPY WITH ENDOBRONCHIAL ULTRASOUND Bilateral 11/12/2021   Procedure: VIDEO BRONCHOSCOPY WITH ENDOBRONCHIAL ULTRASOUND;  Surgeon: Ottie Glazier, MD;  Location: [facility];  Service: Thoracic;  Laterality: Bilateral;    HEMATOLOGY/ONCOLOGY HISTORY:  Oncology History  Primary cancer of left upper lobe of lung ([place])  11/24/2021 Initial Diagnosis   Primary cancer of left upper lobe of lung ([place])   11/24/2021 Cancer Staging   Staging form: Lung, AJCC 8th Edition - Clinical stage from 11/24/2021: Stage IV (cT3, cN2, cM1) - Signed by Sindy Guadeloupe, MD on 11/24/2021 Histopathologic type:  Adenocarcinoma, NOS     ALLERGIES:  has No Known Allergies.  MEDICATIONS:  Current Outpatient Medications  Medication Sig Dispense Refill   acetaminophen (TYLENOL) 325 MG tablet Take 650 mg by mouth every 6 (six) hours as needed.     osimertinib mesylate (TAGRISSO) 80 MG tablet TAKE ONE TABLET BY MOUTH ONCE DAILY AT THE SAME TIME. MAY TAKE WITH OR WITHOUT FOOD. 30 tablet 3    triamcinolone ointment (KENALOG) 0.5 % Apply 1 Application topically 2 (two) times daily. 30 g 1   No current facility-administered medications for this visit.    VITAL SIGNS: BP 127/73 (BP Location: Right Arm, Patient Position: Sitting)   Pulse 73   Temp (!) 97.3 F (36.3 C) (Tympanic)   Wt 154 lb 9.6 oz (70.1 kg)   SpO2 100%   BMI 22.50 kg/m  Filed Weights   10/22/22 1108  Weight: 154 lb 9.6 oz (70.1 kg)    Estimated body mass index is 22.5 kg/m as calculated from the following:   Height as of 09/02/22: 5' 9.5" (1.765 m).   Weight as of this encounter: 154 lb 9.6 oz (70.1 kg).  LABS: CBC:    Component Value Date/Time   WBC 4.7 08/24/2022 1348   HGB 12.6 (L) 08/24/2022 1348   HGB 15.1 11/02/2021 0954   HCT 38.4 (L) 08/24/2022 1348   HCT 44.7 11/02/2021 0954   PLT 155 08/24/2022 1348   PLT 222 11/02/2021 0954   MCV 94.3 08/24/2022 1348   MCV 90 11/02/2021 0954   NEUTROABS 3.2 08/24/2022 1348   LYMPHSABS 0.5 (L) 08/24/2022 1348   MONOABS 0.6 08/24/2022 1348   EOSABS 0.2 08/24/2022 1348   BASOSABS 0.0 08/24/2022 1348   Comprehensive Metabolic Panel:    Component Value Date/Time   NA 138 08/24/2022 1348   NA 141 11/02/2021 0954   K 4.3 08/24/2022 1348   CL 100 08/24/2022 1348   CO2 27 08/24/2022 1348   BUN 28 (H) 08/24/2022 1348   BUN 17 11/02/2021 0954   CREATININE 0.84 08/24/2022 1348   GLUCOSE 105 (H) 08/24/2022 1348   CALCIUM 9.8 08/24/2022 1348   AST 25 08/24/2022 1348   ALT 20 08/24/2022 1348   ALKPHOS 54 08/24/2022 1348   BILITOT 0.9 08/24/2022 1348   BILITOT 0.6 11/02/2021 0954   PROT 7.4 08/24/2022 1348   PROT 7.0 11/02/2021 0954   ALBUMIN 4.0 08/24/2022 1348   ALBUMIN 3.8 11/02/2021 0954    RADIOGRAPHIC STUDIES: DG Chest 2 View  Result Date: 10/20/2022 CLINICAL DATA:  Right-sided chest pain. EXAM: CHEST - 2 VIEW COMPARISON:  November 12, 2021.  June 07, 2022. FINDINGS: The heart size and mediastinal contours are within normal limits. Left  upper lobe mass noted on prior exam is significantly smaller. Calcified pleural plaques are again noted bilaterally. The visualized skeletal structures are unremarkable. IMPRESSION: Left upper lobe lung mass is smaller compared to prior exam. 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 Electronically Signed   By: Marijo Conception M.D.   On: 10/20/2022 16:24    PERFORMANCE STATUS (ECOG) : 1 - Symptomatic but completely ambulatory  Review of Systems Unless otherwise noted, a complete review of systems is negative.  Physical Exam General: NAD Cardiovascular: regular rate and rhythm Pulmonary: clear ant fields Abdomen: soft, nontender, + bowel sounds GU: no suprapubic tenderness Extremities: no edema, no joint deformities Skin: No rash of skin  Neurological: Weakness but otherwise nonfocal   IMPRESSION/PLAN: Encounter Diagnoses  Name  Primary?   Rib pain on right side Yes   Primary cancer of left upper lobe of lung ([place])    Lung cancer -on Tagrisso.    Rib Pain: Wide differential. Suspect that he aggravated this area during his ECHO. Likely sprain of intercostal muscles. No rash or tenderness to palpation to suggest shingles. No mass palpable. Chest x ray does not show area of concern for bone involvement, pneumonia, etc. No SOB, hypoxia or chest pain to suggest PE. Offered lidoacine patches however he reports that he does not require any pain medication. Has CT scheduled for later this month. Will alert me to red flag signs and symptoms we discussed.    Patient expressed understanding and was in agreement with this plan. He also understands that He can call clinic at any time with any questions, concerns, or complaints.   Thank you for allowing me to participate in the care of this very pleasant patient.   Time Total: 15 minutes  Visit consisted of counseling and education dealing with the complex and emotionally intense issues of symptom management in the  setting of serious illness.Greater than 50%  of this time was spent counseling and coordinating care related to the above assessment and plan.  Signed by: Nelwyn Salisbury PA-C

## 2022-10-26 ENCOUNTER — Ambulatory Visit: Payer: Medicare Other | Admitting: Oncology

## 2022-10-26 ENCOUNTER — Other Ambulatory Visit: Payer: Medicare Other

## 2022-10-29 ENCOUNTER — Other Ambulatory Visit: Payer: Self-pay | Admitting: Oncology

## 2022-10-29 DIAGNOSIS — C3412 Malignant neoplasm of upper lobe, left bronchus or lung: Secondary | ICD-10-CM

## 2022-10-29 NOTE — Telephone Encounter (Signed)
CBC with Differential/Platelet Order: [id number] Status: Final result     Visible to patient: Yes (seen)     Next appt: 11/08/2022 at 10:00 AM in Radiology ([facility])     Dx: Primary malignant neoplasm of right l...   0 Result Notes           Component Ref Range & Units 2 mo ago 4 mo ago 6 mo ago 7 mo ago 8 mo ago 9 mo ago 11 mo ago  WBC 4.0 - 10.5 K/uL 4.7 3.8 Low  5.3 5.2 6.2 6.2 8.6  RBC 4.22 - 5.81 MIL/uL 4.07 Low  4.19 Low  4.28 4.07 Low  4.14 Low  4.32 4.46  Hemoglobin 13.0 - 17.0 g/dL 12.6 Low  12.8 Low  13.0 12.4 Low  12.1 Low  12.5 Low  13.2  HCT 39.0 - 52.0 % 38.4 Low  38.7 Low  39.8 37.8 Low  37.2 Low  38.2 Low  40.2  MCV 80.0 - 100.0 fL 94.3 92.4 93.0 92.9 89.9 88.4 90.1  MCH 26.0 - 34.0 pg 31.0 30.5 30.4 30.5 29.2 28.9 29.6  MCHC 30.0 - 36.0 g/dL 32.8 33.1 32.7 32.8 32.5 32.7 32.8  RDW 11.5 - 15.5 % 14.1 14.7 14.7 15.7 High  16.6 High  15.2 13.3  Platelets 150 - 400 K/uL 155 109 Low  123 Low  CM 146 Low  CM 164 146 Low  231  nRBC 0.0 - 0.2 % 0.0 0.0 0.0 0.0 0.0 0.0 0.0 CM  Neutrophils Relative % % 70 69 64 66 68 67   Neutro Abs 1.7 - 7.7 K/uL 3.2 2.6 3.4 3.5 4.3 4.1   Lymphocytes Relative % 11 10 13 14 13 14    Lymphs Abs 0.7 - 4.0 K/uL 0.5 Low  0.4 Low  0.7 0.8 0.8 0.9   Monocytes Relative % 13 15 16 13 14 11    Monocytes Absolute 0.1 - 1.0 K/uL 0.6 0.6 0.9 0.7 0.9 0.7   Eosinophils Relative % 5 5 5 6 4 7    Eosinophils Absolute 0.0 - 0.5 K/uL 0.2 0.2 0.3 0.3 0.3 0.5   Basophils Relative % 1 1 1 1 1 1    Basophils Absolute 0.0 - 0.1 K/uL 0.0 0.0 0.0 0.0 0.0 0.1   Immature Granulocytes % 0 0 1 0 0 0   Abs Immature Granulocytes 0.00 - 0.07 K/uL 0.02 0.01 CM 0.03 CM 0.01 CM 0.01 CM 0.01 CM   Comment: Performed at [facility], [address] [place], [place] [address]  Resulting Agency  [place] [facility] [place] [facility] [place] [facility] [place] [facility] [place] [facility] [place] [facility] [place] [facility]         Specimen Collected: 08/24/22 13:48 Last Resulted: 08/24/22 14:23      Lab Flowsheet       Order Details      View Encounter      Lab and Collection Details      Routing      Result History    View All Conversations on this Encounter      CM=Additional comments      Result Care Coordination   Patient Communication   Add Comments   Seen Back to Top      Other Results from 08/24/2022   Contains abnormal data Comprehensive metabolic panel Order: [id number] Status: Final result      Visible to patient: Yes (seen)      Next appt: 11/08/2022 at 10:00 AM in Radiology (  [facility])      Dx: Primary malignant neoplasm of right l...    0 Result Notes             Component Ref Range & Units 2 mo ago (08/24/22) 4 mo ago (06/23/22) 4 mo ago (06/07/22) 6 mo ago (04/19/22) 7 mo ago (03/19/22) 8 mo ago (02/10/22) 9 mo ago (01/13/22)  Sodium 135 - 145 mmol/L 138 137  138 138 132 Low  134 Low   Potassium 3.5 - 5.1 mmol/L 4.3 4.1  4.7 4.9 4.1 4.3  Chloride 98 - 111 mmol/L 100 102  102 103 97 Low  100  CO2 22 - 32 mmol/L 27 30  30 30 29 29   Glucose, Bld 70 - 99 mg/dL 105 High  111 High  CM  105 High  CM 108 High  CM 121 High  CM 111 High  CM  Comment: Glucose reference range applies only to samples taken after fasting for at least 8 hours.  BUN 8 - 23 mg/dL 28 High  23  16 15 15 12   Creatinine, Ser 0.61 - 1.24 mg/dL 0.84 0.97 1.00 0.78 0.78 0.68 0.71  Calcium 8.9 - 10.3 mg/dL 9.8 9.6  9.0 9.0 8.9 8.9  Total Protein 6.5 - 8.1 g/dL 7.4 7.2  6.8 7.1 7.7 7.0  Albumin 3.5 - 5.0 g/dL 4.0 3.9  3.6 3.5 3.4 Low  3.1 Low   AST 15 - 41 U/L 25 22  20 19 19 21   ALT 0 - 44 U/L 20 16  15 15 17 20   Alkaline Phosphatase 38 - 126 U/L 54 49  51 50 71 74  Total Bilirubin 0.3 - 1.2 mg/dL 0.9 1.3 High   1.2 0.6 0.8 0.7  GFR, Estimated >60 mL/min >60 >60 CM  >60 CM >60 CM >60 CM >60 CM  Comment: (NOTE) Calculated using the CKD-EPI Creatinine Equation (2021)  Anion gap 5 - 15 11 5  CM  6 CM 5 CM 6 CM 5 CM  Comment: Performed at [facility], [address] [place], [place] [address]   Resulting Agency  [facility] [place] [facility] [place] [facility] [place] [facility] [place] [facility] [place] [facility] [place] [facility]         Specimen Collected: 08/24/22 13:48 Last Resulted: 08/24/22 14:59

## 2022-11-02 ENCOUNTER — Telehealth: Payer: Self-pay

## 2022-11-02 NOTE — Telephone Encounter (Signed)
Copied from CRM [id number]. Topic: General - Inquiry >> Nov 02, 2022  2:09 PM Lyman Speller wrote: Reason for CRM: pt needs handicap placard form completed / please advise if Dr. Sherrie Mustache can complete for pick up

## 2022-11-03 ENCOUNTER — Encounter: Payer: Medicare Other | Admitting: Family Medicine

## 2022-11-08 ENCOUNTER — Other Ambulatory Visit: Payer: Medicare Other

## 2022-11-08 ENCOUNTER — Telehealth: Payer: Self-pay

## 2022-11-08 ENCOUNTER — Encounter: Payer: Self-pay | Admitting: Oncology

## 2022-11-08 ENCOUNTER — Ambulatory Visit
Admission: RE | Admit: 2022-11-08 | Discharge: 2022-11-08 | Disposition: A | Discharge status: Home / Self Care | Payer: Medicare Other | Source: Ambulatory Visit | Attending: Oncology | Admitting: Oncology

## 2022-11-08 DIAGNOSIS — C3412 Malignant neoplasm of upper lobe, left bronchus or lung: Secondary | ICD-10-CM | POA: Insufficient documentation

## 2022-11-08 LAB — POCT I-STAT CREATININE: Creatinine, Ser: 0.7 mg/dL (ref 0.61–1.24)

## 2022-11-08 MED ORDER — IOHEXOL 300 MG/ML  SOLN
100.0000 mL | Freq: Once | INTRAMUSCULAR | Status: AC | PRN
  Administered 2022-11-08: 100 mL via INTRAVENOUS

## 2022-11-08 NOTE — Telephone Encounter (Signed)
2nd attempt: Palliative care outreach to complete telephonic check in and schedule PC visit. Call unsuccessful. LVM.

## 2022-11-10 ENCOUNTER — Other Ambulatory Visit: Payer: Self-pay

## 2022-11-11 ENCOUNTER — Ambulatory Visit
Admission: RE | Admit: 2022-11-11 | Discharge: 2022-11-11 | Disposition: A | Discharge status: Home / Self Care | Payer: Medicare Other | Source: Ambulatory Visit | Attending: Radiation Oncology | Admitting: Radiation Oncology

## 2022-11-11 ENCOUNTER — Encounter: Payer: Self-pay | Admitting: Radiation Oncology

## 2022-11-11 VITALS — BP 119/74 | HR 75 | Temp 96.0°F | Resp 16 | Ht 68.0 in | Wt 155.9 lb

## 2022-11-11 DIAGNOSIS — C3412 Malignant neoplasm of upper lobe, left bronchus or lung: Secondary | ICD-10-CM | POA: Diagnosis not present

## 2022-11-11 DIAGNOSIS — Z923 Personal history of irradiation: Secondary | ICD-10-CM | POA: Diagnosis not present

## 2022-11-11 DIAGNOSIS — C7951 Secondary malignant neoplasm of bone: Secondary | ICD-10-CM | POA: Insufficient documentation

## 2022-11-11 NOTE — Progress Notes (Signed)
Radiation Oncology Follow up Note  Name: Jared Mullen   Date:   11/11/2022 MRN:  [id number] DOB: 1941-05-21    This 82 y.o. male presents to the clinic today for 73-month follow-up status post palliative radiation therapy to both his chest and hip for stage IV EGFR positive adenocarcinoma lung.Marland Kitchen  REFERRING PROVIDER: Birdie Sons, MD  HPI: Patient is a 82 year old male with right iliac bone for stage IV PgR FOR positive adenocarcinoma.  Seen today in routine follow-up overall he is stable.  He specifically denies any significant new pain he is having no pain in his l pelvis or right hip.  Specifically [place] cough hemoptysis or chest tightness..  Recent CT scan of the chest which I have reviewed shows left upper lobe central obstructive fibrosis August 20 mixed response lesion is stable.  He also had a chest x-ray having some right rib pain which is improved rib fracture.  He is currently under palliative care and is being treated with Tagrisso  COMPLICATIONS OF TREATMENT: none  FOLLOW UP COMPLIANCE: keeps appointments   PHYSICAL EXAM:  BP 119/74   Pulse 75   Temp (!) 96 F (35.6 C)   Resp 16   Ht 5\' 8"  (1.727 m)   Wt 155 lb 14.4 oz (70.7 kg)   BMI 23.70 kg/m  Slightly frail appearing elderly male in NAD range of motion is lower extremities elicit pain.  Well-developed well-nourished patient in NAD. HEENT reveals PERLA, EOMI, discs not visualized.  Oral cavity is clear. No oral mucosal lesions are identified. Neck is clear without evidence of cervical or supraclavicular adenopathy. Lungs are clear to A&P. Cardiac examination is essentially unremarkable with regular rate and rhythm without murmur rub or thrill. Abdomen is benign with no organomegaly or masses noted. Motor sensory and DTR levels are equal and symmetric in the upper and lower extremities. Cranial nerves II through XII are grossly intact. Proprioception is intact. No peripheral adenopathy or edema is identified. No motor or  sensory levels are noted. Crude visual fields are within normal range.  RADIOLOGY RESULTS: CT scan and plain films of chest reviewed compatible with above-stated findings  PLAN: Present time patient is stable currently under treatment with Tagrisso.  I am pleased with his overall progress.  I will turn follow-up care over to palliative team.  Be happy to reevaluate the patient for any further palliative treatment as necessary.  Patient and family know to call with any concerns.  I would like to take this opportunity to thank you for allowing me to participate in the care of your patient.Noreene Filbert, MD

## 2022-11-12 ENCOUNTER — Inpatient Hospital Stay: Payer: Medicare Other

## 2022-11-12 ENCOUNTER — Encounter: Payer: Self-pay | Admitting: Oncology

## 2022-11-12 ENCOUNTER — Inpatient Hospital Stay (HOSPITAL_BASED_OUTPATIENT_CLINIC_OR_DEPARTMENT_OTHER): Payer: Medicare Other | Admitting: Oncology

## 2022-11-12 VITALS — BP 119/70 | HR 75 | Temp 96.8°F | Resp 16 | Wt 151.5 lb

## 2022-11-12 DIAGNOSIS — Z79899 Other long term (current) drug therapy: Secondary | ICD-10-CM | POA: Diagnosis not present

## 2022-11-12 DIAGNOSIS — C3412 Malignant neoplasm of upper lobe, left bronchus or lung: Secondary | ICD-10-CM | POA: Diagnosis not present

## 2022-11-12 DIAGNOSIS — Z7189 Other specified counseling: Secondary | ICD-10-CM

## 2022-11-12 LAB — CBC WITH DIFFERENTIAL/PLATELET
Abs Immature Granulocytes: 0.01 10*3/uL (ref 0.00–0.07)
Basophils Absolute: 0 10*3/uL (ref 0.0–0.1)
Basophils Relative: 1 %
Eosinophils Absolute: 0.2 10*3/uL (ref 0.0–0.5)
Eosinophils Relative: 4 %
HCT: 39.5 % (ref 39.0–52.0)
Hemoglobin: 13 g/dL (ref 13.0–17.0)
Immature Granulocytes: 0 %
Lymphocytes Relative: 13 %
Lymphs Abs: 0.5 10*3/uL — ABNORMAL LOW (ref 0.7–4.0)
MCH: 31.1 pg (ref 26.0–34.0)
MCHC: 32.9 g/dL (ref 30.0–36.0)
MCV: 94.5 fL (ref 80.0–100.0)
Monocytes Absolute: 0.5 10*3/uL (ref 0.1–1.0)
Monocytes Relative: 13 %
Neutro Abs: 2.6 10*3/uL (ref 1.7–7.7)
Neutrophils Relative %: 69 %
Platelets: 133 10*3/uL — ABNORMAL LOW (ref 150–400)
RBC: 4.18 MIL/uL — ABNORMAL LOW (ref 4.22–5.81)
RDW: 13.8 % (ref 11.5–15.5)
WBC: 3.9 10*3/uL — ABNORMAL LOW (ref 4.0–10.5)
nRBC: 0 % (ref 0.0–0.2)

## 2022-11-12 LAB — IRON AND TIBC
Iron: 97 ug/dL (ref 45–182)
Saturation Ratios: 40 % — ABNORMAL HIGH (ref 17.9–39.5)
TIBC: 245 ug/dL — ABNORMAL LOW (ref 250–450)
UIBC: 148 ug/dL

## 2022-11-12 LAB — COMPREHENSIVE METABOLIC PANEL
ALT: 20 U/L (ref 0–44)
AST: 24 U/L (ref 15–41)
Albumin: 3.8 g/dL (ref 3.5–5.0)
Alkaline Phosphatase: 61 U/L (ref 38–126)
Anion gap: 10 (ref 5–15)
BUN: 19 mg/dL (ref 8–23)
CO2: 28 mmol/L (ref 22–32)
Calcium: 9.3 mg/dL (ref 8.9–10.3)
Chloride: 98 mmol/L (ref 98–111)
Creatinine, Ser: 0.78 mg/dL (ref 0.61–1.24)
GFR, Estimated: >60 mL/min (ref >60)
Glucose, Bld: 107 mg/dL — ABNORMAL HIGH (ref 70–99)
Potassium: 4.2 mmol/L (ref 3.5–5.1)
Sodium: 136 mmol/L (ref 135–145)
Total Bilirubin: 1.5 mg/dL — ABNORMAL HIGH (ref 0.3–1.2)
Total Protein: 7.3 g/dL (ref 6.5–8.1)

## 2022-11-12 LAB — FERRITIN: Ferritin: 260 ng/mL (ref 24–336)

## 2022-11-12 NOTE — Progress Notes (Signed)
Hematology/Oncology Consult note [facility]  [phone] [phone]  Patient Care Team: Birdie Sons, MD as PCP - General (Family Medicine) Rockey Situ Kathlene November, MD as PCP - Cardiology (Cardiology) Franchot Gallo, MD as Consulting Physician (Urology) Telford Nab, RN as Oncology Nurse Navigator   Name of the patient: Jared Mullen  [id number]  [birth year]   Date of visit: 11/12/22  Diagnosis-metastatic EGFR positive lung cancer  Chief complaint/ Reason for visit-routine follow-up of lung cancer on Tagrisso  Heme/Onc history: Patient is a 82 year old male who was admitted to the hospital in January 2023 with symptoms of generalized weakness cough and ongoing weight loss.  CT chest showed left perihilar upper lobe masslike opacity measuring 6.6 x 5 x 6.1 cm contiguous with the hilum.  Left hilar adenopathy as well as paratracheal adenopathy.  Severe narrowing of the left upper lobe bronchus due to left perihilar mass.  CT abdomen and pelvis with contrast showed large lytic lesion involving the right iliac bone.  MRI brain with and without contrast did not show any evidence of metastatic disease.  Patient underwent bronchoscopy with biopsy of the left hilar mass which was consistent with adenocarcinoma.   NGS testing showed EGFR L858R mutation that could be targetable with Tagrisso.  Tumor mutational burden high.  PD-L1 less than 1%.  No other actionable mutations.  Patient has been on Tagrisso since January 2023  Interval history-patient is doing well overall.  Tolerating Tagrisso well without any significant side effects.  Denies any hip pain.  He does have a skin lesion for which he will be seeing Dr. Nehemiah Massed from dermatology but is wondering if he should wait until he can follow-up at the [facility].  ECOG PS- 1 Pain scale- 0   Review of systems- Review of Systems  Constitutional:  Positive for malaise/fatigue. Negative for chills, fever and  weight loss.  HENT:  Negative for congestion, ear discharge and nosebleeds.   Eyes:  Negative for blurred vision.  Respiratory:  Negative for cough, hemoptysis, sputum production, shortness of breath and wheezing.   Cardiovascular:  Negative for chest pain, palpitations, orthopnea and claudication.  Gastrointestinal:  Negative for abdominal pain, blood in stool, constipation, diarrhea, heartburn, melena, nausea and vomiting.  Genitourinary:  Negative for dysuria, flank pain, frequency, hematuria and urgency.  Musculoskeletal:  Negative for back pain, joint pain and myalgias.  Skin:  Negative for rash.  Neurological:  Negative for dizziness, tingling, focal weakness, seizures, weakness and headaches.  Endo/Heme/Allergies:  Does not bruise/bleed easily.  Psychiatric/Behavioral:  Negative for depression and suicidal ideas. The patient does not have insomnia.       No Known Allergies   Past Medical History:  Diagnosis Date   Abnormal ECG    a. baseline inferolateral ST elevation dating back to at least 2012.   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   Sleep apnea    Urinary frequency    Weak urinary stream    Weight loss      Past Surgical History:  Procedure Laterality Date   biospy  09/30/2011-   TRUS/Bx Prostate - 5/12/biopsies positive for cancer, glandular size 32.5 cc   CHOLECYSTECTOMY  1992   RADIOACTIVE SEED IMPLANT  02/09/2012   Procedure: RADIOACTIVE SEED IMPLANT;  Surgeon: Franchot Gallo, MD;  Location: [place];  Service: Urology;  Laterality: N/A;  RAD TECH OK PER ANNE AT MAIN OR    UVULOPALATOPHARYNGOPLASTY     VIDEO BRONCHOSCOPY WITH ENDOBRONCHIAL ULTRASOUND Bilateral 11/12/2021   Procedure: VIDEO BRONCHOSCOPY WITH ENDOBRONCHIAL ULTRASOUND;  Surgeon: Ottie Glazier, MD;  Location: [facility];  Service: Thoracic;   Laterality: Bilateral;    Social History   Socioeconomic History   Marital status: Widowed    Spouse name: Not on file   Number of children: 3   Years of education: Not on file   Highest education level: High school graduate  Occupational History   Occupation: retired  Tobacco Use   Smoking status: Former    Packs/day: 2.00    Years: 28.00    Total pack years: 56.00    Types: Cigarettes    Quit date: 10/18/1980    Years since quitting: 42.0   Smokeless tobacco: Never  Substance and Sexual Activity   Alcohol use: No   Drug use: No   Sexual activity: Not on file    Comment: Low libido  Other Topics Concern   Not on file  Social History Narrative   Lives locally.  Was caring for his wife until she passed in December.  Sedentary.   Social Determinants of Health   Financial Resource Strain: Low Risk  (05/14/2019)   Overall Financial Resource Strain (CARDIA)    Difficulty of Paying Living Expenses: Not hard at all  Food Insecurity: No Food Insecurity (05/14/2019)   Hunger Vital Sign    Worried About Running Out of Food in the Last Year: Never true    Ran Out of Food in the Last Year: Never true  Transportation Needs: No Transportation Needs (05/14/2019)   PRAPARE - Hydrologist (Medical): No    Lack of Transportation (Non-Medical): No  Physical Activity: Inactive (05/14/2019)   Exercise Vital Sign    Days of Exercise per Week: 0 days    Minutes of Exercise per Session: 0 min  Stress: No Stress Concern Present (05/14/2019)   [place]    Feeling of Stress : Not at all  Social Connections: Unknown (05/14/2019)   Social Connection and Isolation Panel [NHANES]    Frequency of Communication with Friends and Family: Patient refused    Frequency of Social Gatherings with Friends and Family: Patient refused    Attends Religious Services: Patient refused    Active Member of Clubs or  Organizations: Patient refused    Attends Archivist Meetings: Patient refused    Marital Status: Patient refused  Intimate Partner Violence: Unknown (05/14/2019)   Humiliation, Afraid, Rape, and Kick questionnaire    Fear of Current or Ex-Partner: Patient refused    Emotionally Abused: Patient refused    Physically Abused: Patient refused    Sexually Abused: Patient refused    Family History  Problem Relation Age of Onset   Heart disease Father    Breast cancer Sister    Cancer Sister        lung   Lung cancer Brother    Cancer Brother        bladder   Lung cancer Sister    Cancer Sister        breast   Kidney disease Brother      Current Outpatient Medications:    acetaminophen (TYLENOL) 325 MG tablet, Take 650 mg by mouth every 6 (six) hours as needed., Disp: , Rfl:  TAGRISSO 80 MG tablet, TAKE ONE TABLET BY MOUTH ONCE DAILY AT THE SAME TIME. MAY TAKE WITH OR WITHOUT FOOD., Disp: 30 tablet, Rfl: 3   triamcinolone ointment (KENALOG) 0.5 %, Apply 1 Application topically 2 (two) times daily., Disp: 30 g, Rfl: 1  Physical exam:  Vitals:   11/12/22 1337  BP: 119/70  Pulse: 75  Resp: 16  Temp: (!) 96.8 F (36 C)  TempSrc: Tympanic  Weight: 151 lb 8 oz (68.7 kg)   Physical Exam Cardiovascular:     Rate and Rhythm: Normal rate and regular rhythm.     Heart sounds: Normal heart sounds.  Pulmonary:     Effort: Pulmonary effort is normal.     Breath sounds: Normal breath sounds.  Skin:    General: Skin is warm and dry.  Neurological:     Mental Status: He is alert and oriented to person, place, and time.         Latest Ref Rng & Units 11/12/2022    1:06 PM  CMP  Glucose 70 - 99 mg/dL 107   BUN 8 - 23 mg/dL 19   Creatinine 0.61 - 1.24 mg/dL 0.78   Sodium 135 - 145 mmol/L 136   Potassium 3.5 - 5.1 mmol/L 4.2   Chloride 98 - 111 mmol/L 98   CO2 22 - 32 mmol/L 28   Calcium 8.9 - 10.3 mg/dL 9.3   Total Protein 6.5 - 8.1 g/dL 7.3   Total Bilirubin 0.3  - 1.2 mg/dL 1.5   Alkaline Phos 38 - 126 U/L 61   AST 15 - 41 U/L 24   ALT 0 - 44 U/L 20       Latest Ref Rng & Units 11/12/2022    1:06 PM  CBC  WBC 4.0 - 10.5 K/uL 3.9   Hemoglobin 13.0 - 17.0 g/dL 13.0   Hematocrit 39.0 - 52.0 % 39.5   Platelets 150 - 400 K/uL 133     No images are attached to the encounter.  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 Left upper lobe primary diagnosed in February with right hip metastasis. Radiation to right hip and ongoing chemotherapy. Asymptomatic. Prostate cancer. * Tracking Code: BO * EXAM: CT CHEST, ABDOMEN, AND PELVIS WITH CONTRAST TECHNIQUE: Multidetector CT imaging of the chest, abdomen and pelvis was performed following the standard protocol during bolus administration of intravenous contrast. RADIATION DOSE REDUCTION: This exam was performed according to the departmental dose-optimization program which includes automated exposure control, adjustment of the mA and/or kV according to patient size and/or use of iterative reconstruction technique. CONTRAST:  176mL OMNIPAQUE IOHEXOL 300 MG/ML  SOLN COMPARISON:  06/07/2022 chest abdomen and pelvic CTs. FINDINGS: CT CHEST FINDINGS Cardiovascular: Aortic atherosclerosis. Normal heart size, without pericardial effusion. No central pulmonary embolism, on this non-dedicated study. Mediastinum/Nodes: No supraclavicular adenopathy. Subcarinal node measures 10 mm today versus 11 mm on the prior. AP window adenopathy at 1.9 cm in 23/2 versus 1.6 cm on the prior exam (when remeasured). Left suprahilar node measures 11 mm on 29/2 versus 12 mm on the prior. Esophageal air-fluid level. Lungs/Pleura: Trace left pleural fluid. Bilateral calcified pleural plaques. Moderate centrilobular emphysema. Left upper lobe endobronchial narrowing and occlusion are similar. The left upper lobe spiculated lung mass 3.8 x 3.3 cm on 58/3 versus 3.6 x 4.4  cm on the prior exam. Based on coronal reformats, 3.6 cm craniocaudal today versus similar on  the prior. More peripheral left upper lobe areas of ground-glass and nodular consolidation are increased. A new area of nodularity anteriorly at 11 mm on 53/3. Left apical consolidation on 31/3 is at the site of nodularity on the prior. Bibasilar atelectasis. Musculoskeletal: No acute osseous abnormality. CT ABDOMEN PELVIS FINDINGS Hepatobiliary: Multiple hepatic cysts and bile duct hamartomas again identified. An anterior right liver (segment 8) solid hypoattenuating lesion measures 3.3 x 2.5 cm on 58/2. Subtly present but obscured by cysts at 1.6 x 1.5 cm on the prior exam (when remeasured). Cholecystectomy, without biliary ductal dilatation. Pancreas: Normal, without mass or ductal dilatation. Spleen: Normal in size, without focal abnormality. Adrenals/Urinary Tract: Normal adrenal glands. Too small to characterize low-density bilateral renal lesions are likely cysts . In the absence of clinically indicated signs/symptoms require(s) no independent follow-up. No hydronephrosis. Normal urinary bladder. Stomach/Bowel: Surgical sutures in the proximal stomach. Normal colon, appendix, and terminal ileum. Normal small bowel. Vascular/Lymphatic: Advanced aortic and branch vessel atherosclerosis. No abdominopelvic adenopathy. Reproductive: Radiation seeds in the prostate. Other: No significant free fluid. No evidence of omental or peritoneal disease. Musculoskeletal: Right iliac sclerotic and lytic lesion is grossly similar at 4.1 x 2.4 cm on 96/2. IMPRESSION: 1. The left upper lobe centrally obstructive primary is similar to minimally decreased in size since 06/07/2022. 2. Mixed response to therapy of thoracic adenopathy. 3. Worsened more peripheral left upper lobe aeration, most likely related to postobstructive pneumonitis. A component of lymphangitic tumor spread and/or satellite pulmonary metastasis could look similar. 4.  Progressive hepatic metastasis. 5. Similar treated right iliac osseous metastasis. 6. Esophageal air fluid level suggests dysmotility or gastroesophageal reflux. 7. Trace left pleural fluid. 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 Electronically Signed   By: Abigail Miyamoto M.D.   On: 11/08/2022 11:38   DG Chest 2 View  Result Date: 10/20/2022 CLINICAL DATA:  Right-sided chest pain. EXAM: CHEST - 2 VIEW COMPARISON:  November 12, 2021.  June 07, 2022. FINDINGS: The heart size and mediastinal contours are within normal limits. Left upper lobe mass noted on prior exam is significantly smaller. Calcified pleural plaques are again noted bilaterally. The visualized skeletal structures are unremarkable. IMPRESSION: Left upper lobe lung mass is smaller compared to prior exam. 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 Electronically Signed   By: Marijo Conception M.D.   On: 10/20/2022 16:24     Assessment and plan- Patient is a 82 y.o. male with metastatic EGFR positive lung cancer on Tagrisso here to discuss CT scan results and further management  I have reviewed CT chest abdomen pelvis images independently and discussed findings with the patient.  Overall the left upper lobe lung mass has remained more or less stable in size.  There are changes noted in the left upper lobe however which can be either infectious versus lymphangitic spread.  Mediastinal adenopathy has had a mixed response with some nodes appearing smaller and others appearing larger.  There was a liver lesion which was not reported on his prior CT but was present back then and measured about 1.5 cm and is grown to 3 cm.  Overall this would be consistent with disease progression.  I would like to proceed with ultrasound-guided liver biopsy at this time to see if patient has developed any acquired mutations that could be targetable.  For example if he has met amplification I will  consider adding tepotinib to [place].  His prior NGS testing did show tumor mutational  burden was high although PD-L1 was less than 1%.  If no actionable mutations either combination chemoimmunotherapy or single agent immunotherapy would be an option to consider.  I will see him back in 1 month after liver biopsy and repeat NGS testing is completed.  Patient will remain on Tagrisso until then.   Visit Diagnosis 1. Primary cancer of left upper lobe of lung ([place])   2. Goals of care, counseling/discussion   3. High risk medication use      Dr. Randa Evens, MD, MPH [facility] [phone] 11/12/2022 3:59 PM

## 2022-11-12 NOTE — Progress Notes (Signed)
Pt in for follow up and CT scan.  Pt reports decreased appetite, ony eating a few bites and becomes full.  Pt states he drinks 2 ensure daily.

## 2022-11-15 ENCOUNTER — Encounter: Payer: Self-pay | Admitting: *Deleted

## 2022-11-15 NOTE — Progress Notes (Signed)
Pt scheduled for liver biopsy on Mon 2/12 at 830am with 730am arrival. Pt's daughter made aware of appt. Informed that may need to reschedule pt's follow up with Dr. Janese Banks on 2/27 to give more time for NGS testing to be reported. At this time will keep as scheduled and reschedule closer to that time if needed. [name] verbalized understanding. Nothing further needed.

## 2022-11-17 ENCOUNTER — Ambulatory Visit: Payer: Medicare Other | Admitting: Dermatology

## 2022-11-18 ENCOUNTER — Other Ambulatory Visit (HOSPITAL_COMMUNITY): Payer: Self-pay

## 2022-11-24 ENCOUNTER — Inpatient Hospital Stay: Payer: Medicare Other | Attending: Hospice and Palliative Medicine | Admitting: Hospice and Palliative Medicine

## 2022-11-24 ENCOUNTER — Inpatient Hospital Stay: Payer: Medicare Other

## 2022-11-24 DIAGNOSIS — C3412 Malignant neoplasm of upper lobe, left bronchus or lung: Secondary | ICD-10-CM | POA: Diagnosis not present

## 2022-11-24 DIAGNOSIS — Z803 Family history of malignant neoplasm of breast: Secondary | ICD-10-CM | POA: Insufficient documentation

## 2022-11-24 DIAGNOSIS — C787 Secondary malignant neoplasm of liver and intrahepatic bile duct: Secondary | ICD-10-CM | POA: Insufficient documentation

## 2022-11-24 DIAGNOSIS — Z515 Encounter for palliative care: Secondary | ICD-10-CM | POA: Diagnosis not present

## 2022-11-24 DIAGNOSIS — Z801 Family history of malignant neoplasm of trachea, bronchus and lung: Secondary | ICD-10-CM | POA: Insufficient documentation

## 2022-11-24 DIAGNOSIS — C7951 Secondary malignant neoplasm of bone: Secondary | ICD-10-CM | POA: Insufficient documentation

## 2022-11-24 DIAGNOSIS — Z923 Personal history of irradiation: Secondary | ICD-10-CM | POA: Insufficient documentation

## 2022-11-24 DIAGNOSIS — Z87891 Personal history of nicotine dependence: Secondary | ICD-10-CM | POA: Insufficient documentation

## 2022-11-24 NOTE — Progress Notes (Signed)
Nutrition Assessment   Reason for Assessment:  Referral for poor appetite   ASSESSMENT:  82 year old male with lung cancer, stage IV with bone metastases and probable liver metastasis.    Patient scheduled as in person visit but did not show up.    Spoke with daughter and patient via phone.  Daughter, [name] reports that she is working with the [facility] to get an appointment with the [place], Dietitian.  [facility]oping [facility] will be able [facility].  Patient reports that he eats a few bites then gets full.  Denies nausea or constipation.  Patient eating some [place] food just before the call.      Medications: reviewed   Labs: reviewed   Anthropometrics:   Weight 151 lb 8 oz on 1/26 154 lb 9.6 oz on 1/5 146 lb 4.8 oz on 9/6 154 lb on 7/3  2% weight loss in the last month  NUTRITION DIAGNOSIS: Inadequate oral intake related to cancer related treatment side effects as evidenced by reported poor appetite and 2% weight loss in the last month   INTERVENTION:  Discussed ways to add calories to diet  Encouraged 350 calorie shake or higher Contact information provided.  Daughter to contact RD if needed in the future   MONITORING, EVALUATION, GOAL: weight trends, intake   Next Visit: no follow-up Dtr to call RD if needed in the future.  Connecting with [facility], Dietitian  Fritzie Prioleau B. Zenia Resides, [place], [place] Registered Dietitian [phone]

## 2022-11-24 NOTE — Progress Notes (Signed)
Virtual Visit via Telephone Note  I connected with Jared Mullen on 11/24/22 at  1:00 PM EST by telephone and verified that I am speaking with the correct person using two identifiers.  Location: Patient: Home Provider: Clinic   I discussed the limitations, risks, security and privacy concerns of performing an evaluation and management service by telephone and the availability of in person appointments. I also discussed with the patient that there may be a patient responsible charge related to this service. The patient expressed understanding and agreed to proceed.   History of Present Illness: Jared Mullen is a 82 y.o. male with multiple medical problems including with multiple medical problems including with multiple medical problems including history of prostate cancer, sleep apnea, admitted to the hospital 11/10/2021 for evaluation of progressive weakness and weight loss.  CTA of the chest revealed a left upper lobe lung mass.  CT of the abdomen and pelvis revealed a lytic lesion to the right iliac crest.  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  Palliative care was consulted to help address goals.   Observations/Objective: Spoke with patient by phone.    Patient reports he is doing well.  He denies any significant changes or concerns.  He is scheduled for ultrasound liver biopsy on 2/12 to see if he has any targetable mutations for probable progression.  Patient says he has followed up with the [facility] dermatologist.  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 The patient was provided an opportunity to ask questions and all were answered. The patient agreed with the plan and demonstrated an understanding of the instructions.   The patient was advised  to call back or seek an in-person evaluation if the symptoms worsen or if the condition fails to improve as anticipated.  I provided 5 minutes of non-face-to-face time during this encounter.   [name], NP

## 2022-11-26 ENCOUNTER — Other Ambulatory Visit (HOSPITAL_COMMUNITY): Payer: Self-pay | Admitting: Student

## 2022-11-26 DIAGNOSIS — C3412 Malignant neoplasm of upper lobe, left bronchus or lung: Secondary | ICD-10-CM

## 2022-11-26 NOTE — Progress Notes (Signed)
Patient for US guided Liver Mass Biopsy on Mon 11/29/22, I LVM for pt's daughter [name] and gave pre-procedure instructions.  VM left made pt's daughter aware to have pt here at 7:30a at the new entrance, NPO after MN prior to procedure as well as driver post procedure/recovery/discharge.   Called 11/26/2022

## 2022-11-29 ENCOUNTER — Ambulatory Visit
Admission: RE | Admit: 2022-11-29 | Discharge: 2022-11-29 | Disposition: A | Discharge status: Home / Self Care | Payer: Medicare Other | Source: Ambulatory Visit | Attending: Oncology | Admitting: Oncology

## 2022-11-29 DIAGNOSIS — Z85118 Personal history of other malignant neoplasm of bronchus and lung: Secondary | ICD-10-CM | POA: Diagnosis not present

## 2022-11-29 DIAGNOSIS — C3412 Malignant neoplasm of upper lobe, left bronchus or lung: Secondary | ICD-10-CM

## 2022-11-29 DIAGNOSIS — K769 Liver disease, unspecified: Secondary | ICD-10-CM | POA: Insufficient documentation

## 2022-11-29 DIAGNOSIS — C787 Secondary malignant neoplasm of liver and intrahepatic bile duct: Secondary | ICD-10-CM | POA: Diagnosis not present

## 2022-11-29 LAB — CBC
HCT: 38.7 % — ABNORMAL LOW (ref 39.0–52.0)
Hemoglobin: 12.6 g/dL — ABNORMAL LOW (ref 13.0–17.0)
MCH: 30.5 pg (ref 26.0–34.0)
MCHC: 32.6 g/dL (ref 30.0–36.0)
MCV: 93.7 fL (ref 80.0–100.0)
Platelets: 159 10*3/uL (ref 150–400)
RBC: 4.13 MIL/uL — ABNORMAL LOW (ref 4.22–5.81)
RDW: 13.9 % (ref 11.5–15.5)
WBC: 5.1 10*3/uL (ref 4.0–10.5)
nRBC: 0 % (ref 0.0–0.2)

## 2022-11-29 LAB — PROTIME-INR
INR: 1.1 (ref 0.8–1.2)
Prothrombin Time: 14 seconds (ref 11.4–15.2)

## 2022-11-29 MED ORDER — FENTANYL CITRATE (PF) 100 MCG/2ML IJ SOLN
INTRAMUSCULAR | Status: AC
  Filled 2022-11-29: qty 2

## 2022-11-29 MED ORDER — FENTANYL CITRATE (PF) 100 MCG/2ML IJ SOLN
INTRAMUSCULAR | Status: AC | PRN
  Administered 2022-11-29: 25 ug via INTRAVENOUS
  Administered 2022-11-29: 50 ug via INTRAVENOUS

## 2022-11-29 MED ORDER — LIDOCAINE HCL (PF) 1 % IJ SOLN
10.0000 mL | Freq: Once | INTRAMUSCULAR | Status: AC
  Administered 2022-11-29: 10 mL via INTRADERMAL

## 2022-11-29 MED ORDER — SODIUM CHLORIDE 0.9 % IV SOLN
INTRAVENOUS | Status: DC
  Administered 2022-11-29: 1000 mL via INTRAVENOUS

## 2022-11-29 MED ORDER — MIDAZOLAM HCL 5 MG/5ML IJ SOLN
INTRAMUSCULAR | Status: AC | PRN
  Administered 2022-11-29: .5 mg via INTRAVENOUS
  Administered 2022-11-29: 1 mg via INTRAVENOUS

## 2022-11-29 MED ORDER — MIDAZOLAM HCL 2 MG/2ML IJ SOLN
INTRAMUSCULAR | Status: AC
  Filled 2022-11-29: qty 2

## 2022-11-29 NOTE — Progress Notes (Signed)
Patient clinically stable post Liver biopsy per DR El-ABD, tolerated well. Vitals stable pre and post procedure. Received Versed 1.5 mg along with Fentanyl 75 mcg IV for procedure. Denies complaints post procedure. Report given to Carlynn Spry RN post procedure/330.

## 2022-11-29 NOTE — H&P (Signed)
Chief Complaint: Patient was seen in consultation today for image-guided liver biopsy  Referring Physician(s): Brem[place] C  Supervising Physician: Juliet Rude  Patient Status: Jared Mullen - Out-pt  History of Present Illness: Jared Mullen is a 82 y.o. male with PMH significant for lung cancer, prostate cancer, and sleep apnea being seen today for image-guided liver biopsy. CT Chest/Abdomen/Pelvis from 11/08/22 revealed progressive hepatic metastasis with lesion measuring 3.3 x 2.5 cm in the anterior right liver. Dr Janese Banks from Oncology has referred the patient to IR for image-guided biopsy of liver lesion.  Past Medical History:  Diagnosis Date   Abnormal ECG    a. baseline inferolateral ST elevation dating back to at least 2012.   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   Sleep apnea    Urinary frequency    Weak urinary stream    Weight loss     Past Surgical History:  Procedure Laterality Date   biospy  09/30/2011-   TRUS/Bx Prostate - 5/12/biopsies positive for cancer, glandular size 32.5 cc   CHOLECYSTECTOMY  1992   RADIOACTIVE SEED IMPLANT  02/09/2012   Procedure: RADIOACTIVE SEED IMPLANT;  Surgeon: Franchot Gallo, MD;  Location: [facility];  Service: Urology;  Laterality: N/A;  RAD TECH OK PER ANNE AT MAIN OR    UVULOPALATOPHARYNGOPLASTY     VIDEO BRONCHOSCOPY WITH ENDOBRONCHIAL ULTRASOUND Bilateral 11/12/2021   Procedure: VIDEO BRONCHOSCOPY WITH ENDOBRONCHIAL ULTRASOUND;  Surgeon: Ottie Glazier, MD;  Location: [facility];  Service: Thoracic;  Laterality: Bilateral;    Allergies: Patient has no known allergies.  Medications: Prior to Admission medications   Medication Sig Start Date End Date Taking? Authorizing Provider  TAGRISSO 80 MG tablet TAKE ONE TABLET BY MOUTH ONCE DAILY AT THE SAME TIME. MAY TAKE WITH OR  WITHOUT FOOD. 10/29/22  Yes Sindy Guadeloupe, MD  acetaminophen (TYLENOL) 325 MG tablet Take 650 mg by mouth every 6 (six) hours as needed.    [provider]  triamcinolone ointment (KENALOG) 0.5 % Apply 1 Application topically 2 (two) times daily. 09/02/22   Borders, Kirt Boys, NP     Family History  Problem Relation Age of Onset   Heart disease Father    Breast cancer Sister    Cancer Sister        lung   Lung cancer Brother    Cancer Brother        bladder   Lung cancer Sister    Cancer Sister        breast   Kidney disease Brother     Social History   Socioeconomic History   Marital status: Widowed    Spouse name: Not on file   Number of children: 3   Years of education: Not on file   Highest education level: High school graduate  Occupational History   Occupation: retired  Tobacco Use   Smoking status: Former    Packs/day: 2.00    Years: 28.00    Total pack years: 56.00    Types: Cigarettes    Quit date: 10/18/1980    Years since quitting: 42.1   Smokeless tobacco: Never  Vaping Use   Vaping Use: Never used  Substance and Sexual Activity   Alcohol use: No   Drug use: No   Sexual activity: Not on file  Comment: Low libido  Other Topics Concern   Not on file  Social History Narrative   Lives locally.  Was caring for his wife until she passed in December.  Sedentary.   Social Determinants of Health   Financial Resource Strain: Low Risk  (05/14/2019)   Overall Financial Resource Strain (CARDIA)    Difficulty of Paying Living Expenses: Not hard at all  Food Insecurity: No Food Insecurity (05/14/2019)   Hunger Vital Sign    Worried About Running Out of Food in the Last Year: Never true    Ran Out of Food in the Last Year: Never true  Transportation Needs: No Transportation Needs (05/14/2019)   PRAPARE - Hydrologist (Medical): No    Lack of Transportation (Non-Medical): No  Physical Activity: Inactive (05/14/2019)    Exercise Vital Sign    Days of Exercise per Week: 0 days    Minutes of Exercise per Session: 0 min  Stress: No Stress Concern Present (05/14/2019)   [place]    Feeling of Stress : Not at all  Social Connections: Unknown (05/14/2019)   Social Connection and Isolation Panel [NHANES]    Frequency of Communication with Friends and Family: Patient refused    Frequency of Social Gatherings with Friends and Family: Patient refused    Attends Religious Services: Patient refused    Marine scientist or Organizations: Patient refused    Attends Archivist Meetings: Patient refused    Marital Status: Patient refused   Code Status: Full Code  Review of Systems: A 12 point ROS discussed and pertinent positives are indicated in the HPI above.  All other systems are negative.  Review of Systems  Constitutional:  Negative for chills and fever.  Respiratory:  Negative for chest tightness and shortness of breath.   Cardiovascular:  Negative for chest pain and leg swelling.  Gastrointestinal:  Negative for abdominal pain, diarrhea, nausea and vomiting.  Neurological:  Negative for dizziness and headaches.  Psychiatric/Behavioral:  Negative for confusion.     Vital Signs: BP 130/81   Pulse 68   Temp 97.8 F (36.6 C) (Oral)   Resp 14   Ht 5\' 9"  (1.753 m)   Wt 150 lb (68 kg)   SpO2 96%   BMI 22.15 kg/m    Physical Exam Vitals reviewed.  Constitutional:      General: He is not in acute distress.    Appearance: He is ill-appearing.  HENT:     Mouth/Throat:     Mouth: Mucous membranes are moist.  Eyes:     Pupils: Pupils are equal, round, and reactive to light.  Cardiovascular:     Rate and Rhythm: Normal rate and regular rhythm.     Pulses: Normal pulses.     Heart sounds: Normal heart sounds.  Pulmonary:     Effort: Pulmonary effort is normal.     Breath sounds: Normal breath sounds.  Abdominal:      General: Bowel sounds are normal.     Palpations: Abdomen is soft.     Tenderness: There is no abdominal tenderness.  Musculoskeletal:     Right lower leg: No edema.     Left lower leg: No edema.  Skin:    General: Skin is warm and dry.  Neurological:     Mental Status: He is alert and oriented to person, place, and time.  Psychiatric:  Mood and Affect: Mood normal.        Behavior: Behavior normal.        Thought Content: Thought content normal.        Judgment: Judgment normal.     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 Left upper lobe primary diagnosed in February with right hip metastasis. Radiation to right hip and ongoing chemotherapy. Asymptomatic. Prostate cancer. * Tracking Code: BO * EXAM: CT CHEST, ABDOMEN, AND PELVIS WITH CONTRAST TECHNIQUE: Multidetector CT imaging of the chest, abdomen and pelvis was performed following the standard protocol during bolus administration of intravenous contrast. RADIATION DOSE REDUCTION: This exam was performed according to the departmental dose-optimization program which includes automated exposure control, adjustment of the mA and/or kV according to patient size and/or use of iterative reconstruction technique. CONTRAST:  165mL OMNIPAQUE IOHEXOL 300 MG/ML  SOLN COMPARISON:  06/07/2022 chest abdomen and pelvic CTs. FINDINGS: CT CHEST FINDINGS Cardiovascular: Aortic atherosclerosis. Normal heart size, without pericardial effusion. No central pulmonary embolism, on this non-dedicated study. Mediastinum/Nodes: No supraclavicular adenopathy. Subcarinal node measures 10 mm today versus 11 mm on the prior. AP window adenopathy at 1.9 cm in 23/2 versus 1.6 cm on the prior exam (when remeasured). Left suprahilar node measures 11 mm on 29/2 versus 12 mm on the prior. Esophageal air-fluid level. Lungs/Pleura: Trace left pleural fluid. Bilateral calcified  pleural plaques. Moderate centrilobular emphysema. Left upper lobe endobronchial narrowing and occlusion are similar. The left upper lobe spiculated lung mass 3.8 x 3.3 cm on 58/3 versus 3.6 x 4.4 cm on the prior exam. Based on coronal reformats, 3.6 cm craniocaudal today versus similar on the prior. More peripheral left upper lobe areas of ground-glass and nodular consolidation are increased. A new area of nodularity anteriorly at 11 mm on 53/3. Left apical consolidation on 31/3 is at the site of nodularity on the prior. Bibasilar atelectasis. Musculoskeletal: No acute osseous abnormality. CT ABDOMEN PELVIS FINDINGS Hepatobiliary: Multiple hepatic cysts and bile duct hamartomas again identified. An anterior right liver (segment 8) solid hypoattenuating lesion measures 3.3 x 2.5 cm on 58/2. Subtly present but obscured by cysts at 1.6 x 1.5 cm on the prior exam (when remeasured). Cholecystectomy, without biliary ductal dilatation. Pancreas: Normal, without mass or ductal dilatation. Spleen: Normal in size, without focal abnormality. Adrenals/Urinary Tract: Normal adrenal glands. Too small to characterize low-density bilateral renal lesions are likely cysts . In the absence of clinically indicated signs/symptoms require(s) no independent follow-up. No hydronephrosis. Normal urinary bladder. Stomach/Bowel: Surgical sutures in the proximal stomach. Normal colon, appendix, and terminal ileum. Normal small bowel. Vascular/Lymphatic: Advanced aortic and branch vessel atherosclerosis. No abdominopelvic adenopathy. Reproductive: Radiation seeds in the prostate. Other: No significant free fluid. No evidence of omental or peritoneal disease. Musculoskeletal: Right iliac sclerotic and lytic lesion is grossly similar at 4.1 x 2.4 cm on 96/2. IMPRESSION: 1. The left upper lobe centrally obstructive primary is similar to minimally decreased in size since 06/07/2022. 2. Mixed response to therapy of thoracic adenopathy. 3. Worsened  more peripheral left upper lobe aeration, most likely related to postobstructive pneumonitis. A component of lymphangitic tumor spread and/or satellite pulmonary metastasis could look similar. 4. Progressive hepatic metastasis. 5. Similar treated right iliac osseous metastasis. 6. Esophageal air fluid level suggests dysmotility or gastroesophageal reflux. 7. Trace left pleural fluid. 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 Electronically Signed   By: Abigail Miyamoto  M.D.   On: 11/08/2022 11:38    Labs:  CBC: Recent Labs    06/23/22 0930 08/24/22 1348 11/12/22 1306 11/29/22 0803  WBC 3.8* 4.7 3.9* 5.1  HGB 12.8* 12.6* 13.0 12.6*  HCT 38.7* 38.4* 39.5 38.7*  PLT 109* 155 133* 159    COAGS: Recent Labs    11/29/22 0803  INR 1.1    BMP: Recent Labs    04/19/22 1000 06/07/22 0902 06/23/22 0930 08/24/22 1348 11/08/22 1001 11/12/22 1306  NA 138  --  137 138  --  136  K 4.7  --  4.1 4.3  --  4.2  CL 102  --  102 100  --  98  CO2 30  --  30 27  --  28  GLUCOSE 105*  --  111* 105*  --  107*  BUN 16  --  23 28*  --  19  CALCIUM 9.0  --  9.6 9.8  --  9.3  CREATININE 0.78   < > 0.97 0.84 0.70 0.78  GFRNONAA >60  --  >60 >60  --  >60   < > = values in this interval not displayed.    LIVER FUNCTION TESTS: Recent Labs    04/19/22 1000 06/23/22 0930 08/24/22 1348 11/12/22 1306  BILITOT 1.2 1.3* 0.9 1.5*  AST 20 22 25 24   ALT 15 16 20 20   ALKPHOS 51 49 54 61  PROT 6.8 7.2 7.4 7.3  ALBUMIN 3.6 3.9 4.0 3.8    TUMOR MARKERS: No results for input(s): "AFPTM", "CEA", "CA199", "CHROMGRNA" in the last 8760 hours.  Assessment and Plan:   Jared Mullen is an 82 yo male with lung cancer being seen today in relation to a progressive metastatic mass in his right anterior liver. The patient presents today in his usual state of health and is accompanied by his daughter. The case has been approved by Dr Earleen Newport and reviewed with Dr Denna Haggard.  Risks  and benefits of image-guided liver biopsy was discussed with the patient and/or patient's family including, but not limited to bleeding, infection, damage to adjacent structures or low yield requiring additional tests.  All of the questions were answered and there is agreement to proceed.  Consent signed and in chart.    Thank you for this interesting consult.  I greatly enjoyed meeting Jared Mullen and look forward to participating in their care.  A copy of this report was sent to the requesting provider on this date.  Electronically Signed: Lura Em, PA-C 11/29/2022, 8:32 AM   I spent a total of  40 Minutes   in face to face in clinical consultation, greater than 50% of which was counseling/coordinating care for image-guided liver biopsy.

## 2022-11-29 NOTE — Procedures (Signed)
Interventional Radiology Procedure Note  Date of Procedure: 11/29/2022  Procedure: US liver lesion biopsy   Findings:  1. US liver lesion biopsy right lobe 18ga x4 cores   2. Gelfoam slurry for hemostasis   Complications: No immediate complications noted.   Estimated Blood Loss: minimal  Follow-up and Recommendations: 1. Bedrest 2 hours    Albin Felling, MD  Vascular & Interventional Radiology  11/29/2022 9:06 AM

## 2022-11-30 ENCOUNTER — Telehealth: Payer: Self-pay

## 2022-11-30 NOTE — Telephone Encounter (Signed)
4 pm.  Phone call made to patient to offer a home visit with Palliative Care.  Patient declines services and did not believe he needed services at this time.  Patient aware PCP can send a referral in should services be needed in the future.

## 2022-12-01 ENCOUNTER — Encounter: Payer: Self-pay | Admitting: *Deleted

## 2022-12-01 LAB — SURGICAL PATHOLOGY

## 2022-12-01 NOTE — Progress Notes (Signed)
Per Dr. Janese Banks, will need to order NGS on recent liver biopsy. Request for omniseq testing faxed to pathology to send out.

## 2022-12-14 ENCOUNTER — Encounter: Payer: Self-pay | Admitting: Oncology

## 2022-12-14 ENCOUNTER — Inpatient Hospital Stay: Payer: Medicare Other

## 2022-12-14 ENCOUNTER — Inpatient Hospital Stay (HOSPITAL_BASED_OUTPATIENT_CLINIC_OR_DEPARTMENT_OTHER): Payer: Medicare Other | Admitting: Oncology

## 2022-12-14 VITALS — BP 122/73 | HR 86 | Temp 95.8°F | Resp 18 | Ht 69.0 in | Wt 153.4 lb

## 2022-12-14 DIAGNOSIS — Z923 Personal history of irradiation: Secondary | ICD-10-CM | POA: Diagnosis not present

## 2022-12-14 DIAGNOSIS — Z803 Family history of malignant neoplasm of breast: Secondary | ICD-10-CM | POA: Diagnosis not present

## 2022-12-14 DIAGNOSIS — C787 Secondary malignant neoplasm of liver and intrahepatic bile duct: Secondary | ICD-10-CM | POA: Diagnosis not present

## 2022-12-14 DIAGNOSIS — Z7189 Other specified counseling: Secondary | ICD-10-CM

## 2022-12-14 DIAGNOSIS — C7951 Secondary malignant neoplasm of bone: Secondary | ICD-10-CM | POA: Diagnosis not present

## 2022-12-14 DIAGNOSIS — C3412 Malignant neoplasm of upper lobe, left bronchus or lung: Secondary | ICD-10-CM

## 2022-12-14 DIAGNOSIS — Z801 Family history of malignant neoplasm of trachea, bronchus and lung: Secondary | ICD-10-CM | POA: Diagnosis not present

## 2022-12-14 DIAGNOSIS — Z87891 Personal history of nicotine dependence: Secondary | ICD-10-CM | POA: Diagnosis not present

## 2022-12-14 LAB — COMPREHENSIVE METABOLIC PANEL
ALT: 17 U/L (ref 0–44)
AST: 24 U/L (ref 15–41)
Albumin: 3.7 g/dL (ref 3.5–5.0)
Alkaline Phosphatase: 63 U/L (ref 38–126)
Anion gap: 7 (ref 5–15)
BUN: 18 mg/dL (ref 8–23)
CO2: 29 mmol/L (ref 22–32)
Calcium: 9.4 mg/dL (ref 8.9–10.3)
Chloride: 100 mmol/L (ref 98–111)
Creatinine, Ser: 0.85 mg/dL (ref 0.61–1.24)
GFR, Estimated: >60 mL/min (ref >60)
Glucose, Bld: 102 mg/dL — ABNORMAL HIGH (ref 70–99)
Potassium: 4.1 mmol/L (ref 3.5–5.1)
Sodium: 136 mmol/L (ref 135–145)
Total Bilirubin: 1.1 mg/dL (ref 0.3–1.2)
Total Protein: 7.2 g/dL (ref 6.5–8.1)

## 2022-12-14 LAB — CBC WITH DIFFERENTIAL/PLATELET
Abs Immature Granulocytes: 0.01 10*3/uL (ref 0.00–0.07)
Basophils Absolute: 0 10*3/uL (ref 0.0–0.1)
Basophils Relative: 0 %
Eosinophils Absolute: 0.2 10*3/uL (ref 0.0–0.5)
Eosinophils Relative: 3 %
HCT: 41.5 % (ref 39.0–52.0)
Hemoglobin: 13.6 g/dL (ref 13.0–17.0)
Immature Granulocytes: 0 %
Lymphocytes Relative: 11 %
Lymphs Abs: 0.5 10*3/uL — ABNORMAL LOW (ref 0.7–4.0)
MCH: 31.1 pg (ref 26.0–34.0)
MCHC: 32.8 g/dL (ref 30.0–36.0)
MCV: 94.7 fL (ref 80.0–100.0)
Monocytes Absolute: 0.8 10*3/uL (ref 0.1–1.0)
Monocytes Relative: 16 %
Neutro Abs: 3.5 10*3/uL (ref 1.7–7.7)
Neutrophils Relative %: 70 %
Platelets: 158 10*3/uL (ref 150–400)
RBC: 4.38 MIL/uL (ref 4.22–5.81)
RDW: 14.1 % (ref 11.5–15.5)
WBC: 5 10*3/uL (ref 4.0–10.5)
nRBC: 0 % (ref 0.0–0.2)

## 2022-12-14 NOTE — Progress Notes (Signed)
Hematology/Oncology Consult note [facility]  [phone] [phone]  Patient Care Team: Birdie Sons, MD as PCP - General (Family Medicine) Rockey Situ Kathlene November, MD as PCP - Cardiology (Cardiology) Franchot Gallo, MD as Consulting Physician (Urology) Telford Nab, RN as Oncology Nurse Navigator   Name of the patient: Jared Mullen  [id number]  06-17-1941   Date of visit: 12/14/22  Diagnosis- metastatic EGFR positive lung cancer   Chief complaint/ Reason for visit-discuss biopsy results and further management  Heme/Onc history: Patient is a 82 year old male who was admitted to the hospital in January 2023 with symptoms of generalized weakness cough and ongoing weight loss.  CT chest showed left perihilar upper lobe masslike opacity measuring 6.6 x 5 x 6.1 cm contiguous with the hilum.  Left hilar adenopathy as well as paratracheal adenopathy.  Severe narrowing of the left upper lobe bronchus due to left perihilar mass.  CT abdomen and pelvis with contrast showed large lytic lesion involving the right iliac bone.  MRI brain with and without contrast did not show any evidence of metastatic disease.  Patient underwent bronchoscopy with biopsy of the left hilar mass which was consistent with adenocarcinoma.   NGS testing showed EGFR L858R mutation that could be targetable with Tagrisso.  Tumor mutational burden high.  PD-L1 less than 1%.  No other actionable mutations.  Patient has been on Tagrisso since January 2023  Interval history-patient has an upcoming cataract surgery at the [place].  He has baseline fatigue.  No recent falls.  Tolerating Tagrisso otherwise well without any significant side effects.  Denies any significant pain  ECOG PS- 1 Pain scale- 0 Opioid associated constipation- no  Review of systems- Review of Systems  Constitutional:  Positive for malaise/fatigue. Negative for chills, fever and weight loss.  HENT:  Negative for  congestion, ear discharge and nosebleeds.   Eyes:  Negative for blurred vision.  Respiratory:  Negative for cough, hemoptysis, sputum production, shortness of breath and wheezing.   Cardiovascular:  Negative for chest pain, palpitations, orthopnea and claudication.  Gastrointestinal:  Negative for abdominal pain, blood in stool, constipation, diarrhea, heartburn, melena, nausea and vomiting.  Genitourinary:  Negative for dysuria, flank pain, frequency, hematuria and urgency.  Musculoskeletal:  Negative for back pain, joint pain and myalgias.  Skin:  Negative for rash.  Neurological:  Negative for dizziness, tingling, focal weakness, seizures, weakness and headaches.  Endo/Heme/Allergies:  Does not bruise/bleed easily.  Psychiatric/Behavioral:  Negative for depression and suicidal ideas. The patient does not have insomnia.       No Known Allergies   Past Medical History:  Diagnosis Date   Abnormal ECG    a. baseline inferolateral ST elevation dating back to at least 2012.   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   Sleep apnea    Urinary frequency    Weak urinary stream    Weight loss      Past Surgical History:  Procedure Laterality Date   biospy  09/30/2011-   TRUS/Bx Prostate - 5/12/biopsies positive for cancer, glandular size 32.5 cc   CHOLECYSTECTOMY  1992   RADIOACTIVE SEED IMPLANT  02/09/2012   Procedure: RADIOACTIVE SEED IMPLANT;  Surgeon: Franchot Gallo, MD;  Location: [facility];  Service: Urology;  Laterality: N/A;  RAD TECH OK  PER ANNE AT MAIN OR    UVULOPALATOPHARYNGOPLASTY     VIDEO BRONCHOSCOPY WITH ENDOBRONCHIAL ULTRASOUND Bilateral 11/12/2021   Procedure: VIDEO BRONCHOSCOPY WITH ENDOBRONCHIAL ULTRASOUND;  Surgeon: Ottie Glazier, MD;  Location: [facility];  Service: Thoracic;  Laterality: Bilateral;     Social History   Socioeconomic History   Marital status: Widowed    Spouse name: Not on file   Number of children: 3   Years of education: Not on file   Highest education level: High school graduate  Occupational History   Occupation: retired  Tobacco Use   Smoking status: Former    Packs/day: 2.00    Years: 28.00    Total pack years: 56.00    Types: Cigarettes    Quit date: 10/18/1980    Years since quitting: 42.1   Smokeless tobacco: Never  Vaping Use   Vaping Use: Never used  Substance and Sexual Activity   Alcohol use: No   Drug use: No   Sexual activity: Not on file    Comment: Low libido  Other Topics Concern   Not on file  Social History Narrative   Lives locally.  Was caring for his wife until she passed in December.  Sedentary.   Social Determinants of Health   Financial Resource Strain: Low Risk  (05/14/2019)   Overall Financial Resource Strain (CARDIA)    Difficulty of Paying Living Expenses: Not hard at all  Food Insecurity: No Food Insecurity (05/14/2019)   Hunger Vital Sign    Worried About Running Out of Food in the Last Year: Never true    Ran Out of Food in the Last Year: Never true  Transportation Needs: No Transportation Needs (05/14/2019)   PRAPARE - Hydrologist (Medical): No    Lack of Transportation (Non-Medical): No  Physical Activity: Inactive (05/14/2019)   Exercise Vital Sign    Days of Exercise per Week: 0 days    Minutes of Exercise per Session: 0 min  Stress: No Stress Concern Present (05/14/2019)   [place]    Feeling of Stress : Not at all  Social Connections: Unknown (05/14/2019)   Social Connection and Isolation Panel [NHANES]    Frequency of Communication with Friends and Family: Patient refused    Frequency of Social Gatherings with Friends and Family: Patient refused    Attends Religious Services: Patient refused    Active Member of  Clubs or Organizations: Patient refused    Attends Archivist Meetings: Patient refused    Marital Status: Patient refused  Intimate Partner Violence: Unknown (05/14/2019)   Humiliation, Afraid, Rape, and Kick questionnaire    Fear of Current or Ex-Partner: Patient refused    Emotionally Abused: Patient refused    Physically Abused: Patient refused    Sexually Abused: Patient refused    Family History  Problem Relation Age of Onset   Heart disease Father    Breast cancer Sister    Cancer Sister        lung   Lung cancer Brother    Cancer Brother        bladder   Lung cancer Sister    Cancer Sister        breast   Kidney disease Brother      Current Outpatient Medications:    acetaminophen (TYLENOL) 325 MG tablet, Take 650 mg by mouth every 6 (six) hours as needed., Disp: , Rfl:    TAGRISSO  80 MG tablet, TAKE ONE TABLET BY MOUTH ONCE DAILY AT THE SAME TIME. MAY TAKE WITH OR WITHOUT FOOD., Disp: 30 tablet, Rfl: 3   triamcinolone ointment (KENALOG) 0.5 %, Apply 1 Application topically 2 (two) times daily., Disp: 30 g, Rfl: 1  Physical exam:  Vitals:   12/14/22 0949  BP: 122/73  Pulse: 86  Resp: 18  Temp: (!) 95.8 F (35.4 C)  TempSrc: Tympanic  SpO2: 100%  Weight: 153 lb 6.4 oz (69.6 kg)  Height: '5\' 9"'$  (1.753 m)   Physical Exam Cardiovascular:     Rate and Rhythm: Normal rate and regular rhythm.     Heart sounds: Normal heart sounds.  Pulmonary:     Effort: Pulmonary effort is normal.     Breath sounds: Normal breath sounds.  Skin:    General: Skin is warm and dry.  Neurological:     Mental Status: He is alert and oriented to person, place, and time.         Latest Ref Rng & Units 12/14/2022    9:36 AM  CMP  Glucose 70 - 99 mg/dL 102   BUN 8 - 23 mg/dL 18   Creatinine 0.61 - 1.24 mg/dL 0.85   Sodium 135 - 145 mmol/L 136   Potassium 3.5 - 5.1 mmol/L 4.1   Chloride 98 - 111 mmol/L 100   CO2 22 - 32 mmol/L 29   Calcium 8.9 - 10.3 mg/dL 9.4    Total Protein 6.5 - 8.1 g/dL 7.2   Total Bilirubin 0.3 - 1.2 mg/dL 1.1   Alkaline Phos 38 - 126 U/L 63   AST 15 - 41 U/L 24   ALT 0 - 44 U/L 17       Latest Ref Rng & Units 12/14/2022    9:36 AM  CBC  WBC 4.0 - 10.5 K/uL 5.0   Hemoglobin 13.0 - 17.0 g/dL 13.6   Hematocrit 39.0 - 52.0 % 41.5   Platelets 150 - 400 K/uL 158     No images are attached to the encounter.  US BIOPSY (LIVER)  Result Date: 11/29/2022 INDICATION: liver lesion, history lung cancer EXAM: Ultrasound-guided core needle biopsy of focal liver lesion MEDICATIONS: None. ANESTHESIA/SEDATION: Moderate (conscious) sedation was employed during this procedure. A total of Versed 1.5 mg and Fentanyl 75 mcg was administered intravenously by the radiology nurse. Total intra-service moderate Sedation Time: 14 minutes. The patient's level of consciousness and vital signs were monitored continuously by radiology nursing throughout the procedure under my direct supervision. COMPLICATIONS: None immediate. PROCEDURE: Informed written consent was obtained from the patient after a thorough discussion of the procedural risks, benefits and alternatives. All questions were addressed. Maximal Sterile Barrier Technique was utilized including caps, mask, sterile gowns, sterile gloves, sterile drape, hand hygiene and skin antiseptic. A timeout was performed prior to the initiation of the procedure. The patient was placed supine on the exam table. Ultrasound of the liver demonstrated a focal 3.5 cm lesion in the right hepatic lobe, consistent with recent CT findings. Skin entry site was marked, and the overlying skin was prepped draped in the standard sterile fashion. Local analgesia was obtained with 1% lidocaine. Using ultrasound guidance, a 17 gauge introducer needle was advanced towards the identified lesion of the right hepatic lobe. Subsequently, core needle biopsy was performed of the focal lesion in the right hepatic lobe using an 18 gauge core  biopsy device x6 total passes, with 4 specimens obtained. Specimens were submitted in formalin to pathology  for further handling. Limited postprocedure imaging demonstrated no hematoma. A clean dressing was placed after manual hemostasis. The patient tolerated the procedure well without immediate complication. IMPRESSION: Successful ultrasound-guided core needle biopsy of focal lesion in the right hepatic lobe. Electronically Signed   By: Albin Felling M.D.   On: 11/29/2022 10:39     Assessment and plan- Patient is a 82 y.o. male with metastatic EGFR positive lung cancer currently on Tagrisso here to discuss biopsy results and further management   CT chest abdomen pelvis with contrast in January 2024 showed right liver lesion measuring 3.3 cm x 2.5 cm.  On prior scans this was obscured by cysts but probably present and measured about 1.6 cm.  This overall indicates progressive disease.  He has stable area of treated right iliac osseous metastases.  There was mixed response to treatment saline and thoracic lymph nodes.  Overall I do think that his treatment needs to be changed given progression of his liver metastases.  I am currently awaiting results of NGS testing to see if he has met amplification and the regular is to use the port and have been addition to [place].  If testing shows no evidence of actionable mutations we will need to switch him to chemotherapy alone or chemoimmunotherapy.  Typically patients with EGFR positive lung cancer do not respond well to immunotherapy and therefore we are probably looking at chemotherapy alone with carboplatin and Alimta.  His liver lesion was biopsied again and was consistent with metastatic adenocarcinoma consistent with lung primary.  Follow-up with me to be decided based on NGS testing results    Visit Diagnosis 1. Goals of care, counseling/discussion   2. Primary cancer of left upper lobe of lung ([place])      Dr. Randa Evens, MD, MPH [facility] [id number] 12/14/2022 3:48 PM

## 2022-12-14 NOTE — Progress Notes (Signed)
Patient states that he has been having right shoulder pain.

## 2022-12-16 ENCOUNTER — Telehealth: Payer: Medicare Other | Admitting: Hospice and Palliative Medicine

## 2022-12-17 ENCOUNTER — Encounter: Payer: Self-pay | Admitting: Oncology

## 2022-12-20 ENCOUNTER — Inpatient Hospital Stay: Payer: Medicare Other | Attending: Hospice and Palliative Medicine | Admitting: Oncology

## 2022-12-20 ENCOUNTER — Encounter: Payer: Self-pay | Admitting: Oncology

## 2022-12-20 VITALS — BP 100/79 | HR 77 | Temp 97.1°F | Resp 18 | Ht 68.0 in | Wt 153.8 lb

## 2022-12-20 DIAGNOSIS — Z7189 Other specified counseling: Secondary | ICD-10-CM | POA: Diagnosis not present

## 2022-12-20 DIAGNOSIS — N486 Induration penis plastica: Secondary | ICD-10-CM | POA: Diagnosis not present

## 2022-12-20 DIAGNOSIS — Z801 Family history of malignant neoplasm of trachea, bronchus and lung: Secondary | ICD-10-CM | POA: Diagnosis not present

## 2022-12-20 DIAGNOSIS — Z7952 Long term (current) use of systemic steroids: Secondary | ICD-10-CM | POA: Diagnosis not present

## 2022-12-20 DIAGNOSIS — G4730 Sleep apnea, unspecified: Secondary | ICD-10-CM | POA: Diagnosis not present

## 2022-12-20 DIAGNOSIS — C3412 Malignant neoplasm of upper lobe, left bronchus or lung: Secondary | ICD-10-CM | POA: Diagnosis not present

## 2022-12-20 DIAGNOSIS — Z803 Family history of malignant neoplasm of breast: Secondary | ICD-10-CM | POA: Diagnosis not present

## 2022-12-20 DIAGNOSIS — Z5111 Encounter for antineoplastic chemotherapy: Secondary | ICD-10-CM | POA: Insufficient documentation

## 2022-12-20 DIAGNOSIS — Z87891 Personal history of nicotine dependence: Secondary | ICD-10-CM | POA: Diagnosis not present

## 2022-12-20 MED ORDER — ONDANSETRON HCL 8 MG PO TABS: 8.0000 mg | ORAL_TABLET | Freq: Three times a day (TID) | ORAL | 1 refills | Status: DC | PRN

## 2022-12-20 MED ORDER — PROCHLORPERAZINE MALEATE 10 MG PO TABS: 10.0000 mg | ORAL_TABLET | Freq: Four times a day (QID) | ORAL | 1 refills | Status: DC | PRN

## 2022-12-20 MED ORDER — DEXAMETHASONE 4 MG PO TABS: ORAL_TABLET | ORAL | 1 refills | Status: DC

## 2022-12-20 MED ORDER — FOLIC ACID 1 MG PO TABS: 1.0000 mg | ORAL_TABLET | Freq: Every day | ORAL | 3 refills | Status: DC

## 2022-12-20 NOTE — Progress Notes (Signed)
Hematology/Oncology Consult note [facility]  [phone] [phone]  Patient Care Team: Birdie Sons, MD as PCP - General (Family Medicine) Rockey Situ Kathlene November, MD as PCP - Cardiology (Cardiology) Franchot Gallo, MD as Consulting Physician (Urology) Telford Nab, RN as Oncology Nurse Navigator   Name of the patient: Jared Mullen  [id number]  Jun 21, 1941   Date of visit: 12/20/22  Diagnosis- metastatic EGFR positive lung cancer    Chief complaint/ Reason for visit-discuss NGS testing results and further management  Heme/Onc history: Patient is a 82 year old male who was admitted to the hospital in January 2023 with symptoms of generalized weakness cough and ongoing weight loss.  CT chest showed left perihilar upper lobe masslike opacity measuring 6.6 x 5 x 6.1 cm contiguous with the hilum.  Left hilar adenopathy as well as paratracheal adenopathy.  Severe narrowing of the left upper lobe bronchus due to left perihilar mass.  CT abdomen and pelvis with contrast showed large lytic lesion involving the right iliac bone.  MRI brain with and without contrast did not show any evidence of metastatic disease.  Patient underwent bronchoscopy with biopsy of the left hilar mass which was consistent with adenocarcinoma.   NGS testing showed EGFR L858R mutation that could be targetable with Tagrisso.  Tumor mutational burden high.  PD-L1 less than 1%.  No other actionable mutations.  Patient has been on Tagrisso since January 2023.  Progression of disease and February 2024 as evidenced by increase in liver metastases as well as mixed response to mediastinal adenopathy.    Interval history-patient had cataract surgery last week and is recovering well from it.  Appetite and weight have remained stable.  He denies any pain presently  ECOG PS- 1 Pain scale- 0 Opioid associated constipation- no  Review of systems- Review of Systems  Constitutional:   Positive for malaise/fatigue. Negative for chills, fever and weight loss.  HENT:  Negative for congestion, ear discharge and nosebleeds.   Eyes:  Negative for blurred vision.  Respiratory:  Negative for cough, hemoptysis, sputum production, shortness of breath and wheezing.   Cardiovascular:  Negative for chest pain, palpitations, orthopnea and claudication.  Gastrointestinal:  Negative for abdominal pain, blood in stool, constipation, diarrhea, heartburn, melena, nausea and vomiting.  Genitourinary:  Negative for dysuria, flank pain, frequency, hematuria and urgency.  Musculoskeletal:  Negative for back pain, joint pain and myalgias.  Skin:  Negative for rash.  Neurological:  Negative for dizziness, tingling, focal weakness, seizures, weakness and headaches.  Endo/Heme/Allergies:  Does not bruise/bleed easily.  Psychiatric/Behavioral:  Negative for depression and suicidal ideas. The patient does not have insomnia.       No Known Allergies   Past Medical History:  Diagnosis Date   Abnormal ECG    a. baseline inferolateral ST elevation dating back to at least 2012.   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   Sleep apnea    Urinary frequency    Weak urinary stream    Weight loss      Past Surgical History:  Procedure Laterality Date   biospy  09/30/2011-   TRUS/Bx Prostate - 5/12/biopsies positive for cancer, glandular size 32.5 cc   CHOLECYSTECTOMY  1992   RADIOACTIVE SEED IMPLANT  02/09/2012   Procedure: RADIOACTIVE SEED IMPLANT;  Surgeon: Annie Main  Dahlstedt, MD;  Location: [facility];  Service: Urology;  Laterality: N/A;  RAD TECH OK PER ANNE AT MAIN OR    UVULOPALATOPHARYNGOPLASTY     VIDEO BRONCHOSCOPY WITH ENDOBRONCHIAL ULTRASOUND Bilateral 11/12/2021   Procedure: VIDEO BRONCHOSCOPY WITH ENDOBRONCHIAL ULTRASOUND;  Surgeon:  Ottie Glazier, MD;  Location: [facility];  Service: Thoracic;  Laterality: Bilateral;    Social History   Socioeconomic History   Marital status: Widowed    Spouse name: Not on file   Number of children: 3   Years of education: Not on file   Highest education level: High school graduate  Occupational History   Occupation: retired  Tobacco Use   Smoking status: Former    Packs/day: 2.00    Years: 28.00    Total pack years: 56.00    Types: Cigarettes    Quit date: 10/18/1980    Years since quitting: 42.2   Smokeless tobacco: Never  Vaping Use   Vaping Use: Never used  Substance and Sexual Activity   Alcohol use: No   Drug use: No   Sexual activity: Not on file    Comment: Low libido  Other Topics Concern   Not on file  Social History Narrative   Lives locally.  Was caring for his wife until she passed in December.  Sedentary.   Social Determinants of Health   Financial Resource Strain: Low Risk  (05/14/2019)   Overall Financial Resource Strain (CARDIA)    Difficulty of Paying Living Expenses: Not hard at all  Food Insecurity: No Food Insecurity (05/14/2019)   Hunger Vital Sign    Worried About Running Out of Food in the Last Year: Never true    Ran Out of Food in the Last Year: Never true  Transportation Needs: No Transportation Needs (05/14/2019)   PRAPARE - Hydrologist (Medical): No    Lack of Transportation (Non-Medical): No  Physical Activity: Inactive (05/14/2019)   Exercise Vital Sign    Days of Exercise per Week: 0 days    Minutes of Exercise per Session: 0 min  Stress: No Stress Concern Present (05/14/2019)   [place]    Feeling of Stress : Not at all  Social Connections: Unknown (05/14/2019)   Social Connection and Isolation Panel [NHANES]    Frequency of Communication with Friends and Family: Patient refused    Frequency of Social Gatherings with Friends and  Family: Patient refused    Attends Religious Services: Patient refused    Active Member of Clubs or Organizations: Patient refused    Attends Archivist Meetings: Patient refused    Marital Status: Patient refused  Intimate Partner Violence: Unknown (05/14/2019)   Humiliation, Afraid, Rape, and Kick questionnaire    Fear of Current or Ex-Partner: Patient refused    Emotionally Abused: Patient refused    Physically Abused: Patient refused    Sexually Abused: Patient refused    Family History  Problem Relation Age of Onset   Heart disease Father    Breast cancer Sister    Cancer Sister        lung   Lung cancer Brother    Cancer Brother        bladder   Lung cancer Sister    Cancer Sister        breast   Kidney disease Brother      Current Outpatient Medications:    acetaminophen (TYLENOL) 325 MG tablet,  Take 650 mg by mouth every 6 (six) hours as needed., Disp: , Rfl:    erythromycin ophthalmic ointment, APPLY THIN RIBBON IN LEFT EYE TWO TIMES A DAY DAILY AND NIGHTLY IN THE LEFT EYE., Disp: , Rfl:    TAGRISSO 80 MG tablet, TAKE ONE TABLET BY MOUTH ONCE DAILY AT THE SAME TIME. MAY TAKE WITH OR WITHOUT FOOD., Disp: 30 tablet, Rfl: 3   triamcinolone ointment (KENALOG) 0.5 %, Apply 1 Application topically 2 (two) times daily., Disp: 30 g, Rfl: 1   dexamethasone (DECADRON) 4 MG tablet, Take 1 tab 2 times daily starting day before pemetrexed. Then take 2 tabs daily x 3 days starting day after carboplatin. Take with food., Disp: 30 tablet, Rfl: 1   folic acid (FOLVITE) 1 MG tablet, Take 1 tablet (1 mg total) by mouth daily. Start 7 days before pemetrexed chemotherapy. Continue until 21 days after pemetrexed completed., Disp: 100 tablet, Rfl: 3   ondansetron (ZOFRAN) 8 MG tablet, Take 1 tablet (8 mg total) by mouth every 8 (eight) hours as needed for nausea or vomiting. Start on the third day after carboplatin., Disp: 30 tablet, Rfl: 1   prochlorperazine (COMPAZINE) 10 MG tablet,  Take 1 tablet (10 mg total) by mouth every 6 (six) hours as needed for nausea or vomiting., Disp: 30 tablet, Rfl: 1  Physical exam:  Vitals:   12/20/22 1501  BP: 100/79  Pulse: 77  Resp: 18  Temp: (!) 97.1 F (36.2 C)  TempSrc: Tympanic  SpO2: 100%  Weight: 153 lb 12.8 oz (69.8 kg)  Height: '5\' 8"'$  (1.727 m)   Physical Exam Cardiovascular:     Rate and Rhythm: Normal rate and regular rhythm.     Heart sounds: Normal heart sounds.  Pulmonary:     Effort: Pulmonary effort is normal.     Breath sounds: Normal breath sounds.  Abdominal:     General: Bowel sounds are normal.     Palpations: Abdomen is soft.  Skin:    General: Skin is warm and dry.  Neurological:     Mental Status: He is alert and oriented to person, place, and time.         Latest Ref Rng & Units 12/14/2022    9:36 AM  CMP  Glucose 70 - 99 mg/dL 102   BUN 8 - 23 mg/dL 18   Creatinine 0.61 - 1.24 mg/dL 0.85   Sodium 135 - 145 mmol/L 136   Potassium 3.5 - 5.1 mmol/L 4.1   Chloride 98 - 111 mmol/L 100   CO2 22 - 32 mmol/L 29   Calcium 8.9 - 10.3 mg/dL 9.4   Total Protein 6.5 - 8.1 g/dL 7.2   Total Bilirubin 0.3 - 1.2 mg/dL 1.1   Alkaline Phos 38 - 126 U/L 63   AST 15 - 41 U/L 24   ALT 0 - 44 U/L 17       Latest Ref Rng & Units 12/14/2022    9:36 AM  CBC  WBC 4.0 - 10.5 K/uL 5.0   Hemoglobin 13.0 - 17.0 g/dL 13.6   Hematocrit 39.0 - 52.0 % 41.5   Platelets 150 - 400 K/uL 158       US BIOPSY (LIVER)  Result Date: 11/29/2022 INDICATION: liver lesion, history lung cancer EXAM: Ultrasound-guided core needle biopsy of focal liver lesion MEDICATIONS: None. ANESTHESIA/SEDATION: Moderate (conscious) sedation was employed during this procedure. A total of Versed 1.5 mg and Fentanyl 75 mcg was administered intravenously by the radiology  nurse. Total intra-service moderate Sedation Time: 14 minutes. The patient's level of consciousness and vital signs were monitored continuously by radiology nursing throughout  the procedure under my direct supervision. COMPLICATIONS: None immediate. PROCEDURE: Informed written consent was obtained from the patient after a thorough discussion of the procedural risks, benefits and alternatives. All questions were addressed. Maximal Sterile Barrier Technique was utilized including caps, mask, sterile gowns, sterile gloves, sterile drape, hand hygiene and skin antiseptic. A timeout was performed prior to the initiation of the procedure. The patient was placed supine on the exam table. Ultrasound of the liver demonstrated a focal 3.5 cm lesion in the right hepatic lobe, consistent with recent CT findings. Skin entry site was marked, and the overlying skin was prepped draped in the standard sterile fashion. Local analgesia was obtained with 1% lidocaine. Using ultrasound guidance, a 17 gauge introducer needle was advanced towards the identified lesion of the right hepatic lobe. Subsequently, core needle biopsy was performed of the focal lesion in the right hepatic lobe using an 18 gauge core biopsy device x6 total passes, with 4 specimens obtained. Specimens were submitted in formalin to pathology for further handling. Limited postprocedure imaging demonstrated no hematoma. A clean dressing was placed after manual hemostasis. The patient tolerated the procedure well without immediate complication. IMPRESSION: Successful ultrasound-guided core needle biopsy of focal lesion in the right hepatic lobe. Electronically Signed   By: Albin Felling M.D.   On: 11/29/2022 10:39     Assessment and plan- Patient is a 82 y.o. male with metastatic EGFR positive lung cancer with progression on Tagrisso here to discuss further management  Discussed results of NGS testing which did not show any actionable mutation after progression on Tagrisso.  He does not have any met skipping mutations which will allow [place] to use tepotinib.  There are no other actionable targetable mutations either.  Usually patients  with EGFR positive lung cancer do not have a good response to immunotherapy.  His PD-L1 is less than 1% and although his tumor mutational burden is high the chances that he will respond to immunotherapy are low.  We discussed proceeding with best supportive care versus attempting chemotherapy.  Patient feels well overall and wishes to try chemotherapy.  I would recommend combination chemotherapy with carboplatin AUC 4 along with 500 mg/m IV given every 3 weeks for 4 treatments.  Following that if he has good response to treatment/stable disease we will proceed with single agent Alimta until progression or toxicity.  He understands that the goal of chemotherapy is to maintain/improve his quality of life.  That chemotherapy would be associated with potential side effects such as nausea vomiting low blood counts, risk of infections and hospitalization.  He understands and is agreeable to proceed as planned.  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  He does not have to see covering provider on that day.  Patient will be seen by NP Altha Harm in 2 weeks to see how he has tolerated cycle 1 of chemotherapy and also to introduce the concept of palliative care and eventually goals of care discussion.  I will tentatively see him 4 weeks from today for cycle 2 of treatment.   Visit Diagnosis 1. Primary cancer of left upper lobe of lung ([place])   2. Goals of care, counseling/discussion      Dr. Randa Evens, MD, MPH [facility] [id number] 12/20/2022 4:10 PM

## 2022-12-20 NOTE — Progress Notes (Signed)
START ON PATHWAY REGIMEN - Non-Small Cell Lung     A cycle is every 21 days:     Pemetrexed      Carboplatin   **Always confirm dose/schedule in your pharmacy ordering system**  Patient Characteristics: Stage IV Metastatic, Nonsquamous, Molecular Analysis Completed, Molecular Alteration Present and Targeted Therapy Exhausted OR EGFR Exon 20+ or KRAS G12C+ or HER2+ Present and No Prior Chemo/Immunotherapy OR No Alteration Present, Initial  Chemotherapy/Immunotherapy, PS = 0, 1, No Alteration Present, No Alteration Present, Not a Candidate for Immunotherapy Therapeutic Status: Stage IV Metastatic Histology: Nonsquamous Cell Broad Molecular Profiling Status: Engineer, manufacturing Analysis Results: No Alteration Present ECOG Performance Status: 1 Chemotherapy/Immunotherapy Line of Therapy: Initial Chemotherapy/Immunotherapy EGFR Exons 18-21 Mutation Testing Status: Completed and Negative ALK Fusion/Rearrangement Testing Status: Completed and Negative BRAF V600 Mutation Testing Status: Completed and Negative KRAS G12C Mutation Testing Status: Completed and Negative MET Exon 14 Mutation Testing Status: Completed and Negative RET Fusion/Rearrangement Testing Status: Completed and Negative HER2 Mutation Testing Status: Completed and Negative NTRK Fusion/Rearrangement Testing Status: Completed and Negative ROS1 Fusion/Rearrangement Testing Status: Completed and Negative Immunotherapy Candidate Status: Not a Candidate for Immunotherapy Intent of Therapy: Non-Curative / Palliative Intent, Discussed with Patient

## 2022-12-20 NOTE — Progress Notes (Signed)
Patient states that he is using eye drops that have a bitter taste and is causing tightness in his chest, he is waiting to hear from dermatology about spot on his forehead.

## 2022-12-21 ENCOUNTER — Telehealth: Payer: Self-pay | Admitting: *Deleted

## 2022-12-21 ENCOUNTER — Encounter: Payer: Self-pay | Admitting: *Deleted

## 2022-12-21 ENCOUNTER — Other Ambulatory Visit: Payer: Self-pay | Admitting: *Deleted

## 2022-12-21 DIAGNOSIS — C3412 Malignant neoplasm of upper lobe, left bronchus or lung: Secondary | ICD-10-CM

## 2022-12-21 NOTE — Progress Notes (Signed)
Patient for IR Port Insertion on Wed 12/22/2022, I called and spoke with the patient on the phone and gave pre-procedure instructions. Pt was made aware to be here at 1:30p, NPO after MN prior to procedure as well as driver post procedure/recovery/discharge. Pt stated understanding. Called 12/21/2022

## 2022-12-21 NOTE — Telephone Encounter (Signed)
I called the pt. And then his phone rang several times and then it said enter remote number in which I do not have and then I called his daughter [name] and said that he can get port in on wed 3/6 arrival at 1:30. He will need someone to take him to the procedure and back home. He will need to be stop eating and drinking 8 hours before his procedure. That means stop at 5:30 am for eating and drinking. Daughter will let him know. Also will send my chart too.

## 2022-12-22 ENCOUNTER — Other Ambulatory Visit: Payer: Self-pay | Admitting: Student

## 2022-12-22 ENCOUNTER — Ambulatory Visit
Admission: RE | Admit: 2022-12-22 | Discharge: 2022-12-22 | Disposition: A | Discharge status: Home / Self Care | Payer: Medicare Other | Source: Ambulatory Visit | Attending: Oncology | Admitting: Oncology

## 2022-12-22 ENCOUNTER — Encounter: Payer: Medicare Other | Admitting: Family Medicine

## 2022-12-22 DIAGNOSIS — Z87891 Personal history of nicotine dependence: Secondary | ICD-10-CM | POA: Insufficient documentation

## 2022-12-22 DIAGNOSIS — Z8546 Personal history of malignant neoplasm of prostate: Secondary | ICD-10-CM | POA: Insufficient documentation

## 2022-12-22 DIAGNOSIS — G4730 Sleep apnea, unspecified: Secondary | ICD-10-CM | POA: Diagnosis not present

## 2022-12-22 DIAGNOSIS — C3412 Malignant neoplasm of upper lobe, left bronchus or lung: Secondary | ICD-10-CM | POA: Insufficient documentation

## 2022-12-22 HISTORY — PX: IR IMAGING GUIDED PORT INSERTION: IMG5740

## 2022-12-22 MED ORDER — HEPARIN SOD (PORK) LOCK FLUSH 100 UNIT/ML IV SOLN
INTRAVENOUS | Status: AC
  Administered 2022-12-22: 500 [IU]
  Filled 2022-12-22: qty 5

## 2022-12-22 MED ORDER — FENTANYL CITRATE (PF) 100 MCG/2ML IJ SOLN
INTRAMUSCULAR | Status: AC | PRN
  Administered 2022-12-22: 25 ug via INTRAVENOUS
  Administered 2022-12-22: 50 ug via INTRAVENOUS

## 2022-12-22 MED ORDER — MIDAZOLAM HCL 2 MG/2ML IJ SOLN
INTRAMUSCULAR | Status: AC
  Filled 2022-12-22: qty 2

## 2022-12-22 MED ORDER — FENTANYL CITRATE (PF) 100 MCG/2ML IJ SOLN
INTRAMUSCULAR | Status: AC
  Filled 2022-12-22: qty 2

## 2022-12-22 MED ORDER — LIDOCAINE-EPINEPHRINE 1 %-1:100000 IJ SOLN
INTRAMUSCULAR | Status: AC
  Filled 2022-12-22: qty 1

## 2022-12-22 MED ORDER — MIDAZOLAM HCL 2 MG/2ML IJ SOLN
INTRAMUSCULAR | Status: AC | PRN
  Administered 2022-12-22: 1 mg via INTRAVENOUS

## 2022-12-22 MED ORDER — LIDOCAINE HCL 1 % IJ SOLN
INTRAMUSCULAR | Status: AC
  Administered 2022-12-22: 10 mL
  Filled 2022-12-22: qty 20

## 2022-12-22 MED ORDER — MIDAZOLAM HCL 5 MG/5ML IJ SOLN
INTRAMUSCULAR | Status: AC | PRN
  Administered 2022-12-22: .5 mg via INTRAVENOUS

## 2022-12-22 MED ORDER — SODIUM CHLORIDE 0.9 % IV SOLN: INTRAVENOUS | Status: DC

## 2022-12-22 NOTE — Procedures (Signed)
Interventional Radiology Procedure Note  Procedure: Single Lumen Power Port Placement    Access:  Right IJ vein.  Findings: Catheter tip positioned at SVC/RA junction. Port is ready for immediate use.   Complications: None  EBL: < 10 mL  Recommendations:  - Ok to shower in 24 hours - Do not submerge for 7 days - Routine line care   Neilani Duffee T. Evanie Buckle, M.D Pager:  [phone]   

## 2022-12-22 NOTE — H&P (Addendum)
Chief Complaint: Patient was seen in consultation today for image-guided port placement  Referring Physician(s): [place] [facility]  Supervising Physician: Aletta Edouard  Patient Status: [facility] - Out-pt  History of Present Illness: Jared Mullen is a 82 y.o. male with PMH significant for lung cancer, prostate cancer, and sleep apnea being seen today for image-guided port placement. Patient is known to IR from image-guided liver biopsy performed 11/29/22. This liver biopsy was positive for malignancy and consistent with a primary pulmonary malignancy. The patient is followed by Dr Janese Banks from Oncology for his lung cancer, and has been referred to IR for image-guided port placement.   Past Medical History:  Diagnosis Date   Abnormal ECG    a. baseline inferolateral ST elevation dating back to at least 2012.   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   Sleep apnea    Urinary frequency    Weak urinary stream    Weight loss     Past Surgical History:  Procedure Laterality Date   biospy  09/30/2011-   TRUS/Bx Prostate - 5/12/biopsies positive for cancer, glandular size 32.5 cc   CHOLECYSTECTOMY  1992   RADIOACTIVE SEED IMPLANT  02/09/2012   Procedure: RADIOACTIVE SEED IMPLANT;  Surgeon: Franchot Gallo, MD;  Location: [facility];  Service: Urology;  Laterality: N/A;  RAD TECH OK PER ANNE AT MAIN OR    UVULOPALATOPHARYNGOPLASTY     VIDEO BRONCHOSCOPY WITH ENDOBRONCHIAL ULTRASOUND Bilateral 11/12/2021   Procedure: VIDEO BRONCHOSCOPY WITH ENDOBRONCHIAL ULTRASOUND;  Surgeon: Ottie Glazier, MD;  Location: [facility];  Service: Thoracic;  Laterality: Bilateral;    Allergies: Patient has no known allergies.  Medications: Prior to Admission medications   Medication Sig Start Date End Date Taking? Authorizing Provider  TAGRISSO 80 MG tablet  TAKE ONE TABLET BY MOUTH ONCE DAILY AT THE SAME TIME. MAY TAKE WITH OR WITHOUT FOOD. 10/29/22  Yes Sindy Guadeloupe, MD  acetaminophen (TYLENOL) 325 MG tablet Take 650 mg by mouth every 6 (six) hours as needed.    [provider]  triamcinolone ointment (KENALOG) 0.5 % Apply 1 Application topically 2 (two) times daily. 09/02/22   Borders, Kirt Boys, NP     Family History  Problem Relation Age of Onset   Heart disease Father    Breast cancer Sister    Cancer Sister        lung   Lung cancer Brother    Cancer Brother        bladder   Lung cancer Sister    Cancer Sister        breast   Kidney disease Brother     Social History   Socioeconomic History   Marital status: Widowed    Spouse name: Not on file   Number of children: 3   Years of education: Not on file   Highest education level: High school graduate  Occupational History   Occupation: retired  Tobacco Use   Smoking status: Former    Packs/day: 2.00    Years: 28.00    Total pack years: 56.00    Types: Cigarettes    Quit date: 10/18/1980    Years since quitting: 42.2   Smokeless tobacco: Never  Vaping Use   Vaping Use: Never used  Substance and Sexual Activity   Alcohol use: No  Drug use: No   Sexual activity: Not on file    Comment: Low libido  Other Topics Concern   Not on file  Social History Narrative   Lives locally.  Was caring for his wife until she passed in December.  Sedentary.   Social Determinants of Health   Financial Resource Strain: Low Risk  (05/14/2019)   Overall Financial Resource Strain (CARDIA)    Difficulty of Paying Living Expenses: Not hard at all  Food Insecurity: No Food Insecurity (05/14/2019)   Hunger Vital Sign    Worried About Running Out of Food in the Last Year: Never true    Ran Out of Food in the Last Year: Never true  Transportation Needs: No Transportation Needs (05/14/2019)   PRAPARE - Hydrologist (Medical): No    Lack of Transportation  (Non-Medical): No  Physical Activity: Inactive (05/14/2019)   Exercise Vital Sign    Days of Exercise per Week: 0 days    Minutes of Exercise per Session: 0 min  Stress: No Stress Concern Present (05/14/2019)   [place]    Feeling of Stress : Not at all  Social Connections: Unknown (05/14/2019)   Social Connection and Isolation Panel [NHANES]    Frequency of Communication with Friends and Family: Patient refused    Frequency of Social Gatherings with Friends and Family: Patient refused    Attends Religious Services: Patient refused    Marine scientist or Organizations: Patient refused    Attends Archivist Meetings: Patient refused    Marital Status: Patient refused   Code Status: Full Code  Review of Systems: A 12 point ROS discussed and pertinent positives are indicated in the HPI above.  All other systems are negative.  Review of Systems  Constitutional:  Negative for chills and fever.  Respiratory:  Positive for chest tightness and shortness of breath.   Cardiovascular:  Negative for chest pain and leg swelling.  Gastrointestinal:  Negative for abdominal pain, diarrhea, nausea and vomiting.  Neurological:  Negative for dizziness and headaches.  Psychiatric/Behavioral:  Negative for confusion.     Vital Signs: BP 124/69 (BP Location: Left Arm)   Pulse 68   Temp 97.7 F (36.5 C)   Resp 16   Ht '5\' 9"'$  (1.753 m)   Wt 153 lb 14.1 oz (69.8 kg)   SpO2 100%   BMI 22.72 kg/m    Physical Exam Vitals reviewed.  Constitutional:      General: He is not in acute distress. HENT:     Mouth/Throat:     Mouth: Mucous membranes are moist.  Eyes:     Pupils: Pupils are equal, round, and reactive to light.  Cardiovascular:     Rate and Rhythm: Normal rate and regular rhythm.     Pulses: Normal pulses.     Heart sounds: Normal heart sounds.  Pulmonary:     Effort: Pulmonary effort is normal.      Breath sounds: Normal breath sounds.  Abdominal:     General: Bowel sounds are normal.     Palpations: Abdomen is soft.     Tenderness: There is no abdominal tenderness.  Musculoskeletal:     Right lower leg: No edema.     Left lower leg: No edema.  Skin:    General: Skin is warm and dry.  Neurological:     Mental Status: He is alert and oriented to person, place,  and time.  Psychiatric:        Mood and Affect: Mood normal.        Behavior: Behavior normal.        Thought Content: Thought content normal.        Judgment: Judgment normal.     Imaging: US BIOPSY (LIVER)  Result Date: 11/29/2022 INDICATION: liver lesion, history lung cancer EXAM: Ultrasound-guided core needle biopsy of focal liver lesion MEDICATIONS: None. ANESTHESIA/SEDATION: Moderate (conscious) sedation was employed during this procedure. A total of Versed 1.5 mg and Fentanyl 75 mcg was administered intravenously by the radiology nurse. Total intra-service moderate Sedation Time: 14 minutes. The patient's level of consciousness and vital signs were monitored continuously by radiology nursing throughout the procedure under my direct supervision. COMPLICATIONS: None immediate. PROCEDURE: Informed written consent was obtained from the patient after a thorough discussion of the procedural risks, benefits and alternatives. All questions were addressed. Maximal Sterile Barrier Technique was utilized including caps, mask, sterile gowns, sterile gloves, sterile drape, hand hygiene and skin antiseptic. A timeout was performed prior to the initiation of the procedure. The patient was placed supine on the exam table. Ultrasound of the liver demonstrated a focal 3.5 cm lesion in the right hepatic lobe, consistent with recent CT findings. Skin entry site was marked, and the overlying skin was prepped draped in the standard sterile fashion. Local analgesia was obtained with 1% lidocaine. Using ultrasound guidance, a 17 gauge introducer  needle was advanced towards the identified lesion of the right hepatic lobe. Subsequently, core needle biopsy was performed of the focal lesion in the right hepatic lobe using an 18 gauge core biopsy device x6 total passes, with 4 specimens obtained. Specimens were submitted in formalin to pathology for further handling. Limited postprocedure imaging demonstrated no hematoma. A clean dressing was placed after manual hemostasis. The patient tolerated the procedure well without immediate complication. IMPRESSION: Successful ultrasound-guided core needle biopsy of focal lesion in the right hepatic lobe. Electronically Signed   By: Albin Felling M.D.   On: 11/29/2022 10:39    Labs:  CBC: Recent Labs    08/24/22 1348 11/12/22 1306 11/29/22 0803 12/14/22 0936  WBC 4.7 3.9* 5.1 5.0  HGB 12.6* 13.0 12.6* 13.6  HCT 38.4* 39.5 38.7* 41.5  PLT 155 133* 159 158     COAGS: Recent Labs    11/29/22 0803  INR 1.1     BMP: Recent Labs    06/23/22 0930 08/24/22 1348 11/08/22 1001 11/12/22 1306 12/14/22 0936  NA 137 138  --  136 136  K 4.1 4.3  --  4.2 4.1  CL 102 100  --  98 100  CO2 30 27  --  28 29  GLUCOSE 111* 105*  --  107* 102*  BUN 23 28*  --  19 18  CALCIUM 9.6 9.8  --  9.3 9.4  CREATININE 0.97 0.84 0.70 0.78 0.85  GFRNONAA >60 >60  --  >60 >60     LIVER FUNCTION TESTS: Recent Labs    06/23/22 0930 08/24/22 1348 11/12/22 1306 12/14/22 0936  BILITOT 1.3* 0.9 1.5* 1.1  AST '22 25 24 24  '$ ALT '16 20 20 17  '$ ALKPHOS 49 54 61 63  PROT 7.2 7.4 7.3 7.2  ALBUMIN 3.9 4.0 3.8 3.7     TUMOR MARKERS: No results for input(s): "AFPTM", "CEA", "CA199", "CHROMGRNA" in the last 8760 hours.  Assessment and Plan:   Jamarion Rieker is an 82 yo male with lung cancer  being seen today for image-guided port placement. The patient is followed by Dr Janese Banks from Oncology, and he is set to begin chemotherapy soon for treatment of his lung cancer. Case has been reviewed with Dr Kathlene Cote and is set  to proceed on 12/22/22 with moderate sedation.  Risks and benefits of image guided port-a-catheter placement was discussed with the patient including, but not limited to bleeding, infection, pneumothorax, or fibrin sheath development and need for additional procedures.  All of the patient's questions were answered, patient is agreeable to proceed. Consent signed and in chart.     Thank you for this interesting consult.  I greatly enjoyed meeting Jared Mullen and look forward to participating in their care.  A copy of this report was sent to the requesting provider on this date.  Electronically Signed: Lura Em, PA-C 12/22/2022, 3:15 PM   I spent a total of  30 Minutes   in face to face in clinical consultation, greater than 50% of which was counseling/coordinating care for image-guided port placement.

## 2022-12-23 ENCOUNTER — Inpatient Hospital Stay: Payer: Medicare Other

## 2022-12-24 ENCOUNTER — Telehealth: Payer: Self-pay | Admitting: *Deleted

## 2022-12-24 ENCOUNTER — Other Ambulatory Visit: Payer: No Typology Code available for payment source

## 2022-12-24 NOTE — Telephone Encounter (Signed)
Called back to daughter and left message if he started it yest. And he will get chemo 3/13 it is good enough. He will still get his chemo

## 2022-12-27 ENCOUNTER — Inpatient Hospital Stay: Payer: Medicare Other

## 2022-12-27 ENCOUNTER — Telehealth: Payer: Self-pay | Admitting: *Deleted

## 2022-12-27 DIAGNOSIS — Z95828 Presence of other vascular implants and grafts: Secondary | ICD-10-CM

## 2022-12-27 DIAGNOSIS — Z5111 Encounter for antineoplastic chemotherapy: Secondary | ICD-10-CM | POA: Diagnosis not present

## 2022-12-27 DIAGNOSIS — C3412 Malignant neoplasm of upper lobe, left bronchus or lung: Secondary | ICD-10-CM

## 2022-12-27 MED ORDER — LIDOCAINE-PRILOCAINE 2.5-2.5 % EX CREA: 1.0000 | TOPICAL_CREAM | CUTANEOUS | 3 refills | Status: DC | PRN

## 2022-12-27 MED ORDER — CYANOCOBALAMIN 1000 MCG/ML IJ SOLN
1000.0000 ug | Freq: Once | INTRAMUSCULAR | Status: AC
  Administered 2022-12-27: 1000 ug via INTRAMUSCULAR
  Filled 2022-12-27: qty 1

## 2022-12-27 NOTE — Telephone Encounter (Signed)
Patient came for his b12 injection today prior to new start Alimta/carbo. Upon arrival, pt and his daughter had several follow-up post chemotherapy education questions.  I reviewed his premed schedule for the dexamethasone and schedule for antiemetics. Patient/family provided with verbal and written instructions on dexamethasone. Pt instructed to take 2 tablets the day before his chemotherapy. I instructed pt not to take any steroids on the day of his treatment. Patient instructed to take 2 tabs daily for 3 days after his chemotherapy treatment. Pt instructed to take the dexamethasone with food.  I confirmed with patient that he was currently taking the folic acid.  I reviewed that he needs to take the zofran starting on day 3 after his chemotherapy to prevent chemo-induced N&V.   Patient reports some tenderness to port a cath. He wanted to know if he could take any tylenol to help with tenderness to port a cath. I told the patient that he can take the tylenol extra strength if needed for pain. Overtime, the pain at the port site should ease up. Pt stated that he was told the leave the port dressing on until the day of treatment. He didn't understand how to apply the numbing cream to the port if he has a dressing on the port. I told the patient that the dressing from the port could now be removed. Pt given verbal instructions on how to apply the numbing cream to the port a cath prior to nurse assessing.   I reviewed pt's home medication list with patient/family. Pt thought the kenalog ointment was the 'cream' to apply on the port a cath. I told the patient that this cream is only to be used if he has a rash. This medication was prescribed in Nov when he had the rash induced by the [place].  I discovered that no script for the emla cream had been sent to patient's pharmacy at this time. Emla cream script sent to patient's pharmacy. Pt gave verbal understanding/teach back process performed with patient  and daughter. They thanked me for the reinforcement of chemo education and understand they can contact us at anytime with further questions or concerns.

## 2022-12-28 MED FILL — Dexamethasone Sodium Phosphate Inj 100 MG/10ML: INTRAMUSCULAR | Qty: 1 | Status: AC

## 2022-12-28 MED FILL — Fosaprepitant Dimeglumine For IV Infusion 150 MG (Base Eq): INTRAVENOUS | Qty: 5 | Status: AC

## 2022-12-29 ENCOUNTER — Inpatient Hospital Stay: Payer: Medicare Other

## 2022-12-29 ENCOUNTER — Encounter: Payer: Medicare Other | Admitting: Family Medicine

## 2022-12-29 VITALS — BP 116/70 | HR 78 | Resp 16 | Wt 143.8 lb

## 2022-12-29 DIAGNOSIS — C3412 Malignant neoplasm of upper lobe, left bronchus or lung: Secondary | ICD-10-CM

## 2022-12-29 DIAGNOSIS — Z5111 Encounter for antineoplastic chemotherapy: Secondary | ICD-10-CM | POA: Diagnosis not present

## 2022-12-29 LAB — CBC WITH DIFFERENTIAL (CANCER CENTER ONLY)
Abs Immature Granulocytes: 0.02 10*3/uL (ref 0.00–0.07)
Basophils Absolute: 0 10*3/uL (ref 0.0–0.1)
Basophils Relative: 0 %
Eosinophils Absolute: 0.2 10*3/uL (ref 0.0–0.5)
Eosinophils Relative: 4 %
HCT: 38.7 % — ABNORMAL LOW (ref 39.0–52.0)
Hemoglobin: 12.9 g/dL — ABNORMAL LOW (ref 13.0–17.0)
Immature Granulocytes: 0 %
Lymphocytes Relative: 9 %
Lymphs Abs: 0.5 10*3/uL — ABNORMAL LOW (ref 0.7–4.0)
MCH: 31 pg (ref 26.0–34.0)
MCHC: 33.3 g/dL (ref 30.0–36.0)
MCV: 93 fL (ref 80.0–100.0)
Monocytes Absolute: 0.7 10*3/uL (ref 0.1–1.0)
Monocytes Relative: 12 %
Neutro Abs: 4.3 10*3/uL (ref 1.7–7.7)
Neutrophils Relative %: 75 %
Platelet Count: 151 10*3/uL (ref 150–400)
RBC: 4.16 MIL/uL — ABNORMAL LOW (ref 4.22–5.81)
RDW: 13.3 % (ref 11.5–15.5)
WBC Count: 5.8 10*3/uL (ref 4.0–10.5)
nRBC: 0 % (ref 0.0–0.2)

## 2022-12-29 LAB — CMP (CANCER CENTER ONLY)
ALT: 18 U/L (ref 0–44)
AST: 27 U/L (ref 15–41)
Albumin: 3.8 g/dL (ref 3.5–5.0)
Alkaline Phosphatase: 69 U/L (ref 38–126)
Anion gap: 8 (ref 5–15)
BUN: 21 mg/dL (ref 8–23)
CO2: 28 mmol/L (ref 22–32)
Calcium: 9.3 mg/dL (ref 8.9–10.3)
Chloride: 101 mmol/L (ref 98–111)
Creatinine: 0.9 mg/dL (ref 0.61–1.24)
GFR, Estimated: >60 mL/min (ref >60)
Glucose, Bld: 127 mg/dL — ABNORMAL HIGH (ref 70–99)
Potassium: 3.7 mmol/L (ref 3.5–5.1)
Sodium: 137 mmol/L (ref 135–145)
Total Bilirubin: 1 mg/dL (ref 0.3–1.2)
Total Protein: 7.2 g/dL (ref 6.5–8.1)

## 2022-12-29 MED ORDER — SODIUM CHLORIDE 0.9 % IV SOLN
10.0000 mg | Freq: Once | INTRAVENOUS | Status: AC
  Administered 2022-12-29: 10 mg via INTRAVENOUS
  Filled 2022-12-29: qty 10

## 2022-12-29 MED ORDER — SODIUM CHLORIDE 0.9 % IV SOLN
150.0000 mg | Freq: Once | INTRAVENOUS | Status: AC
  Administered 2022-12-29: 150 mg via INTRAVENOUS
  Filled 2022-12-29: qty 150

## 2022-12-29 MED ORDER — SODIUM CHLORIDE 0.9 % IV SOLN
317.2000 mg | Freq: Once | INTRAVENOUS | Status: AC
  Administered 2022-12-29: 320 mg via INTRAVENOUS
  Filled 2022-12-29: qty 32

## 2022-12-29 MED ORDER — SODIUM CHLORIDE 0.9 % IV SOLN
Freq: Once | INTRAVENOUS | Status: AC
  Filled 2022-12-29: qty 250

## 2022-12-29 MED ORDER — HEPARIN SOD (PORK) LOCK FLUSH 100 UNIT/ML IV SOLN
500.0000 [IU] | Freq: Once | INTRAVENOUS | Status: AC | PRN
  Administered 2022-12-29: 500 [IU]
  Filled 2022-12-29: qty 5

## 2022-12-29 MED ORDER — SODIUM CHLORIDE 0.9 % IV SOLN
500.0000 mg/m2 | Freq: Once | INTRAVENOUS | Status: AC
  Administered 2022-12-29: 900 mg via INTRAVENOUS
  Filled 2022-12-29: qty 20

## 2022-12-29 MED ORDER — PALONOSETRON HCL INJECTION 0.25 MG/5ML
0.2500 mg | Freq: Once | INTRAVENOUS | Status: AC
  Administered 2022-12-29: 0.25 mg via INTRAVENOUS
  Filled 2022-12-29: qty 5

## 2022-12-29 NOTE — Patient Instructions (Signed)
[facility]  Discharge Instructions: Thank you for choosing [facility] to provide your oncology and hematology care.  If you have a lab appointment with the [facility], please go directly to the [facility] and check in at the registration area.  Wear comfortable clothing and clothing appropriate for easy access to any Portacath or PICC line.   We strive to give you quality time with your provider. You may need to reschedule your appointment if you arrive late (15 or more minutes).  Arriving late affects you and other patients whose appointments are after yours.  Also, if you miss three or more appointments without notifying the office, you may be dismissed from the clinic at the provider's discretion.      For prescription refill requests, have your pharmacy contact our office and allow 72 hours for refills to be completed.    Today you received the following chemotherapy and/or immunotherapy agents: CARBOplatin / PEMEtrexed   To help prevent nausea and vomiting after your treatment, we encourage you to take your nausea medication as directed.  BELOW ARE SYMPTOMS THAT SHOULD BE REPORTED IMMEDIATELY: *FEVER GREATER THAN 100.4 F (38 C) OR HIGHER *CHILLS OR SWEATING *NAUSEA AND VOMITING THAT IS NOT CONTROLLED WITH YOUR NAUSEA MEDICATION *UNUSUAL SHORTNESS OF BREATH *UNUSUAL BRUISING OR BLEEDING *URINARY PROBLEMS (pain or burning when urinating, or frequent urination) *BOWEL PROBLEMS (unusual diarrhea, constipation, pain near the anus) TENDERNESS IN MOUTH AND THROAT WITH OR WITHOUT PRESENCE OF ULCERS (sore throat, sores in mouth, or a toothache) UNUSUAL RASH, SWELLING OR PAIN  UNUSUAL VAGINAL DISCHARGE OR ITCHING   Items with * indicate a potential emergency and should be followed up as soon as possible or go to the Emergency Department if any problems should occur.  Please show the CHEMOTHERAPY ALERT CARD or IMMUNOTHERAPY ALERT CARD at  check-in to the Emergency Department and triage nurse.  Should you have questions after your visit or need to cancel or reschedule your appointment, please contact [facility]  [phone] and follow the prompts.  Office hours are 8:00 a.m. to 4:30 p.m. Monday - Friday. Please note that voicemails left after 4:00 p.m. may not be returned until the following business day.  We are closed weekends and major holidays. You have access to a nurse at all times for urgent questions. Please call the main number to the clinic [phone] and follow the prompts.  For any non-urgent questions, you may also contact your provider using MyChart. We now offer e-Visits for anyone 42 and older to request care online for non-urgent symptoms. For details visit [email or web address].   Also download the MyChart app! Go to the app store, search "MyChart", open the app, select [facility], and log in with your MyChart username and password.  Carboplatin Injection What is this medication? CARBOPLATIN (KAR boe pla tin) treats some types of cancer. It works by slowing down the growth of cancer cells. This medicine may be used for other purposes; ask your health care provider or pharmacist if you have questions. COMMON BRAND NAME(S): Paraplatin What should I tell my care team before I take this medication? They need to know if you have any of these conditions: Blood disorders Hearing problems Kidney disease Recent or ongoing radiation therapy An unusual or allergic reaction to carboplatin, cisplatin, other medications, foods, dyes, or preservatives Pregnant or trying to get pregnant Breast-feeding How should I use this medication? This medication is injected into  a vein. It is given by your care team in a hospital or clinic setting. Talk to your care team about the use of this medication in children. Special care may be needed. Overdosage: If you think you have taken too much of  this medicine contact a poison control center or emergency room at once. NOTE: This medicine is only for you. Do not share this medicine with others. What if I miss a dose? Keep appointments for follow-up doses. It is important not to miss your dose. Call your care team if you are unable to keep an appointment. What may interact with this medication? Medications for seizures Some antibiotics, such as amikacin, gentamicin, neomycin, streptomycin, tobramycin Vaccines This list may not describe all possible interactions. Give your health care provider a list of all the medicines, herbs, non-prescription drugs, or dietary supplements you use. Also tell them if you smoke, drink alcohol, or use illegal drugs. Some items may interact with your medicine. What should I watch for while using this medication? Your condition will be monitored carefully while you are receiving this medication. You may need blood work while taking this medication. This medication may make you feel generally unwell. This is not uncommon, as chemotherapy can affect healthy cells as well as cancer cells. Report any side effects. Continue your course of treatment even though you feel ill unless your care team tells you to stop. In some cases, you may be given additional medications to help with side effects. Follow all directions for their use. This medication may increase your risk of getting an infection. Call your care team for advice if you get a fever, chills, sore throat, or other symptoms of a cold or flu. Do not treat yourself. Try to avoid being around people who are sick. Avoid taking medications that contain aspirin, acetaminophen, ibuprofen, naproxen, or ketoprofen unless instructed by your care team. These medications may hide a fever. Be careful brushing or flossing your teeth or using a toothpick because you may get an infection or bleed more easily. If you have any dental work done, tell your dentist you are receiving  this medication. Talk to your care team if you wish to become pregnant or think you might be pregnant. This medication can cause serious birth defects. Talk to your care team about effective forms of contraception. Do not breast-feed while taking this medication. What side effects may I notice from receiving this medication? Side effects that you should report to your care team as soon as possible: Allergic reactions--skin rash, itching, hives, swelling of the face, lips, tongue, or throat Infection--fever, chills, cough, sore throat, wounds that don't heal, pain or trouble when passing urine, general feeling of discomfort or being unwell Low red blood cell level--unusual weakness or fatigue, dizziness, headache, trouble breathing Pain, tingling, or numbness in the hands or feet, muscle weakness, change in vision, confusion or trouble speaking, loss of balance or coordination, trouble walking, seizures Unusual bruising or bleeding Side effects that usually do not require medical attention (report to your care team if they continue or are bothersome): Hair loss Nausea Unusual weakness or fatigue Vomiting This list may not describe all possible side effects. Call your doctor for medical advice about side effects. You may report side effects to FDA at 1-800-FDA-1088. Where should I keep my medication? This medication is given in a hospital or clinic. It will not be stored at home. NOTE: This sheet is a summary. It may not cover all possible information. If you  have questions about this medicine, talk to your doctor, pharmacist, or health care provider.  2023 Elsevier/Gold Standard (2007-11-25 00:00:00)  Pemetrexed Injection What is this medication? PEMETREXED (PEM e TREX ed) treats some types of cancer. It works by slowing down the growth of cancer cells. This medicine may be used for other purposes; ask your health care provider or pharmacist if you have questions. COMMON BRAND NAME(S):  [place], PEMFEXY What should I tell my care team before I take this medication? They need to know if you have any of these conditions: Infection, such as chickenpox, cold sores, or herpes Kidney disease Low blood cell levels (white cells, red cells, and platelets) Lung or breathing disease, such as asthma Radiation therapy An unusual or allergic reaction to pemetrexed, other medications, foods, dyes, or preservatives If you or your partner are pregnant or trying to get pregnant Breast-feeding How should I use this medication? This medication is injected into a vein. It is given by your care team in a hospital or clinic setting. Talk to your care team about the use of this medication in children. Special care may be needed. Overdosage: If you think you have taken too much of this medicine contact a poison control center or emergency room at once. NOTE: This medicine is only for you. Do not share this medicine with others. What if I miss a dose? Keep appointments for follow-up doses. It is important not to miss your dose. Call your care team if you are unable to keep an appointment. What may interact with this medication? Do not take this medication with any of the following: Live virus vaccines This medication may also interact with the following: Ibuprofen This list may not describe all possible interactions. Give your health care provider a list of all the medicines, herbs, non-prescription drugs, or dietary supplements you use. Also tell them if you smoke, drink alcohol, or use illegal drugs. Some items may interact with your medicine. What should I watch for while using this medication? Your condition will be monitored carefully while you are receiving this medication. This medication may make you feel generally unwell. This is not uncommon as chemotherapy can affect healthy cells as well as cancer cells. Report any side effects. Continue your course of treatment even though you feel ill  unless your care team tells you to stop. This medication can cause serious side effects. To reduce the risk, your care team may give you other medications to take before receiving this one. Be sure to follow the directions from your care team. This medication can cause a rash or redness in areas of the body that have previously had radiation therapy. If you have had radiation therapy, tell your care team if you notice a rash in this area. This medication may increase your risk of getting an infection. Call your care team for advice if you get a fever, chills, sore throat, or other symptoms of a cold or flu. Do not treat yourself. Try to avoid being around people who are sick. Be careful brushing or flossing your teeth or using a toothpick because you may get an infection or bleed more easily. If you have any dental work done, tell your dentist you are receiving this medication. Avoid taking medications that contain aspirin, acetaminophen, ibuprofen, naproxen, or ketoprofen unless instructed by your care team. These medications may hide a fever. Check with your care team if you have severe diarrhea, nausea, and vomiting, or if you sweat a lot. The loss of  too much body fluid may make it dangerous for you to take this medication. Talk to your care team if you or your partner wish to become pregnant or think either of you might be pregnant. This medication can cause serious birth defects if taken during pregnancy and for 6 months after the last dose. A negative pregnancy test is required before starting this medication. A reliable form of contraception is recommended while taking this medication and for 6 months after the last dose. Talk to your care team about reliable forms of contraception. Do not father a child while taking this medication and for 3 months after the last dose. Use a condom while having sex during this time period. Do not breastfeed while taking this medication and for 1 week after the last  dose. This medication may cause infertility. Talk to your care team if you are concerned about your fertility. What side effects may I notice from receiving this medication? Side effects that you should report to your care team as soon as possible: Allergic reactions--skin rash, itching, hives, swelling of the face, lips, tongue, or throat Dry cough, shortness of breath or trouble breathing Infection--fever, chills, cough, sore throat, wounds that don't heal, pain or trouble when passing urine, general feeling of discomfort or being unwell Kidney injury--decrease in the amount of urine, swelling of the ankles, hands, or feet Low red blood cell level--unusual weakness or fatigue, dizziness, headache, trouble breathing Redness, blistering, peeling, or loosening of the skin, including inside the mouth Unusual bruising or bleeding Side effects that usually do not require medical attention (report to your care team if they continue or are bothersome): Fatigue Loss of appetite Nausea Vomiting This list may not describe all possible side effects. Call your doctor for medical advice about side effects. You may report side effects to FDA at 1-800-FDA-1088. Where should I keep my medication? This medication is given in a hospital or clinic. It will not be stored at home. NOTE: This sheet is a summary. It may not cover all possible information. If you have questions about this medicine, talk to your doctor, pharmacist, or health care provider.  2023 Elsevier/Gold Standard (2022-02-08 00:00:00)

## 2022-12-29 NOTE — Progress Notes (Signed)
Patient tolerated first CARBOplatin/ PEMEtrexed infusion well, no questions/concerns voiced. Patient stable at discharge. AVS given.

## 2022-12-30 ENCOUNTER — Telehealth: Payer: Self-pay

## 2022-12-30 NOTE — Telephone Encounter (Signed)
Telephone call to patient for follow up after receiving first infusion.   Patient states infusion went great.  States eating good and drinking plenty of fluids.   Denies any nausea or vomiting.  Encouraged patient to call for any concerns or questions. 

## 2023-01-03 ENCOUNTER — Inpatient Hospital Stay: Payer: Medicare Other

## 2023-01-03 ENCOUNTER — Encounter: Payer: Self-pay | Admitting: Hospice and Palliative Medicine

## 2023-01-03 ENCOUNTER — Inpatient Hospital Stay (HOSPITAL_BASED_OUTPATIENT_CLINIC_OR_DEPARTMENT_OTHER): Payer: Medicare Other | Admitting: Hospice and Palliative Medicine

## 2023-01-03 VITALS — BP 116/72 | HR 67 | Temp 97.8°F | Wt 147.7 lb

## 2023-01-03 DIAGNOSIS — Z515 Encounter for palliative care: Secondary | ICD-10-CM | POA: Diagnosis not present

## 2023-01-03 DIAGNOSIS — C3412 Malignant neoplasm of upper lobe, left bronchus or lung: Secondary | ICD-10-CM

## 2023-01-03 DIAGNOSIS — Z95828 Presence of other vascular implants and grafts: Secondary | ICD-10-CM

## 2023-01-03 DIAGNOSIS — Z5111 Encounter for antineoplastic chemotherapy: Secondary | ICD-10-CM | POA: Diagnosis not present

## 2023-01-03 LAB — CBC WITH DIFFERENTIAL/PLATELET
Abs Immature Granulocytes: 0.02 10*3/uL (ref 0.00–0.07)
Basophils Absolute: 0 10*3/uL (ref 0.0–0.1)
Basophils Relative: 0 %
Eosinophils Absolute: 0 10*3/uL (ref 0.0–0.5)
Eosinophils Relative: 1 %
HCT: 38.7 % — ABNORMAL LOW (ref 39.0–52.0)
Hemoglobin: 13 g/dL (ref 13.0–17.0)
Immature Granulocytes: 0 %
Lymphocytes Relative: 13 %
Lymphs Abs: 0.7 10*3/uL (ref 0.7–4.0)
MCH: 31 pg (ref 26.0–34.0)
MCHC: 33.6 g/dL (ref 30.0–36.0)
MCV: 92.4 fL (ref 80.0–100.0)
Monocytes Absolute: 0.1 10*3/uL (ref 0.1–1.0)
Monocytes Relative: 1 %
Neutro Abs: 4.7 10*3/uL (ref 1.7–7.7)
Neutrophils Relative %: 85 %
Platelets: 106 10*3/uL — ABNORMAL LOW (ref 150–400)
RBC: 4.19 MIL/uL — ABNORMAL LOW (ref 4.22–5.81)
RDW: 13.2 % (ref 11.5–15.5)
WBC: 5.5 10*3/uL (ref 4.0–10.5)
nRBC: 0 % (ref 0.0–0.2)

## 2023-01-03 LAB — BASIC METABOLIC PANEL
Anion gap: 8 (ref 5–15)
BUN: 29 mg/dL — ABNORMAL HIGH (ref 8–23)
CO2: 28 mmol/L (ref 22–32)
Calcium: 9.1 mg/dL (ref 8.9–10.3)
Chloride: 97 mmol/L — ABNORMAL LOW (ref 98–111)
Creatinine, Ser: 0.57 mg/dL — ABNORMAL LOW (ref 0.61–1.24)
GFR, Estimated: >60 mL/min (ref >60)
Glucose, Bld: 89 mg/dL (ref 70–99)
Potassium: 3.9 mmol/L (ref 3.5–5.1)
Sodium: 133 mmol/L — ABNORMAL LOW (ref 135–145)

## 2023-01-03 MED ORDER — SODIUM CHLORIDE 0.9% FLUSH
10.0000 mL | Freq: Once | INTRAVENOUS | Status: AC
  Administered 2023-01-03: 10 mL via INTRAVENOUS
  Filled 2023-01-03: qty 10

## 2023-01-03 MED ORDER — HEPARIN SOD (PORK) LOCK FLUSH 100 UNIT/ML IV SOLN
500.0000 [IU] | Freq: Once | INTRAVENOUS | Status: AC
  Administered 2023-01-03: 500 [IU] via INTRAVENOUS
  Filled 2023-01-03: qty 5

## 2023-01-03 NOTE — Progress Notes (Signed)
[place] at [facility] [phone] [phone]   Name: Jared Mullen Date: 01/03/2023 MRN: [id number]  DOB: 05-09-1941  Jared Mullen Care Team: Birdie Sons, MD as PCP - General (Family Medicine) Rockey Situ, Kathlene November, MD as PCP - Cardiology (Cardiology) Franchot Gallo, MD as Consulting Physician (Urology) Telford Nab, RN as Oncology Nurse Navigator    REASON FOR CONSULTATION: CHRSITOPHER Mullen is a 82 y.o. male with multiple medical problems including with multiple medical problems including with multiple medical problems including history of prostate cancer, sleep apnea, and stage IV lung cancer with progression on Tagrisso.  Jared Mullen is status post lung biopsy with pathology consistent with adenocarcinoma.  Palliative care was consulted to help address goals. .   SOCIAL HISTORY:     reports that he quit smoking about 42 years ago. His smoking use included cigarettes. He has a 56.00 pack-year smoking history. He has never used smokeless tobacco. He reports that he does not drink alcohol and does not use drugs.  Jared Mullen is recently a widower after being married for 60 years.  Jared Mullen's wife died 2021-10-31 at the [facility].  Jared Mullen has 3 daughters who are involved in his care.  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  ADVANCE DIRECTIVES:  Does not have  CODE STATUS: DNR/DNI (DNR form signed on 01/03/2023)  PAST MEDICAL HISTORY: Past Medical History:  Diagnosis Date   Abnormal ECG    a. baseline inferolateral ST elevation dating back to at least 2012.   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   Sleep apnea    Urinary frequency    Weak urinary stream    Weight loss     PAST SURGICAL HISTORY:  Past Surgical History:  Procedure  Laterality Date   biospy  09/30/2011-   TRUS/Bx Prostate - 5/12/biopsies positive for cancer, glandular size 32.5 cc   CHOLECYSTECTOMY  1992   IR IMAGING GUIDED PORT INSERTION  12/22/2022   RADIOACTIVE SEED IMPLANT  02/09/2012   Procedure: RADIOACTIVE SEED IMPLANT;  Surgeon: Franchot Gallo, MD;  Location: [facility];  Service: Urology;  Laterality: N/A;  RAD TECH OK PER ANNE AT MAIN OR    UVULOPALATOPHARYNGOPLASTY     VIDEO BRONCHOSCOPY WITH ENDOBRONCHIAL ULTRASOUND Bilateral 11/12/2021   Procedure: VIDEO BRONCHOSCOPY WITH ENDOBRONCHIAL ULTRASOUND;  Surgeon: Ottie Glazier, MD;  Location: [facility];  Service: Thoracic;  Laterality: Bilateral;    HEMATOLOGY/ONCOLOGY HISTORY:  Oncology History  Primary cancer of left upper lobe of lung ([place])  11/24/2021 Initial Diagnosis   Primary cancer of left upper lobe of lung ([place])   11/24/2021 Cancer Staging   Staging form: Lung, AJCC 8th Edition - Clinical stage from 11/24/2021: Stage IV (cT3, cN2, cM1) - Signed by Sindy Guadeloupe, MD on 11/24/2021 Histopathologic type: Adenocarcinoma, NOS   12/29/2022 -  Chemotherapy   Jared Mullen is on Treatment Plan : LUNG NSCLC Pemetrexed + Carboplatin q21d x 4 Cycles       ALLERGIES:  has No Known Allergies.  MEDICATIONS:  Current Outpatient Medications  Medication Sig Dispense Refill   acetaminophen (TYLENOL) 325 MG tablet Take 650 mg by mouth every 6 (six) hours as needed.     dexamethasone (DECADRON) 4 MG tablet Take 1 tab 2  times daily starting day before pemetrexed. Then take 2 tabs daily x 3 days starting day after carboplatin. Take with food. 30 tablet 1   erythromycin ophthalmic ointment APPLY THIN RIBBON IN LEFT EYE TWO TIMES A DAY DAILY AND NIGHTLY IN THE LEFT EYE.     folic acid (FOLVITE) 1 MG tablet Take 1 tablet (1 mg total) by mouth daily. Start 7 days before pemetrexed chemotherapy. Continue until 21 days after pemetrexed completed. 100 tablet 3   lidocaine-prilocaine (EMLA) cream Apply  1 Application topically as needed. Apply 1 hour prior to port a cath access 30 g 3   ondansetron (ZOFRAN) 8 MG tablet Take 1 tablet (8 mg total) by mouth every 8 (eight) hours as needed for nausea or vomiting. Start on the third day after carboplatin. 30 tablet 1   prochlorperazine (COMPAZINE) 10 MG tablet Take 1 tablet (10 mg total) by mouth every 6 (six) hours as needed for nausea or vomiting. 30 tablet 1   triamcinolone ointment (KENALOG) 0.5 % Apply 1 Application topically 2 (two) times daily. 30 g 1   No current facility-administered medications for this visit.    VITAL SIGNS: There were no vitals taken for this visit. There were no vitals filed for this visit.  Estimated body mass index is 21.24 kg/m as calculated from the following:   Height as of 12/22/22: 5\' 9"  (1.753 m).   Weight as of 12/29/22: 143 lb 12.8 oz (65.2 kg).  LABS: CBC:    Component Value Date/Time   WBC 5.5 01/03/2023 1253   HGB 13.0 01/03/2023 1253   HGB 12.9 (L) 12/29/2022 0756   HGB 15.1 11/02/2021 0954   HCT 38.7 (L) 01/03/2023 1253   HCT 44.7 11/02/2021 0954   PLT 106 (L) 01/03/2023 1253   PLT 151 12/29/2022 0756   PLT 222 11/02/2021 0954   MCV 92.4 01/03/2023 1253   MCV 90 11/02/2021 0954   NEUTROABS 4.7 01/03/2023 1253   LYMPHSABS 0.7 01/03/2023 1253   MONOABS 0.1 01/03/2023 1253   EOSABS 0.0 01/03/2023 1253   BASOSABS 0.0 01/03/2023 1253   Comprehensive Metabolic Panel:    Component Value Date/Time   NA 137 12/29/2022 0756   NA 141 11/02/2021 0954   K 3.7 12/29/2022 0756   CL 101 12/29/2022 0756   CO2 28 12/29/2022 0756   BUN 21 12/29/2022 0756   BUN 17 11/02/2021 0954   CREATININE 0.90 12/29/2022 0756   GLUCOSE 127 (H) 12/29/2022 0756   CALCIUM 9.3 12/29/2022 0756   AST 27 12/29/2022 0756   ALT 18 12/29/2022 0756   ALKPHOS 69 12/29/2022 0756   BILITOT 1.0 12/29/2022 0756   PROT 7.2 12/29/2022 0756   PROT 7.0 11/02/2021 0954   ALBUMIN 3.8 12/29/2022 0756   ALBUMIN 3.8 11/02/2021  0954    RADIOGRAPHIC STUDIES: IR IMAGING GUIDED PORT INSERTION  Result Date: 12/22/2022 CLINICAL DATA:  Metastatic adenocarcinoma of the left lung and need for porta cath to begin chemotherapy. EXAM: IMPLANTED PORT A CATH PLACEMENT WITH ULTRASOUND AND FLUOROSCOPIC GUIDANCE ANESTHESIA/SEDATION: Moderate (conscious) sedation was employed during this procedure. A total of Versed 1.5 mg and Fentanyl 75 mcg was administered intravenously by radiology nursing. Moderate Sedation Time: 35 minutes. The Jared Mullen's level of consciousness and vital signs were monitored continuously by radiology nursing throughout the procedure under my direct supervision. FLUOROSCOPY: 24 seconds.  8.4 mGy. PROCEDURE: The procedure, risks, benefits, and alternatives were explained to the Jared Mullen. Questions regarding the procedure were encouraged and answered. The  Jared Mullen understands and consents to the procedure. A time-out was performed prior to initiating the procedure. Ultrasound was utilized to confirm patency of the right internal jugular vein. A permanent ultrasound image was saved and recorded. The right neck and chest were prepped with chlorhexidine in a sterile fashion, and a sterile drape was applied covering the operative field. Maximum barrier sterile technique with sterile gowns and gloves were used for the procedure. Local anesthesia was provided with 1% lidocaine. After creating a small venotomy incision, a 21 gauge needle was advanced into the right internal jugular vein under direct, real-time ultrasound guidance. Ultrasound image documentation was performed. After securing guidewire access, an 8 Fr dilator was placed. A J-wire was kinked to measure appropriate catheter length. A subcutaneous port pocket was then created along the upper chest wall utilizing sharp and blunt dissection. Portable cautery was utilized. The pocket was irrigated with sterile saline. A single lumen power injectable port was chosen for placement.  The 8 Fr catheter was tunneled from the port pocket site to the venotomy incision. The port was placed in the pocket. External catheter was trimmed to appropriate length based on guidewire measurement. At the venotomy, an 8 Fr peel-away sheath was placed over a guidewire. The catheter was then placed through the sheath and the sheath removed. Final catheter positioning was confirmed and documented with a fluoroscopic spot image. The port was accessed with a needle and aspirated and flushed with heparinized saline. The access needle was removed. The venotomy and port pocket incisions were closed with subcutaneous 3-0 Monocryl and subcuticular 4-0 Vicryl. Dermabond was applied to both incisions. COMPLICATIONS: COMPLICATIONS None FINDINGS: After catheter placement, the tip lies at the cavo-atrial junction. The catheter aspirates normally and is ready for immediate use. IMPRESSION: Placement of single lumen port a cath via right internal jugular vein. The catheter tip lies at the cavo-atrial junction. A power injectable port a cath was placed and is ready for immediate use. Electronically Signed   By: Aletta Edouard M.D.   On: 12/22/2022 15:46    PERFORMANCE STATUS (ECOG) : 2 - Symptomatic, <50% confined to bed  Review of Systems Unless otherwise noted, a complete review of systems is negative.  Physical Exam General: NAD Pulmonary: Nonlabored Extremities: no edema, no joint deformities Skin: no rashes Neurological: Weakness but otherwise nonfocal  IMPRESSION: Jared Mullen recently saw Dr. Janese Banks for discussion of NGS testing, which did not show any actionable mutation.  Options for best supportive care versus chemotherapy were discussed.  Jared Mullen opted to proceed with chemotherapy.  Jared Mullen received cycle 1 carbo plus Alimta chemotherapy.  Jared Mullen returns today to assess symptoms and provide with as needed supportive care.  Today, reports he is doing reasonably well.  He denies any significant changes or  concerns since receiving chemotherapy.  No new symptomatic complaints.  Jared Mullen reports that he is eating and drinking adequately.  Labs reviewed.  Thrombocytopenia noted, likely secondary to chemotherapy.  Will trend labs with follow-up in 2 weeks with Dr. Janese Banks.  Jared Mullen has mild hyponatremia.  Jared Mullen declined IV fluids today.  Discussed overall goals.  Jared Mullen continues to desire ongoing chemotherapy.  He recognizes that at some point he likely will need best supportive care/hospice but hopes that is in the distant future.  Discussed code status. Jared Mullen confirmed DNR/DNI. Daughter verbalized agreement. DNR order signed for Jared Mullen to take home.   Symptomatically, he endorses occasional constipation. He takes Miralax and prunes. Recommend increasing Miralax to twice daily.   PLAN: -Continue current  scope of treatment -Increase Miralax to twice daily -DNR/DNI -Follow-up telephone visit 1 month   Jared Mullen expressed understanding and was in agreement with this plan. He also understands that He can call the clinic at any time with any questions, concerns, or complaints.     Time Total: 15 minutes  Visit consisted of counseling and education dealing with the complex and emotionally intense issues of symptom management and palliative care in the setting of serious and potentially life-threatening illness.Greater than 50%  of this time was spent counseling and coordinating care related to the above assessment and plan.  Signed by: Altha Harm, PhD, NP-C

## 2023-01-06 ENCOUNTER — Telehealth: Payer: Self-pay | Admitting: Family Medicine

## 2023-01-06 NOTE — Telephone Encounter (Signed)
Called patient to schedule Medicare Annual Wellness Visit (AWV). Left message for patient to call back and schedule Medicare Annual Wellness Visit (AWV).  Last date of AWV: 11/02/2021  Please schedule an appointment at any time with [facility] .  If any questions, please contact me at [phone].  Thank you ,  [place] Direct Dial: [phone]

## 2023-01-17 ENCOUNTER — Telehealth: Payer: Medicare Other | Admitting: Hospice and Palliative Medicine

## 2023-01-18 MED FILL — Dexamethasone Sodium Phosphate Inj 100 MG/10ML: INTRAMUSCULAR | Qty: 1 | Status: AC

## 2023-01-19 ENCOUNTER — Encounter: Payer: Self-pay | Admitting: Oncology

## 2023-01-19 ENCOUNTER — Inpatient Hospital Stay: Payer: Medicare Other | Attending: Hospice and Palliative Medicine

## 2023-01-19 ENCOUNTER — Inpatient Hospital Stay: Payer: Medicare Other

## 2023-01-19 ENCOUNTER — Inpatient Hospital Stay (HOSPITAL_BASED_OUTPATIENT_CLINIC_OR_DEPARTMENT_OTHER): Payer: Medicare Other | Admitting: Oncology

## 2023-01-19 VITALS — BP 113/78 | HR 81 | Temp 95.4°F | Resp 18 | Ht 68.0 in | Wt 143.6 lb

## 2023-01-19 DIAGNOSIS — G4730 Sleep apnea, unspecified: Secondary | ICD-10-CM | POA: Diagnosis not present

## 2023-01-19 DIAGNOSIS — Z5111 Encounter for antineoplastic chemotherapy: Secondary | ICD-10-CM | POA: Diagnosis present

## 2023-01-19 DIAGNOSIS — C3412 Malignant neoplasm of upper lobe, left bronchus or lung: Secondary | ICD-10-CM

## 2023-01-19 DIAGNOSIS — R634 Abnormal weight loss: Secondary | ICD-10-CM | POA: Diagnosis not present

## 2023-01-19 DIAGNOSIS — D72819 Decreased white blood cell count, unspecified: Secondary | ICD-10-CM | POA: Insufficient documentation

## 2023-01-19 DIAGNOSIS — R351 Nocturia: Secondary | ICD-10-CM | POA: Insufficient documentation

## 2023-01-19 DIAGNOSIS — N486 Induration penis plastica: Secondary | ICD-10-CM | POA: Diagnosis not present

## 2023-01-19 DIAGNOSIS — Z87891 Personal history of nicotine dependence: Secondary | ICD-10-CM | POA: Diagnosis not present

## 2023-01-19 DIAGNOSIS — C787 Secondary malignant neoplasm of liver and intrahepatic bile duct: Secondary | ICD-10-CM | POA: Insufficient documentation

## 2023-01-19 DIAGNOSIS — Z801 Family history of malignant neoplasm of trachea, bronchus and lung: Secondary | ICD-10-CM | POA: Diagnosis not present

## 2023-01-19 DIAGNOSIS — Z8546 Personal history of malignant neoplasm of prostate: Secondary | ICD-10-CM | POA: Insufficient documentation

## 2023-01-19 DIAGNOSIS — R5383 Other fatigue: Secondary | ICD-10-CM | POA: Insufficient documentation

## 2023-01-19 DIAGNOSIS — R590 Localized enlarged lymph nodes: Secondary | ICD-10-CM | POA: Diagnosis not present

## 2023-01-19 DIAGNOSIS — R262 Difficulty in walking, not elsewhere classified: Secondary | ICD-10-CM | POA: Insufficient documentation

## 2023-01-19 DIAGNOSIS — Z923 Personal history of irradiation: Secondary | ICD-10-CM | POA: Diagnosis not present

## 2023-01-19 DIAGNOSIS — Z803 Family history of malignant neoplasm of breast: Secondary | ICD-10-CM | POA: Diagnosis not present

## 2023-01-19 DIAGNOSIS — Z79899 Other long term (current) drug therapy: Secondary | ICD-10-CM | POA: Insufficient documentation

## 2023-01-19 LAB — CBC WITH DIFFERENTIAL (CANCER CENTER ONLY)
Abs Immature Granulocytes: 0.02 10*3/uL (ref 0.00–0.07)
Basophils Absolute: 0 10*3/uL (ref 0.0–0.1)
Basophils Relative: 0 %
Eosinophils Absolute: 0 10*3/uL (ref 0.0–0.5)
Eosinophils Relative: 0 %
HCT: 35.7 % — ABNORMAL LOW (ref 39.0–52.0)
Hemoglobin: 12.1 g/dL — ABNORMAL LOW (ref 13.0–17.0)
Immature Granulocytes: 1 %
Lymphocytes Relative: 11 %
Lymphs Abs: 0.4 10*3/uL — ABNORMAL LOW (ref 0.7–4.0)
MCH: 31.4 pg (ref 26.0–34.0)
MCHC: 33.9 g/dL (ref 30.0–36.0)
MCV: 92.7 fL (ref 80.0–100.0)
Monocytes Absolute: 0.3 10*3/uL (ref 0.1–1.0)
Monocytes Relative: 8 %
Neutro Abs: 2.9 10*3/uL (ref 1.7–7.7)
Neutrophils Relative %: 80 %
Platelet Count: 236 10*3/uL (ref 150–400)
RBC: 3.85 MIL/uL — ABNORMAL LOW (ref 4.22–5.81)
RDW: 13.1 % (ref 11.5–15.5)
WBC Count: 3.6 10*3/uL — ABNORMAL LOW (ref 4.0–10.5)
nRBC: 0 % (ref 0.0–0.2)

## 2023-01-19 LAB — CMP (CANCER CENTER ONLY)
ALT: 21 U/L (ref 0–44)
AST: 22 U/L (ref 15–41)
Albumin: 3.5 g/dL (ref 3.5–5.0)
Alkaline Phosphatase: 88 U/L (ref 38–126)
Anion gap: 9 (ref 5–15)
BUN: 22 mg/dL (ref 8–23)
CO2: 27 mmol/L (ref 22–32)
Calcium: 9.5 mg/dL (ref 8.9–10.3)
Chloride: 101 mmol/L (ref 98–111)
Creatinine: 0.68 mg/dL (ref 0.61–1.24)
GFR, Estimated: >60 mL/min (ref >60)
Glucose, Bld: 175 mg/dL — ABNORMAL HIGH (ref 70–99)
Potassium: 3.6 mmol/L (ref 3.5–5.1)
Sodium: 137 mmol/L (ref 135–145)
Total Bilirubin: 0.5 mg/dL (ref 0.3–1.2)
Total Protein: 7.1 g/dL (ref 6.5–8.1)

## 2023-01-19 MED ORDER — SODIUM CHLORIDE 0.9 % IV SOLN
500.0000 mg/m2 | Freq: Once | INTRAVENOUS | Status: AC
  Administered 2023-01-19: 900 mg via INTRAVENOUS
  Filled 2023-01-19: qty 20

## 2023-01-19 MED ORDER — SODIUM CHLORIDE 0.9 % IV SOLN
150.0000 mg | Freq: Once | INTRAVENOUS | Status: AC
  Administered 2023-01-19: 150 mg via INTRAVENOUS
  Filled 2023-01-19: qty 150

## 2023-01-19 MED ORDER — SODIUM CHLORIDE 0.9 % IV SOLN
Freq: Once | INTRAVENOUS | Status: AC
  Filled 2023-01-19: qty 250

## 2023-01-19 MED ORDER — PALONOSETRON HCL INJECTION 0.25 MG/5ML
0.2500 mg | Freq: Once | INTRAVENOUS | Status: AC
  Administered 2023-01-19: 0.25 mg via INTRAVENOUS
  Filled 2023-01-19: qty 5

## 2023-01-19 MED ORDER — SODIUM CHLORIDE 0.9 % IV SOLN
317.2000 mg | Freq: Once | INTRAVENOUS | Status: AC
  Administered 2023-01-19: 320 mg via INTRAVENOUS
  Filled 2023-01-19: qty 32

## 2023-01-19 MED ORDER — SODIUM CHLORIDE 0.9 % IV SOLN
10.0000 mg | Freq: Once | INTRAVENOUS | Status: AC
  Administered 2023-01-19: 10 mg via INTRAVENOUS
  Filled 2023-01-19: qty 10

## 2023-01-19 MED ORDER — HEPARIN SOD (PORK) LOCK FLUSH 100 UNIT/ML IV SOLN
500.0000 [IU] | Freq: Once | INTRAVENOUS | Status: AC | PRN
  Administered 2023-01-19: 500 [IU]
  Filled 2023-01-19: qty 5

## 2023-01-19 NOTE — Progress Notes (Signed)
No concerns for the provider. 

## 2023-01-19 NOTE — Progress Notes (Signed)
Hematology/Oncology Consult note [facility]  [phone] [phone]  Patient Care Team: Birdie Sons, MD as PCP - General (Family Medicine) Rockey Situ Kathlene November, MD as PCP - Cardiology (Cardiology) Franchot Gallo, MD as Consulting Physician (Urology) Telford Nab, RN as Oncology Nurse Navigator   Name of the patient: Jared Mullen  ZI:[id number]  1941-08-25   Date of visit: 01/19/23  Diagnosis- metastatic EGFR positive lung cancer    Chief complaint/ Reason for visit-on treatment assessment prior to cycle 2 of carbo Alimta chemotherapy  Heme/Onc history: Patient is a 82 year old male who was admitted to the hospital in January 2023 with symptoms of generalized weakness cough and ongoing weight loss.  CT chest showed left perihilar upper lobe masslike opacity measuring 6.6 x 5 x 6.1 cm contiguous with the hilum.  Left hilar adenopathy as well as paratracheal adenopathy.  Severe narrowing of the left upper lobe bronchus due to left perihilar mass.  CT abdomen and pelvis with contrast showed large lytic lesion involving the right iliac bone.  MRI brain with and without contrast did not show any evidence of metastatic disease.  Patient underwent bronchoscopy with biopsy of the left hilar mass which was consistent with adenocarcinoma.   NGS testing showed EGFR L858R mutation that could be targetable with Tagrisso.  Tumor mutational burden high.  PD-L1 less than 1%.  No other actionable mutations.  Patient has been on Tagrisso since January 2023.  Progression of disease and February 2024 as evidenced by increase in liver metastases as well as mixed response to mediastinal adenopathy.  Patient switched to [place] Alimta chemotherapy    Interval history-patient reports some ongoing fatigue but otherwise tolerated chemotherapy well without any significant nausea or vomiting.  ECOG PS- 1 Pain scale- 0  Review of systems- Review of Systems   Constitutional:  Positive for malaise/fatigue. Negative for chills, fever and weight loss.  HENT:  Negative for congestion, ear discharge and nosebleeds.   Eyes:  Negative for blurred vision.  Respiratory:  Negative for cough, hemoptysis, sputum production, shortness of breath and wheezing.   Cardiovascular:  Negative for chest pain, palpitations, orthopnea and claudication.  Gastrointestinal:  Negative for abdominal pain, blood in stool, constipation, diarrhea, heartburn, melena, nausea and vomiting.  Genitourinary:  Negative for dysuria, flank pain, frequency, hematuria and urgency.  Musculoskeletal:  Negative for back pain, joint pain and myalgias.  Skin:  Negative for rash.  Neurological:  Negative for dizziness, tingling, focal weakness, seizures, weakness and headaches.  Endo/Heme/Allergies:  Does not bruise/bleed easily.  Psychiatric/Behavioral:  Negative for depression and suicidal ideas. The patient does not have insomnia.       No Known Allergies   Past Medical History:  Diagnosis Date   Abnormal ECG    a. baseline inferolateral ST elevation dating back to at least 2012.   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   Sleep apnea    Urinary frequency    Weak urinary stream    Weight loss      Past Surgical History:  Procedure Laterality Date   biospy  09/30/2011-   TRUS/Bx Prostate - 5/12/biopsies positive for cancer, glandular size 32.5 cc   CHOLECYSTECTOMY  1992   IR IMAGING GUIDED PORT INSERTION  12/22/2022   RADIOACTIVE SEED IMPLANT  02/09/2012   Procedure:  RADIOACTIVE SEED IMPLANT;  Surgeon: Franchot Gallo, MD;  Location: [facility];  Service: Urology;  Laterality: N/A;  RAD TECH OK PER ANNE AT MAIN OR    UVULOPALATOPHARYNGOPLASTY     VIDEO BRONCHOSCOPY WITH ENDOBRONCHIAL ULTRASOUND Bilateral 11/12/2021   Procedure:  VIDEO BRONCHOSCOPY WITH ENDOBRONCHIAL ULTRASOUND;  Surgeon: Ottie Glazier, MD;  Location: [facility];  Service: Thoracic;  Laterality: Bilateral;    Social History   Socioeconomic History   Marital status: Widowed    Spouse name: Not on file   Number of children: 3   Years of education: Not on file   Highest education level: High school graduate  Occupational History   Occupation: retired  Tobacco Use   Smoking status: Former    Packs/day: 2.00    Years: 28.00    Additional pack years: 0.00    Total pack years: 56.00    Types: Cigarettes    Quit date: 10/18/1980    Years since quitting: 42.2   Smokeless tobacco: Never  Vaping Use   Vaping Use: Never used  Substance and Sexual Activity   Alcohol use: No   Drug use: No   Sexual activity: Not on file    Comment: Low libido  Other Topics Concern   Not on file  Social History Narrative   Lives locally.  Was caring for his wife until she passed in December.  Sedentary.   Social Determinants of Health   Financial Resource Strain: Low Risk  (05/14/2019)   Overall Financial Resource Strain (CARDIA)    Difficulty of Paying Living Expenses: Not hard at all  Food Insecurity: No Food Insecurity (05/14/2019)   Hunger Vital Sign    Worried About Running Out of Food in the Last Year: Never true    Ran Out of Food in the Last Year: Never true  Transportation Needs: No Transportation Needs (05/14/2019)   PRAPARE - Hydrologist (Medical): No    Lack of Transportation (Non-Medical): No  Physical Activity: Inactive (05/14/2019)   Exercise Vital Sign    Days of Exercise per Week: 0 days    Minutes of Exercise per Session: 0 min  Stress: No Stress Concern Present (05/14/2019)   [place]    Feeling of Stress : Not at all  Social Connections: Unknown (05/14/2019)   Social Connection and Isolation Panel [NHANES]    Frequency of Communication with  Friends and Family: Patient declined    Frequency of Social Gatherings with Friends and Family: Patient declined    Attends Religious Services: Patient declined    Marine scientist or Organizations: Patient declined    Attends Archivist Meetings: Patient declined    Marital Status: Patient declined  Intimate Partner Violence: Unknown (05/14/2019)   Humiliation, Afraid, Rape, and Kick questionnaire    Fear of Current or Ex-Partner: Patient declined    Emotionally Abused: Patient declined    Physically Abused: Patient declined    Sexually Abused: Patient declined    Family History  Problem Relation Age of Onset   Heart disease Father    Breast cancer Sister    Cancer Sister        lung   Lung cancer Brother    Cancer Brother        bladder   Lung cancer Sister    Cancer Sister        breast   Kidney disease Brother  Current Outpatient Medications:    acetaminophen (TYLENOL) 325 MG tablet, Take 650 mg by mouth every 6 (six) hours as needed., Disp: , Rfl:    dexamethasone (DECADRON) 4 MG tablet, Take 1 tab 2 times daily starting day before pemetrexed. Then take 2 tabs daily x 3 days starting day after carboplatin. Take with food., Disp: 30 tablet, Rfl: 1   erythromycin ophthalmic ointment, APPLY THIN RIBBON IN LEFT EYE TWO TIMES A DAY DAILY AND NIGHTLY IN THE LEFT EYE., Disp: , Rfl:    folic acid (FOLVITE) 1 MG tablet, Take 1 tablet (1 mg total) by mouth daily. Start 7 days before pemetrexed chemotherapy. Continue until 21 days after pemetrexed completed., Disp: 100 tablet, Rfl: 3   lidocaine-prilocaine (EMLA) cream, Apply 1 Application topically as needed. Apply 1 hour prior to port a cath access, Disp: 30 g, Rfl: 3   ondansetron (ZOFRAN) 8 MG tablet, Take 1 tablet (8 mg total) by mouth every 8 (eight) hours as needed for nausea or vomiting. Start on the third day after carboplatin., Disp: 30 tablet, Rfl: 1   prochlorperazine (COMPAZINE) 10 MG tablet, Take 1  tablet (10 mg total) by mouth every 6 (six) hours as needed for nausea or vomiting., Disp: 30 tablet, Rfl: 1   triamcinolone ointment (KENALOG) 0.5 %, Apply 1 Application topically 2 (two) times daily., Disp: 30 g, Rfl: 1  Physical exam:  Vitals:   01/19/23 0831  BP: 113/78  Pulse: 81  Resp: 18  Temp: (!) 95.4 F (35.2 C)  TempSrc: Tympanic  SpO2: 100%  Weight: 143 lb 9.6 oz (65.1 kg)  Height: 5\' 8"  (1.727 m)   Physical Exam Cardiovascular:     Rate and Rhythm: Normal rate and regular rhythm.     Heart sounds: Normal heart sounds.  Pulmonary:     Effort: Pulmonary effort is normal.     Breath sounds: Normal breath sounds.  Skin:    General: Skin is warm and dry.  Neurological:     Mental Status: He is alert and oriented to person, place, and time.         Latest Ref Rng & Units 01/19/2023    8:15 AM  CMP  Glucose 70 - 99 mg/dL 175   BUN 8 - 23 mg/dL 22   Creatinine 0.61 - 1.24 mg/dL 0.68   Sodium 135 - 145 mmol/L 137   Potassium 3.5 - 5.1 mmol/L 3.6   Chloride 98 - 111 mmol/L 101   CO2 22 - 32 mmol/L 27   Calcium 8.9 - 10.3 mg/dL 9.5   Total Protein 6.5 - 8.1 g/dL 7.1   Total Bilirubin 0.3 - 1.2 mg/dL 0.5   Alkaline Phos 38 - 126 U/L 88   AST 15 - 41 U/L 22   ALT 0 - 44 U/L 21       Latest Ref Rng & Units 01/19/2023    8:15 AM  CBC  WBC 4.0 - 10.5 K/uL 3.6   Hemoglobin 13.0 - 17.0 g/dL 12.1   Hematocrit 39.0 - 52.0 % 35.7   Platelets 150 - 400 K/uL 236     No images are attached to the encounter.  IR IMAGING GUIDED PORT INSERTION  Result Date: 12/22/2022 CLINICAL DATA:  Metastatic adenocarcinoma of the left lung and need for porta cath to begin chemotherapy. EXAM: IMPLANTED PORT A CATH PLACEMENT WITH ULTRASOUND AND FLUOROSCOPIC GUIDANCE ANESTHESIA/SEDATION: Moderate (conscious) sedation was employed during this procedure. A total of Versed 1.5 mg and Fentanyl  75 mcg was administered intravenously by radiology nursing. Moderate Sedation Time: 35 minutes. The  patient's level of consciousness and vital signs were monitored continuously by radiology nursing throughout the procedure under my direct supervision. FLUOROSCOPY: 24 seconds.  8.4 mGy. PROCEDURE: The procedure, risks, benefits, and alternatives were explained to the patient. Questions regarding the procedure were encouraged and answered. The patient understands and consents to the procedure. A time-out was performed prior to initiating the procedure. Ultrasound was utilized to confirm patency of the right internal jugular vein. A permanent ultrasound image was saved and recorded. The right neck and chest were prepped with chlorhexidine in a sterile fashion, and a sterile drape was applied covering the operative field. Maximum barrier sterile technique with sterile gowns and gloves were used for the procedure. Local anesthesia was provided with 1% lidocaine. After creating a small venotomy incision, a 21 gauge needle was advanced into the right internal jugular vein under direct, real-time ultrasound guidance. Ultrasound image documentation was performed. After securing guidewire access, an 8 Fr dilator was placed. A J-wire was kinked to measure appropriate catheter length. A subcutaneous port pocket was then created along the upper chest wall utilizing sharp and blunt dissection. Portable cautery was utilized. The pocket was irrigated with sterile saline. A single lumen power injectable port was chosen for placement. The 8 Fr catheter was tunneled from the port pocket site to the venotomy incision. The port was placed in the pocket. External catheter was trimmed to appropriate length based on guidewire measurement. At the venotomy, an 8 Fr peel-away sheath was placed over a guidewire. The catheter was then placed through the sheath and the sheath removed. Final catheter positioning was confirmed and documented with a fluoroscopic spot image. The port was accessed with a needle and aspirated and flushed with  heparinized saline. The access needle was removed. The venotomy and port pocket incisions were closed with subcutaneous 3-0 Monocryl and subcuticular 4-0 Vicryl. Dermabond was applied to both incisions. COMPLICATIONS: COMPLICATIONS None FINDINGS: After catheter placement, the tip lies at the cavo-atrial junction. The catheter aspirates normally and is ready for immediate use. IMPRESSION: Placement of single lumen port a cath via right internal jugular vein. The catheter tip lies at the cavo-atrial junction. A power injectable port a cath was placed and is ready for immediate use. Electronically Signed   By: Aletta Edouard M.D.   On: 12/22/2022 15:46     Assessment and plan- Patient is a 82 y.o. male  with metastatic EGFR positive lung cancer with progression on Tagrisso. He is here for on treatment assessment prior to cycle 2 of carbo Alimta chemotherapy  Counts okay to proceed with cycle 2 of carbo Alimta chemotherapy today.  I will see him back in 3 weeksCycle 3.  Plan to repeat scans after 4 cycles.  He will receive IV fluids today.  BMP and possible fluids again in 10 days and see NP Altha Harm  I did discuss the recent data which showed addition of Amivantamab to platinum based chemotherapy improved progression free survival in patients with EGFR mutations and prior progression on Tagrisso.  However given patient's age I am concerned for side effects including infusion reactions with Amivantamab.  Mild leukopenia with a white cell count of 3.6.  Neutrophil count is 2.6.  I will hold off on giving him any Udenyca with this cycle but if there is a progressive drop in his neutrophil count we will add it for subsequent cycles.   Visit Diagnosis 1.  Primary cancer of left upper lobe of lung   2. Encounter for antineoplastic chemotherapy      Dr. Randa Evens, MD, MPH [facility] at [facility] [id number] 01/19/2023 12:46 PM

## 2023-01-19 NOTE — Addendum Note (Signed)
Addended by: Luella Cook on: 01/19/2023 01:19 PM   Modules accepted: Orders

## 2023-01-19 NOTE — Patient Instructions (Signed)
[facility] [place] [facility]  Discharge Instructions: Thank you for choosing [facility] to provide your oncology and hematology care.  If you have a lab appointment with the Cancer Center, please go directly to the Cancer Center and check in at the registration area.  Wear comfortable clothing and clothing appropriate for easy access to any Portacath or PICC line.   We strive to give you quality time with your provider. You may need to reschedule your appointment if you arrive late (15 or more minutes).  Arriving late affects you and other patients whose appointments are after yours.  Also, if you miss three or more appointments without notifying the office, you may be dismissed from the clinic at the provider's discretion.      For prescription refill requests, have your pharmacy contact our office and allow 72 hours for refills to be completed.    Today you received the following chemotherapy and/or immunotherapy agents Alimta and Carboplatin       To help prevent nausea and vomiting after your treatment, we encourage you to take your nausea medication as directed.  BELOW ARE SYMPTOMS THAT SHOULD BE REPORTED IMMEDIATELY: *FEVER GREATER THAN 100.4 F (38 C) OR HIGHER *CHILLS OR SWEATING *NAUSEA AND VOMITING THAT IS NOT CONTROLLED WITH YOUR NAUSEA MEDICATION *UNUSUAL SHORTNESS OF BREATH *UNUSUAL BRUISING OR BLEEDING *URINARY PROBLEMS (pain or burning when urinating, or frequent urination) *BOWEL PROBLEMS (unusual diarrhea, constipation, pain near the anus) TENDERNESS IN MOUTH AND THROAT WITH OR WITHOUT PRESENCE OF ULCERS (sore throat, sores in mouth, or a toothache) UNUSUAL RASH, SWELLING OR PAIN  UNUSUAL VAGINAL DISCHARGE OR ITCHING   Items with * indicate a potential emergency and should be followed up as soon as possible or go to the Emergency Department if any problems should occur.  Please show the CHEMOTHERAPY ALERT CARD or IMMUNOTHERAPY ALERT CARD  at check-in to the Emergency Department and triage nurse.  Should you have questions after your visit or need to cancel or reschedule your appointment, please contact [facility] [place] [facility]  [phone] and follow the prompts.  Office hours are 8:00 a.m. to 4:30 p.m. Monday - Friday. Please note that voicemails left after 4:00 p.m. may not be returned until the following business day.  We are closed weekends and major holidays. You have access to a nurse at all times for urgent questions. Please call the main number to the clinic [phone] and follow the prompts.  For any non-urgent questions, you may also contact your provider using MyChart. We now offer e-Visits for anyone 18 and older to request care online for non-urgent symptoms. For details visit [email or web address].   Also download the MyChart app! Go to the app store, search "MyChart", open the app, select [facility], and log in with your MyChart username and password.    

## 2023-01-24 ENCOUNTER — Telehealth: Payer: Medicare Other | Admitting: Hospice and Palliative Medicine

## 2023-01-28 ENCOUNTER — Inpatient Hospital Stay: Payer: Medicare Other

## 2023-01-28 ENCOUNTER — Other Ambulatory Visit: Payer: No Typology Code available for payment source

## 2023-01-28 DIAGNOSIS — C3412 Malignant neoplasm of upper lobe, left bronchus or lung: Secondary | ICD-10-CM

## 2023-01-28 DIAGNOSIS — Z5111 Encounter for antineoplastic chemotherapy: Secondary | ICD-10-CM | POA: Diagnosis not present

## 2023-01-28 LAB — BASIC METABOLIC PANEL
Anion gap: 7 (ref 5–15)
BUN: 20 mg/dL (ref 8–23)
CO2: 29 mmol/L (ref 22–32)
Calcium: 8.8 mg/dL — ABNORMAL LOW (ref 8.9–10.3)
Chloride: 98 mmol/L (ref 98–111)
Creatinine, Ser: 0.66 mg/dL (ref 0.61–1.24)
GFR, Estimated: >60 mL/min (ref >60)
Glucose, Bld: 131 mg/dL — ABNORMAL HIGH (ref 70–99)
Potassium: 3.9 mmol/L (ref 3.5–5.1)
Sodium: 134 mmol/L — ABNORMAL LOW (ref 135–145)

## 2023-01-28 MED ORDER — SODIUM CHLORIDE 0.9 % IV SOLN
INTRAVENOUS | Status: DC
  Filled 2023-01-28 (×2): qty 250

## 2023-01-28 MED ORDER — HEPARIN SOD (PORK) LOCK FLUSH 100 UNIT/ML IV SOLN
500.0000 [IU] | Freq: Once | INTRAVENOUS | Status: AC
  Administered 2023-01-28: 500 [IU] via INTRAVENOUS
  Filled 2023-01-28: qty 5

## 2023-01-28 MED ORDER — SODIUM CHLORIDE 0.9% FLUSH
10.0000 mL | Freq: Once | INTRAVENOUS | Status: AC
  Administered 2023-01-28: 10 mL via INTRAVENOUS
  Filled 2023-01-28: qty 10

## 2023-02-03 ENCOUNTER — Inpatient Hospital Stay (HOSPITAL_BASED_OUTPATIENT_CLINIC_OR_DEPARTMENT_OTHER): Payer: Medicare Other | Admitting: Hospice and Palliative Medicine

## 2023-02-03 DIAGNOSIS — C3412 Malignant neoplasm of upper lobe, left bronchus or lung: Secondary | ICD-10-CM

## 2023-02-03 NOTE — Progress Notes (Signed)
Called and spoke with his daughter, French Ana.  Patient was sleeping.  Daughter verbalized concern that patient is declining and has not tolerated chemotherapy well.  She reports that he is weak, not eating well, and sleeping frequently during the day.  Discussed option of bringing patient in the clinic to check labs and give fluids but daughter says that she would prefer just to keep appointment as scheduled next week to see Dr. Smith Robert.  Daughter asked if it was time to consider hospice, which we discussed in detail.  She plans to speak with Dr. Smith Robert about hospice next week.

## 2023-02-08 MED FILL — Fosaprepitant Dimeglumine For IV Infusion 150 MG (Base Eq): INTRAVENOUS | Qty: 5 | Status: AC

## 2023-02-08 MED FILL — Dexamethasone Sodium Phosphate Inj 100 MG/10ML: INTRAMUSCULAR | Qty: 1 | Status: AC

## 2023-02-09 ENCOUNTER — Inpatient Hospital Stay: Payer: Medicare Other

## 2023-02-09 ENCOUNTER — Inpatient Hospital Stay (HOSPITAL_BASED_OUTPATIENT_CLINIC_OR_DEPARTMENT_OTHER): Payer: Medicare Other | Admitting: Oncology

## 2023-02-09 ENCOUNTER — Encounter: Payer: Self-pay | Admitting: Oncology

## 2023-02-09 VITALS — BP 107/70 | HR 84 | Temp 95.1°F | Resp 16 | Ht 68.0 in | Wt 143.0 lb

## 2023-02-09 DIAGNOSIS — C3412 Malignant neoplasm of upper lobe, left bronchus or lung: Secondary | ICD-10-CM

## 2023-02-09 DIAGNOSIS — Z7189 Other specified counseling: Secondary | ICD-10-CM

## 2023-02-09 DIAGNOSIS — R5383 Other fatigue: Secondary | ICD-10-CM

## 2023-02-09 DIAGNOSIS — T451X5A Adverse effect of antineoplastic and immunosuppressive drugs, initial encounter: Secondary | ICD-10-CM

## 2023-02-09 DIAGNOSIS — Z5111 Encounter for antineoplastic chemotherapy: Secondary | ICD-10-CM | POA: Diagnosis not present

## 2023-02-09 LAB — CBC WITH DIFFERENTIAL (CANCER CENTER ONLY)
Abs Immature Granulocytes: 0.01 10*3/uL (ref 0.00–0.07)
Basophils Absolute: 0 10*3/uL (ref 0.0–0.1)
Basophils Relative: 1 %
Eosinophils Absolute: 0.2 10*3/uL (ref 0.0–0.5)
Eosinophils Relative: 5 %
HCT: 33.3 % — ABNORMAL LOW (ref 39.0–52.0)
Hemoglobin: 11.2 g/dL — ABNORMAL LOW (ref 13.0–17.0)
Immature Granulocytes: 0 %
Lymphocytes Relative: 16 %
Lymphs Abs: 0.6 10*3/uL — ABNORMAL LOW (ref 0.7–4.0)
MCH: 31.5 pg (ref 26.0–34.0)
MCHC: 33.6 g/dL (ref 30.0–36.0)
MCV: 93.8 fL (ref 80.0–100.0)
Monocytes Absolute: 0.7 10*3/uL (ref 0.1–1.0)
Monocytes Relative: 19 %
Neutro Abs: 2.1 10*3/uL (ref 1.7–7.7)
Neutrophils Relative %: 59 %
Platelet Count: 224 10*3/uL (ref 150–400)
RBC: 3.55 MIL/uL — ABNORMAL LOW (ref 4.22–5.81)
RDW: 14.9 % (ref 11.5–15.5)
WBC Count: 3.5 10*3/uL — ABNORMAL LOW (ref 4.0–10.5)
nRBC: 0 % (ref 0.0–0.2)

## 2023-02-09 LAB — CMP (CANCER CENTER ONLY)
ALT: 14 U/L (ref 0–44)
AST: 22 U/L (ref 15–41)
Albumin: 3.4 g/dL — ABNORMAL LOW (ref 3.5–5.0)
Alkaline Phosphatase: 96 U/L (ref 38–126)
Anion gap: 7 (ref 5–15)
BUN: 18 mg/dL (ref 8–23)
CO2: 28 mmol/L (ref 22–32)
Calcium: 9.1 mg/dL (ref 8.9–10.3)
Chloride: 101 mmol/L (ref 98–111)
Creatinine: 0.54 mg/dL — ABNORMAL LOW (ref 0.61–1.24)
GFR, Estimated: >60 mL/min (ref >60)
Glucose, Bld: 121 mg/dL — ABNORMAL HIGH (ref 70–99)
Potassium: 3.7 mmol/L (ref 3.5–5.1)
Sodium: 136 mmol/L (ref 135–145)
Total Bilirubin: 0.5 mg/dL (ref 0.3–1.2)
Total Protein: 6.6 g/dL (ref 6.5–8.1)

## 2023-02-09 LAB — IRON AND TIBC
Iron: 60 ug/dL (ref 45–182)
Saturation Ratios: 27 % (ref 17.9–39.5)
TIBC: 223 ug/dL — ABNORMAL LOW (ref 250–450)
UIBC: 163 ug/dL

## 2023-02-09 MED ORDER — SODIUM CHLORIDE 0.9 % IV SOLN
10.0000 mg | Freq: Once | INTRAVENOUS | Status: AC
  Administered 2023-02-09: 10 mg via INTRAVENOUS
  Filled 2023-02-09: qty 10

## 2023-02-09 MED ORDER — CYANOCOBALAMIN 1000 MCG/ML IJ SOLN
1000.0000 ug | Freq: Once | INTRAMUSCULAR | Status: AC
  Administered 2023-02-09: 1000 ug via INTRAMUSCULAR
  Filled 2023-02-09: qty 1

## 2023-02-09 MED ORDER — HEPARIN SOD (PORK) LOCK FLUSH 100 UNIT/ML IV SOLN
500.0000 [IU] | Freq: Once | INTRAVENOUS | Status: AC
  Filled 2023-02-09: qty 5

## 2023-02-09 MED ORDER — HEPARIN SOD (PORK) LOCK FLUSH 100 UNIT/ML IV SOLN
INTRAVENOUS | Status: AC
  Administered 2023-02-09: 500 [IU] via INTRAVENOUS
  Filled 2023-02-09: qty 5

## 2023-02-09 MED ORDER — SODIUM CHLORIDE 0.9 % IV SOLN
Freq: Once | INTRAVENOUS | Status: AC
  Filled 2023-02-09: qty 250

## 2023-02-09 NOTE — Progress Notes (Signed)
Hematology/Oncology Consult note [facility]  [phone] [phone]  Patient Care Team: Malva Limes, MD as PCP - General (Family Medicine) Mariah Milling Tollie Pizza, MD as PCP - Cardiology (Cardiology) Marcine Matar, MD as Consulting Physician (Urology) Glory Buff, RN as Oncology Nurse Navigator   Name of the patient: Jared Mullen  [id number]  06-Apr-1941   Date of visit: 02/09/23  Diagnosis- metastatic EGFR positive lung cancer      Chief complaint/ Reason for visit-on treatment assessment prior to cycle 3 of carbo Alimta chemotherapy  Heme/Onc history:  Patient is a 82 year old male who was admitted to the hospital in January 2023 with symptoms of generalized weakness cough and ongoing weight loss.  CT chest showed left perihilar upper lobe masslike opacity measuring 6.6 x 5 x 6.1 cm contiguous with the hilum.  Left hilar adenopathy as well as paratracheal adenopathy.  Severe narrowing of the left upper lobe bronchus due to left perihilar mass.  CT abdomen and pelvis with contrast showed large lytic lesion involving the right iliac bone.  MRI brain with and without contrast did not show any evidence of metastatic disease.  Patient underwent bronchoscopy with biopsy of the left hilar mass which was consistent with adenocarcinoma.   NGS testing showed EGFR L858R mutation that could be targetable with Tagrisso.  Tumor mutational burden high.  PD-L1 less than 1%.  No other actionable mutations.  Patient has been on Tagrisso since January 2023.  Progression of disease and February 2024 as evidenced by increase in liver metastases as well as mixed response to mediastinal adenopathy.  Patient switched to [place] Alimta chemotherapy     Interval history-when patient was on Tagrisso he had a better quality of life and he was able to walk around the house as well as do some outside yard work.  States that his fatigue is increased with [place] Alimta  chemotherapy and he spends most of the time resting or watching TV but unable to walk around much.  He does not have any significant nausea or vomiting.  Denies any significant pain  ECOG PS- 2-3 Pain scale- 0 Opioid associated constipation- no  Review of systems- Review of Systems  Constitutional:  Positive for malaise/fatigue.      No Known Allergies   Past Medical History:  Diagnosis Date   Abnormal ECG    a. baseline inferolateral ST elevation dating back to at least 2012.   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   Sleep apnea    Urinary frequency    Weak urinary stream    Weight loss      Past Surgical History:  Procedure Laterality Date   biospy  09/30/2011-   TRUS/Bx Prostate - 5/12/biopsies positive for cancer, glandular size 32.5 cc   CHOLECYSTECTOMY  1992   IR IMAGING GUIDED PORT INSERTION  12/22/2022   RADIOACTIVE SEED IMPLANT  02/09/2012   Procedure: RADIOACTIVE SEED IMPLANT;  Surgeon: Marcine Matar, MD;  Location: [facility];  Service: Urology;  Laterality: N/A;  RAD TECH OK PER ANNE AT MAIN OR    UVULOPALATOPHARYNGOPLASTY     VIDEO BRONCHOSCOPY WITH ENDOBRONCHIAL ULTRASOUND Bilateral 11/12/2021   Procedure: VIDEO BRONCHOSCOPY WITH ENDOBRONCHIAL ULTRASOUND;  Surgeon: Vida Rigger, MD;  Location: [facility];  Service: Thoracic;  Laterality: Bilateral;  Social History   Socioeconomic History   Marital status: Widowed    Spouse name: Not on file   Number of children: 3   Years of education: Not on file   Highest education level: High school graduate  Occupational History   Occupation: retired  Tobacco Use   Smoking status: Former    Packs/day: 2.00    Years: 28.00    Additional pack years: 0.00    Total pack years: 56.00    Types: Cigarettes    Quit date: 10/18/1980    Years since quitting: 42.3    Smokeless tobacco: Never  Vaping Use   Vaping Use: Never used  Substance and Sexual Activity   Alcohol use: No   Drug use: No   Sexual activity: Not on file    Comment: Low libido  Other Topics Concern   Not on file  Social History Narrative   Lives locally.  Was caring for his wife until she passed in December.  Sedentary.   Social Determinants of Health   Financial Resource Strain: Low Risk  (05/14/2019)   Overall Financial Resource Strain (CARDIA)    Difficulty of Paying Living Expenses: Not hard at all  Food Insecurity: No Food Insecurity (05/14/2019)   Hunger Vital Sign    Worried About Running Out of Food in the Last Year: Never true    Ran Out of Food in the Last Year: Never true  Transportation Needs: No Transportation Needs (05/14/2019)   PRAPARE - Administrator, Civil Service (Medical): No    Lack of Transportation (Non-Medical): No  Physical Activity: Inactive (05/14/2019)   Exercise Vital Sign    Days of Exercise per Week: 0 days    Minutes of Exercise per Session: 0 min  Stress: No Stress Concern Present (05/14/2019)   [employer] of Occupational Health - Occupational Stress Questionnaire    Feeling of Stress : Not at all  Social Connections: Unknown (05/14/2019)   Social Connection and Isolation Panel [NHANES]    Frequency of Communication with Friends and Family: Patient declined    Frequency of Social Gatherings with Friends and Family: Patient declined    Attends Religious Services: Patient declined    Database administrator or Organizations: Patient declined    Attends Banker Meetings: Patient declined    Marital Status: Patient declined  Intimate Partner Violence: Unknown (05/14/2019)   Humiliation, Afraid, Rape, and Kick questionnaire    Fear of Current or Ex-Partner: Patient declined    Emotionally Abused: Patient declined    Physically Abused: Patient declined    Sexually Abused: Patient declined    Family History   Problem Relation Age of Onset   Heart disease Father    Breast cancer Sister    Cancer Sister        lung   Lung cancer Brother    Cancer Brother        bladder   Lung cancer Sister    Cancer Sister        breast   Kidney disease Brother      Current Outpatient Medications:    acetaminophen (TYLENOL) 325 MG tablet, Take 650 mg by mouth every 6 (six) hours as needed., Disp: , Rfl:    dexamethasone (DECADRON) 4 MG tablet, Take 1 tab 2 times daily starting day before pemetrexed. Then take 2 tabs daily x 3 days starting day after carboplatin. Take with food., Disp: 30 tablet, Rfl: 1   fluocinonide (LIDEX) 0.05 %  external solution, APPLY SMALL AMOUNT TOPICALLY EVERY DAY AS NEEDED TO RASH ON SCALP; USE ONLY ON RASH AND ONLY WHEN ITCHES; DO NOT USE ON  FACE OR GENITALS TO RASH ON SCALP; USE ONLY ON RASH AND ONLY WHEN ITCHES; DO NOT USE ON  FACE OR GENITALS, Disp: , Rfl:    folic acid (FOLVITE) 1 MG tablet, Take 1 tablet (1 mg total) by mouth daily. Start 7 days before pemetrexed chemotherapy. Continue until 21 days after pemetrexed completed., Disp: 100 tablet, Rfl: 3   lidocaine-prilocaine (EMLA) cream, Apply 1 Application topically as needed. Apply 1 hour prior to port a cath access, Disp: 30 g, Rfl: 3   ondansetron (ZOFRAN) 8 MG tablet, Take 1 tablet (8 mg total) by mouth every 8 (eight) hours as needed for nausea or vomiting. Start on the third day after carboplatin., Disp: 30 tablet, Rfl: 1   prochlorperazine (COMPAZINE) 10 MG tablet, Take 1 tablet (10 mg total) by mouth every 6 (six) hours as needed for nausea or vomiting., Disp: 30 tablet, Rfl: 1   selenium sulfide (SELSUN) 2.5 % lotion, APPLY SMALL AMOUNT (LOTION/SHAMPOO) TOPICALLY EVERY DAY AS NEEDED LATHER ONTO SCALP N SHOWER; LEAVE ON FOR SEVERAL MINUTES, RINSE; USE WHEN  YOU SHOWER LATHER ONTO SCALP N SHOWER; LEAVE ON FOR SEVERAL MINUTES, RINSE; USE WHEN   YOU SHOWER, Disp: , Rfl:    triamcinolone ointment (KENALOG) 0.5 %, Apply 1  Application topically 2 (two) times daily., Disp: 30 g, Rfl: 1  Physical exam:  Vitals:   02/09/23 0904  BP: 107/70  Pulse: 84  Resp: 16  Temp: (!) 95.1 F (35.1 C)  TempSrc: Tympanic  SpO2: 100%  Weight: 143 lb (64.9 kg)  Height:  (1.727 m)   Physical Exam Constitutional:      Comments: Sitting in a wheelchair.  Appears fatigued  Cardiovascular:     Rate and Rhythm: Normal rate and regular rhythm.     Heart sounds: Normal heart sounds.  Pulmonary:     Effort: Pulmonary effort is normal.     Breath sounds: Normal breath sounds.  Abdominal:     General: Bowel sounds are normal.     Palpations: Abdomen is soft.  Skin:    General: Skin is warm and dry.  Neurological:     Mental Status: He is alert and oriented to person, place, and time.         Latest Ref Rng & Units 02/09/2023    8:54 AM  CMP  Glucose 70 - 99 mg/dL 161   BUN 8 - 23 mg/dL 18   Creatinine 0.96 - 1.24 mg/dL 0.45   Sodium 409 - 811 mmol/L 136   Potassium 3.5 - 5.1 mmol/L 3.7   Chloride 98 - 111 mmol/L 101   CO2 22 - 32 mmol/L 28   Calcium 8.9 - 10.3 mg/dL 9.1   Total Protein 6.5 - 8.1 g/dL 6.6   Total Bilirubin 0.3 - 1.2 mg/dL 0.5   Alkaline Phos 38 - 126 U/L 96   AST 15 - 41 U/L 22   ALT 0 - 44 U/L 14       Latest Ref Rng & Units 02/09/2023    8:54 AM  CBC  WBC 4.0 - 10.5 K/uL 3.5   Hemoglobin 13.0 - 17.0 g/dL 91.4   Hematocrit 78.2 - 52.0 % 33.3   Platelets 150 - 400 K/uL 224      Assessment and plan- Patient is a 82 y.o. male with  metastatic EGFR positive lung cancer with progression on Tagrisso.  He is here for on treatment assessment prior to cycle 3 of carbo Alimta chemotherapy  Patient has had worsening fatigue with [place] Alimta chemotherapy.  Counts have remained stable.  I will hold off on giving him chemo therapy today given his age.  I will plan to repeat CT chest abdomen pelvis with contrast and bone scan in 2 weeks.  He will receive 1 L of IV fluids today along with 10 mg  of IV Decadron and we will do this weekly.  We will see if his performance status improves after a break from chemotherapy.  I will see him back in 3 weeks and depending on scans and performance status I will have a discussion whether he wants to proceed with hospice versus trying single agent Alimta.  Patient and his daughter are agreeable with this plan   Visit Diagnosis 1. Primary cancer of left upper lobe of lung   2. Chemotherapy-induced fatigue      Dr. Owens Shark, MD, MPH [facility] [phone] 02/09/2023 9:37 AM

## 2023-02-09 NOTE — Addendum Note (Signed)
Addended by: Corene Cornea on: 02/09/2023 10:38 AM   Modules accepted: Orders

## 2023-02-15 ENCOUNTER — Ambulatory Visit
Admission: RE | Admit: 2023-02-15 | Discharge: 2023-02-15 | Disposition: A | Discharge status: Home / Self Care | Payer: Medicare Other | Source: Ambulatory Visit | Attending: Oncology | Admitting: Oncology

## 2023-02-15 DIAGNOSIS — C3412 Malignant neoplasm of upper lobe, left bronchus or lung: Secondary | ICD-10-CM

## 2023-02-15 DIAGNOSIS — R5383 Other fatigue: Secondary | ICD-10-CM | POA: Insufficient documentation

## 2023-02-15 DIAGNOSIS — T451X5A Adverse effect of antineoplastic and immunosuppressive drugs, initial encounter: Secondary | ICD-10-CM | POA: Diagnosis present

## 2023-02-15 MED ORDER — IOHEXOL 300 MG/ML  SOLN
100.0000 mL | Freq: Once | INTRAMUSCULAR | Status: AC | PRN
  Administered 2023-02-15: 100 mL via INTRAVENOUS

## 2023-02-15 MED ORDER — HEPARIN SOD (PORK) LOCK FLUSH 100 UNIT/ML IV SOLN
INTRAVENOUS | Status: AC
  Filled 2023-02-15: qty 5

## 2023-02-16 ENCOUNTER — Inpatient Hospital Stay: Payer: Medicare Other | Attending: Hospice and Palliative Medicine

## 2023-02-16 VITALS — BP 119/78 | HR 76 | Temp 96.8°F | Resp 20

## 2023-02-16 DIAGNOSIS — Z8546 Personal history of malignant neoplasm of prostate: Secondary | ICD-10-CM | POA: Diagnosis not present

## 2023-02-16 DIAGNOSIS — Z79899 Other long term (current) drug therapy: Secondary | ICD-10-CM | POA: Insufficient documentation

## 2023-02-16 DIAGNOSIS — Z8052 Family history of malignant neoplasm of bladder: Secondary | ICD-10-CM | POA: Diagnosis not present

## 2023-02-16 DIAGNOSIS — Z803 Family history of malignant neoplasm of breast: Secondary | ICD-10-CM | POA: Insufficient documentation

## 2023-02-16 DIAGNOSIS — G4730 Sleep apnea, unspecified: Secondary | ICD-10-CM | POA: Insufficient documentation

## 2023-02-16 DIAGNOSIS — Z5111 Encounter for antineoplastic chemotherapy: Secondary | ICD-10-CM | POA: Diagnosis present

## 2023-02-16 DIAGNOSIS — C3412 Malignant neoplasm of upper lobe, left bronchus or lung: Secondary | ICD-10-CM | POA: Diagnosis not present

## 2023-02-16 DIAGNOSIS — I700 Atherosclerosis of aorta: Secondary | ICD-10-CM | POA: Insufficient documentation

## 2023-02-16 DIAGNOSIS — C7951 Secondary malignant neoplasm of bone: Secondary | ICD-10-CM | POA: Diagnosis not present

## 2023-02-16 DIAGNOSIS — Z87891 Personal history of nicotine dependence: Secondary | ICD-10-CM | POA: Diagnosis not present

## 2023-02-16 DIAGNOSIS — Z801 Family history of malignant neoplasm of trachea, bronchus and lung: Secondary | ICD-10-CM | POA: Diagnosis not present

## 2023-02-16 MED ORDER — SODIUM CHLORIDE 0.9 % IV SOLN
INTRAVENOUS | Status: DC
  Filled 2023-02-16 (×2): qty 250

## 2023-02-16 MED ORDER — SODIUM CHLORIDE 0.9 % IV SOLN
10.0000 mg | Freq: Once | INTRAVENOUS | Status: AC
  Administered 2023-02-16: 10 mg via INTRAVENOUS
  Filled 2023-02-16: qty 10

## 2023-02-16 MED ORDER — HEPARIN SOD (PORK) LOCK FLUSH 100 UNIT/ML IV SOLN
500.0000 [IU] | Freq: Once | INTRAVENOUS | Status: AC
  Administered 2023-02-16: 500 [IU] via INTRAVENOUS
  Filled 2023-02-16: qty 5

## 2023-02-16 MED ORDER — SODIUM CHLORIDE 0.9% FLUSH
10.0000 mL | Freq: Once | INTRAVENOUS | Status: AC
  Administered 2023-02-16: 10 mL via INTRAVENOUS
  Filled 2023-02-16: qty 10

## 2023-02-21 ENCOUNTER — Encounter
Admission: RE | Admit: 2023-02-21 | Discharge: 2023-02-21 | Disposition: A | Discharge status: Home / Self Care | Payer: Medicare Other | Source: Ambulatory Visit | Attending: Oncology | Admitting: Oncology

## 2023-02-21 DIAGNOSIS — C3412 Malignant neoplasm of upper lobe, left bronchus or lung: Secondary | ICD-10-CM | POA: Diagnosis present

## 2023-02-21 DIAGNOSIS — T451X5A Adverse effect of antineoplastic and immunosuppressive drugs, initial encounter: Secondary | ICD-10-CM | POA: Diagnosis present

## 2023-02-21 DIAGNOSIS — R5383 Other fatigue: Secondary | ICD-10-CM | POA: Insufficient documentation

## 2023-02-21 MED ORDER — TECHNETIUM TC 99M MEDRONATE IV KIT
20.0000 | PACK | Freq: Once | INTRAVENOUS | Status: AC | PRN
  Administered 2023-02-21: 20.16 via INTRAVENOUS

## 2023-02-23 ENCOUNTER — Inpatient Hospital Stay: Payer: Medicare Other

## 2023-02-23 VITALS — BP 114/79 | HR 85 | Temp 96.8°F | Resp 20

## 2023-02-23 DIAGNOSIS — C3412 Malignant neoplasm of upper lobe, left bronchus or lung: Secondary | ICD-10-CM

## 2023-02-23 DIAGNOSIS — Z5111 Encounter for antineoplastic chemotherapy: Secondary | ICD-10-CM | POA: Diagnosis not present

## 2023-02-23 MED ORDER — SODIUM CHLORIDE 0.9 % IV SOLN
10.0000 mg | Freq: Once | INTRAVENOUS | Status: AC
  Administered 2023-02-23: 10 mg via INTRAVENOUS
  Filled 2023-02-23: qty 1

## 2023-02-23 MED ORDER — SODIUM CHLORIDE 0.9 % IV SOLN
INTRAVENOUS | Status: DC
  Filled 2023-02-23 (×2): qty 250

## 2023-02-23 MED ORDER — HEPARIN SOD (PORK) LOCK FLUSH 100 UNIT/ML IV SOLN
500.0000 [IU] | Freq: Once | INTRAVENOUS | Status: AC
  Administered 2023-02-23: 500 [IU] via INTRAVENOUS
  Filled 2023-02-23: qty 5

## 2023-02-23 MED ORDER — SODIUM CHLORIDE 0.9% FLUSH
10.0000 mL | Freq: Once | INTRAVENOUS | Status: AC
  Administered 2023-02-23: 10 mL via INTRAVENOUS
  Filled 2023-02-23: qty 10

## 2023-03-01 ENCOUNTER — Other Ambulatory Visit: Payer: Self-pay | Admitting: *Deleted

## 2023-03-01 DIAGNOSIS — R5383 Other fatigue: Secondary | ICD-10-CM

## 2023-03-01 DIAGNOSIS — C3412 Malignant neoplasm of upper lobe, left bronchus or lung: Secondary | ICD-10-CM

## 2023-03-02 ENCOUNTER — Inpatient Hospital Stay: Payer: Medicare Other

## 2023-03-02 ENCOUNTER — Encounter: Payer: Self-pay | Admitting: Oncology

## 2023-03-02 ENCOUNTER — Inpatient Hospital Stay (HOSPITAL_BASED_OUTPATIENT_CLINIC_OR_DEPARTMENT_OTHER): Payer: Medicare Other | Admitting: Oncology

## 2023-03-02 ENCOUNTER — Ambulatory Visit: Payer: No Typology Code available for payment source | Admitting: Oncology

## 2023-03-02 ENCOUNTER — Ambulatory Visit: Payer: No Typology Code available for payment source

## 2023-03-02 ENCOUNTER — Other Ambulatory Visit: Payer: No Typology Code available for payment source

## 2023-03-02 VITALS — BP 107/68 | HR 73 | Temp 97.9°F | Resp 18 | Ht 68.0 in | Wt 150.0 lb

## 2023-03-02 DIAGNOSIS — C3412 Malignant neoplasm of upper lobe, left bronchus or lung: Secondary | ICD-10-CM

## 2023-03-02 DIAGNOSIS — Z7189 Other specified counseling: Secondary | ICD-10-CM

## 2023-03-02 DIAGNOSIS — R5383 Other fatigue: Secondary | ICD-10-CM

## 2023-03-02 DIAGNOSIS — T451X5A Adverse effect of antineoplastic and immunosuppressive drugs, initial encounter: Secondary | ICD-10-CM

## 2023-03-02 DIAGNOSIS — Z5111 Encounter for antineoplastic chemotherapy: Secondary | ICD-10-CM | POA: Diagnosis not present

## 2023-03-02 LAB — CBC WITH DIFFERENTIAL (CANCER CENTER ONLY)
Abs Immature Granulocytes: 0.04 10*3/uL (ref 0.00–0.07)
Basophils Absolute: 0.1 10*3/uL (ref 0.0–0.1)
Basophils Relative: 1 %
Eosinophils Absolute: 0.7 10*3/uL — ABNORMAL HIGH (ref 0.0–0.5)
Eosinophils Relative: 9 %
HCT: 35.2 % — ABNORMAL LOW (ref 39.0–52.0)
Hemoglobin: 11.7 g/dL — ABNORMAL LOW (ref 13.0–17.0)
Immature Granulocytes: 1 %
Lymphocytes Relative: 9 %
Lymphs Abs: 0.7 10*3/uL (ref 0.7–4.0)
MCH: 32 pg (ref 26.0–34.0)
MCHC: 33.2 g/dL (ref 30.0–36.0)
MCV: 96.2 fL (ref 80.0–100.0)
Monocytes Absolute: 1 10*3/uL (ref 0.1–1.0)
Monocytes Relative: 13 %
Neutro Abs: 5.2 10*3/uL (ref 1.7–7.7)
Neutrophils Relative %: 67 %
Platelet Count: 200 10*3/uL (ref 150–400)
RBC: 3.66 MIL/uL — ABNORMAL LOW (ref 4.22–5.81)
RDW: 15.9 % — ABNORMAL HIGH (ref 11.5–15.5)
WBC Count: 7.7 10*3/uL (ref 4.0–10.5)
nRBC: 0 % (ref 0.0–0.2)

## 2023-03-02 LAB — CMP (CANCER CENTER ONLY)
ALT: 18 U/L (ref 0–44)
AST: 25 U/L (ref 15–41)
Albumin: 3.5 g/dL (ref 3.5–5.0)
Alkaline Phosphatase: 93 U/L (ref 38–126)
Anion gap: 10 (ref 5–15)
BUN: 24 mg/dL — ABNORMAL HIGH (ref 8–23)
CO2: 27 mmol/L (ref 22–32)
Calcium: 9.2 mg/dL (ref 8.9–10.3)
Chloride: 99 mmol/L (ref 98–111)
Creatinine: 0.69 mg/dL (ref 0.61–1.24)
GFR, Estimated: >60 mL/min (ref >60)
Glucose, Bld: 136 mg/dL — ABNORMAL HIGH (ref 70–99)
Potassium: 4 mmol/L (ref 3.5–5.1)
Sodium: 136 mmol/L (ref 135–145)
Total Bilirubin: 0.5 mg/dL (ref 0.3–1.2)
Total Protein: 6.8 g/dL (ref 6.5–8.1)

## 2023-03-02 LAB — IRON AND TIBC
Iron: 52 ug/dL (ref 45–182)
Saturation Ratios: 22 % (ref 17.9–39.5)
TIBC: 234 ug/dL — ABNORMAL LOW (ref 250–450)
UIBC: 182 ug/dL

## 2023-03-02 LAB — FERRITIN: Ferritin: 508 ng/mL — ABNORMAL HIGH (ref 24–336)

## 2023-03-02 MED ORDER — SODIUM CHLORIDE 0.9 % IV SOLN
Freq: Once | INTRAVENOUS | Status: AC
  Filled 2023-03-02: qty 250

## 2023-03-02 MED ORDER — HEPARIN SOD (PORK) LOCK FLUSH 100 UNIT/ML IV SOLN
500.0000 [IU] | Freq: Once | INTRAVENOUS | Status: AC
  Administered 2023-03-02: 500 [IU] via INTRAVENOUS
  Filled 2023-03-02: qty 5

## 2023-03-02 NOTE — Progress Notes (Signed)
Hematology/Oncology Consult note [facility]  [phone] [phone]  Patient Care Team: Malva Limes, MD as PCP - General (Family Medicine) Mariah Milling Tollie Pizza, MD as PCP - Cardiology (Cardiology) Marcine Matar, MD as Consulting Physician (Urology) Glory Buff, RN as Oncology Nurse Navigator   Name of the patient: Jared Mullen  [id number]  Oct 21, 1940   Date of visit: 03/02/23  Diagnosis- metastatic EGFR positive lung cancer    Chief complaint/ Reason for visit-discuss CT scan results and further management  Heme/Onc history: Patient is a 82 year old male who was admitted to the hospital in January 2023 with symptoms of generalized weakness cough and ongoing weight loss.  CT chest showed left perihilar upper lobe masslike opacity measuring 6.6 x 5 x 6.1 cm contiguous with the hilum.  Left hilar adenopathy as well as paratracheal adenopathy.  Severe narrowing of the left upper lobe bronchus due to left perihilar mass.  CT abdomen and pelvis with contrast showed large lytic lesion involving the right iliac bone.  MRI brain with and without contrast did not show any evidence of metastatic disease.  Patient underwent bronchoscopy with biopsy of the left hilar mass which was consistent with adenocarcinoma.   NGS testing showed EGFR L858R mutation that could be targetable with Tagrisso.  Tumor mutational burden high.  PD-L1 less than 1%.  No other actionable mutations.  Patient has been on Tagrisso since January 2023.  Progression of disease and February 2024 as evidenced by increase in liver metastases as well as mixed response to mediastinal adenopathy.  Patient switched to [place] Alimta chemotherapy    Interval history-patient feels fatigued.  Denies any pain.  There are good days and bad days.  A few days ago patient was able to go out of the house and do some yard work.  ECOG PS- 3 Pain scale- 0 Opioid associated constipation-  no  Review of systems- Review of Systems  Constitutional:  Positive for malaise/fatigue. Negative for chills, fever and weight loss.  HENT:  Negative for congestion, ear discharge and nosebleeds.   Eyes:  Negative for blurred vision.  Respiratory:  Negative for cough, hemoptysis, sputum production, shortness of breath and wheezing.   Cardiovascular:  Negative for chest pain, palpitations, orthopnea and claudication.  Gastrointestinal:  Negative for abdominal pain, blood in stool, constipation, diarrhea, heartburn, melena, nausea and vomiting.  Genitourinary:  Negative for dysuria, flank pain, frequency, hematuria and urgency.  Musculoskeletal:  Negative for back pain, joint pain and myalgias.  Skin:  Negative for rash.  Neurological:  Negative for dizziness, tingling, focal weakness, seizures, weakness and headaches.  Endo/Heme/Allergies:  Does not bruise/bleed easily.  Psychiatric/Behavioral:  Negative for depression and suicidal ideas. The patient does not have insomnia.       No Known Allergies   Past Medical History:  Diagnosis Date   Abnormal ECG    a. baseline inferolateral ST elevation dating back to at least 2012.   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   Sleep apnea    Urinary frequency    Weak urinary stream    Weight loss      Past Surgical History:  Procedure Laterality Date   biospy  09/30/2011-   TRUS/Bx Prostate - 5/12/biopsies positive for cancer, glandular size 32.5 cc   CHOLECYSTECTOMY  1992  IR IMAGING GUIDED PORT INSERTION  12/22/2022   RADIOACTIVE SEED IMPLANT  02/09/2012   Procedure: RADIOACTIVE SEED IMPLANT;  Surgeon: Marcine Matar, MD;  Location: [facility];  Service: Urology;  Laterality: N/A;  RAD TECH OK PER ANNE AT MAIN OR    UVULOPALATOPHARYNGOPLASTY     VIDEO BRONCHOSCOPY WITH  ENDOBRONCHIAL ULTRASOUND Bilateral 11/12/2021   Procedure: VIDEO BRONCHOSCOPY WITH ENDOBRONCHIAL ULTRASOUND;  Surgeon: Vida Rigger, MD;  Location: [facility];  Service: Thoracic;  Laterality: Bilateral;    Social History   Socioeconomic History   Marital status: Widowed    Spouse name: Not on file   Number of children: 3   Years of education: Not on file   Highest education level: High school graduate  Occupational History   Occupation: retired  Tobacco Use   Smoking status: Former    Packs/day: 2.00    Years: 28.00    Additional pack years: 0.00    Total pack years: 56.00    Types: Cigarettes    Quit date: 10/18/1980    Years since quitting: 42.3   Smokeless tobacco: Never  Vaping Use   Vaping Use: Never used  Substance and Sexual Activity   Alcohol use: No   Drug use: No   Sexual activity: Not on file    Comment: Low libido  Other Topics Concern   Not on file  Social History Narrative   Lives locally.  Was caring for his wife until she passed in December.  Sedentary.   Social Determinants of Health   Financial Resource Strain: Low Risk  (05/14/2019)   Overall Financial Resource Strain (CARDIA)    Difficulty of Paying Living Expenses: Not hard at all  Food Insecurity: No Food Insecurity (05/14/2019)   Hunger Vital Sign    Worried About Running Out of Food in the Last Year: Never true    Ran Out of Food in the Last Year: Never true  Transportation Needs: No Transportation Needs (05/14/2019)   PRAPARE - Administrator, Civil Service (Medical): No    Lack of Transportation (Non-Medical): No  Physical Activity: Inactive (05/14/2019)   Exercise Vital Sign    Days of Exercise per Week: 0 days    Minutes of Exercise per Session: 0 min  Stress: No Stress Concern Present (05/14/2019)   [employer] of Occupational Health - Occupational Stress Questionnaire    Feeling of Stress : Not at all  Social Connections: Unknown (05/14/2019)   Social Connection and  Isolation Panel [NHANES]    Frequency of Communication with Friends and Family: Patient declined    Frequency of Social Gatherings with Friends and Family: Patient declined    Attends Religious Services: Patient declined    Database administrator or Organizations: Patient declined    Attends Banker Meetings: Patient declined    Marital Status: Patient declined  Intimate Partner Violence: Unknown (05/14/2019)   Humiliation, Afraid, Rape, and Kick questionnaire    Fear of Current or Ex-Partner: Patient declined    Emotionally Abused: Patient declined    Physically Abused: Patient declined    Sexually Abused: Patient declined    Family History  Problem Relation Age of Onset   Heart disease Father    Breast cancer Sister    Cancer Sister        lung   Lung cancer Brother    Cancer Brother        bladder   Lung cancer Sister    Cancer  Sister        breast   Kidney disease Brother      Current Outpatient Medications:    acetaminophen (TYLENOL) 325 MG tablet, Take 650 mg by mouth every 6 (six) hours as needed., Disp: , Rfl:    dexamethasone (DECADRON) 4 MG tablet, Take 1 tab 2 times daily starting day before pemetrexed. Then take 2 tabs daily x 3 days starting day after carboplatin. Take with food., Disp: 30 tablet, Rfl: 1   fluocinonide (LIDEX) 0.05 % external solution, APPLY SMALL AMOUNT TOPICALLY EVERY DAY AS NEEDED TO RASH ON SCALP; USE ONLY ON RASH AND ONLY WHEN ITCHES; DO NOT USE ON  FACE OR GENITALS TO RASH ON SCALP; USE ONLY ON RASH AND ONLY WHEN ITCHES; DO NOT USE ON  FACE OR GENITALS, Disp: , Rfl:    folic acid (FOLVITE) 1 MG tablet, Take 1 tablet (1 mg total) by mouth daily. Start 7 days before pemetrexed chemotherapy. Continue until 21 days after pemetrexed completed., Disp: 100 tablet, Rfl: 3   lidocaine-prilocaine (EMLA) cream, Apply 1 Application topically as needed. Apply 1 hour prior to port a cath access, Disp: 30 g, Rfl: 3   ondansetron (ZOFRAN) 8 MG  tablet, Take 1 tablet (8 mg total) by mouth every 8 (eight) hours as needed for nausea or vomiting. Start on the third day after carboplatin., Disp: 30 tablet, Rfl: 1   prochlorperazine (COMPAZINE) 10 MG tablet, Take 1 tablet (10 mg total) by mouth every 6 (six) hours as needed for nausea or vomiting., Disp: 30 tablet, Rfl: 1   selenium sulfide (SELSUN) 2.5 % lotion, APPLY SMALL AMOUNT (LOTION/SHAMPOO) TOPICALLY EVERY DAY AS NEEDED LATHER ONTO SCALP N SHOWER; LEAVE ON FOR SEVERAL MINUTES, RINSE; USE WHEN  YOU SHOWER LATHER ONTO SCALP N SHOWER; LEAVE ON FOR SEVERAL MINUTES, RINSE; USE WHEN   YOU SHOWER, Disp: , Rfl:    triamcinolone ointment (KENALOG) 0.5 %, Apply 1 Application topically 2 (two) times daily., Disp: 30 g, Rfl: 1  Physical exam:  Vitals:   03/02/23 1015  BP: 107/68  Pulse: 73  Resp: 18  Temp: 97.9 F (36.6 C)  TempSrc: Tympanic  SpO2: 100%  Weight: 150 lb (68 kg)  Height: 5\' 8"  (1.727 m)   Physical Exam Constitutional:      Comments: Sitting in a wheelchair.  Appears fatigued  Cardiovascular:     Rate and Rhythm: Normal rate and regular rhythm.     Heart sounds: Normal heart sounds.  Pulmonary:     Effort: Pulmonary effort is normal.     Breath sounds: Normal breath sounds.  Musculoskeletal:     Right lower leg: No edema.     Left lower leg: No edema.  Skin:    General: Skin is warm and dry.  Neurological:     Mental Status: He is alert and oriented to person, place, and time.         Latest Ref Rng & Units 03/02/2023    9:55 AM  CMP  Glucose 70 - 99 mg/dL 161   BUN 8 - 23 mg/dL 24   Creatinine 0.96 - 1.24 mg/dL 0.45   Sodium 409 - 811 mmol/L 136   Potassium 3.5 - 5.1 mmol/L 4.0   Chloride 98 - 111 mmol/L 99   CO2 22 - 32 mmol/L 27   Calcium 8.9 - 10.3 mg/dL 9.2   Total Protein 6.5 - 8.1 g/dL 6.8   Total Bilirubin 0.3 - 1.2 mg/dL 0.5  Alkaline Phos 38 - 126 U/L 93   AST 15 - 41 U/L 25   ALT 0 - 44 U/L 18       Latest Ref Rng & Units 03/02/2023     9:55 AM  CBC  WBC 4.0 - 10.5 K/uL 7.7   Hemoglobin 13.0 - 17.0 g/dL 29.5   Hematocrit 28.4 - 52.0 % 35.2   Platelets 150 - 400 K/uL 200     No images are attached to the encounter.  NM Bone Scan Whole Body  Result Date: 02/22/2023 CLINICAL DATA:  Metastatic LEFT upper lobe lung cancer, assess treatment response EXAM: NUCLEAR MEDICINE WHOLE BODY BONE SCAN TECHNIQUE: Whole body anterior and posterior images were obtained approximately 3 hours after intravenous injection of radiopharmaceutical. RADIOPHARMACEUTICALS:  20.16 mCi Technetium-32m MDP IV COMPARISON:  06/07/2022 Correlation: CT chest abdomen pelvis 02/15/2023 FINDINGS: Again identified uptake in the RIGHT iliac wing consistent with osseous metastasis. New focus of abnormal tracer uptake lateral RIGHT eighth rib, corresponding to destructive/lytic bone lesion on CT. No additional worrisome sites of osseous tracer accumulation are identified. Retained tracer within Port-A-Cath tubing in the RIGHT supraclavicular region. Degenerative type uptake at multiple adjacent costovertebral junctions at the upper RIGHT ribs, likely degenerative or traumatic. Expected urinary tract and soft tissue distribution of tracer. IMPRESSION: Again identified metastatic lesion RIGHT iliac bone. New metastatic lesion lateral RIGHT eighth rib. Electronically Signed   By: Ulyses Southward M.D.   On: 02/22/2023 16:46   CT CHEST ABDOMEN PELVIS W CONTRAST  Result Date: 02/16/2023 CLINICAL DATA:  Metastatic lung cancer restaging, osseous metastases, additional history prostate cancer * Tracking Code: BO * EXAM: CT CHEST, ABDOMEN, AND PELVIS WITH CONTRAST TECHNIQUE: Multidetector CT imaging of the chest, abdomen and pelvis was performed following the standard protocol during bolus administration of intravenous contrast. RADIATION DOSE REDUCTION: This exam was performed according to the departmental dose-optimization program which includes automated exposure control, adjustment of  the mA and/or kV according to patient size and/or use of iterative reconstruction technique. CONTRAST:  OMNIPAQUE IOHEXOL 300 MG/ML SOLN, additional oral enteric contrast COMPARISON:  CT chest abdomen pelvis, 11/08/2022 FINDINGS: CT CHEST FINDINGS Cardiovascular: Aortic atherosclerosis normal heart size. No pericardial effusion. Mediastinum/Nodes: Unchanged post treatment appearance of matted soft tissue about the left hilum and AP window (series 2, image 20, 26). No discretely enlarged mediastinal, hilar, or axillary lymph nodes. Thyroid gland, trachea, and esophagus demonstrate no significant findings. Lungs/Pleura: Unchanged post treatment appearance of a spiculated mass of the suprahilar left upper lobe, measuring 3.7 x 3.0 cm (series 2, image 22). Unchanged consolidation and volume loss of the peripheral left upper lobe with volume loss (series 4, image 31). Unchanged small satellite nodules in the left upper lobe, largest measuring 1.0 x 0.7 cm (series 4, image 37). Calcified bilateral pleural plaques. Musculoskeletal: No chest wall abnormality. No acute osseous findings. CT ABDOMEN PELVIS FINDINGS Hepatobiliary: Somewhat ill-defined, hypodense lesion within the central liver measures 4.1 x 2.5 cm, previously previously 3.4 x 2.4 cm when measured similarly (series 2, image 57). Numerous small cysts scattered throughout the liver, unchanged. Status post cholecystectomy. Mild postop biliary ductal dilatation. Pancreas: Unremarkable. No pancreatic ductal dilatation or surrounding inflammatory changes. Spleen: Normal in size without significant abnormality. Adrenals/Urinary Tract: Adrenal glands are unremarkable. Kidneys are normal, without renal calculi, solid lesion, or hydronephrosis. Bladder is unremarkable. Stomach/Bowel: Stomach is within normal limits. Appendix appears normal. No evidence of bowel wall thickening, distention, or inflammatory changes. Vascular/Lymphatic: Aortic atherosclerosis. No  enlarged  abdominal or pelvic lymph nodes. Reproductive: Prostate brachytherapy. Other: No abdominal wall hernia or abnormality. No ascites. Musculoskeletal: No acute osseous findings. Unchanged mixed lytic and sclerotic metastasis of the right iliac crest (series 2, image 95). IMPRESSION: 1. Unchanged post treatment appearance of a spiculated mass of the suprahilar left upper lobe. Unchanged consolidation and volume loss of the peripheral left pulmonary apex. Unchanged small satellite nodules in the left upper lobe. Findings consistent with stable treated primary lung malignancy and satellite metastases. 2. Unchanged post treatment appearance of matted soft tissue about the left hilum and AP window. No discretely enlarged mediastinal, hilar, or axillary lymph nodes. Findings are consistent with stable treated nodal metastatic disease 3. Somewhat ill-defined, hypodense lesion within the central liver measures 4.1 x 2.5 cm, previously 3.4 x 2.4 cm when measured similarly. Findings are consistent with worsened hepatic metastatic disease. 4. Unchanged mixed lytic and sclerotic metastasis of the right iliac crest. No new osseous metastases. 5. 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 6. Prostate brachytherapy. Aortic Atherosclerosis (ICD10-I70.0). Electronically Signed   By: Jearld Lesch M.D.   On: 02/16/2023 15:29     Assessment and plan- Patient is a 82 y.o. male male with metastatic EGFR positive lung cancer with progression on Tagrisso.  He is s/p 2 cycles of carbo Alimta chemotherapy and here to discuss CT scan results and further management    I have reviewed CT chest abdomen pelvis images independently and discussed findings with the patient which overall shows stable appearance of left upper lobe lung mass mediastinal adenopathy.  There was mild increase in the size of his liver lesion from 3.4 cm to 4.1 cm.  Concern for possible new metastatic spot involving the  lateral right eighth rib.  Overall disease burden appears similar.  He has not tolerated carbo Alimta chemotherapy well.  We discussed following options moving forward  Proceeding with best supportive care/hospice Continuing supportive IV fluids and visits at the cancer center with repeat scans in 3 months.  Depending on his performance status and scan results we can decide how to proceed.  Patient would like to proceed with option 2.  He will receive IV fluids today and we will bring him back weekly for fluids.  I will see him in 1 month with labs  I will also reach out to interventional radiology to see if he would be a candidate for liver directed therapy for the enlarging liver lesion     Visit Diagnosis 1. Goals of care, counseling/discussion   2. Primary cancer of left upper lobe of lung (HCC)      Dr. Owens Shark, MD, MPH [facility] [phone] 03/02/2023 12:30 PM

## 2023-03-04 ENCOUNTER — Other Ambulatory Visit: Payer: No Typology Code available for payment source

## 2023-03-04 ENCOUNTER — Ambulatory Visit: Payer: No Typology Code available for payment source | Admitting: Oncology

## 2023-03-09 ENCOUNTER — Inpatient Hospital Stay: Payer: Medicare Other

## 2023-03-09 ENCOUNTER — Other Ambulatory Visit: Payer: Self-pay | Admitting: Oncology

## 2023-03-09 VITALS — BP 119/76 | HR 80 | Temp 96.6°F | Resp 20

## 2023-03-09 DIAGNOSIS — R5383 Other fatigue: Secondary | ICD-10-CM

## 2023-03-09 DIAGNOSIS — Z5111 Encounter for antineoplastic chemotherapy: Secondary | ICD-10-CM | POA: Diagnosis not present

## 2023-03-09 MED ORDER — HEPARIN SOD (PORK) LOCK FLUSH 100 UNIT/ML IV SOLN
500.0000 [IU] | Freq: Once | INTRAVENOUS | Status: AC
  Administered 2023-03-09: 500 [IU] via INTRAVENOUS
  Filled 2023-03-09: qty 5

## 2023-03-09 MED ORDER — SODIUM CHLORIDE 0.9 % IV SOLN
INTRAVENOUS | Status: DC
  Filled 2023-03-09 (×2): qty 250

## 2023-03-09 MED ORDER — SODIUM CHLORIDE 0.9% FLUSH
10.0000 mL | Freq: Once | INTRAVENOUS | Status: AC
  Administered 2023-03-09: 10 mL via INTRAVENOUS
  Filled 2023-03-09: qty 10

## 2023-03-11 ENCOUNTER — Encounter: Payer: Self-pay | Admitting: Oncology

## 2023-03-14 NOTE — Telephone Encounter (Signed)
Jared Mullen- can you help out?

## 2023-03-15 ENCOUNTER — Other Ambulatory Visit: Payer: Self-pay | Admitting: Hospice and Palliative Medicine

## 2023-03-15 DIAGNOSIS — C3412 Malignant neoplasm of upper lobe, left bronchus or lung: Secondary | ICD-10-CM

## 2023-03-15 NOTE — Progress Notes (Signed)
Referral to hospice 

## 2023-03-15 NOTE — Telephone Encounter (Signed)
I spoke with patient's daughter French Ana.  She confirmed the patient is declining and family would like to proceed with hospice at home.  Will coordinate referral to [facility].

## 2023-03-16 ENCOUNTER — Encounter: Payer: Medicare Other | Admitting: Family Medicine

## 2023-03-16 ENCOUNTER — Inpatient Hospital Stay: Payer: Medicare Other

## 2023-03-16 VITALS — BP 115/78 | HR 78 | Temp 96.6°F | Resp 20

## 2023-03-16 DIAGNOSIS — C3412 Malignant neoplasm of upper lobe, left bronchus or lung: Secondary | ICD-10-CM

## 2023-03-16 DIAGNOSIS — Z5111 Encounter for antineoplastic chemotherapy: Secondary | ICD-10-CM | POA: Diagnosis not present

## 2023-03-16 LAB — BASIC METABOLIC PANEL
Anion gap: 8 (ref 5–15)
BUN: 18 mg/dL (ref 8–23)
CO2: 29 mmol/L (ref 22–32)
Calcium: 9.3 mg/dL (ref 8.9–10.3)
Chloride: 99 mmol/L (ref 98–111)
Creatinine, Ser: 0.7 mg/dL (ref 0.61–1.24)
GFR, Estimated: >60 mL/min (ref >60)
Glucose, Bld: 141 mg/dL — ABNORMAL HIGH (ref 70–99)
Potassium: 3.5 mmol/L (ref 3.5–5.1)
Sodium: 136 mmol/L (ref 135–145)

## 2023-03-16 MED ORDER — SODIUM CHLORIDE 0.9% FLUSH
10.0000 mL | Freq: Once | INTRAVENOUS | Status: AC | PRN
  Administered 2023-03-16: 10 mL
  Filled 2023-03-16: qty 10

## 2023-03-16 MED ORDER — SODIUM CHLORIDE 0.9 % IV SOLN
INTRAVENOUS | Status: DC
  Filled 2023-03-16: qty 250

## 2023-03-16 MED ORDER — HEPARIN SOD (PORK) LOCK FLUSH 100 UNIT/ML IV SOLN
500.0000 [IU] | Freq: Once | INTRAVENOUS | Status: AC | PRN
  Administered 2023-03-16: 500 [IU]
  Filled 2023-03-16: qty 5

## 2023-03-23 ENCOUNTER — Inpatient Hospital Stay: Payer: No Typology Code available for payment source

## 2023-03-23 ENCOUNTER — Telehealth: Payer: Self-pay | Admitting: Oncology

## 2023-03-23 NOTE — Telephone Encounter (Signed)
Patient's daughter called to report that patient has been admitted to hospice care. She wanted to send her thanks to her dad's treatment team for compassionate, high quality care. I let her know to please call if they need anything. All upcoming appointments have been cancelled.

## 2023-03-30 ENCOUNTER — Ambulatory Visit: Payer: No Typology Code available for payment source

## 2023-03-30 ENCOUNTER — Ambulatory Visit: Payer: No Typology Code available for payment source | Admitting: Oncology

## 2023-03-30 ENCOUNTER — Other Ambulatory Visit: Payer: No Typology Code available for payment source

## 2023-04-14 ENCOUNTER — Other Ambulatory Visit: Payer: Self-pay | Admitting: Hospice and Palliative Medicine

## 2023-04-14 MED ORDER — MORPHINE SULFATE (CONCENTRATE) 10 MG /0.5 ML PO SOLN: 5.0000 mg | ORAL | 0 refills | Status: DC | PRN

## 2023-04-14 MED ORDER — TAMSULOSIN HCL 0.4 MG PO CAPS: 0.4000 mg | ORAL_CAPSULE | Freq: Every day | ORAL | 2 refills | Status: DC

## 2023-04-14 NOTE — Progress Notes (Signed)
Received a call from hospice nurse, Leana Gamer.  Reportedly, patient is having urinary retention.  On-call hospice RN was unable to successfully place a catheter last night due to increased size of prostate.  Recommended trial of the coud catheter.  Will start patient on Flomax.  Patient is reportedly having pain and hospice requested morphine elixir on hand for comfort.  Rx sent to pharmacy

## 2023-05-19 DEATH — deceased
